# Patient Record
Sex: Female | Born: 1955 | Race: Black or African American | Hispanic: No | Marital: Single | Smoking: Current every day smoker
Health system: Southern US, Community
[De-identification: ages and names within clinical notes are randomized; demographics above are authoritative.]

## PROBLEM LIST (undated history)

## (undated) DIAGNOSIS — M1990 Unspecified osteoarthritis, unspecified site: Secondary | ICD-10-CM

## (undated) DIAGNOSIS — D649 Anemia, unspecified: Secondary | ICD-10-CM

## (undated) DIAGNOSIS — I10 Essential (primary) hypertension: Secondary | ICD-10-CM

## (undated) DIAGNOSIS — M810 Age-related osteoporosis without current pathological fracture: Secondary | ICD-10-CM

## (undated) DIAGNOSIS — Z5189 Encounter for other specified aftercare: Secondary | ICD-10-CM

## (undated) DIAGNOSIS — J45909 Unspecified asthma, uncomplicated: Secondary | ICD-10-CM

## (undated) DIAGNOSIS — I639 Cerebral infarction, unspecified: Secondary | ICD-10-CM

## (undated) DIAGNOSIS — F32A Depression, unspecified: Secondary | ICD-10-CM

## (undated) DIAGNOSIS — I712 Thoracic aortic aneurysm, without rupture: Secondary | ICD-10-CM

## (undated) DIAGNOSIS — Z780 Asymptomatic menopausal state: Secondary | ICD-10-CM

## (undated) DIAGNOSIS — K2980 Duodenitis without bleeding: Secondary | ICD-10-CM

## (undated) DIAGNOSIS — R569 Unspecified convulsions: Secondary | ICD-10-CM

## (undated) DIAGNOSIS — E7800 Pure hypercholesterolemia, unspecified: Secondary | ICD-10-CM

## (undated) DIAGNOSIS — F411 Generalized anxiety disorder: Secondary | ICD-10-CM

## (undated) DIAGNOSIS — F329 Major depressive disorder, single episode, unspecified: Secondary | ICD-10-CM

## (undated) DIAGNOSIS — R011 Cardiac murmur, unspecified: Secondary | ICD-10-CM

## (undated) HISTORY — DX: Major depressive disorder, single episode, unspecified: F32.9

## (undated) HISTORY — DX: Encounter for other specified aftercare: Z51.89

## (undated) HISTORY — DX: Cardiac murmur, unspecified: R01.1

## (undated) HISTORY — DX: Anemia, unspecified: D64.9

## (undated) HISTORY — DX: Unspecified asthma, uncomplicated: J45.909

## (undated) HISTORY — DX: Unspecified osteoarthritis, unspecified site: M19.90

## (undated) HISTORY — DX: Essential (primary) hypertension: I10

## (undated) HISTORY — DX: Unspecified convulsions: R56.9

## (undated) HISTORY — DX: Pure hypercholesterolemia, unspecified: E78.00

## (undated) HISTORY — DX: Depression, unspecified: F32.A

## (undated) HISTORY — DX: Duodenitis without bleeding: K29.80

## (undated) HISTORY — DX: Generalized anxiety disorder: F41.1

## (undated) HISTORY — PX: VASCULAR SURGERY: SHX849

## (undated) HISTORY — DX: Age-related osteoporosis without current pathological fracture: M81.0

## (undated) HISTORY — DX: Asymptomatic menopausal state: Z78.0

## (undated) HISTORY — DX: Thoracic aortic aneurysm, without rupture: I71.2

---

## 1999-07-08 ENCOUNTER — Other Ambulatory Visit: Admission: RE | Admit: 1999-07-08 | Discharge: 1999-07-08 | Payer: Self-pay | Admitting: Obstetrics and Gynecology

## 2000-06-28 ENCOUNTER — Encounter: Payer: Self-pay | Admitting: Rheumatology

## 2000-06-28 ENCOUNTER — Encounter: Admission: RE | Admit: 2000-06-28 | Discharge: 2000-06-28 | Payer: Self-pay | Admitting: Rheumatology

## 2001-05-09 ENCOUNTER — Other Ambulatory Visit: Admission: RE | Admit: 2001-05-09 | Discharge: 2001-05-09 | Payer: Self-pay | Admitting: Obstetrics and Gynecology

## 2001-05-15 ENCOUNTER — Encounter: Payer: Self-pay | Admitting: Obstetrics and Gynecology

## 2001-05-15 ENCOUNTER — Encounter: Admission: RE | Admit: 2001-05-15 | Discharge: 2001-05-15 | Payer: Self-pay | Admitting: Obstetrics and Gynecology

## 2001-07-31 ENCOUNTER — Encounter: Payer: Self-pay | Admitting: Endocrinology

## 2001-07-31 ENCOUNTER — Ambulatory Visit (HOSPITAL_COMMUNITY): Admission: RE | Admit: 2001-07-31 | Discharge: 2001-07-31 | Payer: Self-pay | Admitting: Endocrinology

## 2002-07-14 ENCOUNTER — Emergency Department (HOSPITAL_COMMUNITY): Admission: EM | Admit: 2002-07-14 | Discharge: 2002-07-14 | Payer: Self-pay | Admitting: Emergency Medicine

## 2003-02-16 DIAGNOSIS — K2980 Duodenitis without bleeding: Secondary | ICD-10-CM

## 2003-02-16 HISTORY — DX: Duodenitis without bleeding: K29.80

## 2003-02-22 LAB — HM COLONOSCOPY

## 2004-01-16 ENCOUNTER — Ambulatory Visit: Payer: Self-pay | Admitting: Gastroenterology

## 2004-01-29 ENCOUNTER — Ambulatory Visit: Payer: Self-pay | Admitting: Gastroenterology

## 2004-01-29 HISTORY — PX: ESOPHAGOGASTRODUODENOSCOPY: SHX1529

## 2005-10-04 ENCOUNTER — Ambulatory Visit: Payer: Self-pay | Admitting: Endocrinology

## 2005-11-08 ENCOUNTER — Ambulatory Visit: Payer: Self-pay | Admitting: Endocrinology

## 2005-11-10 ENCOUNTER — Ambulatory Visit: Payer: Self-pay | Admitting: Endocrinology

## 2006-09-02 ENCOUNTER — Emergency Department (HOSPITAL_COMMUNITY): Admission: EM | Admit: 2006-09-02 | Discharge: 2006-09-03 | Payer: Self-pay | Admitting: Emergency Medicine

## 2006-09-06 ENCOUNTER — Ambulatory Visit: Payer: Self-pay | Admitting: Endocrinology

## 2006-10-21 ENCOUNTER — Emergency Department (HOSPITAL_COMMUNITY): Admission: EM | Admit: 2006-10-21 | Discharge: 2006-10-21 | Payer: Self-pay | Admitting: Emergency Medicine

## 2006-10-28 ENCOUNTER — Encounter: Payer: Self-pay | Admitting: Endocrinology

## 2006-10-28 DIAGNOSIS — F411 Generalized anxiety disorder: Secondary | ICD-10-CM | POA: Insufficient documentation

## 2006-10-28 DIAGNOSIS — I10 Essential (primary) hypertension: Secondary | ICD-10-CM

## 2006-10-28 DIAGNOSIS — M069 Rheumatoid arthritis, unspecified: Secondary | ICD-10-CM

## 2006-10-28 DIAGNOSIS — J45909 Unspecified asthma, uncomplicated: Secondary | ICD-10-CM | POA: Insufficient documentation

## 2006-10-28 HISTORY — DX: Unspecified asthma, uncomplicated: J45.909

## 2006-10-28 HISTORY — DX: Generalized anxiety disorder: F41.1

## 2006-10-28 HISTORY — DX: Essential (primary) hypertension: I10

## 2006-10-31 ENCOUNTER — Ambulatory Visit: Payer: Self-pay | Admitting: Endocrinology

## 2006-10-31 LAB — CONVERTED CEMR LAB
ALT: 16 units/L (ref 0–35)
AST: 19 units/L (ref 0–37)
Albumin: 4 g/dL (ref 3.5–5.2)
Alkaline Phosphatase: 82 units/L (ref 39–117)
BUN: 9 mg/dL (ref 6–23)
Bacteria, UA: NEGATIVE
Basophils Absolute: 0 10*3/uL (ref 0.0–0.1)
Basophils Relative: 0.3 % (ref 0.0–1.0)
Bilirubin Urine: NEGATIVE
Bilirubin, Direct: 0.1 mg/dL (ref 0.0–0.3)
CO2: 30 meq/L (ref 19–32)
Calcium: 9.3 mg/dL (ref 8.4–10.5)
Chloride: 110 meq/L (ref 96–112)
Cholesterol: 145 mg/dL (ref 0–200)
Creatinine, Ser: 0.8 mg/dL (ref 0.4–1.2)
Crystals: NEGATIVE
Eosinophils Absolute: 0.1 10*3/uL (ref 0.0–0.6)
Eosinophils Relative: 2 % (ref 0.0–5.0)
GFR calc Af Amer: 97 mL/min
GFR calc non Af Amer: 80 mL/min
Glucose, Bld: 101 mg/dL — ABNORMAL HIGH (ref 70–99)
HCT: 35.8 % — ABNORMAL LOW (ref 36.0–46.0)
HDL: 53 mg/dL (ref >39.0)
Hemoglobin: 12 g/dL (ref 12.0–15.0)
Ketones, ur: NEGATIVE mg/dL
LDL Cholesterol: 83 mg/dL (ref 0–99)
Leukocytes, UA: NEGATIVE
Lymphocytes Relative: 31.8 % (ref 12.0–46.0)
MCHC: 33.4 g/dL (ref 30.0–36.0)
MCV: 86.7 fL (ref 78.0–100.0)
Monocytes Absolute: 0.6 10*3/uL (ref 0.2–0.7)
Monocytes Relative: 8.2 % (ref 3.0–11.0)
Mucus, UA: NEGATIVE
Neutro Abs: 4.3 10*3/uL (ref 1.4–7.7)
Neutrophils Relative %: 57.7 % (ref 43.0–77.0)
Nitrite: NEGATIVE
Platelets: 266 10*3/uL (ref 150–400)
Potassium: 3.8 meq/L (ref 3.5–5.1)
RBC / HPF: NONE SEEN
RBC: 4.13 M/uL (ref 3.87–5.11)
RDW: 15.6 % — ABNORMAL HIGH (ref 11.5–14.6)
Sodium: 145 meq/L (ref 135–145)
Specific Gravity, Urine: <=1.005 (ref 1.000–1.03)
TSH: 0.51 microintl units/mL (ref 0.35–5.50)
Total Bilirubin: 0.5 mg/dL (ref 0.3–1.2)
Total CHOL/HDL Ratio: 2.7
Total Protein, Urine: NEGATIVE mg/dL
Total Protein: 7.5 g/dL (ref 6.0–8.3)
Triglycerides: 46 mg/dL (ref 0–149)
Urine Glucose: NEGATIVE mg/dL
Urobilinogen, UA: 0.2 (ref 0.0–1.0)
VLDL: 9 mg/dL (ref 0–40)
WBC: 7.3 10*3/uL (ref 4.5–10.5)
pH: 6 (ref 5.0–8.0)

## 2006-11-07 ENCOUNTER — Ambulatory Visit: Payer: Self-pay

## 2006-11-07 ENCOUNTER — Encounter: Payer: Self-pay | Admitting: Endocrinology

## 2006-11-07 ENCOUNTER — Ambulatory Visit: Payer: Self-pay | Admitting: Internal Medicine

## 2006-11-21 ENCOUNTER — Ambulatory Visit: Payer: Self-pay | Admitting: Internal Medicine

## 2006-12-06 ENCOUNTER — Ambulatory Visit: Payer: Self-pay | Admitting: Cardiothoracic Surgery

## 2006-12-08 ENCOUNTER — Ambulatory Visit: Payer: Self-pay | Admitting: Pulmonary Disease

## 2006-12-09 ENCOUNTER — Ambulatory Visit: Payer: Self-pay | Admitting: Cardiovascular Disease

## 2006-12-12 ENCOUNTER — Ambulatory Visit (HOSPITAL_COMMUNITY): Admission: RE | Admit: 2006-12-12 | Discharge: 2006-12-12 | Payer: Self-pay | Admitting: Cardiothoracic Surgery

## 2006-12-15 ENCOUNTER — Ambulatory Visit: Payer: Self-pay | Admitting: Cardiothoracic Surgery

## 2006-12-21 ENCOUNTER — Encounter: Payer: Self-pay | Admitting: Endocrinology

## 2006-12-26 ENCOUNTER — Ambulatory Visit: Payer: Self-pay | Admitting: Pulmonary Disease

## 2006-12-27 ENCOUNTER — Ambulatory Visit: Payer: Self-pay | Admitting: Cardiovascular Disease

## 2006-12-27 LAB — CONVERTED CEMR LAB
BUN: 16 mg/dL (ref 6–23)
Basophils Absolute: 0.2 10*3/uL — ABNORMAL HIGH (ref 0.0–0.1)
Basophils Relative: 2.1 % — ABNORMAL HIGH (ref 0.0–1.0)
CO2: 29 meq/L (ref 19–32)
Calcium: 9.2 mg/dL (ref 8.4–10.5)
Chloride: 110 meq/L (ref 96–112)
Creatinine, Ser: 0.7 mg/dL (ref 0.4–1.2)
Eosinophils Absolute: 0.2 10*3/uL (ref 0.0–0.6)
Eosinophils Relative: 2.2 % (ref 0.0–5.0)
GFR calc Af Amer: 113 mL/min
GFR calc non Af Amer: 93 mL/min
Glucose, Bld: 91 mg/dL (ref 70–99)
HCT: 35.9 % — ABNORMAL LOW (ref 36.0–46.0)
Hemoglobin: 12.3 g/dL (ref 12.0–15.0)
INR: 0.9 (ref 0.8–1.0)
Lymphocytes Relative: 22.1 % (ref 12.0–46.0)
MCHC: 34.2 g/dL (ref 30.0–36.0)
MCV: 85.3 fL (ref 78.0–100.0)
Monocytes Absolute: 0.8 10*3/uL — ABNORMAL HIGH (ref 0.2–0.7)
Monocytes Relative: 9.6 % (ref 3.0–11.0)
Neutro Abs: 5.4 10*3/uL (ref 1.4–7.7)
Neutrophils Relative %: 64 % (ref 43.0–77.0)
Platelets: 259 10*3/uL (ref 150–400)
Potassium: 3.8 meq/L (ref 3.5–5.1)
Prothrombin Time: 11.5 s (ref 10.9–13.3)
RBC: 4.21 M/uL (ref 3.87–5.11)
RDW: 15.1 % — ABNORMAL HIGH (ref 11.5–14.6)
Sodium: 144 meq/L (ref 135–145)
WBC: 8.5 10*3/uL (ref 4.5–10.5)
aPTT: 36 s — ABNORMAL HIGH (ref 21.7–29.8)

## 2006-12-29 ENCOUNTER — Ambulatory Visit: Payer: Self-pay | Admitting: Cardiovascular Disease

## 2006-12-29 ENCOUNTER — Inpatient Hospital Stay (HOSPITAL_BASED_OUTPATIENT_CLINIC_OR_DEPARTMENT_OTHER): Admission: RE | Admit: 2006-12-29 | Discharge: 2006-12-29 | Payer: Self-pay | Admitting: Cardiovascular Disease

## 2007-01-06 ENCOUNTER — Ambulatory Visit: Payer: Self-pay | Admitting: Pulmonary Disease

## 2007-01-23 ENCOUNTER — Ambulatory Visit: Payer: Self-pay | Admitting: Cardiovascular Disease

## 2007-01-26 ENCOUNTER — Ambulatory Visit: Payer: Self-pay | Admitting: Cardiothoracic Surgery

## 2007-02-14 ENCOUNTER — Ambulatory Visit: Payer: Self-pay | Admitting: Cardiothoracic Surgery

## 2007-03-16 ENCOUNTER — Ambulatory Visit: Payer: Self-pay | Admitting: Cardiothoracic Surgery

## 2007-03-30 ENCOUNTER — Emergency Department (HOSPITAL_COMMUNITY): Admission: EM | Admit: 2007-03-30 | Discharge: 2007-03-30 | Payer: Self-pay | Admitting: Emergency Medicine

## 2007-04-07 ENCOUNTER — Encounter: Payer: Self-pay | Admitting: Endocrinology

## 2007-04-08 ENCOUNTER — Observation Stay (HOSPITAL_COMMUNITY): Admission: EM | Admit: 2007-04-08 | Discharge: 2007-04-11 | Payer: Self-pay | Admitting: Emergency Medicine

## 2007-04-10 ENCOUNTER — Encounter (INDEPENDENT_AMBULATORY_CARE_PROVIDER_SITE_OTHER): Payer: Self-pay | Admitting: Cardiovascular Disease

## 2007-04-13 ENCOUNTER — Ambulatory Visit: Payer: Self-pay | Admitting: Cardiothoracic Surgery

## 2007-04-24 ENCOUNTER — Encounter: Payer: Self-pay | Admitting: Endocrinology

## 2007-05-01 ENCOUNTER — Encounter: Payer: Self-pay | Admitting: Endocrinology

## 2007-05-05 ENCOUNTER — Encounter: Payer: Self-pay | Admitting: Endocrinology

## 2007-05-12 ENCOUNTER — Encounter: Payer: Self-pay | Admitting: Endocrinology

## 2007-05-25 ENCOUNTER — Ambulatory Visit: Payer: Self-pay | Admitting: Endocrinology

## 2007-05-25 DIAGNOSIS — L299 Pruritus, unspecified: Secondary | ICD-10-CM | POA: Insufficient documentation

## 2007-05-29 ENCOUNTER — Ambulatory Visit: Payer: Self-pay | Admitting: Pulmonary Disease

## 2007-05-29 DIAGNOSIS — J449 Chronic obstructive pulmonary disease, unspecified: Secondary | ICD-10-CM | POA: Insufficient documentation

## 2007-05-29 DIAGNOSIS — J4489 Other specified chronic obstructive pulmonary disease: Secondary | ICD-10-CM | POA: Insufficient documentation

## 2007-06-02 ENCOUNTER — Ambulatory Visit: Payer: Self-pay | Admitting: Cardiovascular Disease

## 2007-06-08 ENCOUNTER — Ambulatory Visit: Payer: Self-pay | Admitting: Cardiothoracic Surgery

## 2007-06-08 ENCOUNTER — Encounter: Admission: RE | Admit: 2007-06-08 | Discharge: 2007-06-08 | Payer: Self-pay | Admitting: Cardiothoracic Surgery

## 2007-06-09 ENCOUNTER — Encounter: Payer: Self-pay | Admitting: Endocrinology

## 2007-07-06 ENCOUNTER — Encounter: Payer: Self-pay | Admitting: Endocrinology

## 2007-07-08 ENCOUNTER — Encounter: Payer: Self-pay | Admitting: Endocrinology

## 2007-07-28 ENCOUNTER — Encounter: Payer: Self-pay | Admitting: Endocrinology

## 2007-09-28 ENCOUNTER — Ambulatory Visit: Payer: Self-pay | Admitting: Cardiothoracic Surgery

## 2007-10-27 ENCOUNTER — Encounter: Payer: Self-pay | Admitting: Endocrinology

## 2008-01-29 ENCOUNTER — Telehealth (INDEPENDENT_AMBULATORY_CARE_PROVIDER_SITE_OTHER): Payer: Self-pay | Admitting: *Deleted

## 2008-01-30 ENCOUNTER — Telehealth: Payer: Self-pay | Admitting: Endocrinology

## 2008-04-01 ENCOUNTER — Ambulatory Visit: Payer: Self-pay | Admitting: Endocrinology

## 2008-04-01 DIAGNOSIS — E7800 Pure hypercholesterolemia, unspecified: Secondary | ICD-10-CM

## 2008-04-01 DIAGNOSIS — R569 Unspecified convulsions: Secondary | ICD-10-CM

## 2008-04-01 DIAGNOSIS — I712 Thoracic aortic aneurysm, without rupture, unspecified: Secondary | ICD-10-CM

## 2008-04-01 HISTORY — DX: Unspecified convulsions: R56.9

## 2008-04-01 HISTORY — DX: Thoracic aortic aneurysm, without rupture, unspecified: I71.20

## 2008-04-01 HISTORY — DX: Thoracic aortic aneurysm, without rupture: I71.2

## 2008-04-01 HISTORY — DX: Pure hypercholesterolemia, unspecified: E78.00

## 2008-05-31 ENCOUNTER — Ambulatory Visit: Payer: Self-pay | Admitting: Endocrinology

## 2008-06-06 LAB — CONVERTED CEMR LAB
ALT: 9 units/L (ref 0–35)
AST: 18 units/L (ref 0–37)
Albumin: 3.6 g/dL (ref 3.5–5.2)
Alkaline Phosphatase: 101 units/L (ref 39–117)
BUN: 11 mg/dL (ref 6–23)
Basophils Absolute: 0 10*3/uL (ref 0.0–0.1)
Basophils Relative: 0 % (ref 0.0–3.0)
Bilirubin Urine: NEGATIVE
Bilirubin, Direct: 0.1 mg/dL (ref 0.0–0.3)
CO2: 23 meq/L (ref 19–32)
Calcium: 9.6 mg/dL (ref 8.4–10.5)
Chloride: 109 meq/L (ref 96–112)
Cholesterol: 159 mg/dL (ref 0–200)
Creatinine, Ser: 0.8 mg/dL (ref 0.4–1.2)
Eosinophils Absolute: 0.1 10*3/uL (ref 0.0–0.7)
Eosinophils Relative: 1.1 % (ref 0.0–5.0)
GFR calc non Af Amer: 95.94 mL/min (ref >60)
Glucose, Bld: 92 mg/dL (ref 70–99)
HCT: 34.8 % — ABNORMAL LOW (ref 36.0–46.0)
HDL: 67.6 mg/dL (ref >39.00)
Hemoglobin: 11.4 g/dL — ABNORMAL LOW (ref 12.0–15.0)
Ketones, ur: NEGATIVE mg/dL
LDL Cholesterol: 79 mg/dL (ref 0–99)
Leukocytes, UA: NEGATIVE
Lymphocytes Relative: 21 % (ref 12.0–46.0)
Lymphs Abs: 1.8 10*3/uL (ref 0.7–4.0)
MCHC: 32.7 g/dL (ref 30.0–36.0)
MCV: 77.6 fL — ABNORMAL LOW (ref 78.0–100.0)
Monocytes Absolute: 0.5 10*3/uL (ref 0.1–1.0)
Monocytes Relative: 5.3 % (ref 3.0–12.0)
Neutro Abs: 6.4 10*3/uL (ref 1.4–7.7)
Neutrophils Relative %: 72.6 % (ref 43.0–77.0)
Nitrite: NEGATIVE
Platelets: 236 10*3/uL (ref 150.0–400.0)
Potassium: 4 meq/L (ref 3.5–5.1)
RBC: 4.49 M/uL (ref 3.87–5.11)
RDW: 16.7 % — ABNORMAL HIGH (ref 11.5–14.6)
Sodium: 146 meq/L — ABNORMAL HIGH (ref 135–145)
Specific Gravity, Urine: 1.02 (ref 1.000–1.030)
TSH: 0.64 microintl units/mL (ref 0.35–5.50)
Total Bilirubin: 0.5 mg/dL (ref 0.3–1.2)
Total CHOL/HDL Ratio: 2
Total Protein, Urine: 100 mg/dL
Total Protein: 8 g/dL (ref 6.0–8.3)
Triglycerides: 64 mg/dL (ref 0.0–149.0)
Urine Glucose: NEGATIVE mg/dL
Urobilinogen, UA: 0.2 (ref 0.0–1.0)
VLDL: 12.8 mg/dL (ref 0.0–40.0)
WBC: 8.8 10*3/uL (ref 4.5–10.5)
pH: 7 (ref 5.0–8.0)

## 2008-06-09 ENCOUNTER — Encounter: Payer: Self-pay | Admitting: Endocrinology

## 2008-06-26 ENCOUNTER — Encounter: Payer: Self-pay | Admitting: Endocrinology

## 2008-06-28 ENCOUNTER — Encounter: Payer: Self-pay | Admitting: Cardiovascular Disease

## 2008-06-28 ENCOUNTER — Encounter: Payer: Self-pay | Admitting: Endocrinology

## 2009-03-27 ENCOUNTER — Encounter: Admission: RE | Admit: 2009-03-27 | Discharge: 2009-03-27 | Payer: Self-pay | Admitting: General Practice

## 2009-07-04 ENCOUNTER — Encounter: Payer: Self-pay | Admitting: Cardiovascular Disease

## 2009-07-04 ENCOUNTER — Encounter: Payer: Self-pay | Admitting: Endocrinology

## 2009-08-28 ENCOUNTER — Emergency Department (HOSPITAL_COMMUNITY): Admission: EM | Admit: 2009-08-28 | Discharge: 2009-08-28 | Payer: Self-pay | Admitting: Emergency Medicine

## 2009-09-16 ENCOUNTER — Ambulatory Visit: Payer: Self-pay | Admitting: Endocrinology

## 2009-09-16 ENCOUNTER — Encounter: Payer: Self-pay | Admitting: Endocrinology

## 2009-09-16 LAB — CONVERTED CEMR LAB
ALT: 13 units/L (ref 0–35)
AST: 20 units/L (ref 0–37)
Albumin: 3.8 g/dL (ref 3.5–5.2)
Alkaline Phosphatase: 95 units/L (ref 39–117)
BUN: 10 mg/dL (ref 6–23)
Basophils Absolute: 0 10*3/uL (ref 0.0–0.1)
Basophils Relative: 0.4 % (ref 0.0–3.0)
Bilirubin Urine: NEGATIVE
Bilirubin, Direct: 0.1 mg/dL (ref 0.0–0.3)
CO2: 25 meq/L (ref 19–32)
Calcium: 9.2 mg/dL (ref 8.4–10.5)
Chloride: 111 meq/L (ref 96–112)
Cholesterol: 141 mg/dL (ref 0–200)
Creatinine, Ser: 0.6 mg/dL (ref 0.4–1.2)
Eosinophils Absolute: 0.2 10*3/uL (ref 0.0–0.7)
Eosinophils Relative: 2.2 % (ref 0.0–5.0)
GFR calc non Af Amer: 135.69 mL/min (ref >60)
Glucose, Bld: 73 mg/dL (ref 70–99)
HCT: 35.5 % — ABNORMAL LOW (ref 36.0–46.0)
HDL: 61 mg/dL (ref >39.00)
Hemoglobin: 11.9 g/dL — ABNORMAL LOW (ref 12.0–15.0)
Ketones, ur: NEGATIVE mg/dL
LDL Cholesterol: 69 mg/dL (ref 0–99)
Leukocytes, UA: NEGATIVE
Lymphocytes Relative: 30.6 % (ref 12.0–46.0)
Lymphs Abs: 2.5 10*3/uL (ref 0.7–4.0)
MCHC: 33.7 g/dL (ref 30.0–36.0)
MCV: 81.4 fL (ref 78.0–100.0)
Monocytes Absolute: 0.5 10*3/uL (ref 0.1–1.0)
Monocytes Relative: 5.9 % (ref 3.0–12.0)
Neutro Abs: 5 10*3/uL (ref 1.4–7.7)
Neutrophils Relative %: 60.9 % (ref 43.0–77.0)
Nitrite: NEGATIVE
Platelets: 203 10*3/uL (ref 150.0–400.0)
Potassium: 3.8 meq/L (ref 3.5–5.1)
RBC: 4.36 M/uL (ref 3.87–5.11)
RDW: 16.1 % — ABNORMAL HIGH (ref 11.5–14.6)
Sodium: 144 meq/L (ref 135–145)
Specific Gravity, Urine: >=1.03 (ref 1.000–1.030)
TSH: 0.56 microintl units/mL (ref 0.35–5.50)
Total Bilirubin: 0.2 mg/dL — ABNORMAL LOW (ref 0.3–1.2)
Total CHOL/HDL Ratio: 2
Total Protein, Urine: 100 mg/dL
Total Protein: 7.6 g/dL (ref 6.0–8.3)
Triglycerides: 57 mg/dL (ref 0.0–149.0)
Urine Glucose: NEGATIVE mg/dL
Urobilinogen, UA: 0.2 (ref 0.0–1.0)
VLDL: 11.4 mg/dL (ref 0.0–40.0)
WBC: 8.2 10*3/uL (ref 4.5–10.5)
pH: 6 (ref 5.0–8.0)

## 2009-10-17 ENCOUNTER — Ambulatory Visit: Payer: Self-pay | Admitting: Endocrinology

## 2009-10-17 DIAGNOSIS — D649 Anemia, unspecified: Secondary | ICD-10-CM

## 2009-10-17 HISTORY — DX: Anemia, unspecified: D64.9

## 2009-11-18 ENCOUNTER — Ambulatory Visit: Payer: Self-pay | Admitting: Endocrinology

## 2010-02-15 ENCOUNTER — Emergency Department (HOSPITAL_COMMUNITY)
Admission: EM | Admit: 2010-02-15 | Discharge: 2010-02-15 | Payer: Self-pay | Source: Home / Self Care | Admitting: Emergency Medicine

## 2010-02-18 ENCOUNTER — Ambulatory Visit
Admission: RE | Admit: 2010-02-18 | Discharge: 2010-02-18 | Payer: Self-pay | Source: Home / Self Care | Attending: Endocrinology | Admitting: Endocrinology

## 2010-02-18 DIAGNOSIS — M25569 Pain in unspecified knee: Secondary | ICD-10-CM | POA: Insufficient documentation

## 2010-03-19 NOTE — Letter (Signed)
Summary: [facility]  [facility]   Imported By: Sherian Rein 08/20/2009 14:38:24  _____________________________________________________________________  External Attachment:    Type:   Image     Comment:   External Document

## 2010-03-19 NOTE — Assessment & Plan Note (Signed)
Summary: 1 MO ROV /NWS  #   Vital Signs:  Patient profile:   55 year old female Height:      69 inches (175.26 cm) Weight:      171.50 pounds (77.95 kg) BMI:     25.42 O2 Sat:      99 % Temp:     98 degrees F (36.67 degrees C) Pulse rate:   69 / minute Pulse rhythm:   regular BP sitting:   160 / 92  (right arm) Cuff size:   regular  Vitals Entered By: Jarome Lamas (October 17, 2009 9:28 AM) CC: 1 mo f/up/pt no longer taking tylenol extra strenght or tramcinolone/pb   Primary Provider:  ellison  CC:  1 mo f/up/pt no longer taking tylenol extra strenght or tramcinolone/pb.  History of Present Illness: htn:  she takes and tolerates bp meds well.   anemia:  was again noted on recent labs.  she does not want to have labs drawn today.    Current Medications (verified): 1)  Lasix 40 Mg  Tabs (Furosemide) .... Take 1 By Mouth Qd 2)  Multivitamins   Tabs (Multiple Vitamin) .... Take 1 By Mouth Qd 3)  Tylenol Extra Strength 500 Mg  Tabs (Acetaminophen) .... 2 Tabs Every 4-6hrs As Needed 4)  Keppra 1000 Mg  Tabs (Levetiracetam) .... Bid 5)  Triamcinolone Acetonide 0.1 %  Crea (Triamcinolone Acetonide) .... Three Times A Day As Needed Itching 6)  Cvs Aspirin Ec 81 Mg  Tbec (Aspirin) .... Once Daily 7)  Simvastatin 40 Mg Tabs (Simvastatin) .Marland Kitchen.. 1 Tab At Bedtime 8)  Amlodipine Besylate 5 Mg Tabs (Amlodipine Besylate) .Marland Kitchen.. 1 Once Daily  Allergies (verified): No Known Drug Allergies  Past History:  Past Medical History: Last updated: 04/01/2008 Smoker Menopause @ age 57 THORACIC AORTIC ANEURYSM (ICD-441.2) C O P D (ICD-496) PRURITUS (ICD-698.9) RHEUMATOID ARTHRITIS (ICD-714.0) HYPERTENSION (ICD-401.9) ASTHMA (ICD-493.90) ANXIETY (ICD-300.00)  Review of Systems  The patient denies hematochezia and hematuria.    Physical Exam  General:  normal appearance.   Extremities:  trace right pedal edema and trace left pedal edema.     Impression & Recommendations:  Problem  # 1:  HYPERTENSION (ICD-401.9) needs increased rx  Problem # 2:  ANEMIA (ICD-285.9) Assessment: New  Medications Added to Medication List This Visit: 1)  Losartan Potassium 50 Mg Tabs (Losartan potassium) .Marland Kitchen.. 1 tab once daily  Other Orders: Est. Patient Level III (14782)  Patient Instructions: 1)  here are some tests for blood in the bowels.  please follow the instructions, and return them to the lab. 2)  add losartan 50 mg once daily. 3)  Please schedule a physical appointment in 1 month. Prescriptions: LOSARTAN POTASSIUM 50 MG TABS (LOSARTAN POTASSIUM) 1 tab once daily  #30 x 11   Entered and Authorized by:   Minus Breeding MD   Signed by:   Minus Breeding MD on 10/17/2009   Method used:   [facility] [phone][facility] (retail)       [address]       [place], [place]  [address]       Ph: [phone] or [phone]       Fax: [phone]   RxID:   [id number]

## 2010-03-19 NOTE — Letter (Signed)
[facility]: [facility]   Imported By: Marylou Mccoy 09/09/2009 14:19:11  _____________________________________________________________________  External Attachment:    Type:   Image     Comment:   External Document

## 2010-03-19 NOTE — Assessment & Plan Note (Signed)
Summary: ER FU/ HAD FLUID ON KNEE/ NWS   Vital Signs:  Patient profile:   55 year old female Height:      69 inches (175.26 cm) Weight:      177 pounds (80.45 kg) BMI:     26.23 O2 Sat:      97 % on Room air Temp:     99.5 degrees F (37.50 degrees C) oral Pulse rate:   95 / minute BP sitting:   142 / 72  (right arm) Cuff size:   regular  Vitals Entered By: Brenton Grills CMA Duncan Dull) (February 18, 2010 11:36 AM)  O2 Flow:  Room air CC: ER F/U/ L knee pain, fluid on left knee/aj Is Patient Diabetic? No   Primary Provider:  Amayra Kiedrowski  CC:  ER F/U/ L knee pain and fluid on left knee/aj.  History of Present Illness: pt states 2 weeks of left knee pain, not in the context of an injury.  she was seen in er, where arthrocentesis was done, and vicodin was rx'ed.  since then, pain is slightly improved overall, but persists.    Current Medications (verified): 1)  Lasix 40 Mg  Tabs (Furosemide) .... Take 1 By Mouth Qd 2)  Multivitamins   Tabs (Multiple Vitamin) .... Take 1 By Mouth Qd 3)  Keppra 1000 Mg  Tabs (Levetiracetam) .Marland Kitchen.. 1 Tab Two Times A Day 4)  Cvs Aspirin Ec 81 Mg  Tbec (Aspirin) .... Once Daily 5)  Simvastatin 40 Mg Tabs (Simvastatin) .Marland Kitchen.. 1 Tab At Bedtime 6)  Amlodipine Besylate 5 Mg Tabs (Amlodipine Besylate) .Marland Kitchen.. 1 Once Daily 7)  Losartan Potassium 50 Mg Tabs (Losartan Potassium) .Marland Kitchen.. 1 Tab Once Daily  Allergies (verified): No Known Drug Allergies  Past History:  Past Medical History: Last updated: 04/01/2008 Smoker Menopause @ age 36 THORACIC AORTIC ANEURYSM (ICD-441.2) C O P D (ICD-496) PRURITUS (ICD-698.9) RHEUMATOID ARTHRITIS (ICD-714.0) HYPERTENSION (ICD-401.9) ASTHMA (ICD-493.90) ANXIETY (ICD-300.00)  Review of Systems  The patient denies fever.    Physical Exam  General:  normal appearance.   Msk:  left knee: there is moderate swelling slight tenderness, but no warmth or erythema.  there a an area of ecchymosis at the arthrocentesis site, 1 cm  diameter, medically Additional Exam:  (i reviewed er labs)   Impression & Recommendations:  Problem # 1:  KNEE PAIN, LEFT (ICD-719.46) Assessment Unchanged uncertain etiology  Medications Added to Medication List This Visit: 1)  Oxycodone-acetaminophen 5-325 Mg Tabs (Oxycodone-acetaminophen) .Marland Kitchen.. 1 every 4 hrs as needed for pain  Other Orders: Orthopedic Surgeon Referral (Ortho Surgeon) Est. Patient Level III [id number])  Patient Instructions: 1)  refer to an orthopedic specialist.  you will be called with a day and time for an appointment. Prescriptions: OXYCODONE-ACETAMINOPHEN 5-325 MG TABS (OXYCODONE-ACETAMINOPHEN) 1 every 4 hrs as needed for pain  #50 x 0   Entered and Authorized by:   Minus Breeding MD   Signed by:   Minus Breeding MD on 02/18/2010   Method used:   Print then Give to Patient   RxID:   [id number]    Orders Added: 1)  Orthopedic Surgeon Referral Gaylord Shih Surgeon] 2)  Est. Patient Level III [[address]]

## 2010-03-19 NOTE — Assessment & Plan Note (Signed)
Summary: FOLOW UP FOR RX REFILL-LB   Vital Signs:  Patient profile:   55 year old female Height:      69 inches (175.26 cm) Weight:      174.38 pounds (79.26 kg) BMI:     25.84 O2 Sat:      99 % on Room air Temp:     97.4 degrees F (36.33 degrees C) oral Pulse rate:   52 / minute BP sitting:   148 / 88  (right arm) Cuff size:   regular  Vitals Entered By: Brenton Grills MA (September 16, 2009 8:39 AM)  O2 Flow:  Room air CC: F/U for rx refills/refills on Lasix and Diltiazem/aj Comments Pt is no longer taking Tylenol Extra strenth or using Triamcinolone cream   Primary Provider:  ellison  CC:  F/U for rx refills/refills on Lasix and Diltiazem/aj.  History of Present Illness: the status of at least 3 ongoing medical problems is addressed today: bradycardia:  she has intermittent dizziness. edema:  pt says this has happend since off the lasix. dyslipidemia:  denies chest pain.  she is still on the zocor  Current Medications (verified): 1)  Lasix 40 Mg  Tabs (Furosemide) .... Take 1 By Mouth Qd 2)  Multivitamins   Tabs (Multiple Vitamin) .... Take 1 By Mouth Qd 3)  Tylenol Extra Strength 500 Mg  Tabs (Acetaminophen) .... 2 Tabs Every 4-6hrs As Needed 4)  Keppra 1000 Mg  Tabs (Levetiracetam) .... Bid 5)  Triamcinolone Acetonide 0.1 %  Crea (Triamcinolone Acetonide) .... Three Times A Day As Needed Itching 6)  Cvs Aspirin Ec 81 Mg  Tbec (Aspirin) .... Once Daily 7)  Diltiazem Hcl Er Beads 420 Mg Xr24h-Cap (Diltiazem Hcl Er Beads) .... Take 1 By Mouth Once Daily Physical Is Due No Addtional Refills Until Appt 8)  Simvastatin 80 Mg Tabs (Simvastatin) .... Qhs  Allergies (verified): No Known Drug Allergies  Past History:  Past Medical History: Last updated: 04/01/2008 Smoker Menopause @ age 77 THORACIC AORTIC ANEURYSM (ICD-441.2) C O P D (ICD-496) PRURITUS (ICD-698.9) RHEUMATOID ARTHRITIS (ICD-714.0) HYPERTENSION (ICD-401.9) ASTHMA (ICD-493.90) ANXIETY  (ICD-300.00)  Social History: Reviewed history from 05/31/2008 and no changes required. smoker works [employer]. single  Review of Systems       The patient complains of weight gain.  The patient denies syncope.    Physical Exam  General:  normal appearance.   Lungs:  Clear to auscultation bilaterally. Normal respiratory effort.  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 1 tab at bedtime 2)  Amlodipine Besylate 5 Mg Tabs (Amlodipine besylate) .Marland Kitchen.. 1 once daily  Other Orders: EKG w/ Interpretation (93000) TLB-Lipid Panel (80061-LIPID) TLB-BMP (Basic Metabolic Panel-BMET) (80048-METABOL) TLB-CBC Platelet - w/Differential (85025-CBCD) TLB-Hepatic/Liver Function Pnl (80076-HEPATIC) TLB-TSH (Thyroid Stimulating Hormone) (84443-TSH) TLB-Udip w/ Micro (81001-URINE) Est. Patient Level IV ([address])  Patient Instructions: 1)  blood tests are being ordered for you today.  please call [phone] to hear your test results. 2)  change diltiazem to amlodipine 5 mg once daily. 3)  reduce simvastatin to 40 mg once daily.  4)  Please schedule a physical appointment in 1 month. Prescriptions: AMLODIPINE BESYLATE 5 MG TABS (AMLODIPINE BESYLATE) 1 once daily  #30 x 11   Entered and Authorized by:   Minus Breeding MD   Signed by:   Minus Breeding MD on 09/16/2009   Method used:   [facility] [phone]* (retail)       [address], [place]  [address]       Ph: [phone] or [phone]       Fax: [phone]   RxID:   [id number] SIMVASTATIN 40 MG TABS (SIMVASTATIN) 1 tab at  bedtime  #30 x 11   Entered and Authorized by:   Minus Breeding MD   Signed by:   Minus Breeding MD on 09/16/2009   Method used:   [facility]        [facility] [phone]* (retail)       [address]       [place], [place]  [address]       Ph: [phone] or [phone]       Fax: [phone]   RxID:   [id number]

## 2010-03-19 NOTE — Assessment & Plan Note (Signed)
Summary: 1 MO ROV /NWS #   Vital Signs:  Patient profile:   55 year old female Height:      69 inches Weight:      175.75 pounds O2 Sat:      99 % Temp:     98.1 degrees F oral Pulse rate:   60 / minute BP sitting:   130 / 80  (right arm)  Vitals Entered By: Jarome Lamas (November 18, 2009 11:14 AM) CC: 3 month fl/up/pb   Primary Debanhi Blaker:  ellison  CC:  3 month fl/up/pb.  History of Present Illness: here for regular wellness examination.  she's feeling pretty well in general, and does not drink etoh.     Allergies: No Known Drug Allergies  Past History:  Past Medical History: Last updated: 04/01/2008 Smoker Menopause @ age 49 THORACIC AORTIC ANEURYSM (ICD-441.2) C O P D (ICD-496) PRURITUS (ICD-698.9) RHEUMATOID ARTHRITIS (ICD-714.0) HYPERTENSION (ICD-401.9) ASTHMA (ICD-493.90) ANXIETY (ICD-300.00)  Family History: Reviewed history from 05/31/2008 and no changes required. mother had breast cancer.  Social History: Reviewed history from 05/31/2008 and no changes required. smoker works [employer]. single  Review of Systems  The patient denies fever, weight loss, weight gain, vision loss, decreased hearing, chest pain, syncope, dyspnea on exertion, prolonged cough, headaches, abdominal pain, melena, hematochezia, severe indigestion/heartburn, hematuria, suspicious skin lesions, and depression.    Physical Exam  General:  normal appearance.   Head:  head: no deformity eyes: no periorbital swelling, no proptosis external nose and ears are normal mouth: no lesion seen Neck:  Supple without thyroid enlargement or tenderness.  Chest Wall:  there is a median sternotomy scar Breasts:  sees gyn  Lungs:  Clear to auscultation bilaterally. Normal respiratory effort.  Heart:  Regular rate and rhythm without gallops noted. Normal S1,S2.  there is a 3/5 syst murmur Abdomen:  abdomen is soft, nontender.  no hepatosplenomegaly.   not distended.  no  hernia  Rectal:  sees gyn  Genitalia:  sees gyn  Msk:  muscle bulk and strength are grossly normal.  no obvious joint swelling.  gait is normal and steady  Pulses:  dorsalis pedis intact bilat.  no carotid bruit  Extremities:  no deformity.  no ulcer on the feet.  feet are of normal color and temp.  no edema  Neurologic:  cn 2-12 grossly intact.   readily moves all 4's.   sensation is intact to touch on the feet  Skin:  normal texture and temp.  no visible rash.  not diaphoretic  Cervical Nodes:  No significant adenopathy.  Psych:  Alert and cooperative; normal mood and affect; normal attention span and concentration.     Impression & Recommendations:  Problem # 1:  ROUTINE GENERAL MEDICAL EXAM@HEALTH  CARE FACL (ICD-V70.0)  Medications Added to Medication List This Visit: 1)  Keppra 1000 Mg Tabs (Levetiracetam) .Marland Kitchen.. 1 tab two times a day  Other Orders: Est. Patient 40-64 years ([address])  Preventive Care Screening     gyn is dr stringer   Patient Instructions: 1)  please consider these measures for your health:  minimize alcohol.  do not use tobacco products.  have a colonoscopy at least every 10 years from age 12.  keep firearms safely stored.  always use seat belts.  have working smoke alarms in your home.  see an eye doctor and dentist regularly.  never drive under the influence of alcohol or drugs (including prescription drugs).   2)  please let me know what your  wishes would be, if artificial life support measures should become necessary.  it is critically important to prevent falling down (keep floor areas well-lit, dry, and free of loose objects). 3)  Please schedule a follow-up appointment in 6 months.

## 2010-03-19 NOTE — Letter (Signed)
[facility]   Imported By: Sherian Rein 08/20/2009 14:40:05  _____________________________________________________________________  External Attachment:    Type:   Image     Comment:   External Document

## 2010-03-19 NOTE — Letter (Signed)
Summary: [facility]  [facility]   Imported By: Marylou Mccoy 09/09/2009 14:19:48  _____________________________________________________________________  External Attachment:    Type:   Image     Comment:   External Document

## 2010-04-27 LAB — SYNOVIAL CELL COUNT + DIFF, W/ CRYSTALS
Crystals, Fluid: NONE SEEN
Eosinophils-Synovial: 0 % (ref 0–1)
Lymphocytes-Synovial Fld: 6 % (ref 0–20)
Monocyte-Macrophage-Synovial Fluid: 3 % — ABNORMAL LOW (ref 50–90)
Neutrophil, Synovial: 91 % — ABNORMAL HIGH (ref 0–25)

## 2010-04-27 LAB — PROTEIN, BODY FLUID: Total protein, fluid: 4.9 g/dL

## 2010-04-27 LAB — BODY FLUID CULTURE: Culture: NO GROWTH

## 2010-04-27 LAB — GLUCOSE, SEROUS FLUID: Glucose, Fluid: 85 mg/dL

## 2010-04-27 LAB — GRAM STAIN

## 2010-05-03 LAB — BLOOD GAS, ARTERIAL
Acid-base deficit: 0.7 mmol/L (ref 0.0–2.0)
Bicarbonate: 23 mEq/L (ref 20.0–24.0)
Drawn by: 257701
FIO2: 0.21 %
O2 Saturation: 97.3 %
Patient temperature: 98.6
TCO2: 20.9 mmol/L (ref 0–100)
pCO2 arterial: 36.6 mmHg (ref 35.0–45.0)
pH, Arterial: 7.416 — ABNORMAL HIGH (ref 7.350–7.400)
pO2, Arterial: 86.3 mmHg (ref 80.0–100.0)

## 2010-05-21 ENCOUNTER — Encounter: Payer: Self-pay | Admitting: Endocrinology

## 2010-05-21 ENCOUNTER — Ambulatory Visit (INDEPENDENT_AMBULATORY_CARE_PROVIDER_SITE_OTHER): Payer: 59 | Admitting: Endocrinology

## 2010-05-21 VITALS — BP 140/80 | HR 72 | Temp 98.0°F | Ht 69.0 in | Wt 168.0 lb

## 2010-05-21 DIAGNOSIS — I10 Essential (primary) hypertension: Secondary | ICD-10-CM

## 2010-05-21 DIAGNOSIS — I712 Thoracic aortic aneurysm, without rupture: Secondary | ICD-10-CM

## 2010-05-21 NOTE — Patient Instructions (Addendum)
Your next physical is due in 6 months. Please continue th same medications.

## 2010-05-21 NOTE — Progress Notes (Signed)
  Subjective:    Patient ID: Diana Walters, female    DOB: [birth year], 55 y.o.   MRN: [id number]  HPI pt states she feels well in general.  She is down to 2 cigarettes per day.     Review of Systems Denies sob    Objective:   Physical Exam GENERAL: no distress Ext:  No edema.        Assessment & Plan:  Htn, with situational component.

## 2010-06-30 NOTE — Cardiovascular Report (Signed)
NAMEJAXON, Diana Walters NO.:  [id number]   MEDICAL RECORD NO.:  [id number]          PATIENT TYPE:  OIB   LOCATION:  1961                         FACILITY:  [facility]   PHYSICIAN:  Peter C. Eden Emms, MD, FACCDATE OF BIRTH:  Apr 28, 1955   DATE OF PROCEDURE:  12/29/2006  DATE OF DISCHARGE:                            CARDIAC CATHETERIZATION   PROCEDURE:  Coronary arteriography.   INDICATIONS:  Ascending and descending thoracic aneurysm, presurgical  evaluation of coronary arteries.   DESCRIPTION OF PROCEDURE:  Cine catheterization was done using 4-French  catheters from right femoral artery.  The JL six catheter was used to  engage the left main.   FINDINGS:  1. Left main coronary artery was normal.  2. Left anterior descending artery was normal.  3. First and second diagonal branches were normal.  4. Circumflex coronary artery was codominant and normal.  5. The right coronary artery was codominant and normal.   RAO VENTRICULOGRAPHY:  RAO Ventriculography was normal.  EF was 55%-60%.  There was no gradient across the aortic valve and no MR.  Aortic  pressure was 140/50, LV pressure was 140/10 with a post A-wave EDP of  20.   Ascending aortic root aortography showed severe dilatation of the  ascending aorta with significant calcification.  The arch, itself,  actually seemed to be the thinnest area of the aorta   Descending thoracic aortography showed that the patient also had severe  dilatation of the descending thoracic aorta down to the level of the  diaphragm, at which point, the aorta resumed normal caliber.   IMPRESSION:  The patient has no significant coronary artery disease with  good LV function.  She will need a staged procedure likely with  ascending aortic root replacement with an elephant trunk followed by  descending thoracic aortic repair, further evaluation of the arch  vessels may be needed with CT scanning prior to surgery.   She will follow up  with Dr. Tyrone Sage to discuss her surgery further.      [name]. Eden Emms, MD, [facility]  Electronically Signed    PCN/MEDQ  D:  12/29/2006  T:  12/29/2006  Job:  [id number]   cc:   Sheliah Plane, MD

## 2010-06-30 NOTE — Assessment & Plan Note (Signed)
[facility]                            CARDIOLOGY OFFICE NOTE   JEFF, MCCALLUM                       MRN:          [id number]  DATE:12/27/2006                            DOB:          Nov 27, 1955    Diana Walters returns today for followup.  The last time I saw her she was  quite emotional.  She has an ascending thoracic aortic aneurysm.   It measures 5.5 x 5.6 cm.  There is also a question of descending  thoracic aortic aneurysm, and the patient may need an elephant trunk  procedure.   I had a discussion with Dr. Tyrone Sage who has seen her.  He would like to  proceed with right and left heart catheterization with an aortic root  injection, and then schedule the patient for followup and probable  surgery.  The patient has a better understanding of her problem.  Her  blood pressure seems to be better controlled.  The last time I saw her,  I started her on carvedilol 12.5 b.i.d.   Her review of systems, otherwise, remarkable for no syncope, no chest  pain, no PND or orthopnea, no shortness of breath.   Her current medications include:  1. Lasix 40 a day.  2. Diovan 160/12.5 two tablets a day.  3. Multivitamins.  4. Carvedilol 12.5 b.i.d.   [facility] [address]   Her current exam is a remarkable for a much more calm, middle-aged black  female in no distress.  Weight is 143, blood pressure is 120/70, pulse is 68 and regular.  HEENT:  Unremarkable.  Carotids are normal without bruit, no lymphadenopathy or thyromegaly or  JVP elevation.  LUNGS:  Clear with good diaphragmatic motion, no wheezing.  She,  unfortunately, continues to smoke.  There is an S1, S2 with a systolic ejection murmur.  There is no aortic  insufficiency murmur.  Her PMI is normal.  ABDOMEN:  Benign.  Bowel sounds positive, no triple A,  hepatosplenomegaly, hepatojugular reflux, no tenderness.  NEURO:  Nonfocal.  No muscular weakness.  SKIN:  Warm and dry.   Baseline EKG shows sinus rhythm with limb lead voltage for left  ventricular hypertrophy.   IMPRESSION:  1. Ascending and descending thoracic aortic aneurysm.  Possible need      for staged procedure depending on arch vessel morphology.  Right      and left heart catheterization to be done with aortic root.  I will      try to arrange this with one of my partners on Thursday or Friday      in the [facility] lab.  She will then [facility]  2. Hypertension, currently better controlled, continue Diovan and      carvedilol.  3. Smoking.  The patient understands the importance of quitting      smoking, particularly prior to open heart surgery.  She will get      baseline pulmonary function tests and continue her attempts.  4. Anxiety, currently much better controlled.  Continue p.r.n. valium.  5. Fibromyalgia, stable,  previously on Lyrica, but this has been      stopped.  Follow with Dr. Kellie Simmering from rheumatology.   I believe the patient is also on Humira, or has been in the past.  I  will have to look into the immunosuppressive mechanism of this agent,  but I do not think it would delay any of her thoracic surgery.     [name]. Eden Emms, MD, [facility]  Electronically Signed    PCN/MedQ  DD: 12/27/2006  DT: 12/28/2006  Job #: [id number]

## 2010-06-30 NOTE — Assessment & Plan Note (Signed)
OFFICE VISIT   Diana Walters, Diana Walters  DOB:  Jan 05, 1956                                        March 16, 2007  CHART #:  [id number]   Diana Walters has been considered for replacement of ascending aorta and  descending aorta and arch.   When she was last seen she was agreeable with consultation with Dr.  _Hughes____at [facility] for consideration of replacement of the ascending  aorta and stent grafting at descending aorta.  The information of her  cath and CT scan had been sent to [facility].  The patient comes in today.  Had not received any information back or an appointment to be seen.  She  continues to be asymptomatic from this. She has remained off her  methotrexate and has not been smoking for almost 8 weeks.   PHYSICAL EXAMINATION:  VITAL SIGNS:  Her blood pressure is 142/67, pulse  is 77, respiratory rate is 18, O2 saturation is 99%.  LUNGS:  Clear.  CARDIAC EXAM:  Is unchanged.  I do not appreciate a murmur of aortic  insufficiency.   While the patient was in the office we did contact Dr. ____Hughes__  office.  A package for her appointment had been mailed to her. She had  not received it. She does have an appointment on February the 20th.  I  will plan to see her after that, depending on his recommendations.   Sheliah Plane, MD  Electronically Signed   EG/MEDQ  D:  03/16/2007  T:  03/16/2007  Job:  [id number]

## 2010-06-30 NOTE — Assessment & Plan Note (Signed)
OFFICE VISIT   Diana Walters, Diana Walters  DOB:  [birth year]                                        January 26, 2007  CHART #:  [id number]   Diana Walters returns to the office today in followup after her previous  consultation and evaluation for diffuse thoracic aortic disease.  She  has a question about bicuspid aortic valve without significant aortic  insufficiency.  Dilated ascending aorta to 5.5 to 5.6.  Dilated  descending aorta to just above the diaphragm in the 4.4 range.  Patient  has known rheumatoid arthritis.  She has been on methotrexate in the  past.  This is being held because of a consideration for proceeding with  ascending aortic and arch replacement and possible stent graft placement  as a followup to the descending aorta.  The patient has been a heavy  smoker.  On her previous visit, she has been encouraged to stop.  Today,  she notes that she stopped smoking 3 days ago.  Overall, the idea of  surgery, she is more comfortable with.  She was not nearly as upset as  she was on previous visits.   PHYSICAL EXAMINATION:  On examination today, her blood pressure is  156/68, pulse is 62, respiratory rate is 18, O2 Sats 99%.  Her lungs are  without wheezing.  Cardiac exam is unchanged.  She has no pedal edema.   I I have again reviewed the CT scans; recent cardiac catheterization,  which shows no significant coronary artery disease.   I discussed in detail, the staged procedure.  I have encouraged her to  continue not smoking.  I have also discussed with her the magnitude of  the operations and risks involved.  I have offered her the option of a  referral to the [facility] in [place] for surgery.  She does not  really wish to pursue this, though was willing to have me send films for  outside review.  I will plan  this.  She will continue on her efforts not to smoke.  I will see her  back in 2 to 3 weeks and consider proceeding with surgery in late  January or early February.   Sheliah Plane, MD  Electronically Signed   EG/MEDQ  D:  01/26/2007  T:  01/26/2007  Job:  [id number]   cc:   [name]. Eden Emms, MD, [facility]

## 2010-06-30 NOTE — Discharge Summary (Signed)
Diana Walters, JEWEL NO.:  [id number]   MEDICAL RECORD NO.:  [id number]          PATIENT TYPE:  OBV   LOCATION:  3738                         FACILITY:  [facility]   PHYSICIAN:  Ricki Rodriguez, M.D.  DATE OF BIRTH:  Aug 24, 1955   DATE OF ADMISSION:  04/08/2007  DATE OF DISCHARGE:  04/11/2007                               DISCHARGE SUMMARY   FINAL DIAGNOSES:  1. Syncope.  2. Sinus bradycardia.  3. Hypokalemia.  4. Ascending aortic aneurysm.  5. Tobacco use disorder.  6. Anxiety.   DISCHARGE MEDICATIONS:  Cardizem 360 mg one daily, Lasix 20 mg daily,  Diovan HCT 160/12.5 mg daily, Xanax 0.25 mg twice daily, Carvedilol  3.125 mg daily, Vicodin 5/500 mg one 3 times daily, potassium 10 mEq one  twice daily, and aspirin 325 mg one daily.   DISCHARGE DIET:  Low-sodium heart-healthy diet.   ACTIVITY:  The patient to walk with assistance.   SPECIAL INSTRUCTION:  The patient to stop any activity that causes chest  pain, shortness of breath, dizziness, sweating, or excessive weakness.   Followup with Dr. Orpah Cobb in 1 month.  The patient to call [phone]  for an appointment and by [facility] as arranged.   HISTORY:  This 55 year old black female presented with passing out spell  after missing a breakfast and lunch.  The patient denies any chest pain,  weakness, speech problem, or visual disturbance.   PHYSICAL EXAMINATION:  VITAL SIGNS:  Temperature 98.1, pulse 85,  respirations 16, blood pressure 158/78, and oxygen saturation 99%.  GENERAL:  The patient is averagely built and nourished.  HEENT: The patient is normocephalic and atraumatic with Parcell eyes.  Pupils equally reactive to light.  Extraocular movements intact.  NECK:  No JVD.  LUNGS:  Clear.  HEART:  Normal.  S1 and S2.  ABDOMEN:  Soft.  EXTREMITIES:  No edema, cyanosis, or clubbing.  SKIN:  Warm and dry.  NEUROLOGIC:  The patient moves all four extremities.   LABORATORY DATA:  Normal  hemoglobin/hematocrit, WBC count, and platelet  count.  Near normal electrolytes, BUN, creatinine, and glucose.  Myoglobin, CK-MB, and troponin I near normal.  EKG revealed sinus  bradycardia.  Echocardiogram revealed normal LV systolic function with a  mildly calcific aortic valve and mild aortic valve regurgitation, mild  aortic root dilatation with a moderate ascending aorta dilatation.   HOSPITAL COURSE:  The patient was admitted to telemetry.  Her  medications initially were continued when she had her severe  bradyarrhythmias.  Her medication dosages were decreased.  She had 2-D  echocardiogram that failed to show any significant valvular disease to  account for a syncope.  After adjusting her medication doses, the  patient's heart rate improved and on April 11, 2007, she was  discharged home in satisfactory condition with instructions to not to  miss meals and take medications regularly.      Ricki Rodriguez, M.D.  Electronically Signed     ASK/MEDQ  D:  05/18/2007  T:  05/19/2007  Job:  [id number]

## 2010-06-30 NOTE — Assessment & Plan Note (Signed)
OFFICE VISIT   TAMBER, BURTCH  DOB:  1955/03/24                                        December 15, 2006  CHART #:  [id number]   The patient was originally seen in the office at the request of Dr.  Everardo All on December 06, 2006, and after reviewing her echo and CT scan  with the aorta dilated to 5.5 to 5.6, I discussed with her the need for  ascending aortic replacement and possibly in the future stenting or  surgical repair of the descending aorta.  The patient has been a long-  term smoker, and, at that time, she was encouraged to stop smoking.  In  addition, we made her a referral to [name] for consideration  of cardiac catheterization, and she was seen by Dr. Eden Emms.  I also have  discussed the case with Dr. Vassie Loll from pulmonary who has seen her and had  arranged for her to have pulmonary function studies done which were  completed.  The patient returns to the office today for further  discussion of her aortic problem and to review the current findings.  Unfortunately, she is still smoking but notes that she has markedly  decreased, and after discussion today, she and her daughter both say  that she will not smoke anymore.   PHYSICAL EXAMINATION:  VITAL SIGNS:  On exam today, her blood pressure  is 125/65, pulse 58, respiratory rate 18, O2 saturation 99%.  LUNGS:  Clear.  HEART:  I do not appreciate any murmur of aortic insufficiency.  She has no pedal edema.   She has been on both prednisone and Humira in the past but not currently  on these.  She is on chronic methotrexate.  The pulmonary function  studies were performed and showed an FEV1 of 2, MVD which is normal and  diffusion capacity which is high for measured volumes.   She has a return appointment to see Dr. Eden Emms, and she will discuss  with him that she wishes to proceed with aortic sugery sometime in the .  near future, and we will plan to have him perform cardiac  catheterization with attention to the ascending and arch.  After this  has been scheduled, she is to notify my office so I can review the  films, and we will see her in the office following catheterization to  further go over the exact surgical procedure needed and timing of  surgery.  The  patient is much more relaxed than she was on the first visit and is  willing to proceed with treatment plan.   Sheliah Plane, MD  Electronically Signed   EG/MEDQ  D:  12/15/2006  T:  12/16/2006  Job:  [id number]   cc:   Aundra Dubin, M.D.  Sean A. Everardo All, MD  [name] Eden Emms, MD, [facility]

## 2010-06-30 NOTE — Consult Note (Signed)
NEW PATIENT CONSULTATION   Smeltzer, Laquonda L  DOB:  09-Nov-1955                                        December 06, 2006  CHART #:  [id number]   PRIMARY CARE PHYSICIAN:  Gregary Signs A. Everardo All, M.D.   RHEUMATOLOGIST:  Aundra Dubin, M.D.   REASON FOR CONSULTATION:  Ascending and descending aortic aneurysm.   BRIEF HISTORY:  Patient comes to the office today after having a CT scan  of the chest and being referred by Dr. Everardo All.  She notes that she had  gone in for a regular checkup and was told that she had some scarring of  the lungs and a CT scan was ordered.  Subsequently, she was told that  she had something wrong with her heart, an echocardiogram was done.  She  was referred to [facility] pulmonology, but the appointment was canceled  and she was referred to cardiac surgery for evaluation of findings based  on the CT scan of the chest.   The patient has had no previous cardiac history.  She denies anginal or  chest pain.  She does note one visit, several months ago, to the  emergency room, when she had a syncopal episode, was told she had low  blood sugar and was discharged home.  She has a long history of treated  hypertension.  Denies hyperlipidemia, denies diabetes, has had no  previous stroke.   The patient's father was murdered.  The patient's mother died at age 72  of breast cancer and congestive heart failure.  She  is an only child.   PAST MEDICAL HISTORY:  Significant for an eight to nine-year history of  rheumatoid arthritis, treated with various drugs, including methotrexate  and Humira currently.  She had been on prednisone 20 mg a day and was  stopped by Dr. Kellie Simmering two to three months ago.  She denies any previous  surgery.   SOCIAL HISTORY:  Patient is employed as a Merchandiser, retail at [employer].  She  lives alone.  She is a smoker and has smoked at least one pack a day  since the death of her mother, 16 years ago.   MEDICATIONS:  Medications listed  include:  1. Lasix 40 mg a day.  2. Cardizem extended release 420 mg a day.  3. Diovan/HCT 160/12.5 two tablets once a day.  4. Humira 40 mg every two weeks.  5. Methotrexate 2.5 mg tablets three q.a.m. and three q.p.m.   ALLERGIES:  None.   REVIEW OF SYSTEMS:  CARDIAC:  Negative for chest pain, resting shortness  of breath, exertional shortness of breath, orthopnea, presyncope.  She  has had a syncopal episode, as noted above.  Denies palpitations and  lower extremity edema.  GENERAL REVIEW OF SYSTEMS:  The patient's weight has been stable.  Denies any vision changes.  Denies chest pain, palpitations.  Denies  wheezing or hemoptysis or dyspnea on exertion.  Denies hematochezia,  dysphagia or melena.  Denies renal stones.  MUSCULOSKELETAL:  Does have diffuse joint pains, especially in the  wrists, knees and ankles.  Has a history of depression.  Denies allergies.  Other review of systems are negative.   Blood pressure today is 148/74, pulse is 88, respiratory rate is 18, O2  sat is 98% on room air.  She is 5 feet 8 inches tall,  weighs 147 pounds.  Patient appears in reasonable health, awake, alert.  Neurologic:  Intact.  Pupils were equal, round and reactive to light.  Neck is  without carotid bruits.  She does have prominent venous engorgement over  the left upper chest.  She has no axillary or cervical lymph nodes.  Lungs are clear bilaterally.  Cardiac exam reveals regular rate and  rhythm without murmur or gallop.  Abdominal exam is benign without  palpable masses.  Lower extremities:  She has tenderness in her left  knee and mild swelling.  She has +2 DP and PT pulses bilaterally.   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  The ascending aorta measures 5.5 by 5.6 cm in  the mid-ascending.  The transverse arch measures 3.9 cm.  The proximal  descending  4.3, increasing to 4.4 by 4.8 in the distal descending aorta  and then just above the diaphragmatic hiatus, tapers to 2.5.  There is a  pulmonary parenchymal pattern, suggestive of interstitial pneumonitis.   Echocardiogram was performed, which showed mildly dilated left  ventricle, left ventricular end diastolic dimension is 40 mm, aortic  root 36 mm, ascending aorta 46 mm.  Aortic valve report raises the issue  of a fused raphe, but then reports the aortic valve was trileaflet.  There is moderate aortic root dilatation, mild ascending aortic  dilatation on the report, although the CT scan shows this to be 5.6 cm.   IMPRESSION:  Patient with a diffusely enlarged aorta from the ascending  aorta, narrowing some in the arch, and then again a significant size  down to just above the hiatus.  I do not detect any significant aortic  insufficiency.  At this point, I do not know if the patient has coronary  artery disease.  With the size of her ascending aorta at just over 5.5  cm, at age 55, it is likely that the patient will require replacement of  the ascending aorta, possibly the arch, with an elephant trunk, and the  descending aorta in the future.  I have reviewed this diagnosis with her  and have made the recommendation that we realign her with a  pulmonologist to check out the possibility of rheumatoid lung disease,  obtain pulmonary function studies.  I have counseled her to immediately  stop smoking and will arrange for her to have a cardiologist.   At this point, the patient is very upset about having a major diagnosis  and was very apprehensive about proceeding with any further treatment.  I have encouraged her to keep her appointments and will plan to see her  back in approximately one week.  She will need a cardiac  catheterization, but will wait and establish rapport with cardiology  before arranging a cardiac catheterization at this point.  She may not  be willing to proceed  with any operation.  However, I have reviewed with  her the risks and dangers of aortic rupture and/or dissection.   Sheliah Plane, MD  Electronically Signed   EG/MEDQ  D:  12/06/2006  T:  12/07/2006  Job:  [id number]   cc:   Aundra Dubin, M.D.  Sean A. Everardo All, MD  Oretha Milch, MD  Pricilla Riffle, MD, [facility]

## 2010-06-30 NOTE — Assessment & Plan Note (Signed)
[facility]                            CARDIOLOGY OFFICE NOTE   Diana Walters, Diana Walters                       MRN:          [id number]  DATE:01/23/2007                            DOB:          [birth year]    [name] returns today for follow up. She has a thoracic aortic aneurysm  measuring over 5.5 cm.  It involves fusiform dilatation of ascending and  descending thoracic aorta. She had her heart catheterized December 29, 2006. She had no significant coronary artery disease. EF was 55-60%.  Post A wave EDP was 20. She had aortography showing dilatation of the  ascending aorta with significant calcification. The arch slimmed back  down, and then she had severe dilatation of the descending thoracic  aorta down to the diaphragm. I had talked about her case with Dr.  Tyrone Sage already. She initially saw him for consultation October 30. She  may need an elephant trunk procedure with staged thoracic aortic repair.  I am not sure why she has not followed up with him post catheterization  to schedule the surgery. The blood pressure has been under good control.  She continues to get occasional episodes of diaphoresis. These seem  nondescript. Her blood pressure is under control. There has been no  excessive bradycardia or hypotension. She has been compliant with her  medications and not having chest pain.   Currently, she is on:  1. Lasix 40 a day.  2. Diovan HCT 160/12.5.  3. Multivitamins.  4. Carvedilol 12.5 b.i.d.  5. Tiazac 420 a day.   Her exam is remarkable for a thin, black female in no distress.  Weight  is 119, blood pressure 140/66, pulse 68 and regular, respiratory rate  14, afebrile.  HEENT:  Unremarkable.  Carotids prominent. No bruits. No lymphadenopathy, thyromegaly or JVP  elevation.  LUNGS:  Clear. Good diaphragmatic motion. No wheezing.  S1/S2. I cannot hear any aortic insufficiency. PMI forceful but not  enlarged.  ABDOMEN:  Is benign.  Abdominal aorta palpable but not enlarged or  tender. No hepatosplenomegaly. No hepatojugular reflux. No bruits.  Distal pulses are intact. No edema. Calf site well healed without  residual bruit or hematoma.  NEUROLOGICAL:  Nonfocal.  SKIN:  Warm and dry.   Baseline EKG shows voltage criteria for LVH.   IMPRESSION:  1. Hypertension. Currently well controlled. Continue current      medicines, low-salt diet.  2. Thoracic aneurysm. Follow with Dr. Tyrone Sage. Surgery should ensue      in the near future. Probably will need a staged procedure with an      ascending thoracic repair leaving an elephant trunk for staged      descending thoracic repair.  3. Previous lower extremity edema, currently improved on current dose      of Lasix and hydrochlorothiazide. Continue low salt diet.  4. History of fibromyalgia and rheumatoid arthritis. Continue p.r.n.      pain relief with nonsteroidals.  5. Smoking. The patient has stopped smoking since her heart      catheterization. She needs to continue to be abstinent. This  will      help her prior to any surgery that she has.   She will talk about this further with Dr. Tyrone Sage as well.     [name]. Eden Emms, MD, [facility]  Electronically Signed    PCN/MedQ  DD: 01/23/2007  DT: 01/24/2007  Job #: [id number]

## 2010-06-30 NOTE — Assessment & Plan Note (Signed)
[facility]                             PULMONARY OFFICE NOTE   Diana Walters, Diana Walters                       MRN:          [id number]  DATE:01/06/2007                            DOB:          1955/09/06    Diana Walters is a 55 year old smoker who has recently been diagnosed with  an ascending aortic aneurysm measuring 5.5 cm x 5.6 cm with dilatation  of the aortic root to 46 mm, and an obstructed left brachiocephalic  vein.  Surgery is being planned for this in the near future.  A CT chest  has shown some interstitial prominence in both lower lobes.  She smoked  about a pack per day, but has recently cut down to about 4 cigarettes  per day.  PFTs have suggested mild restriction with an SVCS 68%.  TLC,  however, was preserved at 89%.  Diffusion capacity was mildly decreased  at 70%, but corrected for alveolar volume.  There was no evidence of  airway obstruction with an FEV1:FVC ratio of 84, and FEV1 of 76%.  Serology in the past has shown a positive RA factor, and a negative ANA,  which is consistent with a known diagnosis of rheumatoid arthritis.  She  has been taken off methotrexate now with the anticipation of surgery.   CURRENT MEDICATIONS:  Include:  1. Lasix 40 mg daily.  2. Diovan HCT 160/12.5 mg daily.  3. Carvedilol 12.5 mg 1/2 tablet daily.  4. Tiazac 40 mg daily.   EXAMINATION:  Weight 154 pounds.  Temperature 97.6.  Blood pressure  124/80.  Heart rate 66 per minute.  Her oxygen saturation is 97% on room  air.  HEENT:  No postnasal drip.  CV:  S1 and S2 normal.  No murmur.  CHEST:  Clear to auscultation.   IMPRESSION:  1. The differential diagnosis of this form of interstitial lung      disease in this smoker would include early manifestation of      rheumatoid lung, methotrexate-induced lung disease, or less likely      smoking-related interstitial lung disease (respiratory      bronchiolitis).  2. Ascending aortic aneurysm.   RECOMMENDATIONS:  1. Smoking cessation was once again emphasized.  Some literature      regarding Chantix was given to her.  She will try to quit prior to      surgery.  2. Her pulmonary function does not contraindicate repair of the      aneurysm.  I think it is too early for me to pursue a biopsy to      determine the reason for her pulmonary fibrosis.  I think      this can be followed by serial imaging and pulmonary function      studies every 6 months or so.     Oretha Milch, MD  Electronically Signed    RVA/MedQ  DD: 01/06/2007  DT: 01/07/2007  Job #: [id number]   cc:   [name]. Eden Emms, MD, [facility]  Sheliah Plane, MD  Aundra Dubin, M.D.

## 2010-06-30 NOTE — Assessment & Plan Note (Signed)
OFFICE VISIT   BRITENY, FULGHUM  DOB:  1955-05-24                                        February 14, 2007  CHART #:  [id number]   Patient return to the office today to further discuss the surgical  options of her treatment for her dilated ascending aorta and descending  aorta.  She stayed off methotrexate in preparation for surgery and has  had no flare-up of joint problems in doing so.  She is now completely  off of cigarettes also.   PHYSICAL EXAMINATION:  Blood pressure is 146/70.  Pulse is 88.  Respiratory rate is 80.  O2 sat is 97%.  She has no pedal edema.  Lungs  are clear bilaterally.  She does have an early systolic murmur.  I do  not appreciate any murmur of aortic insufficiency.   After reviewing the films with my partners, I have discussed with her  consultation with Dr. Kizzie Bane and Dr. Jeanice Lim, for consideration of  ascending arch and stent grafting at one sitting with proximal  introduction of a stent graft.  Patient is willing to visit Dr. Kizzie Bane.  I will call his office and make arrangements for him to see her and  review her films.  Otherwise, I will plan to see her back in 3-4 weeks.   Sheliah Plane, MD  Electronically Signed   EG/MEDQ  D:  02/14/2007  T:  02/15/2007  Job:  [id number]

## 2010-06-30 NOTE — Assessment & Plan Note (Signed)
[facility]                            CARDIOLOGY OFFICE NOTE   Diana, Walters                       MRN:          [id number]  DATE:06/02/2007                            DOB:          10/24/1955    HISTORY:  Diana Walters returns today for followup.  The patient was initially  seen on December 27, 2006 for ascending thoracic aortic aneurysm.  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  She was initially referred to Dr. Tyrone Sage.  He  was concerned that she need may need an elephant trunk procedure and  apparently referred her to Dr. Italy Hughes at [facility].  The patient had the  first initial phase of her surgery successfully.  As far as I can tell,  she had the ascending aortic root and arch replaced with a de-branching  procedure of the great vessels.  She needs to return for presumed graft  stenting of the descending thoracic aorta.   Her postop course was complicated by seizures.  Apparently she had an  intraoperative seizure documented by EEG.  She was started on Keppra.  I  think the patient did have an adult onset seizure about a month prior to  this.  She needs neurological followup.   Apparently she was seen at [facility] on April 08, 2007 to  April 11, 2007 by Dr. Algie Coffer.  I have no idea how this happened.   The patient apparently had a passing out spell after missing breakfast  and lunch.  She did not have any chest pain, weakness, speech problems  or visual disturbances.  We were not involved with her care.  Apparently  telemetry showed no significant problems and her echocardiogram showed  no source of embolus.  I will have to pull the x-rays and echocardiogram  from this hospitalization.   The patient has otherwise been doing well.  She wants to go back to work  at [employer] and I think this is fine.  Of course she will have to be  out again for the second part of her  procedure.  She is to see Dr.  Kizzie Bane next Friday.  From a cardiac perspective, she is otherwise  stable.  She is not having chest pain, PND, orthopnea.  Her breathing is  improved.  She had some small effusions perioperatively and has been on  daily Lasix.  She has not had any palpitations or evidence of AFib.   REVIEW OF SYSTEMS:  Otherwise negative.   MEDICATIONS:  1. Lasix 40 a day.  2. Multivitamins.  3. Cardizem 60 q.6 h.  4. Keppra 1 gram q.12 h.  5. Aspirin a day.  6. Diovan 160 a day.  7. Lipitor 80 a day.   PHYSICAL EXAMINATION:  GENERAL:  Remarkable for much less emotional  black female in no distress.  Affect is appropriate.  VITAL SIGNS:  Weight is 156, blood pressure 150/60, pulse 62 and  regular, afebrile.  HEENT:  Unremarkable.  NECK:  She has a transmitted murmur to the left  carotid.  No  lymphadenopathy, thyromegaly or JVP elevation.  LUNGS:  Clear with good diaphragmatic motion.  No wheezing.  HEART:  There is an S1-S2 with a systolic murmur.  She has had a right  axillary approach scar and a sternotomy scar.  PMI is normal.  ABDOMEN:  Benign.  Bowel sounds are positive.  No AAA.  No tenderness,  no hepatosplenomegaly or hepatojugular reflux.  EXTREMITIES:  Distal pulses are intact, no edema.  NEURO:  Nonfocal.  SKIN:  Warm and dry.  No muscular weakness.   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 Italy Hughes at [facility].  Follow up for      descending thoracic aorta stent graft, currently stable.  Continue      blood pressure medication.  Apparent bradycardia.  Avoid beta      blockers.  2. Recent hospitalizations for syncope.  I do not know if this is      related to bradycardia.  Her Carvedilol has been stopped.  She      needs to follow up with neurology given her history of seizures      intraoperatively and to make sure  Keppra is the right medication      for her.  I suspect she will need an outpatient EEG and will try to      make this referral for her next.  3. Hypertension, currently well controlled.  Continue Diovan 160 a      day.  4. Murmurs.  After her follow up procedure, she will probably need      duplexes of her branch vessels to get a baseline to see how they      have been grafted.  She will also need a follow up echocardiogram      in regards to her systolic murmur.   We do have an echo from the hospital on April 10, 2007 which showed  normal LV function with aortic root dilatation.  No significant aortic  stenosis or regurgitation.   I am glad to see Diana Walters is doing better and that she has had the most  dangerous part of her procedure in regards to operation of the arch.  I  look forward to neurology's input and her second surgery with Dr.  Kizzie Bane.  She will go back to work in the interim to try to keep her  benefits.     [name]. Eden Emms, MD, [facility]  Electronically Signed    PCN/MedQ  DD: 06/02/2007  DT: 06/02/2007  Job #: [id number]

## 2010-06-30 NOTE — Assessment & Plan Note (Signed)
[facility]                            CARDIOLOGY OFFICE NOTE   Diana Walters                       MRN:          [id number]  DATE:12/09/2006                            DOB:          04-Sep-1955    Diana Walters is seen today as a new patient.  She is referred by Dr. Everardo All.   The patient has longstanding hypertension.  She was referred for a 2-D  echocardiogram by Dr. Everardo All for a question of valvular heart disease  and murmur.   I actually read the patient's echocardiogram on November 07, 2006.  There was a question of a bicuspid aortic valve.  There was no  significant stenosis or regurgitation, however, her aortic root was  moderately dilated at 46 mm, and I suggested that the patient have a  followup CT or MRI to size it.  This was performed at our office.   The scan was reviewed by myself personally.  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  It did end before the arch.  The transverse aorta measured approximately 3.9 cm.  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   In talking to [facility], she has not had any significant chest pain.  There  is no history of coronary artery disease.  She has not had syncope,  although she had an episode of low blood sugar on September 5th  requiring a hospital visit.   The patient's coronary risk factors include primarily hypertension and  smoking.  In regards to her hypertension, she has had it for over 20  years.  It appears that at times she has been noncompliant with her  therapy, but is currently on good medical therapy.   Sometimes her blood pressure is exacerbated by prednisone that she takes  for rheumatoid.   The patient says she has stopped smoking since she was told about the  aneurysm.   I spent quite a bit of time with her.  She is very emotional.  She has a  hard time looking a physician in the eye.  She was crying  during a good  part of her interview.  I had a heart model with me, and tried to  explain to her what was going on, the diagnosis, its longterm sequela,  and the fact that she likely would need surgery for it.  I told her that  I would refer her to [facility] once we have an idea of the fact that they  want to do surgery.  I will probably need to proceed with heart  catheterization.  There is a possibility that we can clear her  coronaries with cardiac CTA.   REVIEW OF SYSTEMS:  Otherwise, negative.   PAST MEDICAL HISTORY:  Remarkable for:  1. Rheumatoid arthritis.  2. Smoking.  3. Hypertension.  4. Anxiety.  5. Fibromyalgia.  6. Question of asthma.   NKDA   MEDICATIONS:  Include:  1. Lasix 40 a day.  2. Cardizem 240 a day.  3. Diovan 160/25.  4. Humira 40 mg every 2 weeks.  5. Methotrexate 3.25 mg tablets on Monday, and 4 on Thursday.  6. Multivitamins.   The patient has smoked more than a pack a day for many years.  She said  she has not smoked in the last 2 weeks.   FAMILY HISTORY:  Remarkable for mother dying at age 72 of diabetes and  kidney disease, father dying at age 16, was murdered.   The patient works at [employer].  She is single.  She has 1 daughter, and  2 grandchildren who live in [address].  Otherwise, she does not  have any family.  She is sedentary, and does not work out on a regular  basis.  She has not had previous surgery.   EXAMINATION:  Remarkable for an emotional black female in no distress.  She does have nicotine on her breath still.  Weight is 143.  Blood pressure is 140/72.  Pulse 72 and regular.  Respiratory rate 14.  Afebrile.  HEENT:  Normal.  Carotids are bounding.  There is a transmitted murmur.  JVP is normal.  Neck is supple.  There is no thyromegaly or lymphadenopathy.  LUNGS:  Clear to diaphragmatic motion.  No wheezing.  There is an S1 and S2 with a soft systolic murmur.  I cannot hear an  opening snap.  There is no aortic  insufficiency murmur.  PMI is normal.  ABDOMEN:  Benign.  Abdominal aorta is palpable, but not tender.  No  hepatosplenomegaly.  No hepatojugular reflux.  Femorals are +4 bilaterally.  PTs are +3.  There is no lower extremity  edema.  Her baseline electrocardiogram showed sinus rhythm with LVH.   As indicated, I spent quite some time reviewing her echocardiogram and  CT scan.   IMPRESSION:  1. 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  Refer to [facility].      Recommendations from them afterwards in regards to need for heart      catheterization or possible computed tomography angiography to      clear the coronaries.  2. Hypertension in the setting of ascending aortic aneurysm.  Add      Coreg 12.5 mg p.o. b.i.d.  Strip logged in using Dr. Tiajuana Amass to [facility] and [address].  3. History of rheumatoid arthritis.  Continue methotrexate.  Try to      avoid steroids any time near surgery in regards to weakening smooth      muscle and and __________ further.  4. Smoking.  Continue cessation.  Patient may well benefit from the      addition of Wellbutrin both in terms of her anxiety and depression      over the situation, and stopping her smoking.   Further recommendations will be based on the results of her CVTS  evaluation.     Diana Walters. Eden Emms, MD, [facility]  Electronically Signed    PCN/MedQ  DD: 12/09/2006  DT: 12/10/2006  Job #: [id number]

## 2010-06-30 NOTE — Assessment & Plan Note (Signed)
OFFICE VISIT   CAPRI, VEALS  DOB:  02-01-1956                                        September 28, 2007  CHART #:  [id number]   The patient returns to the office today for followup visit after staged  procedure for ascending and descending aneurysm.  She has subsequently  undergone femoral approach to placement of a thoracic stent graft and is  now back at work and doing well.  She has not returned to smoking and  has stayed off methotrexate as her rheumatoid arthritis has remained  stable.   On exam today, her blood pressure is 147/67, pulse is 68, respiratory  rate is 18, and O2 sats 99%.  Her sternum is stable and well healed.  She does have an early systolic murmur, 2/6.  Lung fields are clear  bilaterally.  Her wounds are all well healed.  She has no pedal edema.   No radiographic studies were done today.  She does see Dr. Kizzie Bane in  September with a followup CT scan.  I have not made a return appointment  to see me as she will continue to be followed by Dr. Kizzie Bane in  Cardiology.   Sheliah Plane, MD  Electronically Signed   EG/MEDQ  D:  09/28/2007  T:  09/29/2007  Job:  [id number]

## 2010-06-30 NOTE — Assessment & Plan Note (Signed)
Diana Walters                             PULMONARY OFFICE NOTE   Diana Walters, Diana Walters                       MRN:          [id number]  DATE:12/08/2006                            DOB:          December 07, 1955    HISTORY OF PRESENT ILLNESS:  Ms. Diana Walters is a 55 year old African-American  smoker who presents for evaluation of an abnormal CT chest.  She had an  ER visit for what seems like a hypoglycemic episode.  This prompted a  cardiac workup which was supposedly negative except for an  echocardiogram showing a large aorta.  Hence, a CT chest was profound,  which showed an aneurysm of the ascending thoracic aorta, measuring 5.5  x 5.6 cm.  There was some interstitial prominence with air trapping in  the lower lobes and ill-defined intralobular nodularity in the upper  lung zones, the reason for the possibility of interstitial lung disease.  Hence, she is referred to [place].   On questioning, she denies dyspnea, cough, wheezing.  There is no  history of abnormal x-ray noted in the past.   PAST MEDICAL HISTORY:  1. Hypertension.  2. Rheumatoid arthritis for the last eight years on methotrexate (Dr.      Kellie Simmering).   PAST SURGICAL HISTORY:  None.   ALLERGIES:  None.   CURRENT MEDICATIONS:  1. Lasix 40 mg daily.  2. Cardizem CD 420 mg daily.  3. Diovan/HCTZ 160/12.5 mg 2 tablets daily.  4. Humira 40 mg every 2 weeks.  5. Methotrexate 2.5 mg 3 tablets on Monday and 4 on Thursday.  6. Multivitamin tablets.   SOCIAL HISTORY:  She smokes about a pack per day for the last 13 years.  She is single and has a grown daughter who is 22.  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   FAMILY HISTORY:  Asthma in her brothers and heart disease in her mother  and sister.   REVIEW OF SYSTEMS:  Occasional dry cough.  Loss of appetite.  Anxiety,  depression.  Her joint symptoms are mostly well controlled.   PHYSICAL EXAMINATION:  Weight 145 pounds.  Blood pressure  132/80, heart  rate 82 per minutes, oxygen saturation 99% on room air.  HEENT:  No postnasal drip.  NECK:  Supple.  No JVD.  No lymphadenopathy.  CVS:  S1 and S2 normal.  CHEST:  Clear to auscultation.  ABDOMEN:  Soft and nontender.  NEUROLOGIC:  Nonfocal.  EXTREMITIES:  No edema.   LAB DATA:  WBC count 10.3, hemoglobin 12, platelets 266.  Sodium 145,  potassium 3.8, BUN and creatinine 9 and 0.8.  LFTs were normal.   IMPRESSION:  1. The CT appearance may be suggestive of early interstitial lung      disease.  The faint ground glass kind of appearance is very      suggestive of respiratory bronchiolitis/desquamative interstitial      pneumonitis.  The differential diagnosis, of course, includes      sarcoidosis and other forms of interstitial lung disease.  She      seems to be  asymptomatic from a pulmonary standpoint.  Recommend      full PFTs, including DLCO, will be obtained to estimate lung      function.  Another differential diagnosis includes rheumatoid      arthritis or methotrexate-induced interstitial lung disease.  2. We will look for desaturation with exercise.  3. She was counseled about smoking cessation.  Clearly, this would be      the primary form of treatment for smoking-related respiratory      bronchiolitis.  4. If the pulmonary function is significantly decreased, we will      proceed with further workup, otherwise we will follow her lung      function every 3-6 months.  5. Aortic aneurysm is being evaluated by CT surgery.     Oretha Milch, MD  Electronically Signed    RVA/MedQ  DD: 12/08/2006  DT: 12/09/2006  Job #: [id number]   cc:   Gregary Signs A. Everardo All, MD  [name] Eden Emms, MD, [facility]

## 2010-06-30 NOTE — Assessment & Plan Note (Signed)
OFFICE VISIT   [name], [name]  DOB:  [birth year]                                        June 08, 2007  CHART #:  [id number]   Ms. Diana Walters returns today for follow-up visit after having surgery at  [facility].  She has been referred for a combined ascending aortic arch and  descending aorta replacement as a single stage.  The first age was done,  and the second stage was deferred until later when from the femoral  approach a thoracic stent graft will be placed.   The patient is doing well postoperatively.  She has had no fever,  chills.  Her wounds were all healing well.  She has returned to work  part-time.  She continues to stay off cigarettes.  Her operative course  was complicated by seizures, so she is not driving currently.  She is to  see Dr. Kizzie Bane at [facility].   I plan to see her back in 4-5 months after her second stage of her  procedure.   Sheliah Plane, MD  Electronically Signed   EG/MEDQ  D:  06/08/2007  T:  06/08/2007  Job:  [id number]

## 2010-06-30 NOTE — Assessment & Plan Note (Signed)
OFFICE VISIT   Diana Walters, Diana Walters  DOB:  09-19-1955                                        April 13, 2007  CHART #:  [id number]   Diana Walters comes in to the office today in followup to make sure her  referral and treatment plan stay on course.  She had been referred to  Dr. Kizzie Bane at [facility] to evaluate her for combined ascending aorta arch  replacement, and stent placement in the descending thoracic aorta.  She  notes that she has seen him, and tentatively planned for surgery March  the 8th or 9th.  No further studies were done today, and she is happy  with her referral.  I will plan to see her back following her surgery to  check on her status.  She is to call and make an appointment after she  has had surgery and back in [place].   Sheliah Plane, MD  Electronically Signed   EG/MEDQ  D:  04/13/2007  T:  04/14/2007  Job:  [id number]

## 2010-10-08 ENCOUNTER — Other Ambulatory Visit: Payer: Self-pay | Admitting: Endocrinology

## 2010-11-06 LAB — PROTIME-INR
INR: 1
Prothrombin Time: 13.3

## 2010-11-06 LAB — POCT CARDIAC MARKERS
CKMB, poc: <1 — ABNORMAL LOW
Myoglobin, poc: 212
Operator id: 257131
Troponin i, poc: 0.26 — ABNORMAL HIGH

## 2010-11-06 LAB — BASIC METABOLIC PANEL
CO2: 28
Chloride: 108
Creatinine, Ser: 0.67
GFR calc Af Amer: >60
Glucose, Bld: 95

## 2010-11-06 LAB — URINALYSIS, ROUTINE W REFLEX MICROSCOPIC
Ketones, ur: 15 — AB
Leukocytes, UA: NEGATIVE
Nitrite: NEGATIVE
Protein, ur: NEGATIVE
Urobilinogen, UA: 0.2

## 2010-11-06 LAB — CARDIAC PANEL(CRET KIN+CKTOT+MB+TROPI)
CK, MB: 1.1
Relative Index: 0.5
Relative Index: 0.5

## 2010-11-06 LAB — RAPID URINE DRUG SCREEN, HOSP PERFORMED
Cocaine: NOT DETECTED
Opiates: NOT DETECTED
Tetrahydrocannabinol: POSITIVE — AB

## 2010-11-06 LAB — DIFFERENTIAL
Basophils Absolute: 0
Eosinophils Absolute: 0.1
Eosinophils Relative: 1
Lymphocytes Relative: 15
Lymphs Abs: 1.1
Monocytes Absolute: 0.3

## 2010-11-06 LAB — CBC
HCT: 38.5
Hemoglobin: 12.5
MCV: 83.2
Platelets: 264
RDW: 15.3

## 2010-11-06 LAB — APTT: aPTT: 36

## 2010-11-06 LAB — I-STAT 8, (EC8 V) (CONVERTED LAB)
BUN: 6
Bicarbonate: 19.8 — ABNORMAL LOW
Chloride: 109
pCO2, Ven: 29.1 — ABNORMAL LOW
pH, Ven: 7.441 — ABNORMAL HIGH

## 2010-11-06 LAB — LIPID PANEL
Cholesterol: 177
LDL Cholesterol: 117 — ABNORMAL HIGH
Triglycerides: 52

## 2010-11-06 LAB — POCT PREGNANCY, URINE
Operator id: 257131
Preg Test, Ur: NEGATIVE

## 2010-11-06 LAB — TSH: TSH: 0.482

## 2010-11-06 LAB — POCT I-STAT CREATININE
Creatinine, Ser: 1
Operator id: 257131

## 2010-11-06 LAB — MAGNESIUM: Magnesium: 2.3

## 2010-11-07 ENCOUNTER — Other Ambulatory Visit: Payer: Self-pay | Admitting: Endocrinology

## 2010-11-17 ENCOUNTER — Telehealth: Payer: Self-pay | Admitting: *Deleted

## 2010-11-17 DIAGNOSIS — Z0000 Encounter for general adult medical examination without abnormal findings: Secondary | ICD-10-CM

## 2010-11-17 NOTE — Telephone Encounter (Signed)
Lab orders placed into Epic for upcoming CPX appointment.  

## 2010-11-26 ENCOUNTER — Other Ambulatory Visit: Payer: 59

## 2010-11-27 ENCOUNTER — Encounter: Payer: Self-pay | Admitting: Endocrinology

## 2010-11-27 ENCOUNTER — Ambulatory Visit (INDEPENDENT_AMBULATORY_CARE_PROVIDER_SITE_OTHER): Payer: 59 | Admitting: Endocrinology

## 2010-11-27 ENCOUNTER — Other Ambulatory Visit (INDEPENDENT_AMBULATORY_CARE_PROVIDER_SITE_OTHER): Payer: 59

## 2010-11-27 VITALS — BP 102/62 | HR 69 | Temp 98.2°F | Ht 69.0 in | Wt 169.0 lb

## 2010-11-27 DIAGNOSIS — Z780 Asymptomatic menopausal state: Secondary | ICD-10-CM | POA: Insufficient documentation

## 2010-11-27 DIAGNOSIS — I10 Essential (primary) hypertension: Secondary | ICD-10-CM

## 2010-11-27 DIAGNOSIS — I712 Thoracic aortic aneurysm, without rupture: Secondary | ICD-10-CM

## 2010-11-27 DIAGNOSIS — F17200 Nicotine dependence, unspecified, uncomplicated: Secondary | ICD-10-CM

## 2010-11-27 DIAGNOSIS — E042 Nontoxic multinodular goiter: Secondary | ICD-10-CM

## 2010-11-27 DIAGNOSIS — Z0000 Encounter for general adult medical examination without abnormal findings: Secondary | ICD-10-CM

## 2010-11-27 DIAGNOSIS — R319 Hematuria, unspecified: Secondary | ICD-10-CM | POA: Insufficient documentation

## 2010-11-27 LAB — URINALYSIS, ROUTINE W REFLEX MICROSCOPIC
Leukocytes, UA: NEGATIVE
Nitrite: NEGATIVE
Specific Gravity, Urine: 1.025 (ref 1.000–1.030)
pH: 6 (ref 5.0–8.0)

## 2010-11-27 LAB — CBC WITH DIFFERENTIAL/PLATELET
Basophils Relative: 0.2 % (ref 0.0–3.0)
Eosinophils Relative: 0.9 % (ref 0.0–5.0)
HCT: 36.5 % (ref 36.0–46.0)
Lymphs Abs: 1.8 10*3/uL (ref 0.7–4.0)
MCV: 81.2 fl (ref 78.0–100.0)
Monocytes Absolute: 0.4 10*3/uL (ref 0.1–1.0)
Platelets: 256 10*3/uL (ref 150.0–400.0)
WBC: 9.4 10*3/uL (ref 4.5–10.5)

## 2010-11-27 LAB — BASIC METABOLIC PANEL
BUN: 15 mg/dL (ref 6–23)
Calcium: 9.2 mg/dL (ref 8.4–10.5)
GFR: 116.67 mL/min (ref >60.00)
Glucose, Bld: 88 mg/dL (ref 70–99)
Sodium: 144 mEq/L (ref 135–145)

## 2010-11-27 LAB — HEPATIC FUNCTION PANEL: Albumin: 3.6 g/dL (ref 3.5–5.2)

## 2010-11-27 LAB — LDL CHOLESTEROL, DIRECT: Direct LDL: 74.3 mg/dL

## 2010-11-27 LAB — LIPID PANEL
HDL: 68.5 mg/dL (ref >39.00)
Total CHOL/HDL Ratio: 2

## 2010-11-27 NOTE — Patient Instructions (Addendum)
blood tests are being requested for you today.  please call [phone] to hear your blood and ultrasound test results.  You will be prompted to enter the 9-digit "MRN" number that appears at the top left of this page, followed by #.  Then you will hear the message. please consider these measures for your health:  minimize alcohol.  do not use tobacco products.  have a colonoscopy at least every 10 years from age 57.  Women should have an annual mammogram from age 66.  keep firearms safely stored.  always use seat belts.  have working smoke alarms in your home.  see an eye doctor and dentist regularly.  never drive under the influence of alcohol or drugs (including prescription drugs).   please let me know what your wishes would be, if artificial life support measures should become necessary.   Let's check a bone-density test.  A few days later, please call [phone] to hear your test results.  You will be prompted to enter the 9-digit "MRN" number that appears at the top left of this page, followed by #.  Then you will hear the message.   Let's also check an ultrasound of the thyroid.  you will receive a phone call, about a day and time for an appointment.   Smoking is very dangerous.  It causes many health problems, including heart problems, cancer, emphysema, and death.  Please read the paper i am giving you today.  You can get help by calling 1-800-QUIT-NOW.  It is known that smokers who use quit-smoking medication are more successful quitting than those who don't.  It often takes several tries to quit successfully, so those who have tried should try again.   i would be happy to prescribe you medication to help you quit.   Smoking Cessation This document explains the best ways for you to quit smoking and new treatments to help. It lists new medicines that can double or triple your chances of quitting and quitting for good. It also considers ways to avoid relapses and concerns you may have about quitting,  including weight gain. NICOTINE: A POWERFUL ADDICTION If you have tried to quit smoking, you know how hard it can be. It is hard because nicotine is a very addictive drug. For some people, it can be as addictive as heroin or cocaine. Usually, people make 2 or 3 tries, or more, before finally being able to quit. Each time you try to quit, you can learn about what helps and what hurts. Quitting takes hard work and a lot of effort, but you can quit smoking. QUITTING SMOKING IS ONE OF THE MOST IMPORTANT THINGS YOU WILL EVER DO:  You will live longer, feel better, and live better.   The impact on your body of quitting smoking is felt almost immediately:   Within 20 minutes, blood pressure decreases. Pulse returns to its normal level.   After 8 hours, carbon monoxide levels in the blood return to normal. Oxygen level increases.   After 24 hours, chance of heart attack starts to decrease. Breath, hair, and body stop smelling like smoke.   After 48 hours, damaged nerve endings begin to recover. Sense of taste and smell improve.   After 72 hours, the body is virtually free of nicotine. Bronchial tubes relax and breathing becomes easier.   After 2 to 12 weeks, lungs can hold more air. Exercise becomes easier and circulation improves.   Quitting will lower your chance of having a heart attack,  stroke, cancer, or lung disease:   After 1 year, the risk of coronary heart disease is cut in half.   After 5 years, the risk of stroke falls to the same as a nonsmoker.   After 10 years, the risk of lung cancer is cut in half and the risk of other cancers decreases significantly.   After 15 years, the risk of coronary heart disease drops, usually to the level of a nonsmoker.   If you are pregnant, quitting smoking will improve your chances of having a healthy baby.   The people you live with, especially your children, will be healthier.   You will have extra money to spend on things other than  cigarettes.  FIVE KEYS TO QUITTING Studies have shown that these 5 steps will help you quit smoking and quit for good. You have the best chances of quitting if you use them together: 1. Get ready.  2. Get support and encouragement.  3. Learn new skills and behaviors.  4. Get medicine to reduce your nicotine addiction and use it correctly.  5. Be prepared for relapse or difficult situations. Be determined to continue trying to quit, even if you do not succeed at first.  1. GET READY  Set a quit date.   Change your environment.   Get rid of ALL cigarettes, ashtrays, matches, and lighters in your home, car, and place of work.   Do not let people smoke in your home.   Review your past attempts to quit. Think about what worked and what did not.   Once you quit, do not smoke. NOT EVEN A PUFF!  2. GET SUPPORT AND ENCOURAGEMENT Studies have shown that you have a better chance of being successful if you have help. You can get support in many ways.  Tell your family, friends, and coworkers that you are going to quit and need their support. Ask them not to smoke around you.   Talk to your caregivers (doctor, dentist, nurse, pharmacist, psychologist, and/or smoking counselor).   Get individual, group, or telephone counseling and support. The more counseling you have, the better your chances are of quitting. Programs are available at [employer] and health centers. Call your local health department for information about programs in your area.   Spiritual beliefs and practices may help some smokers quit.   Quit meters are Photographer that keep track of quit statistics, such as amount of "quit-time," cigarettes not smoked, and money saved.   Many smokers find one or more of the many self-help books available useful in helping them quit and stay off tobacco.  3. LEARN NEW SKILLS AND BEHAVIORS  Try to distract yourself from urges to smoke. Talk to someone,  go for a walk, or occupy your time with a task.   When you first try to quit, change your routine. Take a different route to work. Drink tea instead of coffee. Eat breakfast in a different place.   Do something to reduce your stress. Take a hot bath, exercise, or read a book.   Plan something enjoyable to do every day. Reward yourself for not smoking.   Explore interactive web-based programs that specialize in helping you quit.  4. GET MEDICINE AND USE IT CORRECTLY Medicines can help you stop smoking and decrease the urge to smoke. Combining medicine with the above behavioral methods and support can quadruple your chances of successfully quitting smoking. The [place] Food and Drug Administration (FDA) has approved 7  medicines to help you quit smoking. These medicines fall into 3 categories.  Nicotine replacement therapy (delivers nicotine to your body without the negative effects and risks of smoking):   Nicotine gum: Available over-the-counter.   Nicotine lozenges: Available over-the-counter.   Nicotine inhaler: Available by prescription.   Nicotine nasal spray: Available by prescription.   Nicotine skin patches (transdermal): Available by prescription and over-the-counter.   Antidepressant medicine (helps people abstain from smoking, but how this works is unknown):   Bupropion sustained-release (SR) tablets: Available by prescription.   Nicotinic receptor partial agonist (simulates the effect of nicotine in your brain):   Varenicline tartrate tablets: Available by prescription.   Ask your caregiver for advice about which medicines to use and how to use them. Carefully read the information on the package.   Everyone who is trying to quit may benefit from using a medicine. If you are pregnant or trying to become pregnant, nursing an infant, you are under age 65, or you smoke fewer than 10 cigarettes per day, talk to your caregiver before taking any nicotine replacement medicines.    You should stop using a nicotine replacement product and call your caregiver if you experience nausea, dizziness, weakness, vomiting, fast or irregular heartbeat, mouth problems with the lozenge or gum, or redness or swelling of the skin around the patch that does not go away.   Do not use any other product containing nicotine while using a nicotine replacement product.   Talk to your caregiver before using these products if you have diabetes, heart disease, asthma, stomach ulcers, you had a recent heart attack, you have high blood pressure that is not controlled with medicine, a history of irregular heartbeat, or you have been prescribed medicine to help you quit smoking.  5. BE PREPARED FOR RELAPSE OR DIFFICULT SITUATIONS  Most relapses occur within the first 3 months after quitting. Do not be discouraged if you start smoking again. Remember, most people try several times before they finally quit.   You may have symptoms of withdrawal because your body is used to nicotine. You may crave cigarettes, be irritable, feel very hungry, cough often, get headaches, or have difficulty concentrating.   The withdrawal symptoms are only temporary. They are strongest when you first quit, but they will go away within 10 to 14 days.  Here are some difficult situations to watch for:  Alcohol. Avoid drinking alcohol. Drinking lowers your chances of successfully quitting.   Caffeine. Try to reduce the amount of caffeine you consume. It also lowers your chances of successfully quitting.   Other smokers. Being around smoking can make you want to smoke. Avoid smokers.   Weight gain. Many smokers will gain weight when they quit, usually less than 10 pounds. Eat a healthy diet and stay active. Do not let weight gain distract you from your main goal, quitting smoking. Some medicines that help you quit smoking may also help delay weight gain. You can always lose the weight gained after you quit.   Bad mood or  depression. There are a lot of ways to improve your mood other than smoking.  If you are having problems with any of these situations, talk to your caregiver. SPECIAL SITUATIONS OR CONDITIONS Studies suggest that everyone can quit smoking. Your situation or condition can give you a special reason to quit.  Pregnant women/New mothers: By quitting, you protect your baby's health and your own.   Hospitalized patients: By quitting, you reduce health problems and help healing.  Heart attack patients: By quitting, you reduce your risk of a second heart attack.   Lung, head, and neck cancer patients: By quitting, you reduce your chance of a second cancer.   Parents of children and adolescents: By quitting, you protect your children from illnesses caused by secondhand smoke.  QUESTIONS TO THINK ABOUT Think about the following questions before you try to stop smoking. You may want to talk about your answers with your caregiver.  Why do you want to quit?   If you tried to quit in the past, what helped and what did not?   What will be the most difficult situations for you after you quit? How will you plan to handle them?   Who can help you through the tough times? Your family? Friends? Caregiver?   What pleasures do you get from smoking? What ways can you still get pleasure if you quit?  Here are some questions to ask your caregiver:  How can you help me to be successful at quitting?   What medicine do you think would be best for me and how should I take it?   What should I do if I need more help?   What is smoking withdrawal like? How can I get information on withdrawal?  Quitting takes hard work and a lot of effort, but you can quit smoking. FOR MORE INFORMATION Smokefree.gov (http://www.davis-sullivan.com/) provides free, accurate, evidence-based information and professional assistance to help support the immediate and long-term needs of people trying to quit smoking. Document Released:  01/26/2001 Document Re-Released: 07/22/2009 [facility] Patient Information 2011 [place], [place]. (update: i left message on phone-tree:  Ref urol)

## 2010-11-27 NOTE — Progress Notes (Signed)
Subjective:    Patient ID: Diana Walters, female    DOB: Oct 20, 1955, 55 y.o.   MRN: [id number]  HPI here for regular wellness examination.  He's feeling pretty well in general, and says chronic med probs are stable, except as noted below.  Gyn is dr Stefano Gaul, who does mammography. Past Medical History  Diagnosis Date  . HYPERCHOLESTEROLEMIA 04/01/2008  . ASTHMA 10/28/2006  . ANXIETY 10/28/2006  . ANEMIA 10/17/2009  . HYPERTENSION 10/28/2006  . 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 SEIZURE DISORDER 04/01/2008  . Menopause     age 18    Past Surgical History  Procedure Date  . Esophagogastroduodenoscopy 01/29/2004    History   Social History  . Marital Status: Single    Spouse Name: N/A    Number of Children: N/A  . Years of Education: N/A   Occupational History  . Not on file.   Social History Main Topics  . Smoking status: Former Smoker    Types: Cigarettes  . Smokeless tobacco: Not on file  . Alcohol Use: No  . Drug Use: No  . Sexually Active: Not on file   Other Topics Concern  . Not on file   Social History Narrative  . No narrative on file    Current Outpatient Prescriptions on File Prior to Visit  Medication Sig Dispense Refill  . amLODipine (NORVASC) 5 MG tablet TAKE 1 TABLET BY MOUTH EVERY DAY  30 tablet  5  . aspirin ([facility] ASPIRIN EC) 81 MG EC tablet Take 81 mg by mouth daily.        . furosemide (LASIX) 40 MG tablet Take 40 mg by mouth daily.        Marland Kitchen losartan (COZAAR) 50 MG tablet TAKE 1 TABLET BY MOUTH EVERY DAY  30 tablet  5  . Multiple Vitamin (MULTIVITAMIN) tablet Take 1 tablet by mouth daily.        . simvastatin (ZOCOR) 40 MG tablet TAKE 1 TABLET BY MOUTH AT BEDTIME  30 tablet  5  . levETIRAcetam (KEPPRA) 1000 MG tablet Take 1,000 mg by mouth 2 (two) times daily.          No Known Allergies  Family History  Problem Relation Age of Onset  . Cancer Mother     Breast Cancer    BP 102/62  Pulse 69  Temp(Src) 98.2 F (36.8 C) (Oral)  Ht 5'  9" (1.753 m)  Wt 169 lb (76.658 kg)  BMI 24.96 kg/m2  SpO2 99%  Review of Systems  Constitutional: Negative for fever and unexpected weight change.  HENT: Negative for hearing loss.   Eyes: Negative for visual disturbance.  Respiratory: Negative for cough and shortness of breath.   Cardiovascular: Negative for chest pain.  Gastrointestinal: Negative for abdominal pain.  Genitourinary: Negative for dysuria.  Musculoskeletal: Negative for arthralgias.  Skin: Negative for rash.  Neurological: Negative for syncope, numbness and headaches.  Hematological: Does not bruise/bleed easily.  Psychiatric/Behavioral: Negative for dysphoric mood.       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  Right lobe is >> left CHEST WALL: no deformity.  There is a midline healed surgical scar LUNGS:  Clear to auscultation BREASTS:  sees gyn CV: reg rate and rhythm.  There is a moderate systolic murmur  GENITALIA:  sees gyn RECTAL: sees gyn MUSCULOSKELETAL: muscle bulk and strength are grossly normal.  no obvious joint swelling.  gait is normal and steady EXTEMITIES: no deformity.  no ulcer on the feet.  feet are of normal color and temp.  Trace bilat leg edema PULSES: dorsalis pedis intact bilat.  no carotid bruit NEURO:  cn 2-12 grossly intact.   readily moves all 4's.  sensation is intact to touch on the feet SKIN:  Normal texture and temperature.  No rash or suspicious lesion is visible.   NODES:  None palpable at the neck PSYCH: alert, oriented x3.  Does not appear anxious nor depressed.        Assessment & Plan:  Wellness visit today, with problems stable, except as noted.    We spent at least 3 minutes discussing smoking cessation--see below. Pt says she started smoking at age 54.   Pt says she smokes < 1 ppd.  She gets cigarettes for free at work.  She would like to quit, but does not want medication to help her quit.     SEPARATE EVALUATION FOLLOWS--EACH PROBLEM HERE IS NEW, NOT RESPONDING TO TREATMENT, OR POSES SIGNIFICANT RISK TO THE PATIENT'S HEALTH: HISTORY OF THE PRESENT ILLNESS: Hematuria is noted on labs.  Pt denies gross hematuria PAST MEDICAL HISTORY reviewed and up to date today REVIEW OF SYSTEMS: Denies brbpr PHYSICAL EXAMINATION: VITAL SIGNS:  See vs page GENERAL: no distress ABDOMEN: abdomen is soft, nontender.  no hepatosplenomegaly.   not distended.  no hernia LAB/XRAY RESULTS: ua is pos for rbc IMPRESSION: Hematuria, new PLAN: See instruction page

## 2010-11-30 LAB — DIFFERENTIAL
Basophils Absolute: 0
Eosinophils Absolute: 0.1
Eosinophils Relative: 1
Lymphs Abs: 1.4
Monocytes Absolute: 0.7

## 2010-11-30 LAB — URINALYSIS, ROUTINE W REFLEX MICROSCOPIC
Protein, ur: NEGATIVE
Urobilinogen, UA: 1

## 2010-11-30 LAB — POCT I-STAT CREATININE: Operator id: 284251

## 2010-11-30 LAB — CBC
HCT: 35 — ABNORMAL LOW
Hemoglobin: 11.6 — ABNORMAL LOW
MCV: 86.8
Platelets: 273
RDW: 17.2 — ABNORMAL HIGH

## 2010-11-30 LAB — I-STAT 8, (EC8 V) (CONVERTED LAB)
BUN: 13
Bicarbonate: 24.5 — ABNORMAL HIGH
Chloride: 105
Glucose, Bld: 105 — ABNORMAL HIGH
Hemoglobin: 13.3
Sodium: 139

## 2010-11-30 LAB — POCT CARDIAC MARKERS: Operator id: 284251

## 2010-12-01 ENCOUNTER — Ambulatory Visit
Admission: RE | Admit: 2010-12-01 | Discharge: 2010-12-01 | Disposition: A | Discharge status: Home / Self Care | Payer: 59 | Source: Ambulatory Visit | Attending: Endocrinology | Admitting: Endocrinology

## 2010-12-01 DIAGNOSIS — E042 Nontoxic multinodular goiter: Secondary | ICD-10-CM

## 2011-07-03 ENCOUNTER — Other Ambulatory Visit: Payer: Self-pay | Admitting: Endocrinology

## 2011-07-29 ENCOUNTER — Other Ambulatory Visit: Payer: Self-pay | Admitting: Endocrinology

## 2011-09-02 ENCOUNTER — Other Ambulatory Visit: Payer: Self-pay | Admitting: Endocrinology

## 2011-11-19 ENCOUNTER — Other Ambulatory Visit: Payer: Self-pay | Admitting: Endocrinology

## 2011-11-19 NOTE — Telephone Encounter (Signed)
Refill req for Keppra. Pt called to make appt (no answer). Last seen in office on 11/27/10 and med last refilled on 07/03/11.

## 2011-11-19 NOTE — Telephone Encounter (Signed)
This medication is outside the scope of my practice.  She must be getting it from another dr.

## 2011-11-22 NOTE — Telephone Encounter (Signed)
Called Pharmacy and stated Dr. George Hugh note about this med being outside the scope of his practice and Pt must be receiving from elsewhere.

## 2011-12-22 ENCOUNTER — Other Ambulatory Visit: Payer: Self-pay | Admitting: Endocrinology

## 2012-02-24 ENCOUNTER — Other Ambulatory Visit: Payer: Self-pay | Admitting: *Deleted

## 2012-02-24 NOTE — Telephone Encounter (Signed)
FAXED Rx BACK TO [facility] . REFUSED. PATIENT NEEDS TO MAKE APPOINTMENT WITH DR. Everardo All FOR FURTHER REFILLS.

## 2012-03-30 ENCOUNTER — Emergency Department (HOSPITAL_COMMUNITY)
Admission: EM | Admit: 2012-03-30 | Discharge: 2012-03-30 | Disposition: A | Discharge status: Other Acute Inpatient Hospital | Payer: 59 | Attending: Emergency Medicine | Admitting: Emergency Medicine

## 2012-03-30 ENCOUNTER — Emergency Department (HOSPITAL_COMMUNITY): Payer: 59

## 2012-03-30 ENCOUNTER — Encounter (HOSPITAL_COMMUNITY): Payer: Self-pay | Admitting: *Deleted

## 2012-03-30 DIAGNOSIS — I71019 Dissection of thoracic aorta, unspecified: Secondary | ICD-10-CM | POA: Insufficient documentation

## 2012-03-30 DIAGNOSIS — I7101 Dissection of thoracic aorta: Secondary | ICD-10-CM

## 2012-03-30 DIAGNOSIS — E7800 Pure hypercholesterolemia, unspecified: Secondary | ICD-10-CM | POA: Insufficient documentation

## 2012-03-30 DIAGNOSIS — Z7982 Long term (current) use of aspirin: Secondary | ICD-10-CM | POA: Insufficient documentation

## 2012-03-30 DIAGNOSIS — G40909 Epilepsy, unspecified, not intractable, without status epilepticus: Secondary | ICD-10-CM | POA: Insufficient documentation

## 2012-03-30 DIAGNOSIS — J45909 Unspecified asthma, uncomplicated: Secondary | ICD-10-CM | POA: Insufficient documentation

## 2012-03-30 DIAGNOSIS — I71012 Dissection of descending thoracic aorta: Secondary | ICD-10-CM

## 2012-03-30 DIAGNOSIS — Z780 Asymptomatic menopausal state: Secondary | ICD-10-CM | POA: Insufficient documentation

## 2012-03-30 DIAGNOSIS — Z87891 Personal history of nicotine dependence: Secondary | ICD-10-CM | POA: Insufficient documentation

## 2012-03-30 DIAGNOSIS — F411 Generalized anxiety disorder: Secondary | ICD-10-CM | POA: Insufficient documentation

## 2012-03-30 DIAGNOSIS — R000 Tachycardia, unspecified: Secondary | ICD-10-CM | POA: Insufficient documentation

## 2012-03-30 DIAGNOSIS — I10 Essential (primary) hypertension: Secondary | ICD-10-CM | POA: Insufficient documentation

## 2012-03-30 DIAGNOSIS — Z79899 Other long term (current) drug therapy: Secondary | ICD-10-CM | POA: Insufficient documentation

## 2012-03-30 DIAGNOSIS — Z8679 Personal history of other diseases of the circulatory system: Secondary | ICD-10-CM | POA: Insufficient documentation

## 2012-03-30 LAB — URINALYSIS, ROUTINE W REFLEX MICROSCOPIC
Bilirubin Urine: NEGATIVE
Specific Gravity, Urine: 1.02 (ref 1.005–1.030)
Urobilinogen, UA: 1 mg/dL (ref 0.0–1.0)
pH: 6.5 (ref 5.0–8.0)

## 2012-03-30 LAB — CBC WITH DIFFERENTIAL/PLATELET
HCT: 21.2 % — ABNORMAL LOW (ref 36.0–46.0)
Hemoglobin: 7 g/dL — ABNORMAL LOW (ref 12.0–15.0)
Lymphocytes Relative: 6 % — ABNORMAL LOW (ref 12–46)
Lymphs Abs: 1.5 10*3/uL (ref 0.7–4.0)
Monocytes Absolute: 1 10*3/uL (ref 0.1–1.0)
Monocytes Relative: 4 % (ref 3–12)
Neutro Abs: 23 10*3/uL — ABNORMAL HIGH (ref 1.7–7.7)
Neutrophils Relative %: 90 % — ABNORMAL HIGH (ref 43–77)
RBC: 3.18 MIL/uL — ABNORMAL LOW (ref 3.87–5.11)
WBC: 25.6 10*3/uL — ABNORMAL HIGH (ref 4.0–10.5)

## 2012-03-30 LAB — URINE MICROSCOPIC-ADD ON

## 2012-03-30 LAB — BASIC METABOLIC PANEL
CO2: 23 mEq/L (ref 19–32)
Chloride: 99 mEq/L (ref 96–112)
Glucose, Bld: 205 mg/dL — ABNORMAL HIGH (ref 70–99)
Sodium: 133 mEq/L — ABNORMAL LOW (ref 135–145)

## 2012-03-30 LAB — PROTIME-INR
INR: 1.24 (ref 0.00–1.49)
Prothrombin Time: 15.4 seconds — ABNORMAL HIGH (ref 11.6–15.2)

## 2012-03-30 LAB — APTT: aPTT: 35 seconds (ref 24–37)

## 2012-03-30 MED ORDER — HYDROMORPHONE HCL PF 1 MG/ML IJ SOLN
INTRAMUSCULAR | Status: AC
  Filled 2012-03-30: qty 1

## 2012-03-30 MED ORDER — IOHEXOL 300 MG/ML  SOLN
100.0000 mL | Freq: Once | INTRAMUSCULAR | Status: AC | PRN
  Administered 2012-03-30: 100 mL via INTRAVENOUS

## 2012-03-30 MED ORDER — SODIUM CHLORIDE 0.9 % IV SOLN: Freq: Once | INTRAVENOUS | Status: DC

## 2012-03-30 MED ORDER — HYDROMORPHONE HCL PF 1 MG/ML IJ SOLN
1.0000 mg | Freq: Once | INTRAMUSCULAR | Status: AC
  Administered 2012-03-30: 0.5 mg via INTRAVENOUS

## 2012-03-30 MED ORDER — ONDANSETRON HCL 4 MG/2ML IJ SOLN
4.0000 mg | Freq: Once | INTRAMUSCULAR | Status: AC
  Administered 2012-03-30: 4 mg via INTRAVENOUS
  Filled 2012-03-30: qty 2

## 2012-03-30 MED ORDER — HYDROMORPHONE HCL PF 1 MG/ML IJ SOLN
1.0000 mg | Freq: Once | INTRAMUSCULAR | Status: AC
  Administered 2012-03-30: 1 mg via INTRAVENOUS
  Filled 2012-03-30: qty 1

## 2012-03-30 MED ORDER — SODIUM CHLORIDE 0.9 % IV BOLUS (SEPSIS)
1000.0000 mL | Freq: Once | INTRAVENOUS | Status: AC
  Administered 2012-03-30: 1000 mL via INTRAVENOUS

## 2012-03-30 NOTE — ED Notes (Signed)
Pt reports onset of left flank pain and urinary incontinence that started today. Pt yelling out in triage for help.

## 2012-03-30 NOTE — ED Notes (Signed)
Patient is alert and oriented x3.  She is being transferred to [facility] center in critical but stable condition.  She has stable V/S and is being sent with 4 units of blood.  2 units to be infused and 2 for emergency back up.  Report has been called to [facility] to [employer].

## 2012-03-30 NOTE — ED Provider Notes (Signed)
History     CSN: [id number]  Arrival date & time 03/30/12  2002   First MD Initiated Contact with Patient 03/30/12 2013      Chief Complaint  Patient presents with  . Flank Pain    (Consider location/radiation/quality/duration/timing/severity/associated sxs/prior treatment) HPI Comments: 57 y/o female with hx of thoracic aortic aneurysm, seizure d/o and presents with pain in the L side - she is difficult to obtain information from b/c of pain and groaning.  She was diaphoretic in triage.  She states that the pain has been present for 2 weeks but got much worse over the last 24 hours.  Nothing makes better or worse - has no hematuria, no nausea.  Sx are severe.  Patient is a 57 y.o. female presenting with flank pain. The history is provided by the patient.  Flank Pain    Past Medical History  Diagnosis Date  . HYPERCHOLESTEROLEMIA 04/01/2008  . ASTHMA 10/28/2006  . ANXIETY 10/28/2006  . ANEMIA 10/17/2009  . HYPERTENSION 10/28/2006  . 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 SEIZURE DISORDER 04/01/2008  . Menopause     age 89    Past Surgical History  Procedure Laterality Date  . Esophagogastroduodenoscopy  01/29/2004    Family History  Problem Relation Age of Onset  . Cancer Mother     Breast Cancer    History  Substance Use Topics  . Smoking status: Former Smoker    Types: Cigarettes  . Smokeless tobacco: Not on file  . Alcohol Use: No    OB History   Grav Para Term Preterm Abortions TAB SAB Ect Mult Living                  Review of Systems  Genitourinary: Positive for flank pain.  All other systems reviewed and are negative.    Allergies  Review of patient's allergies indicates no known allergies.  Home Medications   Current Outpatient Rx  Name  Route  Sig  Dispense  Refill  . amLODipine (NORVASC) 5 MG tablet      TAKE 1 TABLET BY MOUTH EVERY DAY   30 tablet   5   . aspirin ([facility] ASPIRIN EC) 81 MG EC tablet   Oral   Take 81 mg by mouth daily.            . furosemide (LASIX) 40 MG tablet   Oral   Take 40 mg by mouth daily.           Marland Kitchen KEPPRA 1000 MG tablet      TAKE 1 TABLET BY MOUTH TWICE A DAY   60 tablet   1     Dispense as written.   Marland Kitchen losartan (COZAAR) 50 MG tablet      TAKE 1 TABLET BY MOUTH EVERY DAY   30 tablet   2     Pt needs OV for additional refills. Last OV 10/201 ...   . Multiple Vitamin (MULTIVITAMIN) tablet   Oral   Take 1 tablet by mouth daily.           . simvastatin (ZOCOR) 40 MG tablet      TAKE 1 TABLET BY MOUTH AT BEDTIME   30 tablet   5     BP 103/79  Pulse 116  SpO2 100%  Physical Exam  Nursing note and vitals reviewed. Constitutional: She appears well-developed and well-nourished. No distress.  HENT:  Head: Normocephalic and atraumatic.  Mouth/Throat: Oropharynx is clear and moist. No oropharyngeal  exudate.  Eyes: EOM are normal. Pupils are equal, round, and reactive to light. Right eye exhibits no discharge. Left eye exhibits no discharge. No scleral icterus.  Conj pale  Neck: Normal range of motion. Neck supple. No JVD present. No thyromegaly present.  Cardiovascular: Regular rhythm, normal heart sounds and intact distal pulses.  Exam reveals no gallop and no friction rub.   No murmur heard. tachycardia  Pulmonary/Chest: Effort normal and breath sounds normal. No respiratory distress. She has no wheezes. She has no rales.  Abdominal: Soft. Bowel sounds are normal. She exhibits no distension and no mass. There is no tenderness.  No sig CVA ttp  Musculoskeletal: Normal range of motion. She exhibits no edema and no tenderness.  Lymphadenopathy:    She has no cervical adenopathy.  Neurological: She is alert. Coordination normal.  Skin: Skin is warm and dry. No rash noted. No erythema.  Pale, diaphoretic  Psychiatric: She has a normal mood and affect. Her behavior is normal.    ED Course  Procedures (including critical care time)  Labs Reviewed  URINALYSIS, ROUTINE  W REFLEX MICROSCOPIC - Abnormal; Notable for the following:    APPearance CLOUDY (*)    Glucose, UA 250 (*)    Hgb urine dipstick MODERATE (*)    Protein, ur >300 (*)    All other components within normal limits  URINE MICROSCOPIC-ADD ON  CBC WITH DIFFERENTIAL  APTT  PROTIME-INR  BASIC METABOLIC PANEL  TYPE AND SCREEN  PREPARE RBC (CROSSMATCH)  PREPARE RBC (CROSSMATCH)   Ct Chest W Contrast  03/30/2012  *RADIOLOGY REPORT*  Clinical Data:  Onset of severe left-sided flank pain and urinary incontinence  CT ANGIOGRAPHY CHEST, ABDOMEN AND PELVIS  Technique:  Multidetector CT imaging through the chest, abdomen and pelvis was performed using the standard protocol during bolus administration of intravenous contrast.  Multiplanar reconstructed images including MIPs were obtained and reviewed to evaluate the vascular anatomy.  Contrast: OMNIPAQUE IOHEXOL 300 MG/ML  SOLN  Comparison:  Chest CT - 11/21/2006  CTA CHEST  Vascular Findings:  The patient has undergone stent graft repair of known thoracic aortic aneurysm with multiple overlapping stent grafts throughout near the entirety of the thoracic aorta.  There is an endoleak within the mid aspect of the descending thoracic aorta (image 31, series 2) at a presumed junction of overlapping thoracic aortic stent grafts (type 3 Endo leak).  This finding is associated with acute aortic rupture involving the more cranial aspect of the descending thoracic aorta (image 23, series 2).  Contrast is seen extravasating into the left sided pleural space with high-density contrast and blood seen lying dependently within the more caudal aspect of the pleural space (image 55, series 2).  There is a moderate to large sized left- sided hemothorax with near-complete passive atelectasis/collapse of the left lung.  No significant deviation of the cardiomediastinal structures to the right.  The distal end of the stent graft is not well apposed again the walls of the distal  aspect of the aneurysmal thoracic aorta. Contrast is seen outside the distal end of the stent graft coursing retrograde up the aneurysmal components of the distal thoracic aorta compatible with a distal type 1 B Endo leak. The proximal end of the stent graft appears well seated against the ascending thoracic aorta.  Post de-branching procedure with the great vessels of the aortic arch arising from a single origin from the proximal aspect of the native ascending thoracic aorta.  The branch vessels of  the aortic arch are patent.  Normal heart size.  Coronary artery calcifications.  Calcifications within the aortic valve leaflets.  Review of the MIP images confirms the above findings.  ---------------------------------------------------------------  Nonvascular findings:  There is near complete compressive atelectasis of the right lung secondary to moderate to large sized left-sided pneumothorax.  New right hemithorax is normally aerated.  No pneumothorax.  No definite mediastinal or hilar adenopathy.  Indeterminate bilateral axillary lymphadenopathy with index right axillary node measuring 1 cm in short axis diameter (image 12, series 2 and index left-sided axillary node measuring 1.3 cm (image 6).  IMPRESSION:  1.  Findings compatible with acute aortic rupture presumably secondary to a type 3 Endo leak at the junction of overlapping thoracic aortic stent grafts within the mid aspect of the descending thoracic aorta.  Associated large left-sided hemothorax with near complete passive atelectasis of the left lung. 2.  Type 1 B endoleak about the distal aspect of the descending thoracic aorta.  3.  Indeterminate bilateral axillary lymphadenopathy.  -------------------------------------------------------------  CTA ABDOMEN AND PELVIS  Vascular Findings:  Abdominal aorta:  There is marked angulation of the cranial most aspect of the abdominal aorta at the level of the diaphragmatic hiatus however the abdominal aorta is of  normal caliber at this location.  No hemodynamically significant stenosis.  There is rather extensive calcified atherosclerotic plaque within the distal aspect of the abdominal aorta, not resulting in hemodynamically significant stenosis.  The abdominal aorta is mildly tortuous but of normal caliber.  No abdominal aortic dissection.  There is a minimal amount of contrast which appears external to the calcified plaque within the distal aspect of the abdominal aorta (image 73, series 2).  No periaortic stranding.  Celiac artery:  Widely patent; conventional branching pattern.  SMA:  Widely patent; conventional branching pattern.  Right renal artery: Duplicated; there is a tiny accessory left sided renal artery which supplies the inferior pole right kidney. The prominent right-sided renal artery is widely patent.  Left renal artery:  Duplicated; there is a tiny accessory left sided renal artery which arises from the more caudal aspect of the abdominal aorta to supply the inferior pole of the left kidney. The dominant left-sided renal arteries widely patent without hemodynamically significant stenosis.  IMA:  Patent.  Pelvic vasculature:  There is eccentric calcified plaque within the bilateral common iliac arteries, not resulting in hemodynamically significant stenosis.  The bilateral external iliac arteries are widely patent.  The bilateral internal iliac arteries are diseased but patent and of normal caliber.  Review of the MIP images confirms the above findings.  ------------------------------------------------------------------- ------  Nonvascular findings:  Evaluation of the abdominal organs is limited to the arterial phase of enhancement.  Normal hepatic contour.  No discrete hepatic lesions.  Gallbladder is decompressed but otherwise normal.  No ascites.  There is symmetric enhancement and excretion of the bilateral kidneys.  Note is made of approximately 2.5 cm hypoattenuating (9 HU) cyst within the mid aspect  of the left kidney.  No discrete right-sided renal lesions.  No definite renal stones on the post contrast examination.  No perinephric stranding.  Normal appearance of the bilateral adrenal glands, pancreas and spleen, though note, the spleen and left kidney are caudally displaced secondary to the large left-sided hemothorax.  The bowel is decompressed but otherwise normal.  No evidence of enteric obstruction.  No pneumoperitoneum, pneumatosis or portal venous gas.  No retroperitoneal, mesenteric, pelvic or inguinal lymphadenopathy, though evaluation degraded secondary to lack of  mesenteric fat.  A calcified uterine fibroids.  Small amount of free fluid in the pelvis.  No discrete adnexal lesion.  No acute or aggressive osseous abnormalities.  IMPRESSION: 1.  Extensive calcified atherosclerotic plaque within the distal aspect of a normal caliber abdominal aorta.  2.  There is a possible tiny focus of contrast which appears external to the calcified anterior wall of the distal abdominal aorta however this is without associated adjacent stranding or definitive extravasation (image 73, series 2). While indeterminate this is favored to represent a tiny penetrating atherosclerotic ulcer.  Attention on follow-up examinations is recommended.  3.  Calcified/degenerating uterine fibroids. 4.  Small amount of free fluid in the pelvis, presumably physiologic  Above findings discussed with Dr. Oletta Lamas at 2103.   Original Report Authenticated By: Tacey Ruiz, MD    Ct Abdomen Pelvis W Contrast  03/30/2012  *RADIOLOGY REPORT*  Clinical Data:  Onset of severe left-sided flank pain and urinary incontinence  CT ANGIOGRAPHY CHEST, ABDOMEN AND PELVIS  Technique:  Multidetector CT imaging through the chest, abdomen and pelvis was performed using the standard protocol during bolus administration of intravenous contrast.  Multiplanar reconstructed images including MIPs were obtained and reviewed to evaluate the vascular anatomy.   Contrast: OMNIPAQUE IOHEXOL 300 MG/ML  SOLN  Comparison:  Chest CT - 11/21/2006  CTA CHEST  Vascular Findings:  The patient has undergone stent graft repair of known thoracic aortic aneurysm with multiple overlapping stent grafts throughout near the entirety of the thoracic aorta.  There is an endoleak within the mid aspect of the descending thoracic aorta (image 31, series 2) at a presumed junction of overlapping thoracic aortic stent grafts (type 3 Endo leak).  This finding is associated with acute aortic rupture involving the more cranial aspect of the descending thoracic aorta (image 23, series 2).  Contrast is seen extravasating into the left sided pleural space with high-density contrast and blood seen lying dependently within the more caudal aspect of the pleural space (image 55, series 2).  There is a moderate to large sized left- sided hemothorax with near-complete passive atelectasis/collapse of the left lung.  No significant deviation of the cardiomediastinal structures to the right.  The distal end of the stent graft is not well apposed again the walls of the distal aspect of the aneurysmal thoracic aorta. Contrast is seen outside the distal end of the stent graft coursing retrograde up the aneurysmal components of the distal thoracic aorta compatible with a distal type 1 B Endo leak. The proximal end of the stent graft appears well seated against the ascending thoracic aorta.  Post de-branching procedure with the great vessels of the aortic arch arising from a single origin from the proximal aspect of the native ascending thoracic aorta.  The branch vessels of the aortic arch are patent.  Normal heart size.  Coronary artery calcifications.  Calcifications within the aortic valve leaflets.  Review of the MIP images confirms the above findings.  ---------------------------------------------------------------  Nonvascular findings:  There is near complete compressive atelectasis of the right lung  secondary to moderate to large sized left-sided pneumothorax.  New right hemithorax is normally aerated.  No pneumothorax.  No definite mediastinal or hilar adenopathy.  Indeterminate bilateral axillary lymphadenopathy with index right axillary node measuring 1 cm in short axis diameter (image 12, series 2 and index left-sided axillary node measuring 1.3 cm (image 6).  IMPRESSION:  1.  Findings compatible with acute aortic rupture presumably secondary to a type 3  Endo leak at the junction of overlapping thoracic aortic stent grafts within the mid aspect of the descending thoracic aorta.  Associated large left-sided hemothorax with near complete passive atelectasis of the left lung. 2.  Type 1 B endoleak about the distal aspect of the descending thoracic aorta.  3.  Indeterminate bilateral axillary lymphadenopathy.  -------------------------------------------------------------  CTA ABDOMEN AND PELVIS  Vascular Findings:  Abdominal aorta:  There is marked angulation of the cranial most aspect of the abdominal aorta at the level of the diaphragmatic hiatus however the abdominal aorta is of normal caliber at this location.  No hemodynamically significant stenosis.  There is rather extensive calcified atherosclerotic plaque within the distal aspect of the abdominal aorta, not resulting in hemodynamically significant stenosis.  The abdominal aorta is mildly tortuous but of normal caliber.  No abdominal aortic dissection.  There is a minimal amount of contrast which appears external to the calcified plaque within the distal aspect of the abdominal aorta (image 73, series 2).  No periaortic stranding.  Celiac artery:  Widely patent; conventional branching pattern.  SMA:  Widely patent; conventional branching pattern.  Right renal artery: Duplicated; there is a tiny accessory left sided renal artery which supplies the inferior pole right kidney. The prominent right-sided renal artery is widely patent.  Left renal artery:   Duplicated; there is a tiny accessory left sided renal artery which arises from the more caudal aspect of the abdominal aorta to supply the inferior pole of the left kidney. The dominant left-sided renal arteries widely patent without hemodynamically significant stenosis.  IMA:  Patent.  Pelvic vasculature:  There is eccentric calcified plaque within the bilateral common iliac arteries, not resulting in hemodynamically significant stenosis.  The bilateral external iliac arteries are widely patent.  The bilateral internal iliac arteries are diseased but patent and of normal caliber.  Review of the MIP images confirms the above findings.  ------------------------------------------------------------------- ------  Nonvascular findings:  Evaluation of the abdominal organs is limited to the arterial phase of enhancement.  Normal hepatic contour.  No discrete hepatic lesions.  Gallbladder is decompressed but otherwise normal.  No ascites.  There is symmetric enhancement and excretion of the bilateral kidneys.  Note is made of approximately 2.5 cm hypoattenuating (9 HU) cyst within the mid aspect of the left kidney.  No discrete right-sided renal lesions.  No definite renal stones on the post contrast examination.  No perinephric stranding.  Normal appearance of the bilateral adrenal glands, pancreas and spleen, though note, the spleen and left kidney are caudally displaced secondary to the large left-sided hemothorax.  The bowel is decompressed but otherwise normal.  No evidence of enteric obstruction.  No pneumoperitoneum, pneumatosis or portal venous gas.  No retroperitoneal, mesenteric, pelvic or inguinal lymphadenopathy, though evaluation degraded secondary to lack of mesenteric fat.  A calcified uterine fibroids.  Small amount of free fluid in the pelvis.  No discrete adnexal lesion.  No acute or aggressive osseous abnormalities.  IMPRESSION: 1.  Extensive calcified atherosclerotic plaque within the distal aspect of  a normal caliber abdominal aorta.  2.  There is a possible tiny focus of contrast which appears external to the calcified anterior wall of the distal abdominal aorta however this is without associated adjacent stranding or definitive extravasation (image 73, series 2). While indeterminate this is favored to represent a tiny penetrating atherosclerotic ulcer.  Attention on follow-up examinations is recommended.  3.  Calcified/degenerating uterine fibroids. 4.  Small amount of free fluid in the pelvis,  presumably physiologic  Above findings discussed with Dr. Oletta Lamas at 2103.   Original Report Authenticated By: Tacey Ruiz, MD    Dg Chest Port 1 View  03/30/2012  *RADIOLOGY REPORT*  Clinical Data: Shortness of breath.  PORTABLE CHEST - 1 VIEW  Comparison: 03/27/2009.  Findings: Stented thoracic aorta.  Interval development of left- sided pleural effusion and opacification left mid to lower lung zones.  It is possible this is related to the aorta.  CT scan would be necessary for further delineation.  Heart size difficult to adequately assessed.  Pulmonary vascular prominence most notable centrally.  No gross pneumothorax.  Right axillary lymph node dissection.  IMPRESSION: Stented thoracic aorta.  Interval development of left-sided pleural effusion and opacification left mid to lower lung zones.  It is possible this is related to the aorta.  CT scan would be necessary for further delineation.  Critical Value/emergent results were called by telephone at the time of interpretation on 03/30/2012 at 8:43 p.m. to Dr. Hyacinth Meeker, who verbally acknowledged these results.   Original Report Authenticated By: Lacy Duverney, M.D.      1. Descending thoracic aortic dissection       MDM   At this time the pt appears ill, diaphoretic, tachycadic and borderline hypotensive.  Pt has hx of thoracic aortic aneurysm, stat CXR, ECG, CT to r/o diseection or rupture.    Pt has had staged repair of ascending and descending TAA in  2009, pt states she has had no complications of this since.    ECG shows Stach without acute findings,   ED ECG REPORT  I personally interpreted this EKG   Date: 03/30/2012   Rate: 107  Rhythm: sinus tachycardia  QRS Axis: normal  Intervals: normal  ST/T Wave abnormalities: nonspecific ST changes  Conduction Disutrbances:none  Narrative Interpretation:   Old EKG Reviewed: SLT rate significantly increased from 40 bpm, LVH no longer seen  Bedside US shows that she has free fluid in the abdomen  dw Dr. Laneta Simmers - has seen portable chest - requests CT at this time.  IV fluids   Filed Vitals:   03/30/12 2009  BP: 103/79  Pulse: 116    D/w Dr. Laneta Simmers of CT surgery - recommends transfer to [facility] to care for complicated condition - CT shows endoleak and large thoracic hemothorax.  Fluids and blood ordered, emergency release, 2 units started,   CRITICAL CARE Performed by: Vida Roller   Total critical care time: 35  Critical care time was exclusive of separately billable procedures and treating other patients.  Critical care was necessary to treat or prevent imminent or life-threatening deterioration.  Critical care was time spent personally by me on the following activities: development of treatment plan with patient and/or surrogate as well as nursing, discussions with consultants, evaluation of patient's response to treatment, examination of patient, obtaining history from patient or surrogate, ordering and performing treatments and interventions, ordering and review of laboratory studies, ordering and review of radiographic studies, pulse oximetry and re-evaluation of patient's condition.       Vida Roller, MD 03/30/12 2138

## 2012-03-31 LAB — TYPE AND SCREEN
ABO/RH(D): O POS
Antibody Screen: NEGATIVE
Unit division: 0
Unit division: 0
Unit division: 0
Unit division: 0

## 2012-03-31 LAB — ABO/RH: ABO/RH(D): O POS

## 2012-03-31 LAB — PREPARE RBC (CROSSMATCH)

## 2012-03-31 MED FILL — Hydromorphone HCl Preservative Free (PF) Inj 2 MG/ML: INTRAMUSCULAR | Qty: 1 | Status: AC

## 2012-04-24 ENCOUNTER — Other Ambulatory Visit: Payer: Self-pay | Admitting: *Deleted

## 2012-04-24 MED ORDER — LOSARTAN POTASSIUM 50 MG PO TABS: 50.0000 mg | ORAL_TABLET | Freq: Every day | ORAL | Status: DC

## 2012-04-25 ENCOUNTER — Encounter: Payer: 59 | Admitting: Endocrinology

## 2012-05-01 ENCOUNTER — Encounter: Payer: 59 | Admitting: Endocrinology

## 2012-05-04 ENCOUNTER — Encounter: Payer: 59 | Admitting: Endocrinology

## 2012-05-04 DIAGNOSIS — Z0289 Encounter for other administrative examinations: Secondary | ICD-10-CM

## 2012-05-17 ENCOUNTER — Ambulatory Visit
Admission: RE | Admit: 2012-05-17 | Discharge: 2012-05-17 | Disposition: A | Discharge status: Home / Self Care | Payer: 59 | Source: Ambulatory Visit | Attending: Endocrinology | Admitting: Endocrinology

## 2012-05-17 ENCOUNTER — Ambulatory Visit (INDEPENDENT_AMBULATORY_CARE_PROVIDER_SITE_OTHER): Payer: 59 | Admitting: Endocrinology

## 2012-05-17 VITALS — BP 126/74 | HR 76 | Wt 112.0 lb

## 2012-05-17 DIAGNOSIS — Z79899 Other long term (current) drug therapy: Secondary | ICD-10-CM

## 2012-05-17 DIAGNOSIS — R059 Cough, unspecified: Secondary | ICD-10-CM

## 2012-05-17 DIAGNOSIS — M069 Rheumatoid arthritis, unspecified: Secondary | ICD-10-CM

## 2012-05-17 DIAGNOSIS — I10 Essential (primary) hypertension: Secondary | ICD-10-CM

## 2012-05-17 DIAGNOSIS — E042 Nontoxic multinodular goiter: Secondary | ICD-10-CM

## 2012-05-17 DIAGNOSIS — D649 Anemia, unspecified: Secondary | ICD-10-CM

## 2012-05-17 DIAGNOSIS — E7800 Pure hypercholesterolemia, unspecified: Secondary | ICD-10-CM

## 2012-05-17 DIAGNOSIS — M2550 Pain in unspecified joint: Secondary | ICD-10-CM

## 2012-05-17 DIAGNOSIS — R05 Cough: Secondary | ICD-10-CM

## 2012-05-17 LAB — URINALYSIS, ROUTINE W REFLEX MICROSCOPIC
Bilirubin Urine: NEGATIVE
Urine Glucose: NEGATIVE
Urobilinogen, UA: 0.2 (ref 0.0–1.0)

## 2012-05-17 LAB — CBC WITH DIFFERENTIAL/PLATELET
Basophils Relative: 0.1 % (ref 0.0–3.0)
Eosinophils Relative: 0.6 % (ref 0.0–5.0)
HCT: 32.1 % — ABNORMAL LOW (ref 36.0–46.0)
Hemoglobin: 10.3 g/dL — ABNORMAL LOW (ref 12.0–15.0)
Lymphs Abs: 1.7 10*3/uL (ref 0.7–4.0)
Monocytes Relative: 3.8 % (ref 3.0–12.0)
Neutro Abs: 7 10*3/uL (ref 1.4–7.7)
RBC: 4.24 Mil/uL (ref 3.87–5.11)
RDW: 19.4 % — ABNORMAL HIGH (ref 11.5–14.6)

## 2012-05-17 LAB — BASIC METABOLIC PANEL
CO2: 23 mEq/L (ref 19–32)
Calcium: 9.1 mg/dL (ref 8.4–10.5)
Chloride: 104 mEq/L (ref 96–112)
Creatinine, Ser: 0.7 mg/dL (ref 0.4–1.2)
Glucose, Bld: 131 mg/dL — ABNORMAL HIGH (ref 70–99)

## 2012-05-17 LAB — HEPATIC FUNCTION PANEL
ALT: 10 U/L (ref 0–35)
AST: 16 U/L (ref 0–37)
Total Protein: 8.7 g/dL — ABNORMAL HIGH (ref 6.0–8.3)

## 2012-05-17 LAB — IBC PANEL: Saturation Ratios: 6 % — ABNORMAL LOW (ref 20.0–50.0)

## 2012-05-17 LAB — LIPID PANEL
Cholesterol: 133 mg/dL (ref 0–200)
HDL: 40.6 mg/dL (ref >39.00)
Triglycerides: 53 mg/dL (ref 0.0–149.0)

## 2012-05-17 LAB — SEDIMENTATION RATE: Sed Rate: 114 mm/hr — ABNORMAL HIGH (ref 0–22)

## 2012-05-17 LAB — VITAMIN B12: Vitamin B-12: 328 pg/mL (ref 211–911)

## 2012-05-17 MED ORDER — HYDROCODONE-ACETAMINOPHEN 10-325 MG PO TABS: 1.0000 | ORAL_TABLET | Freq: Four times a day (QID) | ORAL | Status: DC | PRN

## 2012-05-17 NOTE — Progress Notes (Signed)
  Subjective:    Patient ID: Diana Walters, female    DOB: 1955-05-08, 57 y.o.   MRN: [id number]  HPI Pt states few mos of severe pain at the joints, worst at the left knee, and assoc weight loss. She last saw rheumatologist approx 5 years ago.  Past Medical History  Diagnosis Date  . HYPERCHOLESTEROLEMIA 04/01/2008  . ASTHMA 10/28/2006  . ANXIETY 10/28/2006  . ANEMIA 10/17/2009  . HYPERTENSION 10/28/2006  . 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 SEIZURE DISORDER 04/01/2008  . Menopause     age 20    Past Surgical History  Procedure Laterality Date  . Esophagogastroduodenoscopy  01/29/2004    History   Social History  . Marital Status: Single    Spouse Name: N/A    Number of Children: N/A  . Years of Education: N/A   Occupational History  . Not on file.   Social History Main Topics  . Smoking status: Former Smoker    Types: Cigarettes  . Smokeless tobacco: Not on file  . Alcohol Use: No  . Drug Use: No  . Sexually Active: Not on file   Other Topics Concern  . Not on file   Social History Narrative  . No narrative on file    Current Outpatient Prescriptions on File Prior to Visit  Medication Sig Dispense Refill  . amLODipine (NORVASC) 5 MG tablet TAKE 1 TABLET BY MOUTH EVERY DAY  30 tablet  5  . aspirin ([facility] ASPIRIN EC) 81 MG EC tablet Take 81 mg by mouth daily.        . furosemide (LASIX) 40 MG tablet Take 40 mg by mouth daily.        Marland Kitchen KEPPRA 1000 MG tablet TAKE 1 TABLET BY MOUTH TWICE A DAY  60 tablet  1  . losartan (COZAAR) 50 MG tablet Take 1 tablet (50 mg total) by mouth daily.  30 tablet  0  . Multiple Vitamin (MULTIVITAMIN) tablet Take 1 tablet by mouth daily.        . simvastatin (ZOCOR) 40 MG tablet TAKE 1 TABLET BY MOUTH AT BEDTIME  30 tablet  5   No current facility-administered medications on file prior to visit.    No Known Allergies  Family History  Problem Relation Age of Onset  . Cancer Mother     Breast Cancer    BP 126/74  Pulse 76   Wt 112 lb (50.803 kg)  BMI 16.53 kg/m2  SpO2 98%   Review of Systems She has a slight cough, but no sob.  No fever.    Objective:   Physical Exam VITAL SIGNS:  See vs page GENERAL: no distress Lean body habitus MCP's and left knee are swollen and tender.     Assessment & Plan:  RA, worse Weight loss, uncertain etiology Cough, uncertain etiology

## 2012-05-17 NOTE — Patient Instructions (Addendum)
blood tests and a chest-x-ray are being requested for you today.  We'll contact you with results. Refer to a rheumatology specialist.  you will receive a phone call, about a day and time for an appointment. Here is a prescription for pain medication. i have requested for a home-health worker will visit you. Please come back for a regular physical appointment in 1 week.

## 2012-05-18 ENCOUNTER — Telehealth: Payer: Self-pay | Admitting: Endocrinology

## 2012-05-18 LAB — RHEUMATOID FACTOR: Rhuematoid fact SerPl-aCnc: 103 IU/mL — ABNORMAL HIGH (ref <=14)

## 2012-05-18 LAB — ANA: Anti Nuclear Antibody(ANA): POSITIVE — AB

## 2012-05-18 NOTE — Telephone Encounter (Signed)
Per Corrie Dandy ins will not over for this reason, order needs to be for nursing or PT, etc. Please place new referral in computer

## 2012-05-18 NOTE — Telephone Encounter (Signed)
eval for home safety

## 2012-05-18 NOTE — Telephone Encounter (Signed)
Pt would like to know her other options?

## 2012-05-18 NOTE — Telephone Encounter (Signed)
The medications i have prescribed for you will help you feel better soon

## 2012-05-18 NOTE — Telephone Encounter (Signed)
Corrie Dandy, [facility] at [facility] called needs more specific info why does pt need home health referral?

## 2012-05-18 NOTE — Telephone Encounter (Signed)
Please advise pt ins won't cover

## 2012-05-18 NOTE — Telephone Encounter (Signed)
please call patient: i have sent a prescription to your pharmacy, for a steroid "pack."  This will help you feel better until you see the specialist.

## 2012-05-18 NOTE — Telephone Encounter (Signed)
lleft message on v-mail for pt

## 2012-05-19 ENCOUNTER — Other Ambulatory Visit: Payer: Self-pay | Admitting: Endocrinology

## 2012-05-19 MED ORDER — METHYLPREDNISOLONE 4 MG PO KIT: PACK | ORAL | Status: DC

## 2012-05-19 NOTE — Telephone Encounter (Signed)
Pt states that pharmacy says rx's were not sent in for her should [facility] on [address]

## 2012-05-19 NOTE — Telephone Encounter (Signed)
i have sent 

## 2012-06-06 ENCOUNTER — Telehealth: Payer: Self-pay

## 2012-06-06 NOTE — Telephone Encounter (Signed)
please call patient: Ins won't cover. Cpx is due

## 2012-06-06 NOTE — Telephone Encounter (Signed)
Left message for pt per Dr. George Hugh message,please schedule cpe

## 2012-06-06 NOTE — Telephone Encounter (Signed)
Mary from [facility] called Home health will not cover arthritis do you want the patient to have PT if so order referral needs to be placec

## 2012-06-07 NOTE — Telephone Encounter (Signed)
CPE has been scheduled w/ the pt for 06/16/12 / Sherri S.

## 2012-06-16 ENCOUNTER — Ambulatory Visit (INDEPENDENT_AMBULATORY_CARE_PROVIDER_SITE_OTHER): Payer: 59 | Admitting: Endocrinology

## 2012-06-16 VITALS — BP 122/76 | HR 76 | Wt 117.0 lb

## 2012-06-16 DIAGNOSIS — D649 Anemia, unspecified: Secondary | ICD-10-CM

## 2012-06-16 DIAGNOSIS — D509 Iron deficiency anemia, unspecified: Secondary | ICD-10-CM

## 2012-06-16 LAB — CBC WITH DIFFERENTIAL/PLATELET
HCT: 32.5 % — ABNORMAL LOW (ref 36.0–46.0)
MCHC: 32.1 g/dL (ref 30.0–36.0)
MCV: 73.9 fl — ABNORMAL LOW (ref 78.0–100.0)
Platelets: 499 10*3/uL — ABNORMAL HIGH (ref 150.0–400.0)
RBC: 4.39 Mil/uL (ref 3.87–5.11)
WBC: 9 10*3/uL (ref 4.5–10.5)

## 2012-06-16 NOTE — Progress Notes (Signed)
Subjective:    Patient ID: Diana Walters, female    DOB: January 23, 1956, 57 y.o.   MRN: [id number]  HPI here for regular wellness examination.  she's feeling pretty well in general, and says chronic med probs are stable, except as noted below. Past Medical History  Diagnosis Date  . HYPERCHOLESTEROLEMIA 04/01/2008  . ASTHMA 10/28/2006  . ANXIETY 10/28/2006  . ANEMIA 10/17/2009  . HYPERTENSION 10/28/2006  . 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 SEIZURE DISORDER 04/01/2008  . Menopause     age 48    Past Surgical History  Procedure Laterality Date  . Esophagogastroduodenoscopy  01/29/2004    History   Social History  . Marital Status: Single    Spouse Name: N/A    Number of Children: N/A  . Years of Education: N/A   Occupational History  . Not on file.   Social History Main Topics  . Smoking status: Former Smoker    Types: Cigarettes  . Smokeless tobacco: Not on file  . Alcohol Use: No  . Drug Use: No  . Sexually Active: Not on file   Other Topics Concern  . Not on file   Social History Narrative  . No narrative on file    Current Outpatient Prescriptions on File Prior to Visit  Medication Sig Dispense Refill  . amLODipine (NORVASC) 5 MG tablet TAKE 1 TABLET BY MOUTH EVERY DAY  30 tablet  5  . aspirin ([facility] ASPIRIN EC) 81 MG EC tablet Take 81 mg by mouth daily.        . carvedilol (COREG) 3.125 MG tablet Take 3.125 mg by mouth 2 (two) times daily with a meal.      . furosemide (LASIX) 40 MG tablet Take 40 mg by mouth daily.        Marland Kitchen HYDROcodone-acetaminophen (NORCO) 10-325 MG per tablet Take 1 tablet by mouth every 6 (six) hours as needed for pain.  50 tablet  1  . KEPPRA 1000 MG tablet TAKE 1 TABLET BY MOUTH TWICE A DAY  60 tablet  1  . losartan (COZAAR) 50 MG tablet Take 1 tablet (50 mg total) by mouth daily.  30 tablet  0  . methylPREDNISolone (MEDROL DOSEPAK) 4 MG tablet follow package directions  21 tablet  0  . Multiple Vitamin (MULTIVITAMIN) tablet Take 1 tablet  by mouth daily.        . simvastatin (ZOCOR) 40 MG tablet TAKE 1 TABLET BY MOUTH AT BEDTIME  30 tablet  5   No current facility-administered medications on file prior to visit.    No Known Allergies  Family History  Problem Relation Age of Onset  . Cancer Mother     Breast Cancer   BP 122/76  Pulse 76  Wt 117 lb (53.071 kg)  BMI 17.27 kg/m2  SpO2 98%   Review of Systems  Constitutional: Negative for fever.  HENT: Positive for hearing loss.   Eyes: Negative for visual disturbance.  Respiratory: Negative for shortness of breath.   Cardiovascular: Negative for chest pain.  Gastrointestinal: Negative for anal bleeding.  Endocrine: Negative for cold intolerance.  Genitourinary: Negative for hematuria.  Skin: Negative for rash.  Allergic/Immunologic: Negative for environmental allergies.  Neurological: Negative for numbness.  Hematological: Does not bruise/bleed easily.  Psychiatric/Behavioral: Negative for dysphoric mood.      Objective:   Physical Exam VS: see vs page GEN: no distress HEAD: head: no deformity eyes: no periorbital swelling, no proptosis external nose and ears are normal  mouth: no lesion seen NECK: 10x normal size, multinodular goiter CHEST WALL: no deformity.  Old healed surgical scars LUNGS:  Clear to auscultation BREASTS:  sees gyn CV: reg rate and rhythm 4/6 systolic murmur ABD: abdomen is soft, nontender.  no hepatosplenomegaly.  not distended.  no hernia GENITALIA/RECTAL: sees gyn EXTEMITIES: no deformity.  no ulcer on the feet.  feet are of normal color and temp.  no edema PULSES: dorsalis pedis intact bilat.  no carotid bruit NEURO:  cn 2-12 grossly intact.   readily moves all 4's.  sensation is intact to touch on the feet SKIN:  Normal temperature, but shin is dry.  No rash or suspicious lesion is visible.   NODES:  None palpable at the neck. PSYCH: alert, oriented x3.  Does not appear anxious nor depressed.     Assessment & Plan:   Wellness visit today, with problems stable, except as noted.  we discussed code status.  pt requests full code, but would not want to be started or maintained on artificial life-support measures if there was not a reasonable chance of recovery.    SEPARATE EVALUATION FOLLOWS--EACH PROBLEM HERE IS NEW, NOT RESPONDING TO TREATMENT, OR POSES SIGNIFICANT RISK TO THE PATIENT'S HEALTH: HISTORY OF THE PRESENT ILLNESS: Pt says she takes fe tabs 1-bid, as rx'ed.   Weight-loss, which has stabilized.   PAST MEDICAL HISTORY reviewed and up to date today REVIEW OF SYSTEMS: Denies LOC PHYSICAL EXAMINATION: VITAL SIGNS:  See vs page GENERAL: no distress MUSCULOSKELETAL: muscle bulk and strength are grossly normal.  no obvious joint swelling.  gait is slow but steady.  RA changes are much better LAB/XRAY RESULTS: Lab Results  Component Value Date   WBC 9.0 05/17/2012   HGB 10.3* 05/17/2012   HCT 32.1* 05/17/2012   MCV 75.7* 05/17/2012   PLT 356.0 05/17/2012  IMPRESSION: fe-deficiency anemia, uncertain etiology Weight loss, prob due to RA.  We'll follow PLAN: See instruction page

## 2012-06-16 NOTE — Patient Instructions (Addendum)
here are some tests for blood in the bowels.  please follow the instructions, and return to the lab downstairs Refer to a gastroenterology specialist.  you will receive a phone call, about a day and time for an appointment blood tests are being requested for you today.  We'll contact you with results.  please consider these measures for your health:  minimize alcohol.  do not use tobacco products.  have a colonoscopy at least every 10 years from age 59.  Women should have an annual mammogram from age 20.  keep firearms safely stored.  always use seat belts.  have working smoke alarms in your home.  see an eye doctor and dentist regularly.  never drive under the influence of alcohol or drugs (including prescription drugs).   Please come back for a follow-up appointment in 6 weeks.   Please call to schedule your mammogram.   it is critically important to prevent falling down (keep floor areas well-lit, dry, and free of loose objects.  If you have a cane, walker, or wheelchair, you should use it, even for short trips around the house.  Also, try not to rush)

## 2012-06-19 LAB — IBC PANEL: Transferrin: 197.9 mg/dL — ABNORMAL LOW (ref 212.0–360.0)

## 2012-06-23 ENCOUNTER — Encounter: Payer: Self-pay | Admitting: Gastroenterology

## 2012-07-05 ENCOUNTER — Encounter: Payer: Self-pay | Admitting: *Deleted

## 2012-07-05 DIAGNOSIS — Z0279 Encounter for issue of other medical certificate: Secondary | ICD-10-CM

## 2012-07-11 ENCOUNTER — Ambulatory Visit: Payer: 59 | Admitting: Gastroenterology

## 2012-07-28 ENCOUNTER — Ambulatory Visit (INDEPENDENT_AMBULATORY_CARE_PROVIDER_SITE_OTHER): Payer: 59 | Admitting: Endocrinology

## 2012-07-28 ENCOUNTER — Encounter: Payer: Self-pay | Admitting: Endocrinology

## 2012-07-28 VITALS — BP 142/78 | HR 95 | Temp 98.3°F | Resp 12 | Ht 66.0 in | Wt 126.0 lb

## 2012-07-28 DIAGNOSIS — D509 Iron deficiency anemia, unspecified: Secondary | ICD-10-CM

## 2012-07-28 LAB — CBC WITH DIFFERENTIAL/PLATELET
Basophils Relative: 1 % (ref 0.0–3.0)
Eosinophils Relative: 0.7 % (ref 0.0–5.0)
HCT: 36.9 % (ref 36.0–46.0)
Hemoglobin: 11.7 g/dL — ABNORMAL LOW (ref 12.0–15.0)
Lymphs Abs: 5.5 10*3/uL — ABNORMAL HIGH (ref 0.7–4.0)
Monocytes Relative: 4.8 % (ref 3.0–12.0)
Neutro Abs: 9.2 10*3/uL — ABNORMAL HIGH (ref 1.4–7.7)
WBC: 16.8 10*3/uL — ABNORMAL HIGH (ref 4.5–10.5)

## 2012-07-28 LAB — IBC PANEL: Iron: 57 ug/dL (ref 42–145)

## 2012-07-28 NOTE — Progress Notes (Signed)
Subjective:    Patient ID: Diana Walters, female    DOB: [birth year], 57 y.o.   MRN: [id number]  HPI The state of at least three ongoing medical problems is addressed today, with interval history of each noted here: Weight loss: she has regained 9 lbs.  Appetite is much better, on rx for RA.   Anemia: she denies brbpr HTN: she denies SOB Past Medical History  Diagnosis Date  . HYPERCHOLESTEROLEMIA 04/01/2008  . ASTHMA 10/28/2006  . ANXIETY 10/28/2006  . ANEMIA 10/17/2009  . HYPERTENSION 10/28/2006  . 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 SEIZURE DISORDER 04/01/2008  . Menopause     age 34  . Duodenitis without mention of hemorrhage 2005    Past Surgical History  Procedure Laterality Date  . Esophagogastroduodenoscopy  01/29/2004    History   Social History  . Marital Status: Single    Spouse Name: N/A    Number of Children: N/A  . Years of Education: N/A   Occupational History  . Not on file.   Social History Main Topics  . Smoking status: Former Smoker    Types: Cigarettes  . Smokeless tobacco: Never Used  . Alcohol Use: No  . Drug Use: No  . Sexually Active: Not on file   Other Topics Concern  . Not on file   Social History Narrative  . No narrative on file    Current Outpatient Prescriptions on File Prior to Visit  Medication Sig Dispense Refill  . amLODipine (NORVASC) 5 MG tablet TAKE 1 TABLET BY MOUTH EVERY DAY  30 tablet  5  . aspirin ([facility] ASPIRIN EC) 81 MG EC tablet Take 81 mg by mouth daily.        . carvedilol (COREG) 3.125 MG tablet Take 3.125 mg by mouth 2 (two) times daily with a meal.      . furosemide (LASIX) 40 MG tablet Take 40 mg by mouth daily.        Marland Kitchen HYDROcodone-acetaminophen (NORCO) 10-325 MG per tablet Take 1 tablet by mouth every 6 (six) hours as needed for pain.  50 tablet  1  . KEPPRA 1000 MG tablet TAKE 1 TABLET BY MOUTH TWICE A DAY  60 tablet  1  . losartan (COZAAR) 50 MG tablet Take 1 tablet (50 mg total) by mouth daily.  30 tablet   0  . methotrexate 2.5 MG tablet Take by mouth 3 (three) times a week.      . methylPREDNISolone (MEDROL DOSEPAK) 4 MG tablet follow package directions  21 tablet  0  . Multiple Vitamin (MULTIVITAMIN) tablet Take 1 tablet by mouth daily.        . simvastatin (ZOCOR) 40 MG tablet TAKE 1 TABLET BY MOUTH AT BEDTIME  30 tablet  5   No current facility-administered medications on file prior to visit.    No Known Allergies  Family History  Problem Relation Age of Onset  . Breast cancer Mother     BP 142/78  Pulse 95  Temp(Src) 98.3 F (36.8 C) (Oral)  Resp 12  Ht 5\' 6"  (1.676 m)  Wt 126 lb (57.153 kg)  BMI 20.35 kg/m2  SpO2 95%   Review of Systems Denies hematuria and chest pain    Objective:   Physical Exam VITAL SIGNS:  See vs page GENERAL: no distress Ext: no edema.   Lab Results  Component Value Date   WBC 16.8* 07/28/2012   HGB 11.7* 07/28/2012   HCT 36.9 07/28/2012  MCV 81.3 07/28/2012   PLT 178.0 07/28/2012      Assessment & Plan:  HTN:  She may have a situational component.  However, with regaining weight, she may need increased rx Weight loss, prob due to RA, improved with the rx of RA Anemia, much better

## 2012-07-28 NOTE — Patient Instructions (Addendum)
We'll continue to follow your weight for now blood tests are being requested for you today.  We'll contact you with results. Please continue the same medications.  We'll recheck your blood pressure at next visit. Please come back for a follow-up appointment in 3 months.   i did your disability form before, in order to get you to the rheumatology appointment.  Now that you are going there, please ask them about disability.

## 2012-09-11 ENCOUNTER — Telehealth: Payer: Self-pay | Admitting: Gastroenterology

## 2012-09-11 NOTE — Telephone Encounter (Signed)
Message copied by Arna Snipe on Mon Sep 11, 2012  1:15 PM ------      Message from: Ok Anis A      Created: Tue Jul 11, 2012 10:19 AM       Charge please  ------

## 2012-11-03 ENCOUNTER — Other Ambulatory Visit: Payer: Self-pay | Admitting: *Deleted

## 2012-11-03 ENCOUNTER — Other Ambulatory Visit (INDEPENDENT_AMBULATORY_CARE_PROVIDER_SITE_OTHER): Payer: 59 | Admitting: *Deleted

## 2012-11-03 ENCOUNTER — Encounter: Payer: Self-pay | Admitting: Endocrinology

## 2012-11-03 ENCOUNTER — Ambulatory Visit (INDEPENDENT_AMBULATORY_CARE_PROVIDER_SITE_OTHER): Payer: 59 | Admitting: Endocrinology

## 2012-11-03 VITALS — BP 124/80 | HR 90 | Ht 69.0 in | Wt 140.0 lb

## 2012-11-03 DIAGNOSIS — D509 Iron deficiency anemia, unspecified: Secondary | ICD-10-CM

## 2012-11-03 DIAGNOSIS — Z23 Encounter for immunization: Secondary | ICD-10-CM

## 2012-11-03 LAB — CBC WITH DIFFERENTIAL/PLATELET
Basophils Relative: 0.6 % (ref 0.0–3.0)
Eosinophils Absolute: 0.2 10*3/uL (ref 0.0–0.7)
Lymphocytes Relative: 39 % (ref 12.0–46.0)
MCHC: 32.5 g/dL (ref 30.0–36.0)
Neutrophils Relative %: 54.5 % (ref 43.0–77.0)
RBC: 5 Mil/uL (ref 3.87–5.11)
WBC: 14.8 10*3/uL — ABNORMAL HIGH (ref 4.5–10.5)

## 2012-11-03 LAB — IBC PANEL
Iron: 62 ug/dL (ref 42–145)
Transferrin: 232 mg/dL (ref 212.0–360.0)

## 2012-11-03 NOTE — Patient Instructions (Addendum)
blood tests are being requested for you today.  We'll contact you with results. Please come back for a regular physical, after 06/16/13.

## 2012-11-03 NOTE — Progress Notes (Signed)
  Subjective:    Patient ID: Diana Walters, female    DOB: 17-Jul-1955, 57 y.o.   MRN: [id number]  HPI Anemia: t no longer takes fe tabs.  Denies brbpr HTN: she takes meds as rx'ed.   Past Medical History  Diagnosis Date  . HYPERCHOLESTEROLEMIA 04/01/2008  . ASTHMA 10/28/2006  . ANXIETY 10/28/2006  . ANEMIA 10/17/2009  . HYPERTENSION 10/28/2006  . 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 SEIZURE DISORDER 04/01/2008  . Menopause     age 82  . Duodenitis without mention of hemorrhage 2005    Past Surgical History  Procedure Laterality Date  . Esophagogastroduodenoscopy  01/29/2004    History   Social History  . Marital Status: Single    Spouse Name: N/A    Number of Children: N/A  . Years of Education: N/A   Occupational History  . Not on file.   Social History Main Topics  . Smoking status: Former Smoker    Types: Cigarettes  . Smokeless tobacco: Never Used  . Alcohol Use: No  . Drug Use: No  . Sexual Activity: Not on file   Other Topics Concern  . Not on file   Social History Narrative  . No narrative on file    Current Outpatient Prescriptions on File Prior to Visit  Medication Sig Dispense Refill  . amLODipine (NORVASC) 5 MG tablet TAKE 1 TABLET BY MOUTH EVERY DAY  30 tablet  5  . aspirin ([facility] ASPIRIN EC) 81 MG EC tablet Take 81 mg by mouth daily.        . carvedilol (COREG) 3.125 MG tablet Take 3.125 mg by mouth 2 (two) times daily with a meal.      . HYDROcodone-acetaminophen (NORCO) 10-325 MG per tablet Take 1 tablet by mouth every 6 (six) hours as needed for pain.  50 tablet  1  . KEPPRA 1000 MG tablet TAKE 1 TABLET BY MOUTH TWICE A DAY  60 tablet  1  . losartan (COZAAR) 50 MG tablet Take 1 tablet (50 mg total) by mouth daily.  30 tablet  0  . methotrexate 2.5 MG tablet Take by mouth 3 (three) times a week.      . Multiple Vitamin (MULTIVITAMIN) tablet Take 1 tablet by mouth daily.        . predniSONE (DELTASONE) 5 MG tablet Take 5 mg by mouth daily.       .  simvastatin (ZOCOR) 40 MG tablet TAKE 1 TABLET BY MOUTH AT BEDTIME  30 tablet  5   No current facility-administered medications on file prior to visit.    No Known Allergies  Family History  Problem Relation Age of Onset  . Breast cancer Mother     BP 124/80  Pulse 90  Ht 5\' 9"  (1.753 m)  Wt 140 lb (63.504 kg)  BMI 20.67 kg/m2  SpO2 98%  Review of Systems Denies hematuria    Objective:   Physical Exam VITAL SIGNS:  See vs page GENERAL: no distress LUNGS:  Clear to auscultation HEART:  Regular rate and rhythm; a systolic murmur is noted. Normal S1,S2.     Lab Results  Component Value Date   WBC 14.8* 11/03/2012   HGB 13.4 11/03/2012   HCT 41.3 11/03/2012   MCV 82.6 11/03/2012   PLT 303.0 11/03/2012      Assessment & Plan:  Anemia, resolved HTN: well-controlled

## 2012-11-07 ENCOUNTER — Other Ambulatory Visit: Payer: Self-pay

## 2012-11-07 MED ORDER — AMLODIPINE BESYLATE 5 MG PO TABS: ORAL_TABLET | ORAL | Status: DC

## 2012-11-15 DIAGNOSIS — I639 Cerebral infarction, unspecified: Secondary | ICD-10-CM

## 2012-11-15 HISTORY — DX: Cerebral infarction, unspecified: I63.9

## 2012-11-21 ENCOUNTER — Other Ambulatory Visit: Payer: Self-pay | Admitting: Endocrinology

## 2012-11-21 MED ORDER — LOSARTAN POTASSIUM 50 MG PO TABS: 50.0000 mg | ORAL_TABLET | Freq: Every day | ORAL | Status: DC

## 2012-11-29 ENCOUNTER — Emergency Department (HOSPITAL_COMMUNITY): Payer: 59

## 2012-11-29 ENCOUNTER — Inpatient Hospital Stay (HOSPITAL_COMMUNITY): Payer: 59

## 2012-11-29 ENCOUNTER — Encounter (HOSPITAL_COMMUNITY): Payer: Self-pay | Admitting: Radiology

## 2012-11-29 ENCOUNTER — Inpatient Hospital Stay (HOSPITAL_COMMUNITY)
Admission: EM | Admit: 2012-11-29 | Discharge: 2012-12-01 | DRG: 041 | Disposition: A | Discharge status: Home / Self Care | Payer: 59 | Attending: Family Medicine | Admitting: Family Medicine

## 2012-11-29 DIAGNOSIS — I639 Cerebral infarction, unspecified: Secondary | ICD-10-CM | POA: Diagnosis present

## 2012-11-29 DIAGNOSIS — I6350 Cerebral infarction due to unspecified occlusion or stenosis of unspecified cerebral artery: Principal | ICD-10-CM | POA: Diagnosis present

## 2012-11-29 DIAGNOSIS — Z23 Encounter for immunization: Secondary | ICD-10-CM

## 2012-11-29 DIAGNOSIS — D509 Iron deficiency anemia, unspecified: Secondary | ICD-10-CM

## 2012-11-29 DIAGNOSIS — M069 Rheumatoid arthritis, unspecified: Secondary | ICD-10-CM | POA: Diagnosis present

## 2012-11-29 DIAGNOSIS — J45909 Unspecified asthma, uncomplicated: Secondary | ICD-10-CM

## 2012-11-29 DIAGNOSIS — Z87891 Personal history of nicotine dependence: Secondary | ICD-10-CM

## 2012-11-29 DIAGNOSIS — Z7902 Long term (current) use of antithrombotics/antiplatelets: Secondary | ICD-10-CM

## 2012-11-29 DIAGNOSIS — E042 Nontoxic multinodular goiter: Secondary | ICD-10-CM

## 2012-11-29 DIAGNOSIS — E785 Hyperlipidemia, unspecified: Secondary | ICD-10-CM | POA: Diagnosis present

## 2012-11-29 DIAGNOSIS — J449 Chronic obstructive pulmonary disease, unspecified: Secondary | ICD-10-CM

## 2012-11-29 DIAGNOSIS — E7800 Pure hypercholesterolemia, unspecified: Secondary | ICD-10-CM

## 2012-11-29 DIAGNOSIS — Q211 Atrial septal defect: Secondary | ICD-10-CM

## 2012-11-29 DIAGNOSIS — Z79899 Other long term (current) drug therapy: Secondary | ICD-10-CM

## 2012-11-29 DIAGNOSIS — I712 Thoracic aortic aneurysm, without rupture, unspecified: Secondary | ICD-10-CM | POA: Diagnosis present

## 2012-11-29 DIAGNOSIS — I10 Essential (primary) hypertension: Secondary | ICD-10-CM | POA: Diagnosis present

## 2012-11-29 DIAGNOSIS — M2550 Pain in unspecified joint: Secondary | ICD-10-CM

## 2012-11-29 DIAGNOSIS — L299 Pruritus, unspecified: Secondary | ICD-10-CM

## 2012-11-29 DIAGNOSIS — G40909 Epilepsy, unspecified, not intractable, without status epilepticus: Secondary | ICD-10-CM | POA: Diagnosis present

## 2012-11-29 DIAGNOSIS — Z780 Asymptomatic menopausal state: Secondary | ICD-10-CM

## 2012-11-29 DIAGNOSIS — F411 Generalized anxiety disorder: Secondary | ICD-10-CM

## 2012-11-29 DIAGNOSIS — Q2111 Secundum atrial septal defect: Secondary | ICD-10-CM

## 2012-11-29 DIAGNOSIS — R569 Unspecified convulsions: Secondary | ICD-10-CM

## 2012-11-29 DIAGNOSIS — R29810 Facial weakness: Secondary | ICD-10-CM | POA: Diagnosis present

## 2012-11-29 DIAGNOSIS — R471 Dysarthria and anarthria: Secondary | ICD-10-CM | POA: Diagnosis present

## 2012-11-29 DIAGNOSIS — R4789 Other speech disturbances: Secondary | ICD-10-CM | POA: Diagnosis present

## 2012-11-29 HISTORY — DX: Cerebral infarction, unspecified: I63.9

## 2012-11-29 LAB — CBC
HCT: 36 % (ref 36.0–46.0)
MCH: 27.1 pg (ref 26.0–34.0)
MCHC: 33.6 g/dL (ref 30.0–36.0)
MCV: 80.7 fL (ref 78.0–100.0)
RDW: 17.1 % — ABNORMAL HIGH (ref 11.5–15.5)
WBC: 11.4 10*3/uL — ABNORMAL HIGH (ref 4.0–10.5)

## 2012-11-29 LAB — COMPREHENSIVE METABOLIC PANEL
ALT: 7 U/L (ref 0–35)
AST: 15 U/L (ref 0–37)
BUN: 16 mg/dL (ref 6–23)
CO2: 23 mEq/L (ref 19–32)
Calcium: 9.3 mg/dL (ref 8.4–10.5)
Creatinine, Ser: 0.62 mg/dL (ref 0.50–1.10)
GFR calc Af Amer: >90 mL/min (ref >90)
GFR calc non Af Amer: >90 mL/min (ref >90)
Sodium: 142 mEq/L (ref 135–145)
Total Bilirubin: 0.2 mg/dL — ABNORMAL LOW (ref 0.3–1.2)

## 2012-11-29 LAB — DIFFERENTIAL
Basophils Absolute: 0 10*3/uL (ref 0.0–0.1)
Eosinophils Absolute: 0.1 10*3/uL (ref 0.0–0.7)
Eosinophils Relative: 1 % (ref 0–5)
Lymphocytes Relative: 31 % (ref 12–46)
Monocytes Absolute: 0.6 10*3/uL (ref 0.1–1.0)

## 2012-11-29 LAB — PROTIME-INR: INR: 0.96 (ref 0.00–1.49)

## 2012-11-29 LAB — SEDIMENTATION RATE: Sed Rate: 42 mm/hr — ABNORMAL HIGH (ref 0–22)

## 2012-11-29 LAB — GLUCOSE, CAPILLARY: Glucose-Capillary: 98 mg/dL (ref 70–99)

## 2012-11-29 LAB — TROPONIN I: Troponin I: <0.3 ng/mL (ref <0.30)

## 2012-11-29 MED ORDER — LEVETIRACETAM 500 MG PO TABS
1000.0000 mg | ORAL_TABLET | Freq: Two times a day (BID) | ORAL | Status: DC
  Administered 2012-11-29 – 2012-12-01 (×4): 1000 mg via ORAL
  Filled 2012-11-29 (×5): qty 2

## 2012-11-29 MED ORDER — ENOXAPARIN SODIUM 40 MG/0.4ML ~~LOC~~ SOLN
40.0000 mg | SUBCUTANEOUS | Status: DC
  Administered 2012-11-29 – 2012-11-30 (×2): 40 mg via SUBCUTANEOUS
  Filled 2012-11-29 (×3): qty 0.4

## 2012-11-29 MED ORDER — METHOTREXATE 2.5 MG PO TABS: 10.0000 mg | ORAL_TABLET | ORAL | Status: DC

## 2012-11-29 MED ORDER — IOHEXOL 350 MG/ML SOLN
100.0000 mL | Freq: Once | INTRAVENOUS | Status: AC | PRN
  Administered 2012-11-29: 100 mL via INTRAVENOUS

## 2012-11-29 MED ORDER — LEVETIRACETAM 500 MG PO TABS: 1000.0000 mg | ORAL_TABLET | Freq: Two times a day (BID) | ORAL | Status: DC

## 2012-11-29 MED ORDER — AMLODIPINE BESYLATE 5 MG PO TABS
5.0000 mg | ORAL_TABLET | Freq: Every day | ORAL | Status: DC
  Administered 2012-11-29 – 2012-12-01 (×3): 5 mg via ORAL
  Filled 2012-11-29 (×3): qty 1

## 2012-11-29 MED ORDER — PREDNISONE 5 MG PO TABS
5.0000 mg | ORAL_TABLET | Freq: Every day | ORAL | Status: DC
  Administered 2012-11-29 – 2012-12-01 (×3): 5 mg via ORAL
  Filled 2012-11-29 (×4): qty 1

## 2012-11-29 MED ORDER — ASPIRIN 325 MG PO TABS
325.0000 mg | ORAL_TABLET | Freq: Once | ORAL | Status: AC
  Administered 2012-11-29: 325 mg via ORAL
  Filled 2012-11-29: qty 1

## 2012-11-29 MED ORDER — ASPIRIN 325 MG PO TABS
325.0000 mg | ORAL_TABLET | Freq: Every day | ORAL | Status: DC
  Administered 2012-11-29 – 2012-11-30 (×2): 325 mg via ORAL
  Filled 2012-11-29 (×3): qty 1

## 2012-11-29 MED ORDER — ASPIRIN 300 MG RE SUPP
300.0000 mg | Freq: Every day | RECTAL | Status: DC
  Filled 2012-11-29 (×2): qty 1

## 2012-11-29 MED ORDER — SIMVASTATIN 20 MG PO TABS
20.0000 mg | ORAL_TABLET | Freq: Every evening | ORAL | Status: DC
  Administered 2012-11-29 – 2012-11-30 (×2): 20 mg via ORAL
  Filled 2012-11-29 (×3): qty 1

## 2012-11-29 MED ORDER — LOSARTAN POTASSIUM 50 MG PO TABS
50.0000 mg | ORAL_TABLET | Freq: Every day | ORAL | Status: DC
  Administered 2012-11-30 – 2012-12-01 (×2): 50 mg via ORAL
  Filled 2012-11-29 (×2): qty 1

## 2012-11-29 MED ORDER — CARVEDILOL 3.125 MG PO TABS
3.1250 mg | ORAL_TABLET | Freq: Two times a day (BID) | ORAL | Status: DC
  Administered 2012-11-29 – 2012-12-01 (×4): 3.125 mg via ORAL
  Filled 2012-11-29 (×6): qty 1

## 2012-11-29 NOTE — ED Provider Notes (Signed)
CSN: [id number]     Arrival date & time 11/29/12  4098 History   First MD Initiated Contact with Patient 11/29/12 1006     Chief Complaint  Patient presents with  . Facial Droop    The history is provided by the patient.   Patient presents the emergency department because of slurred speech and dysphagia which began last night.  She woke and her symptoms continued to be like this today and the family called EMS.  Patient denies any prior history of stroke process have a history of hypercholesterolemia and hypertension.  She denies fevers or chills.  No headache at this time.   Past Medical History  Diagnosis Date  . HYPERCHOLESTEROLEMIA 04/01/2008  . ASTHMA 10/28/2006  . ANXIETY 10/28/2006  . ANEMIA 10/17/2009  . HYPERTENSION 10/28/2006  . 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 SEIZURE DISORDER 04/01/2008  . Menopause     age 57  . Duodenitis without mention of hemorrhage 2005   Past Surgical History  Procedure Laterality Date  . Esophagogastroduodenoscopy  01/29/2004   Family History  Problem Relation Age of Onset  . Breast cancer Mother    History  Substance Use Topics  . Smoking status: Former Smoker    Types: Cigarettes  . Smokeless tobacco: Never Used  . Alcohol Use: No   OB History   Grav Para Term Preterm Abortions TAB SAB Ect Mult Living                 Review of Systems  All other systems reviewed and are negative.    Allergies  Review of patient's allergies indicates no known allergies.  Home Medications   Current Outpatient Rx  Name  Route  Sig  Dispense  Refill  . amLODipine (NORVASC) 5 MG tablet   Oral   Take 5 mg by mouth daily.         . carvedilol (COREG) 3.125 MG tablet   Oral   Take 3.125 mg by mouth 2 (two) times daily with a meal.         . HYDROcodone-acetaminophen (NORCO) 10-325 MG per tablet   Oral   Take 1 tablet by mouth every 6 (six) hours as needed for pain.   50 tablet   1   . InFLIXimab (REMICADE IV)   Intravenous    Inject into the vein. Due the 28th of October    At rheumatology         . levETIRAcetam (KEPPRA) 1000 MG tablet   Oral   Take 1,000 mg by mouth 2 (two) times daily.         Marland Kitchen losartan (COZAAR) 50 MG tablet   Oral   Take 1 tablet (50 mg total) by mouth daily.   30 tablet   2   . methotrexate 2.5 MG tablet   Oral   Take by mouth 3 (three) times a week.         . Multiple Vitamin (MULTIVITAMIN) tablet   Oral   Take 1 tablet by mouth daily.           . predniSONE (DELTASONE) 5 MG tablet   Oral   Take 5 mg by mouth daily.          . simvastatin (ZOCOR) 20 MG tablet   Oral   Take 20 mg by mouth every evening.          BP 161/58  Pulse 60  Temp(Src) 98.6 F (37 C)  Resp 22  SpO2  100% Physical Exam  Nursing note and vitals reviewed. Constitutional: She is oriented to person, place, and time. She appears well-developed and well-nourished. No distress.  HENT:  Head: Normocephalic and atraumatic.  Eyes: EOM are normal.  Neck: Normal range of motion.  Cardiovascular: Normal rate, regular rhythm and normal heart sounds.   Pulmonary/Chest: Effort normal and breath sounds normal.  Abdominal: Soft. She exhibits no distension. There is no tenderness.  Musculoskeletal: Normal range of motion.  Neurological: She is alert and oriented to person, place, and time.  Dysarthric speech.  Right-sided facial droop  Skin: Skin is warm and dry.  Psychiatric: She has a normal mood and affect. Judgment normal.    ED Course  Procedures (including critical care time) Labs Review Labs Reviewed  CBC - Abnormal; Notable for the following:    WBC 11.4 (*)    RDW 17.1 (*)    All other components within normal limits  COMPREHENSIVE METABOLIC PANEL - Abnormal; Notable for the following:    Albumin 3.3 (*)    Total Bilirubin 0.2 (*)    All other components within normal limits  PROTIME-INR  APTT  DIFFERENTIAL  TROPONIN I  GLUCOSE, CAPILLARY   Imaging Review Dg Chest 2  View  11/29/2012   CLINICAL DATA:  Facial droop  EXAM: CHEST  2 VIEW  COMPARISON:  05/17/2012  FINDINGS: Thoracic aortic aneurysm has been repaired with multiple stent grafts unchanged in position. Focal bulge in the mid thoracic aorta on the prior study has resolved on this projection. Heart size upper normal. Negative for heart failure. Negative for infiltrate effusion or edema. Surgical clips in the right axilla are unchanged.  IMPRESSION: No active cardiopulmonary disease.   Electronically Signed   By: Marlan Palau M.D.   On: 11/29/2012 11:44   Ct Head (brain) Wo Contrast  11/29/2012   CLINICAL DATA:  Facial droop, slurred speech  EXAM: CT HEAD WITHOUT CONTRAST  TECHNIQUE: Contiguous axial images were obtained from the base of the skull through the vertex without intravenous contrast.  COMPARISON:  CT 04/08/2007  FINDINGS: Ill-defined hypodensity right frontal operculum, most consistent with acute infarct. This was not present previously.  Ventricle size is normal. Mild chronic microvascular ischemic change in the white matter. Negative for hemorrhage.  Chronic fracture left medial orbit unchanged.  IMPRESSION: Right frontal operculum hypodensity, most likely acute infarct given the history. MRI suggested to confirm and exclude mass lesion.   Electronically Signed   By: Marlan Palau M.D.   On: 11/29/2012 10:41   [name] Head Wo Contrast  11/29/2012   CLINICAL DATA:  Stroke. Difficulty speaking and left facial droop.  EXAM: MRI HEAD WITHOUT CONTRAST  MRA HEAD WITHOUT CONTRAST  TECHNIQUE: Multiplanar, multiecho pulse sequences of the brain and surrounding structures were obtained without intravenous contrast. Angiographic images of the head were obtained using MRA technique without contrast.  COMPARISON:  CT head without contrast 11/29/2012.  FINDINGS: MRI HEAD FINDINGS  The diffusion-weighted images confirm an acute infarct involving in the right insular cortex and frontal operculum. No hemorrhage or  mass lesion is associated. T2 hyperintensities are associated with the area of acute infarction. No other acute infarcts are present. The ventricles are of normal size. A remote lacunar infarct is present in the left coronal radiata. Additional remote lacunar infarction noted within the right cerebellum and left pons. Mild periventricular white matter and hyperintensity is present bilaterally. And scattered punctate areas of susceptibility are present over the convexities bilaterally. Additional foci  present in the cerebellum and occipital lobes. There is a punctate area of remote hemorrhage in the medial right temporal lobe. The most prominent area is in the inferior vermis. No significant extra-axial fluid collections are present.  Flow is present in the major intracranial arteries. The globes and orbits are intact. Minimal mucosal thickening is noted in the left maxillary sinus. A remote medial left orbital blow-out fracture is evident. The paranasal sinuses and mastoid air cells are otherwise clear.  MRA HEAD FINDINGS  The internal carotid arteries and are within normal limits from the high cervical segments through the ICA termini bilaterally. Prominent posterior communicating arteries are present. The A1 and M1 segments are within normal limits. The left A1 segment is dominant. The anterior communicating artery is not clearly visualized. There is mild attenuation of more distal MCA branch vessels bilaterally.  Signal loss in the proximal left vertebral artery and represents either extremely slow flow or proximal occlusion. The right PICA origin is visualized and normal. The vertebrobasilar junction is within normal limits. The basilar artery is small. The right posterior cerebral artery is of fetal type. The left posterior cerebral artery receive contribution both the left P1 segment and a posterior communicating artery. There is asymmetric attenuation of distal PCA branch vessels on the left.  IMPRESSION: MRI  HEAD IMPRESSION  1. 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 2. Multiple remote lacunar infarcts involving the (radiata, left pons, and right cerebellum. 3. Multiple punctate areas of remote hemorrhage scattered throughout the brain. This raises concerning for amyloid angiopathy.  MRA HEAD IMPRESSION  1. 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 2. Mild distal small vessel disease as described.   Electronically Signed   By: Gennette Pac M.D.   On: 11/29/2012 12:33    EKG Interpretation   None       MDM   1. Stroke    Patient was last seen well yesterday.  She's not a candidate for TPA.  Admit to the hospitalist service.  MRI consistent with acute stroke    Lyanne Co, MD 12/02/12 [phone]

## 2012-11-29 NOTE — ED Notes (Signed)
Patient transported to X-ray 

## 2012-11-29 NOTE — Consult Note (Signed)
Referring Physician: Arthor Captain    Chief Complaint: right parietal CVA  HPI: Diana Walters is an 57 y.o. female with history of AO aneurysm tear S/p repair 2010 and further teat in 2014 with repair at [facility].  Patient was on ASA prior to repair but after surgery she states ASA interfered with her RA medications and she was taken off ASA. This AM she awoke and noted she was drooling on the left side of her mouth and had dysarthria.  Due to these symptoms she was brought to hospital.  Initial CT head showed right frontal operculum hypodensity.  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  Theres was also notation of Multiple punctate areas of remote hemorrhage scattered throughout the brain. This raises concerning for amyloid angiopathy. Currently she is NSR, stable and continues to show left facial droop and dysarthria.    Date last known well: Date: 11/28/2012 Time last known well: Time: 20:00 tPA Given: No: out of window  Past Medical History  Diagnosis Date  . HYPERCHOLESTEROLEMIA 04/01/2008  . ASTHMA 10/28/2006  . ANXIETY 10/28/2006  . ANEMIA 10/17/2009  . HYPERTENSION 10/28/2006  . 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 SEIZURE DISORDER 04/01/2008  . Menopause     age 61  . Duodenitis without mention of hemorrhage 2005    Past Surgical History  Procedure Laterality Date  . Esophagogastroduodenoscopy  01/29/2004    Family History  Problem Relation Age of Onset  . Breast cancer Mother    Social History:  reports that she has quit smoking. Her smoking use included Cigarettes. She smoked 0.00 packs per day. She has never used smokeless tobacco. She reports that she does not drink alcohol or use illicit drugs.  Allergies: No Known Allergies  Medications:  No current facility-administered medications for this encounter.   Current Outpatient Prescriptions   Medication Sig Dispense Refill  . amLODipine (NORVASC) 5 MG tablet Take 5 mg by mouth daily.      . carvedilol (COREG) 3.125 MG tablet Take 3.125 mg by mouth 2 (two) times daily with a meal.      . HYDROcodone-acetaminophen (NORCO) 10-325 MG per tablet Take 1 tablet by mouth every 6 (six) hours as needed for pain.  50 tablet  1  . InFLIXimab (REMICADE IV) Inject into the vein. Due the 28th of October    At rheumatology      . levETIRAcetam (KEPPRA) 1000 MG tablet Take 1,000 mg by mouth 2 (two) times daily.      Marland Kitchen losartan (COZAAR) 50 MG tablet Take 1 tablet (50 mg total) by mouth daily.  30 tablet  2  . methotrexate 2.5 MG tablet Take 10 mg by mouth once a week.       . Multiple Vitamin (MULTIVITAMIN) tablet Take 1 tablet by mouth daily.        . predniSONE (DELTASONE) 5 MG tablet Take 5 mg by mouth daily.       . simvastatin (ZOCOR) 20 MG tablet Take 20 mg by mouth every evening.         ROS: All 12 ROS were evaluated and negative with exception of above.   Physical Examination: Blood pressure 161/58, pulse 60, temperature 98.6 F (37 C), resp. rate 22, SpO2 100.00%.  Neurologic Examination: Mental Status: Alert, oriented, thought content appropriate.  Speech dysarthric without evidence of aphasia.  Able to follow 3  step commands without difficulty. Cranial Nerves: II: Discs flat bilaterally; Visual fields grossly normal, pupils equal, round, reactive to light and accommodation III,IV, VI: ptosis not present, extra-ocular motions intact bilaterally V,VII: smile asymmetric on the left, facial light touch sensation normal bilaterally VIII: hearing normal bilaterally IX,X: gag reflex present XI: bilateral shoulder shrug XII: midline tongue extension Motor: Right : Upper extremity   5/5    Left:     Upper extremity   5/5 proximal with 4/5 tricep (secondary to pain)  Lower extremity   5/5     Lower extremity   5/5 Tone and bulk:normal tone throughout; no atrophy noted Sensory:  Pinprick and light touch intact throughout, bilaterally Deep Tendon Reflexes:  Right: Upper Extremity   Left: Upper extremity   biceps (C-5 to C-6) 2/4   biceps (C-5 to C-6) 2/4 tricep (C7) 2/4    triceps (C7) 2/4 Brachioradialis (C6) 2/4  Brachioradialis (C6) 2/4  Lower Extremity Lower Extremity  quadriceps (L-2 to L-4) 2/4   quadriceps (L-2 to L-4) 2/4 Achilles (S1) 2/4   Achilles (S1) 2/4  Plantars: Right: downgoing   Left: downgoing Cerebellar: normal finger-to-nose,  normal heel-to-shin test Gait: not examined due to acuity of problem CV: pulses palpable throughout    Laboratory Studies:  Basic Metabolic Panel:  Recent Labs Lab 11/29/12 1100  NA 142  K 3.8  CL 105  CO2 23  GLUCOSE 91  BUN 16  CREATININE 0.62  CALCIUM 9.3    Liver Function Tests:  Recent Labs Lab 11/29/12 1100  AST 15  ALT 7  ALKPHOS 73  BILITOT 0.2*  PROT 7.0  ALBUMIN 3.3*   No results found for this basename: LIPASE, AMYLASE,  in the last 168 hours No results found for this basename: AMMONIA,  in the last 168 hours  CBC:  Recent Labs Lab 11/29/12 1100  WBC 11.4*  NEUTROABS 7.1  HGB 12.1  HCT 36.0  MCV 80.7  PLT 247    Cardiac Enzymes:  Recent Labs Lab 11/29/12 1100  TROPONINI <0.30    BNP: No components found with this basename: POCBNP,   CBG:  Recent Labs Lab 11/29/12 1046  GLUCAP 98    Microbiology: Results for orders placed during the hospital encounter of 02/15/10  BODY FLUID CULTURE     Status: None   Collection Time    02/15/10  2:41 PM      Result Value Range Status   Specimen Description SYNOVIAL LEFT KNEE   Final   Special Requests NONE   Final   Gram Stain     Final   Value: RARE WBC PRESENT, PREDOMINANTLY PMN     NO ORGANISMS SEEN   Culture NO GROWTH 3 DAYS   Final   Report Status 02/19/2010 FINAL   Final  GRAM STAIN     Status: None   Collection Time    02/15/10  2:42 PM      Result Value Range Status   Specimen Description SYNOVIAL  LEFT KNEE   Final   Special Requests NONE   Final   Gram Stain     Final   Value: FEW WBC PRESENT, PREDOMINANTLY PMN     NO ORGANISMS SEEN     Gram Stain Report Called to,Read Back By and Verified With: CROOK,C AT 1600 ON 010112 BY HOOKER,B   Report Status 02/15/2010 FINAL   Final  PROTEIN, BODY FLUID     Status: None   Collection Time    02/15/10  2:44 PM      Result Value Range Status   Total protein, fluid 4.9 NO NORMAL RANGE ESTABLISHED FOR THIS TEST   Final   Fluid Type-FTP SYNOVIAL LEFT KNEE   Final  CELL COUNT + DIFF,  W/ CRYST-SYNVL FLD     Status: Abnormal   Collection Time    02/15/10  2:44 PM      Result Value Range Status   Color, Synovial YELLOW  YELLOW Final   Appearance-Synovial CLOUDY (*) CLEAR Final   Crystals, Fluid NO CRYSTALS SEEN   Final   WBC, Synovial    0 - 200 /cu mm Final   Value: SPECIMEN CLOTTED CALLED TO CROOK,C AT 1610 ON [id number] BY HOOKER,B   Neutrophil, Synovial 91 (*) 0 - 25 % Final   Lymphocytes-Synovial Fld 6  0 - 20 % Final   Monocyte-Macrophage-Synovial Fluid 3 (*) 50 - 90 % Final   Eosinophils-Synovial 0  0 - 1 % Final   Other Cells-SYN NONE   Final  GLUCOSE, SEROUS FLUID     Status: None   Collection Time    02/15/10  2:52 PM      Result Value Range Status   Glucose, Fluid     Final   Value: 85            FLUID GLUCOSE LEVELS OF <60     mg/dL OR VALUES OF 40 mg/dL     LESS THAN A SIMULTANEOUS     SERUM LEVEL ARE CONSIDERED     DECREASED.   Fluid Type-FGLU SYNOVIAL LEFT KNEE   Final    Coagulation Studies:  Recent Labs  11/29/12 1100  LABPROT 12.6  INR 0.96    Urinalysis: No results found for this basename: COLORURINE, APPERANCEUR, LABSPEC, PHURINE, GLUCOSEU, HGBUR, BILIRUBINUR, KETONESUR, PROTEINUR, UROBILINOGEN, NITRITE, LEUKOCYTESUR,  in the last 168 hours  Lipid Panel:    Component Value Date/Time   CHOL 133 05/17/2012 1114   TRIG 53.0 05/17/2012 1114   HDL 40.60 05/17/2012 1114   CHOLHDL 3 05/17/2012 1114   VLDL 10.6  05/17/2012 1114   LDLCALC 82 05/17/2012 1114    HgbA1C:  No results found for this basename: HGBA1C    Urine Drug Screen:     Component Value Date/Time   LABOPIA NONE DETECTED 04/08/2007 1402   COCAINSCRNUR NONE DETECTED 04/08/2007 1402   LABBENZ NONE DETECTED 04/08/2007 1402   AMPHETMU NONE DETECTED 04/08/2007 1402   THCU POSITIVE* 04/08/2007 1402   LABBARB  Value: NONE DETECTED        DRUG SCREEN FOR MEDICAL PURPOSES ONLY.  IF CONFIRMATION IS NEEDED FOR ANY PURPOSE, NOTIFY LAB WITHIN 5 DAYS. 04/08/2007 1402    Alcohol Level: No results found for this basename: ETH,  in the last 168 hours   Imaging: Dg Chest 2 View  11/29/2012   CLINICAL DATA:  Facial droop  EXAM: CHEST  2 VIEW  COMPARISON:  05/17/2012  FINDINGS: Thoracic aortic aneurysm has been repaired with multiple stent grafts unchanged in position. Focal bulge in the mid thoracic aorta on the prior study has resolved on this projection. Heart size upper normal. Negative for heart failure. Negative for infiltrate effusion or edema. Surgical clips in the right axilla are unchanged.  IMPRESSION: No active cardiopulmonary disease.   Electronically Signed   By: Marlan Palau M.D.   On: 11/29/2012 11:44   Ct Head (brain) Wo Contrast  11/29/2012   CLINICAL DATA:  Facial droop, slurred speech  EXAM: CT  HEAD WITHOUT CONTRAST  TECHNIQUE: Contiguous axial images were obtained from the base of the skull through the vertex without intravenous contrast.  COMPARISON:  CT 04/08/2007  FINDINGS: Ill-defined hypodensity right frontal operculum, most consistent with acute infarct. This was not present previously.  Ventricle size is normal. Mild chronic microvascular ischemic change in the white matter. Negative for hemorrhage.  Chronic fracture left medial orbit unchanged.  IMPRESSION: Right frontal operculum hypodensity, most likely acute infarct given the history. MRI suggested to confirm and exclude mass lesion.   Electronically Signed   By: Marlan Palau  M.D.   On: 11/29/2012 10:41   [name] Head Wo Contrast  11/29/2012   CLINICAL DATA:  Stroke. Difficulty speaking and left facial droop.  EXAM: MRI HEAD WITHOUT CONTRAST  MRA HEAD WITHOUT CONTRAST  TECHNIQUE: Multiplanar, multiecho pulse sequences of the brain and surrounding structures were obtained without intravenous contrast. Angiographic images of the head were obtained using MRA technique without contrast.  COMPARISON:  CT head without contrast 11/29/2012.  FINDINGS: MRI HEAD FINDINGS  The diffusion-weighted images confirm an acute infarct involving in the right insular cortex and frontal operculum. No hemorrhage or mass lesion is associated. T2 hyperintensities are associated with the area of acute infarction. No other acute infarcts are present. The ventricles are of normal size. A remote lacunar infarct is present in the left coronal radiata. Additional remote lacunar infarction noted within the right cerebellum and left pons. Mild periventricular white matter and hyperintensity is present bilaterally. And scattered punctate areas of susceptibility are present over the convexities bilaterally. Additional foci present in the cerebellum and occipital lobes. There is a punctate area of remote hemorrhage in the medial right temporal lobe. The most prominent area is in the inferior vermis. No significant extra-axial fluid collections are present.  Flow is present in the major intracranial arteries. The globes and orbits are intact. Minimal mucosal thickening is noted in the left maxillary sinus. A remote medial left orbital blow-out fracture is evident. The paranasal sinuses and mastoid air cells are otherwise clear.  MRA HEAD FINDINGS  The internal carotid arteries and are within normal limits from the high cervical segments through the ICA termini bilaterally. Prominent posterior communicating arteries are present. The A1 and M1 segments are within normal limits. The left A1 segment is dominant. The anterior  communicating artery is not clearly visualized. There is mild attenuation of more distal MCA branch vessels bilaterally.  Signal loss in the proximal left vertebral artery and represents either extremely slow flow or proximal occlusion. The right PICA origin is visualized and normal. The vertebrobasilar junction is within normal limits. The basilar artery is small. The right posterior cerebral artery is of fetal type. The left posterior cerebral artery receive contribution both the left P1 segment and a posterior communicating artery. There is asymmetric attenuation of distal PCA branch vessels on the left.  IMPRESSION: MRI HEAD IMPRESSION  1. 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 2. Multiple remote lacunar infarcts involving the (radiata, left pons, and right cerebellum. 3. Multiple punctate areas of remote hemorrhage scattered throughout the brain. This raises concerning for amyloid angiopathy.  MRA HEAD IMPRESSION  1. 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 2. Mild distal small vessel disease as described.   Electronically Signed   By: Gennette Pac M.D.   On: 11/29/2012 12:33    Assessment: 57 y.o. female with acute nonhemorrhagic infarct involving the right insular cortex and operculum  Stroke Risk Factors - hypertension  Plan: 1. HgbA1c, fasting lipid panel 2. PT consult, OT consult, Speech consult 3. Echocardiogram 4. Carotid dopplers 5. Prophylactic therapy-Antiplatelet med: Aspirin - dose 81 mg daily 6. Risk factor modification 7. Telemetry monitoring 8. Frequent neuro checks  I personally participated in this patient's evaluation and management, including formulating the above clinical impression and management recommendations.  Venetia Maxon M.D. [facility] [phone]

## 2012-11-29 NOTE — Consult Note (Signed)
Referring Physician: Arthor Captain    Chief Complaint: stroke  HPI:                                                                                                                                         Diana Walters is an 57 y.o. female with history of AO aneurysm tear S/p repair 2010 and further teat in 2014 with repair at [facility]. Patient was on ASA prior to repair but after surgery she states ASA interfered with her RA medications and she was taken off ASA. This AM she awoke and noted she was drooling on the left side of her mouth and had dysarthria. Due to these symptoms she was brought to hospital. Initial CT head showed right frontal operculum hypodensity. 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 Theres was also notation of Multiple punctate areas of remote hemorrhage scattered throughout the brain. This raises concerning for amyloid angiopathy. Currently she is NSR, stable and continues to show left facial droop and dysarthria.    Date last known well: Date: 11/28/2012 Time last known well: Time: 20:00 tPA Given: No: out of window   Past Medical History  Diagnosis Date  . HYPERCHOLESTEROLEMIA 04/01/2008  . ASTHMA 10/28/2006  . ANXIETY 10/28/2006  . ANEMIA 10/17/2009  . HYPERTENSION 10/28/2006  . 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 SEIZURE DISORDER 04/01/2008  . Menopause     age 57  . Duodenitis without mention of hemorrhage 2005    Past Surgical History  Procedure Laterality Date  . Esophagogastroduodenoscopy  01/29/2004    Family History  Problem Relation Age of Onset  . Breast cancer Mother    Social History:  reports that she has quit smoking. Her smoking use included Cigarettes. She smoked 0.00 packs per day. She has never used smokeless tobacco. She reports that she does not drink alcohol or use illicit drugs.  Allergies: No Known  Allergies  Medications:                                                                                                                           No current facility-administered medications for this encounter.   Current Outpatient Prescriptions  Medication Sig Dispense Refill  . amLODipine (NORVASC) 5 MG tablet Take 5  mg by mouth daily.      . carvedilol (COREG) 3.125 MG tablet Take 3.125 mg by mouth 2 (two) times daily with a meal.      . HYDROcodone-acetaminophen (NORCO) 10-325 MG per tablet Take 1 tablet by mouth every 6 (six) hours as needed for pain.  50 tablet  1  . InFLIXimab (REMICADE IV) Inject into the vein. Due the 28th of October    At rheumatology      . levETIRAcetam (KEPPRA) 1000 MG tablet Take 1,000 mg by mouth 2 (two) times daily.      Marland Kitchen losartan (COZAAR) 50 MG tablet Take 1 tablet (50 mg total) by mouth daily.  30 tablet  2  . methotrexate 2.5 MG tablet Take 10 mg by mouth once a week.       . Multiple Vitamin (MULTIVITAMIN) tablet Take 1 tablet by mouth daily.        . predniSONE (DELTASONE) 5 MG tablet Take 5 mg by mouth daily.       . simvastatin (ZOCOR) 20 MG tablet Take 20 mg by mouth every evening.         ROS:                                                                                                                                       History obtained from the patient  General ROS: negative for - chills, fatigue, fever, night sweats, weight gain or weight loss Psychological ROS: negative for - behavioral disorder, hallucinations, memory difficulties, mood swings or suicidal ideation Ophthalmic ROS: negative for - blurry vision, double vision, eye pain or loss of vision ENT ROS: negative for - epistaxis, nasal discharge, oral lesions, sore throat, tinnitus or vertigo Allergy and Immunology ROS: negative for - hives or itchy/watery eyes Hematological and Lymphatic ROS: negative for - bleeding problems, bruising or swollen lymph nodes Endocrine ROS:  negative for - galactorrhea, hair pattern changes, polydipsia/polyuria or temperature intolerance Respiratory ROS: negative for - cough, hemoptysis, shortness of breath or wheezing Cardiovascular ROS: negative for - chest pain, dyspnea on exertion, edema or irregular heartbeat Gastrointestinal ROS: negative for - abdominal pain, diarrhea, hematemesis, nausea/vomiting or stool incontinence Genito-Urinary ROS: negative for - dysuria, hematuria, incontinence or urinary frequency/urgency Musculoskeletal ROS: negative for - joint swelling or muscular weakness Neurological ROS: as noted in HPI Dermatological ROS: negative for rash and skin lesion changes  Neurologic Examination:  Blood pressure 161/50, pulse 60, temperature 98.6 F (37 C), resp. rate 22, SpO2 100.00%.  Mental Status:  Alert, oriented, thought content appropriate. Speech dysarthric without evidence of aphasia. Able to follow 3 step commands without difficulty.  Cranial Nerves:  II: Discs flat bilaterally; Visual fields grossly normal, pupils equal, round, reactive to light and accommodation  III,IV, VI: ptosis not present, extra-ocular motions intact bilaterally  V,VII: smile asymmetric on the left, facial light touch sensation normal bilaterally  VIII: hearing normal bilaterally  IX,X: gag reflex present  XI: bilateral shoulder shrug  XII: midline tongue extension  Motor:  Right : Upper extremity 5/5 Left: Upper extremity 5/5 proximal with 4/5 tricep (secondary to pain)  Lower extremity 5/5 Lower extremity 5/5  Tone and bulk:normal tone throughout; no atrophy noted  Sensory: Pinprick and light touch intact throughout, bilaterally  Deep Tendon Reflexes:  Right:  Upper Extremity   Left:  Upper extremity   biceps (C-5 to C-6) 2/4   biceps (C-5 to C-6) 2/4   tricep (C7) 2/4     triceps (C7) 2/4   Brachioradialis (C6) 2/4    Brachioradialis (C6) 2/4   Lower Extremity    Lower Extremity   quadriceps (L-2 to L-4) 2/4   quadriceps (L-2 to L-4) 2/4   Achilles (S1) 2/4    Achilles (S1) 2/4  Plantars:  Right: downgoing  Left: downgoing  Cerebellar:  normal finger-to-nose, normal heel-to-shin test  Gait: not tested CV: pulses palpable throughout    Results for orders placed during the hospital encounter of 11/29/12 (from the past 48 hour(s))  GLUCOSE, CAPILLARY     Status: None   Collection Time    11/29/12 10:46 AM      Result Value Range   Glucose-Capillary 98  70 - 99 mg/dL  PROTIME-INR     Status: None   Collection Time    11/29/12 11:00 AM      Result Value Range   Prothrombin Time 12.6  11.6 - 15.2 seconds   INR 0.96  0.00 - 1.49  APTT     Status: None   Collection Time    11/29/12 11:00 AM      Result Value Range   aPTT 33  24 - 37 seconds  CBC     Status: Abnormal   Collection Time    11/29/12 11:00 AM      Result Value Range   WBC 11.4 (*) 4.0 - 10.5 K/uL   RBC 4.46  3.87 - 5.11 MIL/uL   Hemoglobin 12.1  12.0 - 15.0 g/dL   HCT 16.1  09.6 - 04.5 %   MCV 80.7  78.0 - 100.0 fL   MCH 27.1  26.0 - 34.0 pg   MCHC 33.6  30.0 - 36.0 g/dL   RDW 40.9 (*) 81.1 - 91.4 %   Platelets 247  150 - 400 K/uL  DIFFERENTIAL     Status: None   Collection Time    11/29/12 11:00 AM      Result Value Range   Neutrophils Relative % 63  43 - 77 %   Neutro Abs 7.1  1.7 - 7.7 K/uL   Lymphocytes Relative 31  12 - 46 %   Lymphs Abs 3.5  0.7 - 4.0 K/uL   Monocytes Relative 5  3 - 12 %   Monocytes Absolute 0.6  0.1 - 1.0 K/uL   Eosinophils Relative 1  0 - 5 %   Eosinophils Absolute 0.1  0.0 -  0.7 K/uL   Basophils Relative 0  0 - 1 %   Basophils Absolute 0.0  0.0 - 0.1 K/uL  COMPREHENSIVE METABOLIC PANEL     Status: Abnormal   Collection Time    11/29/12 11:00 AM      Result Value Range   Sodium 142  135 - 145 mEq/L   Potassium 3.8  3.5 - 5.1 mEq/L   Chloride 105  96 - 112 mEq/L   CO2 23  19 - 32 mEq/L    Glucose, Bld 91  70 - 99 mg/dL   BUN 16  6 - 23 mg/dL   Creatinine, Ser 2.84  0.50 - 1.10 mg/dL   Calcium 9.3  8.4 - 13.2 mg/dL   Total Protein 7.0  6.0 - 8.3 g/dL   Albumin 3.3 (*) 3.5 - 5.2 g/dL   AST 15  0 - 37 U/L   ALT 7  0 - 35 U/L   Alkaline Phosphatase 73  39 - 117 U/L   Total Bilirubin 0.2 (*) 0.3 - 1.2 mg/dL   GFR calc non Af Amer >90  >90 mL/min   GFR calc Af Amer >90  >90 mL/min   Comment: (NOTE)     The eGFR has been calculated using the CKD EPI equation.     This calculation has not been validated in all clinical situations.     eGFR's persistently <90 mL/min signify possible Chronic Kidney     Disease.  TROPONIN I     Status: None   Collection Time    11/29/12 11:00 AM      Result Value Range   Troponin I <0.30  <0.30 ng/mL   Comment:            Due to the release kinetics of cTnI,     a negative result within the first hours     of the onset of symptoms does not rule out     myocardial infarction with certainty.     If myocardial infarction is still suspected,     repeat the test at appropriate intervals.   Dg Chest 2 View  11/29/2012   CLINICAL DATA:  Facial droop  EXAM: CHEST  2 VIEW  COMPARISON:  05/17/2012  FINDINGS: Thoracic aortic aneurysm has been repaired with multiple stent grafts unchanged in position. Focal bulge in the mid thoracic aorta on the prior study has resolved on this projection. Heart size upper normal. Negative for heart failure. Negative for infiltrate effusion or edema. Surgical clips in the right axilla are unchanged.  IMPRESSION: No active cardiopulmonary disease.   Electronically Signed   By: Marlan Palau M.D.   On: 11/29/2012 11:44   Ct Head (brain) Wo Contrast  11/29/2012   CLINICAL DATA:  Facial droop, slurred speech  EXAM: CT HEAD WITHOUT CONTRAST  TECHNIQUE: Contiguous axial images were obtained from the base of the skull through the vertex without intravenous contrast.  COMPARISON:  CT 04/08/2007  FINDINGS: Ill-defined  hypodensity right frontal operculum, most consistent with acute infarct. This was not present previously.  Ventricle size is normal. Mild chronic microvascular ischemic change in the white matter. Negative for hemorrhage.  Chronic fracture left medial orbit unchanged.  IMPRESSION: Right frontal operculum hypodensity, most likely acute infarct given the history. MRI suggested to confirm and exclude mass lesion.   Electronically Signed   By: Marlan Palau M.D.   On: 11/29/2012 10:41   [name] Head Wo Contrast  11/29/2012   CLINICAL DATA:  Stroke. Difficulty speaking and left facial droop.  EXAM: MRI HEAD WITHOUT CONTRAST  MRA HEAD WITHOUT CONTRAST  TECHNIQUE: Multiplanar, multiecho pulse sequences of the brain and surrounding structures were obtained without intravenous contrast. Angiographic images of the head were obtained using MRA technique without contrast.  COMPARISON:  CT head without contrast 11/29/2012.  FINDINGS: MRI HEAD FINDINGS  The diffusion-weighted images confirm an acute infarct involving in the right insular cortex and frontal operculum. No hemorrhage or mass lesion is associated. T2 hyperintensities are associated with the area of acute infarction. No other acute infarcts are present. The ventricles are of normal size. A remote lacunar infarct is present in the left coronal radiata. Additional remote lacunar infarction noted within the right cerebellum and left pons. Mild periventricular white matter and hyperintensity is present bilaterally. And scattered punctate areas of susceptibility are present over the convexities bilaterally. Additional foci present in the cerebellum and occipital lobes. There is a punctate area of remote hemorrhage in the medial right temporal lobe. The most prominent area is in the inferior vermis. No significant extra-axial fluid collections are present.  Flow is present in the major intracranial arteries. The globes and orbits are intact. Minimal mucosal thickening  is noted in the left maxillary sinus. A remote medial left orbital blow-out fracture is evident. The paranasal sinuses and mastoid air cells are otherwise clear.  MRA HEAD FINDINGS  The internal carotid arteries and are within normal limits from the high cervical segments through the ICA termini bilaterally. Prominent posterior communicating arteries are present. The A1 and M1 segments are within normal limits. The left A1 segment is dominant. The anterior communicating artery is not clearly visualized. There is mild attenuation of more distal MCA branch vessels bilaterally.  Signal loss in the proximal left vertebral artery and represents either extremely slow flow or proximal occlusion. The right PICA origin is visualized and normal. The vertebrobasilar junction is within normal limits. The basilar artery is small. The right posterior cerebral artery is of fetal type. The left posterior cerebral artery receive contribution both the left P1 segment and a posterior communicating artery. There is asymmetric attenuation of distal PCA branch vessels on the left.  IMPRESSION: MRI HEAD IMPRESSION  1. 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 2. Multiple remote lacunar infarcts involving the (radiata, left pons, and right cerebellum. 3. Multiple punctate areas of remote hemorrhage scattered throughout the brain. This raises concerning for amyloid angiopathy.  MRA HEAD IMPRESSION  1. 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 2. Mild distal small vessel disease as described.   Electronically Signed   By: Gennette Pac M.D.   On: 11/29/2012 12:33        Assessment: 57 y.o. female presenting with dysarthria and left facial droop in the setting of MRI positive acute nonhemorrhagic infarct involving the right insular cortex and operculum. tPA was not given as LSN was 11/28/12 and patient awoke with symptoms.   Stroke Risk Factors - hyperlipidemia and  hypertension  Plan: 1. HgbA1c, fasting lipid panel 2. MRI, MRA  of the brain without contrast (done) 3. Echocardiogram 4. Carotid dopplers 5. Prophylactic therapy-Antiplatelet med: Aspirin - dose 81 mg daily 6. Risk factor modification 7. Telemetry monitoring 8. Frequent neuro checks 9. PT/OT SLP   Diana Morn PA-C [facility] [phone]  11/29/2012, 2:48 PM  I personally participate in this patient's evaluation and management, including formulating the above impression and management recommendations.  Venetia Maxon M.D. [facility] [phone]

## 2012-11-29 NOTE — H&P (Signed)
[facility] History and Physical  Diana Walters QIO:[id number] DOB: 07/26/1955 DOA: 11/29/2012  Referring physician: Patria Mane PCP: Romero Belling, MD   Chief Complaint: Slurred speech  HPI: Diana Walters is a 57 y.o. female with past medical history of thoracic aortic aneurysm status post endovascular stent done in [facility] in February of 2014. Patient also has history of hypertension, dyslipidemia and rheumatoid arthritis. According to the patient she's not taking aspirin since February. Patient was in her usual state of health till about 11 PM last night when she noticed a lot of secretion the back of her throat, and she realized that she is having difficulty with speech. This morning when she talked to her daughter she advised her to come to the hospital because of slurred speech. Her daughter works for Dr. Sharene Skeans. In the ED MRI of the brain showed acute hemorrhagic stroke, there was also question about cerebral amyloid angiopathy. Patient will be admitted to the hospital for further evaluation.  Review of Systems:  Constitutional: negative for anorexia, fevers and sweats Eyes: negative for irritation, redness and visual disturbance Ears, nose, mouth, throat, and face: negative for earaches, epistaxis, nasal congestion and sore throat Respiratory: negative for cough, dyspnea on exertion, sputum and wheezing Cardiovascular: negative for chest pain, dyspnea, lower extremity edema, orthopnea, palpitations and syncope Gastrointestinal: negative for abdominal pain, constipation, diarrhea, melena, nausea and vomiting Genitourinary:negative for dysuria, frequency and hematuria Hematologic/lymphatic: negative for bleeding, easy bruising and lymphadenopathy Musculoskeletal:negative for arthralgias, muscle weakness and stiff joints Neurological: negative for coordination problems, gait problems, headaches and weakness Endocrine: negative for diabetic symptoms including polydipsia, polyuria and  weight loss Allergic/Immunologic: negative for anaphylaxis, hay fever and urticaria   Past Medical History  Diagnosis Date  . HYPERCHOLESTEROLEMIA 04/01/2008  . ASTHMA 10/28/2006  . ANXIETY 10/28/2006  . ANEMIA 10/17/2009  . HYPERTENSION 10/28/2006  . 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 SEIZURE DISORDER 04/01/2008  . Menopause     age 15  . Duodenitis without mention of hemorrhage 2005   Past Surgical History  Procedure Laterality Date  . Esophagogastroduodenoscopy  01/29/2004   Social History:  reports that she has quit smoking. Her smoking use included Cigarettes. She smoked 0.00 packs per day. She has never used smokeless tobacco. She reports that she does not drink alcohol or use illicit drugs.  No Known Allergies  Family History  Problem Relation Age of Onset  . Breast cancer Mother     Prior to Admission medications   Medication Sig Start Date End Date Taking? Authorizing Provider  amLODipine (NORVASC) 5 MG tablet Take 5 mg by mouth daily.   Yes Historical Provider, MD  carvedilol (COREG) 3.125 MG tablet Take 3.125 mg by mouth 2 (two) times daily with a meal.   Yes Historical Provider, MD  HYDROcodone-acetaminophen (NORCO) 10-325 MG per tablet Take 1 tablet by mouth every 6 (six) hours as needed for pain. 05/17/12  Yes Romero Belling, MD  InFLIXimab (REMICADE IV) Inject into the vein. Due the 28th of October    At rheumatology   Yes Historical Provider, MD  levETIRAcetam (KEPPRA) 1000 MG tablet Take 1,000 mg by mouth 2 (two) times daily.   Yes Historical Provider, MD  losartan (COZAAR) 50 MG tablet Take 1 tablet (50 mg total) by mouth daily. 11/21/12  Yes Romero Belling, MD  methotrexate 2.5 MG tablet Take 10 mg by mouth once a week.    Yes Historical Provider, MD  Multiple Vitamin (MULTIVITAMIN) tablet Take 1 tablet by mouth  daily.     Yes Historical Provider, MD  predniSONE (DELTASONE) 5 MG tablet Take 5 mg by mouth daily.  06/24/12  Yes Historical Provider, MD  simvastatin  (ZOCOR) 20 MG tablet Take 20 mg by mouth every evening.   Yes Historical Provider, MD   Physical Exam: Filed Vitals:   11/29/12 1302  BP:   Pulse:   Temp: 98.6 F (37 C)  Resp:   General appearance: alert, cooperative and no distress  Head: Normocephalic, without obvious abnormality, atraumatic  Eyes: conjunctivae/corneas clear. PERRL, EOM's intact. Fundi benign.  Nose: Nares normal. Septum midline. Mucosa normal. No drainage or sinus tenderness.  Throat: lips, mucosa, and tongue normal; teeth and gums normal  Neck: Supple, no masses, no cervical lymphadenopathy, no JVD appreciated, no meningeal signs Resp: clear to auscultation bilaterally  Chest wall: no tenderness  Cardio: regular rate and rhythm, S1, S2 normal, no murmur, click, rub or gallop  GI: soft, non-tender; bowel sounds normal; no masses, no organomegaly  Extremities: extremities normal, atraumatic, no cyanosis or edema  Skin: Skin color, texture, turgor normal. No rashes or lesions  Neurologic: Alert and oriented X 3, right facial droop, slurred speech, no weakness or sensory deficit.  Labs on Admission:  Basic Metabolic Panel:  Recent Labs Lab 11/29/12 1100  NA 142  K 3.8  CL 105  CO2 23  GLUCOSE 91  BUN 16  CREATININE 0.62  CALCIUM 9.3   Liver Function Tests:  Recent Labs Lab 11/29/12 1100  AST 15  ALT 7  ALKPHOS 73  BILITOT 0.2*  PROT 7.0  ALBUMIN 3.3*   No results found for this basename: LIPASE, AMYLASE,  in the last 168 hours No results found for this basename: AMMONIA,  in the last 168 hours CBC:  Recent Labs Lab 11/29/12 1100  WBC 11.4*  NEUTROABS 7.1  HGB 12.1  HCT 36.0  MCV 80.7  PLT 247   Cardiac Enzymes:  Recent Labs Lab 11/29/12 1100  TROPONINI <0.30    BNP (last 3 results) No results found for this basename: PROBNP,  in the last 8760 hours CBG:  Recent Labs Lab 11/29/12 1046  GLUCAP 98    Radiological Exams on Admission: Dg Chest 2 View  11/29/2012    CLINICAL DATA:  Facial droop  EXAM: CHEST  2 VIEW  COMPARISON:  05/17/2012  FINDINGS: Thoracic aortic aneurysm has been repaired with multiple stent grafts unchanged in position. Focal bulge in the mid thoracic aorta on the prior study has resolved on this projection. Heart size upper normal. Negative for heart failure. Negative for infiltrate effusion or edema. Surgical clips in the right axilla are unchanged.  IMPRESSION: No active cardiopulmonary disease.   Electronically Signed   By: Marlan Palau M.D.   On: 11/29/2012 11:44   Ct Head (brain) Wo Contrast  11/29/2012   CLINICAL DATA:  Facial droop, slurred speech  EXAM: CT HEAD WITHOUT CONTRAST  TECHNIQUE: Contiguous axial images were obtained from the base of the skull through the vertex without intravenous contrast.  COMPARISON:  CT 04/08/2007  FINDINGS: Ill-defined hypodensity right frontal operculum, most consistent with acute infarct. This was not present previously.  Ventricle size is normal. Mild chronic microvascular ischemic change in the white matter. Negative for hemorrhage.  Chronic fracture left medial orbit unchanged.  IMPRESSION: Right frontal operculum hypodensity, most likely acute infarct given the history. MRI suggested to confirm and exclude mass lesion.   Electronically Signed   By: Marlan Palau M.D.  On: 11/29/2012 10:41   [name] Head Wo Contrast  11/29/2012   CLINICAL DATA:  Stroke. Difficulty speaking and left facial droop.  EXAM: MRI HEAD WITHOUT CONTRAST  MRA HEAD WITHOUT CONTRAST  TECHNIQUE: Multiplanar, multiecho pulse sequences of the brain and surrounding structures were obtained without intravenous contrast. Angiographic images of the head were obtained using MRA technique without contrast.  COMPARISON:  CT head without contrast 11/29/2012.  FINDINGS: MRI HEAD FINDINGS  The diffusion-weighted images confirm an acute infarct involving in the right insular cortex and frontal operculum. No hemorrhage or mass lesion is  associated. T2 hyperintensities are associated with the area of acute infarction. No other acute infarcts are present. The ventricles are of normal size. A remote lacunar infarct is present in the left coronal radiata. Additional remote lacunar infarction noted within the right cerebellum and left pons. Mild periventricular white matter and hyperintensity is present bilaterally. And scattered punctate areas of susceptibility are present over the convexities bilaterally. Additional foci present in the cerebellum and occipital lobes. There is a punctate area of remote hemorrhage in the medial right temporal lobe. The most prominent area is in the inferior vermis. No significant extra-axial fluid collections are present.  Flow is present in the major intracranial arteries. The globes and orbits are intact. Minimal mucosal thickening is noted in the left maxillary sinus. A remote medial left orbital blow-out fracture is evident. The paranasal sinuses and mastoid air cells are otherwise clear.  MRA HEAD FINDINGS  The internal carotid arteries and are within normal limits from the high cervical segments through the ICA termini bilaterally. Prominent posterior communicating arteries are present. The A1 and M1 segments are within normal limits. The left A1 segment is dominant. The anterior communicating artery is not clearly visualized. There is mild attenuation of more distal MCA branch vessels bilaterally.  Signal loss in the proximal left vertebral artery and represents either extremely slow flow or proximal occlusion. The right PICA origin is visualized and normal. The vertebrobasilar junction is within normal limits. The basilar artery is small. The right posterior cerebral artery is of fetal type. The left posterior cerebral artery receive contribution both the left P1 segment and a posterior communicating artery. There is asymmetric attenuation of distal PCA branch vessels on the left.  IMPRESSION: MRI HEAD  IMPRESSION  1. 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 2. Multiple remote lacunar infarcts involving the (radiata, left pons, and right cerebellum. 3. Multiple punctate areas of remote hemorrhage scattered throughout the brain. This raises concerning for amyloid angiopathy.  MRA HEAD IMPRESSION  1. 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 2. Mild distal small vessel disease as described.   Electronically Signed   By: Gennette Pac M.D.   On: 11/29/2012 12:33    EKG: Independently reviewed.  Assessment/Plan Principal Problem:   Acute CVA (cerebrovascular accident) Active Problems:   HYPERCHOLESTEROLEMIA   HYPERTENSION   THORACIC AORTIC ANEURYSM   Rheumatoid arthritis(714.0)   SEIZURE DISORDER    Acute CVA -MRI showed acute nonhemorrhagic infarct involving the right insular cortex and operculum -MRI also showed multiple punctate areas of remote hemorrhage concerning for cerebral amyloid angiopathy. -Neurology consulted. -Started on aspirin, please advise for the need of antiplatelet/and cannulation. -If patient has cerebral amyloid angiopathy it contraindicates anticoagulation. -Recent endovascular aortic stent (without antiplatelet) questionable source of stroke. -Obtain the rest of stroke workup including carotid duplex, telemetry, folate EKG, 2-D echo. -PT/OT/SLP, keep n.p.o. till swallow evaluation.  Thoracic aortic aneurysm -With recently in February of 2014, treated at [facility] with endovascular stent. -Patient reported conflicted data that her vascular surgeon and ask her to take aspirin but her PCP asked her not to. -Obtain   Hypertension -Keep systolic blood pressure was 180, her medication restarted. -Avoid strict blood pressure control in the first 24 hours.  -Dyslipidemia -Continue statin, check fasting lipid profile.  Seizure -Patient is on Keppra, continue.  Code Status: Full code Family  Communication: Plan discussed with the patient in the presence of her daughter. Disposition Plan:  Inpatient, neuro telemetry, anticipate length of stay to be greater than 2 midnights.  Time spent:  70 minutes  [facility] Pager [phone]  If 7PM-7AM, please contact night-coverage [email or web address] Password [email or web address] 11/29/2012, 2:12 PM

## 2012-11-29 NOTE — ED Notes (Signed)
Noted CBG 98 RN

## 2012-11-29 NOTE — ED Notes (Signed)
To ED from home via EMS, pt reports slurred speech and dysphagia last night, awoke with same symptoms and family called EMS this AM, arrives with slurred speech and facial asymetry, VSS, CBG 109, denies CP/SOB or other complaints, NAD

## 2012-11-30 ENCOUNTER — Encounter (HOSPITAL_COMMUNITY): Payer: Self-pay | Admitting: General Practice

## 2012-11-30 DIAGNOSIS — E7800 Pure hypercholesterolemia, unspecified: Secondary | ICD-10-CM

## 2012-11-30 DIAGNOSIS — I517 Cardiomegaly: Secondary | ICD-10-CM

## 2012-11-30 DIAGNOSIS — R569 Unspecified convulsions: Secondary | ICD-10-CM

## 2012-11-30 LAB — LIPID PANEL
LDL Cholesterol: 97 mg/dL (ref 0–99)
Total CHOL/HDL Ratio: 2.5 RATIO
Triglycerides: 63 mg/dL (ref <150)
VLDL: 13 mg/dL (ref 0–40)

## 2012-11-30 MED ORDER — CLOPIDOGREL BISULFATE 75 MG PO TABS
75.0000 mg | ORAL_TABLET | Freq: Every day | ORAL | Status: DC
  Administered 2012-11-30 – 2012-12-01 (×2): 75 mg via ORAL
  Filled 2012-11-30 (×2): qty 1

## 2012-11-30 MED ORDER — PNEUMOCOCCAL VAC POLYVALENT 25 MCG/0.5ML IJ INJ
0.5000 mL | INJECTION | INTRAMUSCULAR | Status: DC
  Filled 2012-11-30: qty 0.5

## 2012-11-30 NOTE — Progress Notes (Signed)
VASCULAR LAB PRELIMINARY  PRELIMINARY  PRELIMINARY  PRELIMINARY  Carotid Dopplers completed.    Preliminary report:  There is 1-39% ICA stenosis.  Right vertebral artery flow is antegrade.  Left vertebral artery flow is retrograde. Minimal plaque noted.  Deamonte Sayegh, RVT 11/30/2012, 1:23 PM

## 2012-11-30 NOTE — Evaluation (Addendum)
Physical Therapy Evaluation Patient Details Name: Diana Walters MRN: [id number] DOB: Nov 05, 1955 Today's Date: 11/30/2012 Time: 4098-1191 PT Time Calculation (min): 28 min  PT Assessment / Plan / Recommendation History of Present Illness  Diana Walters is an 57 y.o. female with history of AO aneurysm tear S/p repair 2010 and further tear in 2014 with repair at [facility]. Patient was on ASA prior to repair but after surgery she states ASA interfered with her RA medications and she was taken off ASA. This AM 11/29/12 she awoke and noted she was drooling on the left side of her mouth and had dysarthria. Due to these symptoms she was brought to hospital. Initial CT head showed right frontal operculum hypodensity. 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 There  was also notation of Multiple punctate areas of remote hemorrhage scattered throughout the brain. This raises concerning for amyloid angiopathy. Currently she is NSR, stable and continues to show left facial droop and dysarthria. Patient was not a TPA candidate secondary to waking with symptoms, out of the window. She was admitted for further evaluation and treatment.  Clinical Impression  Patient demonstrates some modest instability but patient and daughter confirm this is baseline secondary to RA. Patient required no assist for activities today. Pt is at baseline with no further acute PT needs. If change in status please re consult. Acute PT will sign off. Pt in agreement.    PT Assessment  Patent does not need any further PT services    Follow Up Recommendations  No PT follow up          Equipment Recommendations  None recommended by PT             Pertinent Vitals/Pain No pain      Mobility  Bed Mobility Bed Mobility: Sit to Supine;Scooting to HOB Sit to Supine: 7: Independent Scooting to  HOB: 7: Independent Transfers Transfers: Sit to Stand;Stand to Sit Sit to Stand: 7: Independent Stand to Sit: 7: Independent Ambulation/Gait Ambulation/Gait Assistance: 7: Independent;5: Supervision Ambulation Distance (Feet): 400 Feet Assistive device: None Ambulation/Gait Assistance Details: some instability noted pt reports baseline secondary to RA Gait Pattern: Step-through pattern;Decreased stride length;Right flexed knee in stance;Trunk flexed;Narrow base of support Gait velocity: decreased General Gait Details: instability but able to self correct Stairs: Yes Stairs Assistance: 6: Modified independent (Device/Increase time);5: Supervision Stair Management Technique: One rail Right;Step to pattern Number of Stairs: 5 (performed x2) Modified Rankin (Stroke Patients Only) Pre-Morbid Rankin Score: Moderate disability Modified Rankin: Moderately severe disability       PT Goals(Current goals can be found in the care plan section) Acute Rehab PT Goals PT Goal Formulation: No goals set, d/c therapy  Visit Information  Last PT Received On: 11/30/12 Assistance Needed: +1 History of Present Illness: Diana Walters is an 57 y.o. female with history of AO aneurysm tear S/p repair 2010 and further tear in 2014 with repair at [facility]. Patient was on ASA prior to repair but after surgery she states ASA interfered with her RA medications and she was taken off ASA. This AM 11/29/12 she awoke and noted she was drooling on the left side of her mouth and had dysarthria. Due to these symptoms she was brought to hospital. Initial CT head showed right frontal operculum hypodensity. Follow up MRI brain showed acute nonhemorrhagic infarct involving the right insular cortex and operculum  and multiple remote lacunar infarcts in the left pons and right cerebellum. There  was also notation of Multiple punctate areas of remote hemorrhage scattered throughout the brain. This raises concerning for amyloid  angiopathy. Currently she is NSR, stable and continues to show left facial droop and dysarthria. Patient was not a TPA candidate secondary to waking with symptoms, out of the window. She was admitted for further evaluation and treatment.       Prior Functioning  Home Living Family/patient expects to be discharged to:: Private residence Living Arrangements: Alone Available Help at Discharge: Family;Available PRN/intermittently Type of Home: House Home Access: Stairs to enter [employer] of Steps: 3 Entrance Stairs-Rails: Right Home Layout: One level Home Equipment: Walker - 2 wheels Prior Function Level of Independence: Independent Comments: does not use rw Dominant Hand: Right    Cognition  Cognition Arousal/Alertness: Awake/alert Behavior During Therapy: WFL for tasks assessed/performed Overall Cognitive Status: Within Functional Limits for tasks assessed    Extremity/Trunk Assessment Upper Extremity Assessment Upper Extremity Assessment: Defer to OT evaluation Lower Extremity Assessment Lower Extremity Assessment: Overall WFL for tasks assessed (RLE knee affected by RA)   Balance Balance Balance Assessed: Yes Standardized Balance Assessment Standardized Balance Assessment: Dynamic Gait Index Dynamic Gait Index Level Surface: Mild Impairment (secondary to R knee due to RA) Change in Gait Speed: Normal Gait with Horizontal Head Turns: Normal Gait with Vertical Head Turns: Normal Gait and Pivot Turn: Normal Step Over Obstacle: Normal Step Around Obstacles: Normal Steps: Normal Total Score: 23  End of Session PT - End of Session Equipment Utilized During Treatment: Gait belt Activity Tolerance: Patient tolerated treatment well Patient left: in bed;with call bell/phone within reach;with family/visitor present Nurse Communication: Mobility status  GP     Fabio Asa 11/30/2012, 12:07 PM Charlotte Crumb, PT DPT  [phone]

## 2012-11-30 NOTE — Progress Notes (Signed)
SLP Cancellation Note  Patient Details Name: TORI DATTILIO MRN: [id number] DOB: 08-31-1955   Cancelled treatment:       Reason Eval/Treat Not Completed: Patient at procedure or test/unavailable  Harlon Ditty, MA CCC-SLP [phone]  Claudine Mouton 11/30/2012, 10:33 AM

## 2012-11-30 NOTE — Progress Notes (Signed)
  Echocardiogram 2D Echocardiogram has been performed.  Arvil Chaco 11/30/2012, 10:53 AM

## 2012-11-30 NOTE — Progress Notes (Signed)
OTscreen / sign off Note  Patient Details Name: Diana Walters MRN: [id number] DOB: Nov 11, 1955   Cancelled Treatment:    Reason Eval/Treat Not Completed: OT screened, no needs identified, will sign off. Spoke with patient directly and reports back to baseline and declines OT evaluation. No Ot needs at this time  Harolyn Rutherford Pager: [phone]  11/30/2012, 3:43 PM

## 2012-11-30 NOTE — Progress Notes (Signed)
[facility] PROGRESS NOTE  Diana Walters WUJ:[id number] DOB: 1955-04-21 DOA: 11/29/2012 PCP: Romero Belling, MD  Assessment/Plan: Acute CVA  -MRI of brain pending - CT of head reported right frontal operculum hypodensity, most likely acute infarct given history. - Management per neuro - TEE scheduled for tomorrow 12/01/12  Thoracic aortic aneurysm  -With recently in February of 2014, treated at [facility] with endovascular stent.  -Patient reported conflicted data that her vascular surgeon and ask her to take aspirin but her PCP asked her not to.  - CT angiogram of chest with contrast reported: 1. No interval change in the ascending aorta or proximal great  vessels to explain recent right MCA territory infarct.  2. Status post repair of thoracic aortic stent grafting. No endoleak  seen currently.  Hypertension  -Continue amlodipine, coreg and losartan. - Will monitor closely  -Dyslipidemia  -Continue statin - LDL < 100   Seizure  - No seizure like activity reported.  Patient is on Keppra, continue.   Code Status: full Family Communication: discussed with patient  Disposition Plan: Likely d/c next am 12/01/12 after TEE procedure   Consultants:  Neurology  Procedures:  MRI/MRA head/brain  CT of head  CT angio of chest  Chest x ray  Antibiotics:  None  HPI/Subjective: No new complaints. No acute issues reported overnight.  Objective: Filed Vitals:   11/30/12 1121  BP: 162/53  Pulse: 58  Temp: 98.1 F (36.7 C)  Resp: 16   No intake or output data in the 24 hours ending 11/30/12 1250 Filed Weights   11/29/12 1605  Weight: 65.137 kg (143 lb 9.6 oz)    Exam:   General:  Pt in NAD, Alert and awake  Cardiovascular: Normal S1 and S2 WNL, no mrg  Respiratory: CTA BL, no wheezes  Abdomen: soft, NT, ND  Musculoskeletal: warm and dry   Data Reviewed: Basic Metabolic Panel:  Recent Labs Lab 11/29/12 1100  NA 142  K 3.8  CL 105  CO2 23   GLUCOSE 91  BUN 16  CREATININE 0.62  CALCIUM 9.3   Liver Function Tests:  Recent Labs Lab 11/29/12 1100  AST 15  ALT 7  ALKPHOS 73  BILITOT 0.2*  PROT 7.0  ALBUMIN 3.3*   No results found for this basename: LIPASE, AMYLASE,  in the last 168 hours No results found for this basename: AMMONIA,  in the last 168 hours CBC:  Recent Labs Lab 11/29/12 1100  WBC 11.4*  NEUTROABS 7.1  HGB 12.1  HCT 36.0  MCV 80.7  PLT 247   Cardiac Enzymes:  Recent Labs Lab 11/29/12 1100  TROPONINI <0.30   BNP (last 3 results) No results found for this basename: PROBNP,  in the last 8760 hours CBG:  Recent Labs Lab 11/29/12 1046  GLUCAP 98    No results found for this or any previous visit (from the past 240 hour(s)).   Studies: Dg Chest 2 View  11/29/2012   CLINICAL DATA:  Stroke, aortic valve replacement 2008, post stent grafting of thoracic aortic aneurysm  EXAM: CHEST  2 VIEW  COMPARISON:  Earlier exam of 11/29/2012  FINDINGS: Aortic stent graft extends from aortic arch to the diaphragm along a tortuous and aneurysmal thoracic aorta.  Scattered atherosclerotic calcifications aorta.  Enlargement of cardiac silhouette post median sternotomy.  Pulmonary vascularity normal.  Lungs grossly clear.  No pleural effusion or pneumothorax.  Surgical clips project over the upper anterior right chest.  Bones appear demineralized.  IMPRESSION:  Post stent grafting of a thoracic aortic aneurysm.  Enlargement of cardiac silhouette post median sternotomy.  No definite acute abnormalities.   Electronically Signed   By: Ulyses Southward M.D.   On: 11/29/2012 21:47   Dg Chest 2 View  11/29/2012   CLINICAL DATA:  Facial droop  EXAM: CHEST  2 VIEW  COMPARISON:  05/17/2012  FINDINGS: Thoracic aortic aneurysm has been repaired with multiple stent grafts unchanged in position. Focal bulge in the mid thoracic aorta on the prior study has resolved on this projection. Heart size upper normal. Negative for heart  failure. Negative for infiltrate effusion or edema. Surgical clips in the right axilla are unchanged.  IMPRESSION: No active cardiopulmonary disease.   Electronically Signed   By: Marlan Palau M.D.   On: 11/29/2012 11:44   Ct Head (brain) Wo Contrast  11/29/2012   CLINICAL DATA:  Facial droop, slurred speech  EXAM: CT HEAD WITHOUT CONTRAST  TECHNIQUE: Contiguous axial images were obtained from the base of the skull through the vertex without intravenous contrast.  COMPARISON:  CT 04/08/2007  FINDINGS: Ill-defined hypodensity right frontal operculum, most consistent with acute infarct. This was not present previously.  Ventricle size is normal. Mild chronic microvascular ischemic change in the white matter. Negative for hemorrhage.  Chronic fracture left medial orbit unchanged.  IMPRESSION: Right frontal operculum hypodensity, most likely acute infarct given the history. MRI suggested to confirm and exclude mass lesion.   Electronically Signed   By: Marlan Palau M.D.   On: 11/29/2012 10:41   [name] Head Wo Contrast  11/29/2012   CLINICAL DATA:  Stroke. Difficulty speaking and left facial droop.  EXAM: MRI HEAD WITHOUT CONTRAST  MRA HEAD WITHOUT CONTRAST  TECHNIQUE: Multiplanar, multiecho pulse sequences of the brain and surrounding structures were obtained without intravenous contrast. Angiographic images of the head were obtained using MRA technique without contrast.  COMPARISON:  CT head without contrast 11/29/2012.  FINDINGS: MRI HEAD FINDINGS  The diffusion-weighted images confirm an acute infarct involving in the right insular cortex and frontal operculum. No hemorrhage or mass lesion is associated. T2 hyperintensities are associated with the area of acute infarction. No other acute infarcts are present. The ventricles are of normal size. A remote lacunar infarct is present in the left coronal radiata. Additional remote lacunar infarction noted within the right cerebellum and left pons. Mild  periventricular white matter and hyperintensity is present bilaterally. And scattered punctate areas of susceptibility are present over the convexities bilaterally. Additional foci present in the cerebellum and occipital lobes. There is a punctate area of remote hemorrhage in the medial right temporal lobe. The most prominent area is in the inferior vermis. No significant extra-axial fluid collections are present.  Flow is present in the major intracranial arteries. The globes and orbits are intact. Minimal mucosal thickening is noted in the left maxillary sinus. A remote medial left orbital blow-out fracture is evident. The paranasal sinuses and mastoid air cells are otherwise clear.  MRA HEAD FINDINGS  The internal carotid arteries and are within normal limits from the high cervical segments through the ICA termini bilaterally. Prominent posterior communicating arteries are present. The A1 and M1 segments are within normal limits. The left A1 segment is dominant. The anterior communicating artery is not clearly visualized. There is mild attenuation of more distal MCA branch vessels bilaterally.  Signal loss in the proximal left vertebral artery and represents either extremely slow flow or proximal occlusion. The right PICA origin is visualized  and normal. The vertebrobasilar junction is within normal limits. The basilar artery is small. The right posterior cerebral artery is of fetal type. The left posterior cerebral artery receive contribution both the left P1 segment and a posterior communicating artery. There is asymmetric attenuation of distal PCA branch vessels on the left.  IMPRESSION: MRI HEAD IMPRESSION  1. 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 2. Multiple remote lacunar infarcts involving the (radiata, left pons, and right cerebellum. 3. Multiple punctate areas of remote hemorrhage scattered throughout the brain. This raises concerning for amyloid angiopathy.  MRA  HEAD IMPRESSION  1. 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 2. Mild distal small vessel disease as described.   Electronically Signed   By: Gennette Pac M.D.   On: 11/29/2012 12:33   Ct Angio Chest Aortic Dissect W &/or W/o  11/30/2012   CLINICAL DATA:  Recent history of abdominal aortic aneurysm leak and end stenting. Admitted with infarcts, rule out clots.  EXAM: CT ANGIOGRAPHY CHEST WITH CONTRAST  TECHNIQUE: Multidetector CT imaging of the chest was performed using the standard protocol during bolus administration of intravenous contrast. Multiplanar CT image reconstructions including MIPs were obtained to evaluate the vascular anatomy.  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 Generous sized thyroid gland.  MEDIASTINUM:  Status post revision of stent grafting of the thoracic aortic aneurysm, extending from the arch to the level of the aortic hiatus. There has been application of a 2nd layer of stents, and further coverage inferiorly. Status post re-implantation of the great vessels (brachiocephalic and left common carotid arteries) onto the ascending aorta. This anastomosis appears unchanged. No evidence of dissection proximal to the re- implantation (the aortic root) to serve as a thrombotic source. There is minimal high density along the outer wall of the re-implanted trunk, which is likely atherosclerotic or postsurgical. No new narrowing of this vessel to suggest dissection. The left subclavian artery remains covered by graft, but it continues to fill in the arterial phase. This correlates with slow flow in the left vertebral on MRA 11/29/2012. No further in the leak is seen in the arterial phase. High-density material within the lower thoracic aneurysm sac was present previously, and is present prior to contrast  administration.  No cardiomegaly or pericardial effusion. Diffuse coronary artery atherosclerosis.  No adenopathy.  LUNG WINDOWS:  Left-sided pleural changes compatible with postoperative scarring, mild. There is mild atelectasis in the lower lungs. No consolidation or overt edema.  UPPER ABDOMEN:  No acute findings.  OSSEOUS:  No acute fracture.  No suspicious lytic or blastic lesions.  Review of the MIP images confirms the above findings.  IMPRESSION: 1. No interval change in the ascending aorta or proximal great vessels to explain recent right MCA territory infarct. 2. Status post repair of thoracic aortic stent grafting. No endoleak seen currently.   Electronically Signed   By: Tiburcio Pea M.D.   On: 11/30/2012 03:32    Scheduled Meds: . amLODipine  5 mg Oral Daily  . carvedilol  3.125 mg Oral BID WC  . clopidogrel  75 mg Oral Q breakfast  . enoxaparin (LOVENOX) injection  40 mg Subcutaneous Q24H  . levETIRAcetam  1,000 mg Oral BID  . losartan  50 mg Oral Daily  . [START ON 12/04/2012] methotrexate  10 mg Oral Q  Mon  . Melene Muller ON 12/01/2012] pneumococcal 23 valent vaccine  0.5 mL Intramuscular Tomorrow-1000  . predniSONE  5 mg Oral Q breakfast  . simvastatin  20 mg Oral QPM   Continuous Infusions:   Principal Problem:   Acute CVA (cerebrovascular accident) Active Problems:   HYPERCHOLESTEROLEMIA   HYPERTENSION   THORACIC AORTIC ANEURYSM   Rheumatoid arthritis(714.0)   SEIZURE DISORDER    Time spent: > 35 minutes    Penny Pia  [facility] Pager [phone] If 7PM-7AM, please contact night-coverage at [email or web address], password [facility] 11/30/2012, 12:50 PM  LOS: 1 day

## 2012-11-30 NOTE — Progress Notes (Signed)
Stroke Team Progress Note  HISTORY Diana Walters is an 57 y.o. female with history of AO aneurysm tear S/p repair 2010 and further tear in 2014 with repair at [facility]. Patient was on ASA prior to repair but after surgery she states ASA interfered with her RA medications and she was taken off ASA. This AM 11/29/12 she awoke and noted she was drooling on the left side of her mouth and had dysarthria. Due to these symptoms she was brought to hospital. Initial CT head showed right frontal operculum hypodensity. 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 There  was also notation of Multiple punctate areas of remote hemorrhage scattered throughout the brain. This raises concerning for amyloid angiopathy. Currently she is NSR, stable and continues to show left facial droop and dysarthria. Patient was not a TPA candidate secondary to waking with symptoms, out of the window. She was admitted for further evaluation and treatment.  SUBJECTIVE Her sister is at the bedside.  Overall she feels her condition is stable.   OBJECTIVE Most recent Vital Signs: Filed Vitals:   11/29/12 2205 11/30/12 0000 11/30/12 0200 11/30/12 0600  BP: 160/63 160/69 149/68 166/71  Pulse: 68 58 67 69  Temp: 98.8 F (37.1 C) 98.8 F (37.1 C) 98.2 F (36.8 C) 98.8 F (37.1 C)  TempSrc:      Resp: 16 16 16 16   Height:      Weight:      SpO2: 97% 100% 98% 100%   CBG (last 3)   Recent Labs  11/29/12 1046  GLUCAP 98    IV Fluid Intake:     MEDICATIONS  . amLODipine  5 mg Oral Daily  . aspirin  300 mg Rectal Daily   Or  . aspirin  325 mg Oral Daily  . carvedilol  3.125 mg Oral BID WC  . enoxaparin (LOVENOX) injection  40 mg Subcutaneous Q24H  . levETIRAcetam  1,000 mg Oral BID  . losartan  50 mg Oral Daily  . [START ON 12/04/2012] methotrexate  10 mg Oral Q Mon  . [START ON  12/01/2012] pneumococcal 23 valent vaccine  0.5 mL Intramuscular Tomorrow-1000  . predniSONE  5 mg Oral Q breakfast  . simvastatin  20 mg Oral QPM   PRN:    Diet:  Cardiac thin liquids Activity:  Bedrest, as tolerated DVT Prophylaxis:  Lovenox 40 mg sq daily   CLINICALLY SIGNIFICANT STUDIES Basic Metabolic Panel:   Recent Labs Lab 11/29/12 1100  NA 142  K 3.8  CL 105  CO2 23  GLUCOSE 91  BUN 16  CREATININE 0.62  CALCIUM 9.3   Liver Function Tests:   Recent Labs Lab 11/29/12 1100  AST 15  ALT 7  ALKPHOS 73  BILITOT 0.2*  PROT 7.0  ALBUMIN 3.3*   CBC:   Recent Labs Lab 11/29/12 1100  WBC 11.4*  NEUTROABS 7.1  HGB 12.1  HCT 36.0  MCV 80.7  PLT 247   Coagulation:   Recent Labs Lab 11/29/12 1100  LABPROT 12.6  INR 0.96   Cardiac Enzymes:   Recent Labs Lab 11/29/12 1100  TROPONINI <0.30   Urinalysis: No results found for this basename: COLORURINE, APPERANCEUR, LABSPEC, PHURINE, GLUCOSEU, HGBUR, BILIRUBINUR, KETONESUR, PROTEINUR, UROBILINOGEN, NITRITE, LEUKOCYTESUR,  in the last 168 hours Lipid Panel    Component Value Date/Time   CHOL 184 11/30/2012 0553  TRIG 63 11/30/2012 0553   HDL 74 11/30/2012 0553   CHOLHDL 2.5 11/30/2012 0553   VLDL 13 11/30/2012 0553   LDLCALC 97 11/30/2012 0553   HgbA1C  No results found for this basename: HGBA1C    Urine Drug Screen:     Component Value Date/Time   LABOPIA NONE DETECTED 04/08/2007 1402   COCAINSCRNUR NONE DETECTED 04/08/2007 1402   LABBENZ NONE DETECTED 04/08/2007 1402   AMPHETMU NONE DETECTED 04/08/2007 1402   THCU POSITIVE* 04/08/2007 1402   LABBARB  Value: NONE DETECTED        DRUG SCREEN FOR MEDICAL PURPOSES ONLY.  IF CONFIRMATION IS NEEDED FOR ANY PURPOSE, NOTIFY LAB WITHIN 5 DAYS. 04/08/2007 1402    Alcohol Level: No results found for this basename: ETH,  in the last 168 hours        Ct Angio Chest Aortic Dissect W &/or W/o  11/30/2012   1. No interval change in the ascending aorta or  proximal great vessels to explain recent right MCA territory infarct. 2. Status post repair of thoracic aortic stent grafting. No endoleak seen currently.    CT of the brain  11/29/2012  Right frontal operculum hypodensity, most likely acute infarct given the history.   MRI of the brain  11/28/2012  1. 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 2. Multiple remote lacunar infarcts involving the (radiata, left pons, and right cerebellum. 3. Multiple punctate areas of remote hemorrhage scattered throughout the brain. This raises concerning for amyloid angiopathy.  MRA of the brain  11/29/2012  1. 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 2. Mild distal small vessel disease  2D Echocardiogram    Carotid Doppler    CXR  11/29/2012    Post stent grafting of a thoracic aortic aneurysm.  Enlargement of cardiac silhouette post median sternotomy.  No definite acute abnormalities.  11/29/2012   No active cardiopulmonary disease.    EKG     Therapy Recommendations   Physical Exam   Pleasant middle aged african american lady not in distress.Awake alert. Afebrile. Head is nontraumatic. Neck is supple without bruit. Hearing is normal. Cardiac exam no murmur or gallop. Lungs are clear to auscultation. Distal pulses are well felt. Neurological Exam :Awake alert oriented x 3 normal speech and language except mild dysarthria only. Mild left lower face asymmetry. Tongue midline. No drift. Mild diminished fine finger movements on left. Orbits right over left upper extremity. Mild left grip weak.. Normal sensation . Normal coordination. ASSESSMENT Diana Walters is a 57 y.o. female presenting with left facial weakness and dysarthria. Imaging confirms a right insular cortex infarct. Infarct felt to be embolic secondary to unknown source.  Patient also with old lacunar infarcts due to small vessel disease. Multiple microhemorrhages due to hypertensive  small vessel disease. On no antithrombotics prior to admission. Now on aspirin 325 mg orally every day for secondary stroke prevention. Work up underway.  Hypertension Hyperlipidemia, LDL 97, on zocor 20 PTA, now on zocor 20, at goal LDL < 100 Rheumatoid Arthritis Hx AAA aneurysm w/ tear, repaired 2010 and 2014 Seizure disorder on keppra  Hospital day # 1  TREATMENT/PLAN  Change to clopidogrel 75 mg orally every day for secondary stroke prevention due to pt preference not to take asprin. TEE to look for embolic source. Arranged with [facility] for tomorrow.  If positive for PFO (patent foramen ovale), check bilateral lower extremity venous dopplers to rule out DVT  as possible source of stroke. I made NPO after midnight. If TEE negative, [place] cardiologist will place implantable loop recorder to evaluate for atrial fibrillation as etiology of stroke. This has been explained to patient/family by Dr. Pearlean Brownie and they are agreeable. Ok for discharge tomorrow post TEE Therapy follow up per their recs.  Annie Main, MSN, RN, ANVP-BC, ANP-BC, [facility] Pager: [phone] 11/30/2012 10:37 AM  I have personally obtained a history, examined the patient, evaluated imaging results, and formulated the assessment and plan of care. I agree with the above. Delia Heady, MD

## 2012-12-01 ENCOUNTER — Encounter (HOSPITAL_COMMUNITY): Payer: Self-pay | Admitting: *Deleted

## 2012-12-01 ENCOUNTER — Encounter (HOSPITAL_COMMUNITY)
Admission: EM | Disposition: A | Discharge status: Home / Self Care | Payer: Self-pay | Source: Home / Self Care | Attending: Family Medicine

## 2012-12-01 DIAGNOSIS — M79609 Pain in unspecified limb: Secondary | ICD-10-CM

## 2012-12-01 DIAGNOSIS — I6789 Other cerebrovascular disease: Secondary | ICD-10-CM

## 2012-12-01 DIAGNOSIS — I712 Thoracic aortic aneurysm, without rupture: Secondary | ICD-10-CM

## 2012-12-01 DIAGNOSIS — I6350 Cerebral infarction due to unspecified occlusion or stenosis of unspecified cerebral artery: Secondary | ICD-10-CM

## 2012-12-01 HISTORY — PX: TEE WITHOUT CARDIOVERSION: SHX5443

## 2012-12-01 HISTORY — PX: LOOP RECORDER IMPLANT: SHX5477

## 2012-12-01 HISTORY — PX: LOOP RECORDER IMPLANT: SHX5954

## 2012-12-01 SURGERY — LOOP RECORDER IMPLANT
Anesthesia: LOCAL

## 2012-12-01 SURGERY — ECHOCARDIOGRAM, TRANSESOPHAGEAL
Anesthesia: Moderate Sedation

## 2012-12-01 MED ORDER — DIPHENHYDRAMINE HCL 50 MG/ML IJ SOLN
INTRAMUSCULAR | Status: AC
  Filled 2012-12-01: qty 1

## 2012-12-01 MED ORDER — FENTANYL CITRATE 0.05 MG/ML IJ SOLN
INTRAMUSCULAR | Status: AC
  Filled 2012-12-01: qty 2

## 2012-12-01 MED ORDER — MIDAZOLAM HCL 5 MG/ML IJ SOLN
INTRAMUSCULAR | Status: AC
  Filled 2012-12-01: qty 1

## 2012-12-01 MED ORDER — MIDAZOLAM HCL 5 MG/ML IJ SOLN
INTRAMUSCULAR | Status: AC
  Filled 2012-12-01: qty 2

## 2012-12-01 MED ORDER — MIDAZOLAM HCL 10 MG/2ML IJ SOLN
INTRAMUSCULAR | Status: DC | PRN
  Administered 2012-12-01 (×6): 2 mg via INTRAVENOUS

## 2012-12-01 MED ORDER — LIDOCAINE VISCOUS 2 % MT SOLN
OROMUCOSAL | Status: AC
  Filled 2012-12-01: qty 15

## 2012-12-01 MED ORDER — FENTANYL CITRATE 0.05 MG/ML IJ SOLN
INTRAMUSCULAR | Status: DC | PRN
  Administered 2012-12-01: 25 ug via INTRAVENOUS
  Administered 2012-12-01 (×2): 12.5 ug via INTRAVENOUS
  Administered 2012-12-01 (×3): 25 ug via INTRAVENOUS

## 2012-12-01 MED ORDER — LIDOCAINE-EPINEPHRINE 1 %-1:100000 IJ SOLN
INTRAMUSCULAR | Status: AC
  Filled 2012-12-01: qty 1

## 2012-12-01 MED ORDER — LIDOCAINE VISCOUS 2 % MT SOLN
OROMUCOSAL | Status: DC | PRN
  Administered 2012-12-01: 20 mL via OROMUCOSAL

## 2012-12-01 MED ORDER — CLOPIDOGREL BISULFATE 75 MG PO TABS: 75.0000 mg | ORAL_TABLET | Freq: Every day | ORAL | Status: DC

## 2012-12-01 MED ORDER — DIPHENHYDRAMINE HCL 50 MG/ML IJ SOLN
INTRAMUSCULAR | Status: DC | PRN
  Administered 2012-12-01: 25 mg via INTRAVENOUS

## 2012-12-01 NOTE — Discharge Summary (Signed)
Physician Discharge Summary  Diana Walters:[id number] DOB: 1955/03/16 DOA: 11/29/2012  PCP: Romero Belling, MD  Admit date: 11/29/2012 Discharge date: 12/01/2012  Time spent: > 35  minutes  Recommendations for Outpatient Follow-up:  1. Please follow up with Dr. Pearlean Brownie in 1 month 2. Also will need to follow up with Cardiology for interpretation of information obtained from loop recorder.  Discharge Diagnoses:  Principal Problem:   Acute CVA (cerebrovascular accident) Active Problems:   HYPERCHOLESTEROLEMIA   HYPERTENSION   THORACIC AORTIC ANEURYSM   Rheumatoid arthritis(714.0)   SEIZURE DISORDER   Discharge Condition: stable  Diet recommendation: Low sodium heart healthy  Filed Weights   11/29/12 1605  Weight: 65.137 kg (143 lb 9.6 oz)    History of present illness:  From original HPI: Diana Walters is a 57 y.o. female with past medical history of thoracic aortic aneurysm status post endovascular stent done in [facility] in February of 2014. Patient also has history of hypertension, dyslipidemia and rheumatoid arthritis. According to the patient she's not taking aspirin since February. Patient was in her usual state of health till about 11 PM last night when she noticed a lot of secretion the back of her throat, and she realized that she is having difficulty with speech. This morning when she talked to her daughter she advised her to come to the hospital because of slurred speech.  Hospital Course:   Acute CVA :  Acute nonhemorrhagic infarct involving the right insular cortex and operculum. -Neurology evaluated and recommended the following: Continue clopidogrel 75 mg orally every day for secondary stroke prevention due to pt preference not to take asprin. TEE to look for embolic source. Arranged with [facility] Cardiology for today. If positive for PFO (patent foramen ovale), check bilateral lower extremity venous dopplers to rule out DVT as possible source of stroke. I made NPO  after midnight.  If TEE negative, [place] cardiologist will place implantable loop recorder to evaluate for atrial fibrillation as etiology of stroke. This has been explained to patient/family by Dr. Pearlean Brownie and they are agreeable.  Ok for discharge post TEE  No Therapy needs  If TEE unrevealing, please schedule an outpatient TCD bubble study with emboli monitoring with Dr. Pearlean Brownie in 1 month to further evaluate for possible PFO. Have patient call for appointment. (I have added to discharge instruction sheet).   Thoracic aortic aneurysm  -With recently in February of 2014, treated at [facility] with endovascular stent.  -Patient reported conflicted data that her vascular surgeon and ask her to take aspirin but her PCP asked her not to.  - CT angiogram of chest with contrast reported:  1. No interval change in the ascending aorta or proximal great  vessels to explain recent right MCA territory infarct.  2. Status post repair of thoracic aortic stent grafting. No endoleak  seen currently.   Hypertension  -Continue amlodipine, coreg and losartan.  - Will monitor closely   -Dyslipidemia  -Continue statin  - LDL < 100   Seizure  - No seizure like activity reported. Patient is on Keppra, continue.   Procedures: Ct Angio Chest Aortic Dissect W &/or W/o 11/30/2012 1. No interval change in the ascending aorta or proximal great vessels to explain recent right MCA territory infarct. 2. Status post repair of thoracic aortic stent grafting. No endoleak seen currently.  CT of the brain 11/29/2012 Right frontal operculum hypodensity, most likely acute infarct given the history.  MRI of the brain 11/28/2012 1. Acute nonhemorrhagic infarct involving  the right insular cortex and operculum. 2. Multiple remote lacunar infarcts involving the (radiata, left pons, and right cerebellum. 3. Multiple punctate areas of remote hemorrhage scattered throughout the brain. This raises concerning for amyloid angiopathy.  MRA of  the brain 11/29/2012 1. 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 2. Mild distal small vessel disease  2D Echocardiogram EF 60-65% with no source of embolus. Moderate LVH, somewhat asymmetric septal hypertrophy but no obvious LVOT gradient. LVEF 60-65%, grade 1 diastolic dysfunction. Upper normal left and right atrial size. Reported aortic stent graft not well seen as noted. No iobvious PFO or ASD. Cannot assess PASP. CVP estimated in normal range.  Carotid Doppler There is 1-39% ICA stenosis. Right vertebral artery flow is antegrade. Left vertebral artery flow is retrograde. Minimal plaque noted.  CXR 11/29/2012 Post stent grafting of a thoracic aortic aneurysm. Enlargement of cardiac silhouette post median sternotomy. No definite acute abnormalities.  11/29/2012 No active cardiopulmonary disease  Preliminary TEE PFO present as tested with injection of agitated saline.  No LAA thrombus.  Normal LV function.  Aortic graft noted.  Bilateral lower extremity venous duplex: No evidence of DVT, superficial thrombosis, or Baker's Cyst  Consultations:  Neurology  Discharge Exam: Filed Vitals:   12/01/12 1225  BP: 154/67  Pulse: 66  Temp:   Resp: 21    General: Pt in NAD, arrousable  Cardiovascular: RRR, no rubs Respiratory: CTA BL, no wheezes  Discharge Instructions  Discharge Orders   Future Appointments Provider Department Dept Phone   07/06/2013 10:15 AM Romero Belling, MD [facility] [phone]   Future Orders Complete By Expires   Call MD for:  redness, tenderness, or signs of infection (pain, swelling, redness, odor or green/yellow discharge around incision site)  As directed    Call MD for:  temperature >100.4  As directed    Diet - low sodium heart healthy  As directed    Discharge instructions  As directed    Comments:     You will need to f/u with Neurology in 1 month.  Also follow up with [facility] cardiology for placement  of implantable loop recorder to evaluate for atrial fibrillation as etiology of stroke   Increase activity slowly  As directed        Medication List         amLODipine 5 MG tablet  Commonly known as:  NORVASC  Take 5 mg by mouth daily.     carvedilol 3.125 MG tablet  Commonly known as:  COREG  Take 3.125 mg by mouth 2 (two) times daily with a meal.     clopidogrel 75 MG tablet  Commonly known as:  PLAVIX  Take 1 tablet (75 mg total) by mouth daily with breakfast.     HYDROcodone-acetaminophen 10-325 MG per tablet  Commonly known as:  NORCO  Take 1 tablet by mouth every 6 (six) hours as needed for pain.     levETIRAcetam 1000 MG tablet  Commonly known as:  KEPPRA  Take 1,000 mg by mouth 2 (two) times daily.     losartan 50 MG tablet  Commonly known as:  COZAAR  Take 1 tablet (50 mg total) by mouth daily.     methotrexate 2.5 MG tablet  Take 10 mg by mouth once a week.     multivitamin tablet  Take 1 tablet by mouth daily.     predniSONE 5 MG tablet  Commonly known as:  DELTASONE  Take  5 mg by mouth daily.     REMICADE IV  Inject into the vein. Due the 28th of October    At rheumatology     simvastatin 20 MG tablet  Commonly known as:  ZOCOR  Take 20 mg by mouth every evening.       No Known Allergies     Follow-up Information   Follow up with Gates Rigg, MD. Schedule an appointment as soon as possible for a visit in 1 month. (ask for a TCD bubble study and emboli monitoring)    Specialties:  Neurology, Radiology   Contact information:   [address] [place] [address] [phone]        The results of significant diagnostics from this hospitalization (including imaging, microbiology, ancillary and laboratory) are listed below for reference.    Significant Diagnostic Studies: Dg Chest 2 View  11/29/2012   CLINICAL DATA:  Stroke, aortic valve replacement 2008, post stent grafting of thoracic aortic aneurysm  EXAM: CHEST  2  VIEW  COMPARISON:  Earlier exam of 11/29/2012  FINDINGS: Aortic stent graft extends from aortic arch to the diaphragm along a tortuous and aneurysmal thoracic aorta.  Scattered atherosclerotic calcifications aorta.  Enlargement of cardiac silhouette post median sternotomy.  Pulmonary vascularity normal.  Lungs grossly clear.  No pleural effusion or pneumothorax.  Surgical clips project over the upper anterior right chest.  Bones appear demineralized.  IMPRESSION: Post stent grafting of a thoracic aortic aneurysm.  Enlargement of cardiac silhouette post median sternotomy.  No definite acute abnormalities.   Electronically Signed   By: Ulyses Southward M.D.   On: 11/29/2012 21:47   Dg Chest 2 View  11/29/2012   CLINICAL DATA:  Facial droop  EXAM: CHEST  2 VIEW  COMPARISON:  05/17/2012  FINDINGS: Thoracic aortic aneurysm has been repaired with multiple stent grafts unchanged in position. Focal bulge in the mid thoracic aorta on the prior study has resolved on this projection. Heart size upper normal. Negative for heart failure. Negative for infiltrate effusion or edema. Surgical clips in the right axilla are unchanged.  IMPRESSION: No active cardiopulmonary disease.   Electronically Signed   By: Marlan Palau M.D.   On: 11/29/2012 11:44   Ct Head (brain) Wo Contrast  11/29/2012   CLINICAL DATA:  Facial droop, slurred speech  EXAM: CT HEAD WITHOUT CONTRAST  TECHNIQUE: Contiguous axial images were obtained from the base of the skull through the vertex without intravenous contrast.  COMPARISON:  CT 04/08/2007  FINDINGS: Ill-defined hypodensity right frontal operculum, most consistent with acute infarct. This was not present previously.  Ventricle size is normal. Mild chronic microvascular ischemic change in the white matter. Negative for hemorrhage.  Chronic fracture left medial orbit unchanged.  IMPRESSION: Right frontal operculum hypodensity, most likely acute infarct given the history. MRI suggested to confirm and  exclude mass lesion.   Electronically Signed   By: Marlan Palau M.D.   On: 11/29/2012 10:41   [name] Head Wo Contrast  11/29/2012   CLINICAL DATA:  Stroke. Difficulty speaking and left facial droop.  EXAM: MRI HEAD WITHOUT CONTRAST  MRA HEAD WITHOUT CONTRAST  TECHNIQUE: Multiplanar, multiecho pulse sequences of the brain and surrounding structures were obtained without intravenous contrast. Angiographic images of the head were obtained using MRA technique without contrast.  COMPARISON:  CT head without contrast 11/29/2012.  FINDINGS: MRI HEAD FINDINGS  The diffusion-weighted images confirm an acute infarct involving in the right insular cortex and frontal  operculum. No hemorrhage or mass lesion is associated. T2 hyperintensities are associated with the area of acute infarction. No other acute infarcts are present. The ventricles are of normal size. A remote lacunar infarct is present in the left coronal radiata. Additional remote lacunar infarction noted within the right cerebellum and left pons. Mild periventricular white matter and hyperintensity is present bilaterally. And scattered punctate areas of susceptibility are present over the convexities bilaterally. Additional foci present in the cerebellum and occipital lobes. There is a punctate area of remote hemorrhage in the medial right temporal lobe. The most prominent area is in the inferior vermis. No significant extra-axial fluid collections are present.  Flow is present in the major intracranial arteries. The globes and orbits are intact. Minimal mucosal thickening is noted in the left maxillary sinus. A remote medial left orbital blow-out fracture is evident. The paranasal sinuses and mastoid air cells are otherwise clear.  MRA HEAD FINDINGS  The internal carotid arteries and are within normal limits from the high cervical segments through the ICA termini bilaterally. Prominent posterior communicating arteries are present. The A1 and M1 segments are  within normal limits. The left A1 segment is dominant. The anterior communicating artery is not clearly visualized. There is mild attenuation of more distal MCA branch vessels bilaterally.  Signal loss in the proximal left vertebral artery and represents either extremely slow flow or proximal occlusion. The right PICA origin is visualized and normal. The vertebrobasilar junction is within normal limits. The basilar artery is small. The right posterior cerebral artery is of fetal type. The left posterior cerebral artery receive contribution both the left P1 segment and a posterior communicating artery. There is asymmetric attenuation of distal PCA branch vessels on the left.  IMPRESSION: MRI HEAD IMPRESSION  1. 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 2. Multiple remote lacunar infarcts involving the (radiata, left pons, and right cerebellum. 3. Multiple punctate areas of remote hemorrhage scattered throughout the brain. This raises concerning for amyloid angiopathy.  MRA HEAD IMPRESSION  1. 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 2. Mild distal small vessel disease as described.   Electronically Signed   By: Gennette Pac M.D.   On: 11/29/2012 12:33   Mr Brain Wo Contrast  11/30/2012   CLINICAL DATA:  Stroke. Difficulty speaking and left facial droop.  EXAM: MRI HEAD WITHOUT CONTRAST  MRA HEAD WITHOUT CONTRAST  TECHNIQUE: Multiplanar, multiecho pulse sequences of the brain and surrounding structures were obtained without intravenous contrast. Angiographic images of the head were obtained using MRA technique without contrast.  COMPARISON:  CT head without contrast 11/29/2012.  FINDINGS: MRI HEAD FINDINGS  The diffusion-weighted images confirm an acute infarct involving in the right insular cortex and frontal operculum. No hemorrhage or mass lesion is associated. T2 hyperintensities are associated with the area of acute infarction. No other acute  infarcts are present. The ventricles are of normal size. A remote lacunar infarct is present in the left coronal radiata. Additional remote lacunar infarction noted within the right cerebellum and left pons. Mild periventricular white matter and hyperintensity is present bilaterally. And scattered punctate areas of susceptibility are present over the convexities bilaterally. Additional foci present in the cerebellum and occipital lobes. There is a punctate area of remote hemorrhage in the medial right temporal lobe. The most prominent area is in the inferior vermis. No significant extra-axial fluid collections are present.  Flow is present in the major intracranial arteries. The globes  and orbits are intact. Minimal mucosal thickening is noted in the left maxillary sinus. A remote medial left orbital blow-out fracture is evident. The paranasal sinuses and mastoid air cells are otherwise clear.  MRA HEAD FINDINGS  The internal carotid arteries and are within normal limits from the high cervical segments through the ICA termini bilaterally. Prominent posterior communicating arteries are present. The A1 and M1 segments are within normal limits. The left A1 segment is dominant. The anterior communicating artery is not clearly visualized. There is mild attenuation of more distal MCA branch vessels bilaterally.  Signal loss in the proximal left vertebral artery and represents either extremely slow flow or proximal occlusion. The right PICA origin is visualized and normal. The vertebrobasilar junction is within normal limits. The basilar artery is small. The right posterior cerebral artery is of fetal type. The left posterior cerebral artery receive contribution both the left P1 segment and a posterior communicating artery. There is asymmetric attenuation of distal PCA branch vessels on the left.  IMPRESSION: MRI HEAD IMPRESSION  1. 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 2. Multiple  remote lacunar infarcts involving the (radiata, left pons, and right cerebellum. 3. Multiple punctate areas of remote hemorrhage scattered throughout the brain. This raises concerning for amyloid angiopathy.  MRA HEAD IMPRESSION  1. 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 2. Mild distal small vessel disease as described.   Electronically Signed   By: Gennette Pac M.D.   On: 11/30/2012 16:23   Ct Angio Chest Aortic Dissect W &/or W/o  11/30/2012   CLINICAL DATA:  Recent history of abdominal aortic aneurysm leak and end stenting. Admitted with infarcts, rule out clots.  EXAM: CT ANGIOGRAPHY CHEST WITH CONTRAST  TECHNIQUE: Multidetector CT imaging of the chest was performed using the standard protocol during bolus administration of intravenous contrast. Multiplanar CT image reconstructions including MIPs were obtained to evaluate the vascular anatomy.  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 Generous sized thyroid gland.  MEDIASTINUM:  Status post revision of stent grafting of the thoracic aortic aneurysm, extending from the arch to the level of the aortic hiatus. There has been application of a 2nd layer of stents, and further coverage inferiorly. Status post re-implantation of the great vessels (brachiocephalic and left common carotid arteries) onto the ascending aorta. This anastomosis appears unchanged. No evidence of dissection proximal to the re- implantation (the aortic root) to serve as a thrombotic source. There is minimal high density along the outer wall of the re-implanted trunk, which is likely atherosclerotic or postsurgical. No new narrowing of this vessel to suggest dissection. The left subclavian artery remains covered by graft, but it continues to fill in the arterial phase. This correlates with slow flow in  the left vertebral on MRA 11/29/2012. No further in the leak is seen in the arterial phase. High-density material within the lower thoracic aneurysm sac was present previously, and is present prior to contrast administration.  No cardiomegaly or pericardial effusion. Diffuse coronary artery atherosclerosis.  No adenopathy.  LUNG WINDOWS:  Left-sided pleural changes compatible with postoperative scarring, mild. There is mild atelectasis in the lower lungs. No consolidation or overt edema.  UPPER ABDOMEN:  No acute findings.  OSSEOUS:  No acute fracture.  No suspicious lytic or blastic lesions.  Review of the MIP images  confirms the above findings.  IMPRESSION: 1. No interval change in the ascending aorta or proximal great vessels to explain recent right MCA territory infarct. 2. Status post repair of thoracic aortic stent grafting. No endoleak seen currently.   Electronically Signed   By: Tiburcio Pea M.D.   On: 11/30/2012 03:32    Microbiology: No results found for this or any previous visit (from the past 240 hour(s)).   Labs: Basic Metabolic Panel:  Recent Labs Lab 11/29/12 1100  NA 142  K 3.8  CL 105  CO2 23  GLUCOSE 91  BUN 16  CREATININE 0.62  CALCIUM 9.3   Liver Function Tests:  Recent Labs Lab 11/29/12 1100  AST 15  ALT 7  ALKPHOS 73  BILITOT 0.2*  PROT 7.0  ALBUMIN 3.3*   No results found for this basename: LIPASE, AMYLASE,  in the last 168 hours No results found for this basename: AMMONIA,  in the last 168 hours CBC:  Recent Labs Lab 11/29/12 1100  WBC 11.4*  NEUTROABS 7.1  HGB 12.1  HCT 36.0  MCV 80.7  PLT 247   Cardiac Enzymes:  Recent Labs Lab 11/29/12 1100  TROPONINI <0.30   BNP: BNP (last 3 results) No results found for this basename: PROBNP,  in the last 8760 hours CBG:  Recent Labs Lab 11/29/12 1046  GLUCAP 98       Signed:  Penny Pia  [facility] 12/01/2012, 2:04 PM

## 2012-12-01 NOTE — H&P (View-Only) (Signed)
ELECTROPHYSIOLOGY CONSULT NOTE  Patient ID: Diana Walters MRN: [id number], DOB/AGE: 59-Jan-1957   Admit date: 11/29/2012 Date of Consult: 12/01/2012  Primary Physician: Romero Belling, MD Primary Cardiologist: new to [facility] Reason for Consultation: Cryptogenic stroke; recommendations regarding Implantable Loop Recorder  History of Present Illness Diana Walters was admitted on 11/29/2012 with left facial weakness and dysarthria.  She was found to have an acute CVA. she has been monitored on telemetry which has demonstrated no arrhythmias. No cause has been identified. Inpatient stroke work-up is to be completed with a TEE. EP has been asked to evaluate for placement of an implantable loop recorder to monitor for atrial fibrillation.  She report intermittent palpitations for several years. These are self terminating and cause minimal symptoms.  She has never had syncope or pre-syncope.   Past Medical History Past Medical History  Diagnosis Date  . HYPERCHOLESTEROLEMIA 04/01/2008  . ASTHMA 10/28/2006  . ANXIETY 10/28/2006  . ANEMIA 10/17/2009  . HYPERTENSION 10/28/2006  . 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 SEIZURE DISORDER 04/01/2008  . Menopause     age 59  . Duodenitis without mention of hemorrhage 2005    Past Surgical History Past Surgical History  Procedure Laterality Date  . Esophagogastroduodenoscopy  01/29/2004    Allergies/Intolerances No Known Allergies Inpatient Medications . amLODipine  5 mg Oral Daily  . carvedilol  3.125 mg Oral BID WC  . clopidogrel  75 mg Oral Q breakfast  . enoxaparin (LOVENOX) injection  40 mg Subcutaneous Q24H  . levETIRAcetam  1,000 mg Oral BID  . losartan  50 mg Oral Daily  . [START ON 12/04/2012] methotrexate  10 mg Oral Q Mon  . pneumococcal 23 valent vaccine  0.5 mL Intramuscular Tomorrow-1000  . predniSONE  5 mg Oral Q breakfast  . simvastatin  20 mg Oral QPM     Social History History   Social History  . Marital  Status: Single    Spouse Name: N/A    Number of Children: N/A  . Years of Education: N/A   Occupational History  . Not on file.   Social History Main Topics  . Smoking status: Former Smoker    Types: Cigarettes  . Smokeless tobacco: Never Used  . Alcohol Use: No  . Drug Use: No  . Sexual Activity: Not on file   Other Topics Concern  . Not on file   Social History Narrative  . No narrative on file    Review of Systems General: No chills, fever, night sweats or weight changes  Cardiovascular:  No chest pain, dyspnea on exertion, edema, orthopnea, paroxysmal nocturnal dyspnea Dermatological: No rash, lesions or masses Respiratory: No cough, dyspnea Urologic: No hematuria, dysuria Abdominal: No nausea, vomiting, diarrhea, bright red blood per rectum, melena, or hematemesis Neurologic: No visual changes, weakness, changes in mental status All other systems reviewed and are otherwise negative except as noted above.  Physical Exam Blood pressure 159/75, pulse 53, temperature 98.5 F (36.9 C), temperature source Oral, resp. rate 18, height 5\' 10"  (1.778 m), weight 143 lb 9.6 oz (65.137 kg), SpO2 100.00%.  General: Well developed, well appearing 57 y.o. female in no acute distress. HEENT: Normocephalic, atraumatic.  Oropharynx clear.  Neck: Supple without bruits. No JVD. Lungs: Respirations regular and unlabored, CTA bilaterally. No wheezes, rales or rhonchi. Heart: RRR.  No murmurs, rub, S3 or S4. Abdomen: Soft, non-tender, non-distended. BS present Extremities: No clubbing, cyanosis or edema.   Psych: Normal affect. Neuro: Alert and  oriented X 3. Moves all extremities spontaneously. Musculoskeletal: No kyphosis. Skin: Intact. Warm and dry. No rashes or petechiae in exposed areas.   Labs Lab Results  Component Value Date   WBC 11.4* 11/29/2012   HGB 12.1 11/29/2012   HCT 36.0 11/29/2012   MCV 80.7 11/29/2012   PLT 247 11/29/2012    Recent Labs Lab 11/29/12 1100  NA  142  K 3.8  CL 105  CO2 23  BUN 16  CREATININE 0.62  CALCIUM 9.3  PROT 7.0  BILITOT 0.2*  ALKPHOS 73  ALT 7  AST 15  GLUCOSE 91    Recent Labs  11/29/12 1100  INR 0.96    Radiology/Studies Dg Chest 2 View 11/29/2012   CLINICAL DATA:  Stroke, aortic valve replacement 2008, post stent grafting of thoracic aortic aneurysm  EXAM: CHEST  2 VIEW  COMPARISON:  Earlier exam of 11/29/2012  FINDINGS: Aortic stent graft extends from aortic arch to the diaphragm along a tortuous and aneurysmal thoracic aorta.  Scattered atherosclerotic calcifications aorta.  Enlargement of cardiac silhouette post median sternotomy.  Pulmonary vascularity normal.  Lungs grossly clear.  No pleural effusion or pneumothorax.  Surgical clips project over the upper anterior right chest.  Bones appear demineralized.  IMPRESSION: Post stent grafting of a thoracic aortic aneurysm.  Enlargement of cardiac silhouette post median sternotomy.  No definite acute abnormalities.   Electronically Signed   By: Ulyses Southward M.D.   On: 11/29/2012 21:47   Ct Head (brain) Wo Contrast 11/29/2012   CLINICAL DATA:  Facial droop, slurred speech  EXAM: CT HEAD WITHOUT CONTRAST  TECHNIQUE: Contiguous axial images were obtained from the base of the skull through the vertex without intravenous contrast.  COMPARISON:  CT 04/08/2007  FINDINGS: Ill-defined hypodensity right frontal operculum, most consistent with acute infarct. This was not present previously.  Ventricle size is normal. Mild chronic microvascular ischemic change in the white matter. Negative for hemorrhage.  Chronic fracture left medial orbit unchanged.  IMPRESSION: Right frontal operculum hypodensity, most likely acute infarct given the history. MRI suggested to confirm and exclude mass lesion.   Electronically Signed   By: Marlan Palau M.D.   On: 11/29/2012 10:41   [name] Head Wo Contrast 11/29/2012   CLINICAL DATA:  Stroke. Difficulty speaking and left facial droop.  EXAM: MRI  HEAD WITHOUT CONTRAST  MRA HEAD WITHOUT CONTRAST  TECHNIQUE: Multiplanar, multiecho pulse sequences of the brain and surrounding structures were obtained without intravenous contrast. Angiographic images of the head were obtained using MRA technique without contrast.  COMPARISON:  CT head without contrast 11/29/2012.  FINDINGS: MRI HEAD FINDINGS  The diffusion-weighted images confirm an acute infarct involving in the right insular cortex and frontal operculum. No hemorrhage or mass lesion is associated. T2 hyperintensities are associated with the area of acute infarction. No other acute infarcts are present. The ventricles are of normal size. A remote lacunar infarct is present in the left coronal radiata. Additional remote lacunar infarction noted within the right cerebellum and left pons. Mild periventricular white matter and hyperintensity is present bilaterally. And scattered punctate areas of susceptibility are present over the convexities bilaterally. Additional foci present in the cerebellum and occipital lobes. There is a punctate area of remote hemorrhage in the medial right temporal lobe. The most prominent area is in the inferior vermis. No significant extra-axial fluid collections are present.  Flow is present in the major intracranial arteries. The globes and orbits are intact. Minimal mucosal thickening is  noted in the left maxillary sinus. A remote medial left orbital blow-out fracture is evident. The paranasal sinuses and mastoid air cells are otherwise clear.  MRA HEAD FINDINGS  The internal carotid arteries and are within normal limits from the high cervical segments through the ICA termini bilaterally. Prominent posterior communicating arteries are present. The A1 and M1 segments are within normal limits. The left A1 segment is dominant. The anterior communicating artery is not clearly visualized. There is mild attenuation of more distal MCA branch vessels bilaterally.  Signal loss in the proximal  left vertebral artery and represents either extremely slow flow or proximal occlusion. The right PICA origin is visualized and normal. The vertebrobasilar junction is within normal limits. The basilar artery is small. The right posterior cerebral artery is of fetal type. The left posterior cerebral artery receive contribution both the left P1 segment and a posterior communicating artery. There is asymmetric attenuation of distal PCA branch vessels on the left.  IMPRESSION: MRI HEAD IMPRESSION  1. 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 2. Multiple remote lacunar infarcts involving the (radiata, left pons, and right cerebellum. 3. Multiple punctate areas of remote hemorrhage scattered throughout the brain. This raises concerning for amyloid angiopathy.  MRA HEAD IMPRESSION  1. 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 2. Mild distal small vessel disease as described.   Electronically Signed   By: Gennette Pac M.D.   On: 11/29/2012 12:33   Ct Angio Chest Aortic Dissect W &/or W/o 11/30/2012   CLINICAL DATA:  Recent history of abdominal aortic aneurysm leak and end stenting. Admitted with infarcts, rule out clots.  EXAM: CT ANGIOGRAPHY CHEST WITH CONTRAST  TECHNIQUE: Multidetector CT imaging of the chest was performed using the standard protocol during bolus administration of intravenous contrast. Multiplanar CT image reconstructions including MIPs were obtained to evaluate the vascular anatomy.  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 Generous sized thyroid gland.  MEDIASTINUM:  Status post revision of stent grafting of the thoracic aortic aneurysm, extending from the arch to the level of the aortic hiatus. There has been application of a 2nd layer of stents, and further coverage inferiorly.  Status post re-implantation of the great vessels (brachiocephalic and left common carotid arteries) onto the ascending aorta. This anastomosis appears unchanged. No evidence of dissection proximal to the re- implantation (the aortic root) to serve as a thrombotic source. There is minimal high density along the outer wall of the re-implanted trunk, which is likely atherosclerotic or postsurgical. No new narrowing of this vessel to suggest dissection. The left subclavian artery remains covered by graft, but it continues to fill in the arterial phase. This correlates with slow flow in the left vertebral on MRA 11/29/2012. No further in the leak is seen in the arterial phase. High-density material within the lower thoracic aneurysm sac was present previously, and is present prior to contrast administration.  No cardiomegaly or pericardial effusion. Diffuse coronary artery atherosclerosis.  No adenopathy.  LUNG WINDOWS:  Left-sided pleural changes compatible with postoperative scarring, mild. There is mild atelectasis in the lower lungs. No consolidation or overt edema.  UPPER ABDOMEN:  No acute findings.  OSSEOUS:  No acute fracture.  No suspicious lytic or blastic lesions.  Review of the MIP images confirms the above findings.  IMPRESSION: 1. No interval  change in the ascending aorta or proximal great vessels to explain recent right MCA territory infarct. 2. Status post repair of thoracic aortic stent grafting. No endoleak seen currently.   Electronically Signed   By: Tiburcio Pea M.D.   On: 11/30/2012 03:32    Echocardiogram  EF 60-65%, asymmetric septal hypertrophy, moderate LVH  12-lead ECG sinus rhythm Telemetry sinus rhythm with occasional PVC's, no atrial fibrillation identified   Assessment and Plan  Assessment and Plan:  1. Cryptogenic stroke The patient presents with cryptogenic stroke.  The patient has a TEE planned for this AM.  I spoke at length with the patient about monitoring for afib  with an implantable loop recorder.  Risks, benefits, and alteratives to implantable loop recorder were discussed with the patient today.   At this time, the patient is very clear in their decision to proceed with implantable loop recorder.   Please call with questions.

## 2012-12-01 NOTE — Progress Notes (Signed)
Stroke Team Progress Note  HISTORY Diana Walters is an 57 y.o. female with history of AO aneurysm tear S/p repair 2010 and further tear in 2014 with repair at [facility]. Patient was on ASA prior to repair but after surgery she states ASA interfered with her RA medications and she was taken off ASA. This AM 11/29/12 she awoke and noted she was drooling on the left side of her mouth and had dysarthria. Due to these symptoms she was brought to hospital. Initial CT head showed right frontal operculum hypodensity. 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 There  was also notation of Multiple punctate areas of remote hemorrhage scattered throughout the brain. This raises concerning for amyloid angiopathy. Currently she is NSR, stable and continues to show left facial droop and dysarthria. Patient was not a TPA candidate secondary to waking with symptoms, out of the window. She was admitted for further evaluation and treatment.  SUBJECTIVE Sister at the bedside. Endo on the way to pick her up.  OBJECTIVE Most recent Vital Signs: Filed Vitals:   12/01/12 0149 12/01/12 0432 12/01/12 0433 12/01/12 0928  BP: 163/57 148/79 159/75 168/65  Pulse: 65 53  55  Temp: 97.8 F (36.6 C) 98.5 F (36.9 C)  98.4 F (36.9 C)  TempSrc: Oral Oral  Oral  Resp: 18 18  18   Height:      Weight:      SpO2: 100% 100%  100%   CBG (last 3)   Recent Labs  11/29/12 1046  GLUCAP 98    IV Fluid Intake:     MEDICATIONS  . amLODipine  5 mg Oral Daily  . carvedilol  3.125 mg Oral BID WC  . clopidogrel  75 mg Oral Q breakfast  . enoxaparin (LOVENOX) injection  40 mg Subcutaneous Q24H  . levETIRAcetam  1,000 mg Oral BID  . losartan  50 mg Oral Daily  . [START ON 12/04/2012] methotrexate  10 mg Oral Q Mon  . pneumococcal 23 valent vaccine  0.5 mL Intramuscular Tomorrow-1000  .  predniSONE  5 mg Oral Q breakfast  . simvastatin  20 mg Oral QPM   PRN:    Diet:  NPO for TEE Activity:  as tolerated DVT Prophylaxis:  Lovenox 40 mg sq daily   CLINICALLY SIGNIFICANT STUDIES Basic Metabolic Panel:   Recent Labs Lab 11/29/12 1100  NA 142  K 3.8  CL 105  CO2 23  GLUCOSE 91  BUN 16  CREATININE 0.62  CALCIUM 9.3   Liver Function Tests:   Recent Labs Lab 11/29/12 1100  AST 15  ALT 7  ALKPHOS 73  BILITOT 0.2*  PROT 7.0  ALBUMIN 3.3*   CBC:   Recent Labs Lab 11/29/12 1100  WBC 11.4*  NEUTROABS 7.1  HGB 12.1  HCT 36.0  MCV 80.7  PLT 247   Coagulation:   Recent Labs Lab 11/29/12 1100  LABPROT 12.6  INR 0.96   Cardiac Enzymes:   Recent Labs Lab 11/29/12 1100  TROPONINI <0.30   Urinalysis: No results found for this basename: COLORURINE, APPERANCEUR, LABSPEC, PHURINE, GLUCOSEU, HGBUR, BILIRUBINUR, KETONESUR, PROTEINUR, UROBILINOGEN, NITRITE, LEUKOCYTESUR,  in the last 168 hours Lipid Panel    Component Value Date/Time   CHOL 184 11/30/2012 0553   TRIG 63 11/30/2012 0553   HDL 74 11/30/2012 0553   CHOLHDL 2.5 11/30/2012 0553   VLDL 13  11/30/2012 0553   LDLCALC 97 11/30/2012 0553   HgbA1C  Lab Results  Component Value Date   HGBA1C 5.8* 11/30/2012    Urine Drug Screen:     Component Value Date/Time   LABOPIA NONE DETECTED 04/08/2007 1402   COCAINSCRNUR NONE DETECTED 04/08/2007 1402   LABBENZ NONE DETECTED 04/08/2007 1402   AMPHETMU NONE DETECTED 04/08/2007 1402   THCU POSITIVE* 04/08/2007 1402   LABBARB  Value: NONE DETECTED        DRUG SCREEN FOR MEDICAL PURPOSES ONLY.  IF CONFIRMATION IS NEEDED FOR ANY PURPOSE, NOTIFY LAB WITHIN 5 DAYS. 04/08/2007 1402    Alcohol Level: No results found for this basename: ETH,  in the last 168 hours    Ct Angio Chest Aortic Dissect W &/or W/o  11/30/2012   1. No interval change in the ascending aorta or proximal great vessels to explain recent right MCA territory infarct. 2. Status post repair  of thoracic aortic stent grafting. No endoleak seen currently.    CT of the brain  11/29/2012  Right frontal operculum hypodensity, most likely acute infarct given the history.   MRI of the brain  11/28/2012  1. 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 2. Multiple remote lacunar infarcts involving the (radiata, left pons, and right cerebellum. 3. Multiple punctate areas of remote hemorrhage scattered throughout the brain. This raises concerning for amyloid angiopathy.  MRA of the brain  11/29/2012  1. 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 2. Mild distal small vessel disease  2D Echocardiogram  EF 60-65% with no source of embolus. Moderate LVH, somewhat asymmetric septal hypertrophy but no obvious LVOT gradient. LVEF 60-65%, grade 1 diastolic dysfunction. Upper normal left and right atrial size. Reported aortic stent graft not well seen as noted. No iobvious PFO or ASD. Cannot assess PASP. CVP estimated in normal range.  Carotid Doppler  There is 1-39% ICA stenosis. Right vertebral artery flow is antegrade. Left vertebral artery flow is retrograde. Minimal plaque noted.  CXR  11/29/2012    Post stent grafting of a thoracic aortic aneurysm.  Enlargement of cardiac silhouette post median sternotomy.  No definite acute abnormalities.  11/29/2012   No active cardiopulmonary disease.    EKG     Therapy Recommendations no OT, no PT needs  Physical Exam   Pleasant middle aged african american lady not in distress.Awake alert. Afebrile. Head is nontraumatic. Neck is supple without bruit. Hearing is normal. Cardiac exam no murmur or gallop. Lungs are clear to auscultation. Distal pulses are well felt. Neurological Exam :Awake alert oriented x 3 normal speech and language except mild dysarthria only. Mild left lower face asymmetry. Tongue midline. No drift. Mild diminished fine finger movements on left. Orbits right over left upper  extremity. Mild left grip weak.. Normal sensation . Normal coordination.  ASSESSMENT Ms. Diana Walters is a 57 y.o. female presenting with left facial weakness and dysarthria. Imaging confirms a right insular cortex infarct. Infarct felt to be embolic secondary to unknown source.  Patient also with old lacunar infarcts due to small vessel disease. 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 On no antithrombotics prior to admission. Now on clopidogrel 75 mg orally every day for secondary stroke prevention. Only mild left hemiparesis remains. Work up underway.  Hypertension Hyperlipidemia, LDL 97, on zocor 20 PTA, now on zocor 20, at goal LDL < 100 Rheumatoid Arthritis Hx AAA aneurysm w/ tear, repaired 2010 and 2014 Seizure  disorder on keppra ?L VA occlusion  Hospital day # 2  TREATMENT/PLAN  Continue clopidogrel 75 mg orally every day for secondary stroke prevention due to pt preference not to take asprin. TEE to look for embolic source. Arranged with [facility] for today.  If positive for PFO (patent foramen ovale), check bilateral lower extremity venous dopplers to rule out DVT as possible source of stroke. I made NPO after midnight. If TEE negative, [place] cardiologist will place implantable loop recorder to evaluate for atrial fibrillation as etiology of stroke. This has been explained to patient/family by Dr. Pearlean Brownie and they are agreeable. Ok for discharge post TEE No Therapy needs If TEE unrevealing, please schedule an outpatient TCD bubble study with emboli monitoring with Dr. Pearlean Brownie in 1 month to further evaluate for possible PFO. Have patient call for appointment. (I have added to discharge instruction sheet).   Annie Main, MSN, RN, ANVP-BC, ANP-BC, [facility] Pager: [phone] 12/01/2012 10:02 AM  I have personally obtained a history, examined the patient, evaluated imaging results, and formulated the assessment and plan of  care. I agree with the above.  Delia Heady, MD

## 2012-12-01 NOTE — Progress Notes (Signed)
Patient returned from loop recorder placement. Easily arousable and in stable condition.

## 2012-12-01 NOTE — Progress Notes (Signed)
  Echocardiogram Echocardiogram Transesophageal has been performed.  Diana Walters [name] 12/01/2012, 12:20 PM

## 2012-12-01 NOTE — Op Note (Signed)
SURGEON:  Hillis Range, MD     PREPROCEDURE DIAGNOSIS:  Cryptogenic Stroke    POSTPROCEDURE DIAGNOSIS:  Cryptogenic Stroke     PROCEDURES:   1. Implantable loop recorder implantation    INTRODUCTION:  Diana Walters is a 57 y.o. female with a history of unexplained stroke who presents today for implantable loop implantation.  The patient has had a cryptogenic stroke.  Despite an extensive workup by neurology, no reversible causes have been identified.  she has worn telemetry during which she did not have arrhythmias.  There is significant concern for possible atrial fibrillation as the cause for the patients stroke.  The patient therefore presents today for implantable loop implantation.     DESCRIPTION OF PROCEDURE:  Informed written consent was obtained, and the patient was brought to the electrophysiology lab in a fasting state.  The patient required no sedation for the procedure today.  Mapping over the patient's chest was performed by the EP lab staff to identify the area where electrograms were most prominent for ILR recording.  This area was found to be the left parasternal region over the 3rd-4th intercostal space. The patients left chest was therefore prepped and draped in the usual sterile fashion by the EP lab staff. The skin overlying the left parasternal region was infiltrated with lidocaine for local analgesia.  A 0.5-cm incision was made over the left parasternal region over the 3rd intercostal space.  A subcutaneous ILR pocket was fashioned using a combination of sharp and blunt dissection.  A Medtronic Reveal [place] model [id number] implantable loop recorder was then placed into the pocket  R waves were very prominent and measured 1.86 mV.  Steri- Strips and a sterile dressing were then applied.  There were no early apparent complications.     CONCLUSIONS:   1. Successful implantation of a Medtronic Reveal LINQ implantable loop recorder for cryptogenic stroke  2. No early  apparent complications.

## 2012-12-01 NOTE — Op Note (Signed)
PFO present as tested with injection of agitated saline. No LAA thrombus. Normal LV function. Aortic graft noted.

## 2012-12-01 NOTE — Interval H&P Note (Signed)
History and Physical Interval Note:  12/01/2012 11:14 AM  Diana Walters  has presented today for surgery, with the diagnosis of stroke  The various methods of treatment have been discussed with the patient and family. After consideration of risks, benefits and other options for treatment, the patient has consented to  Procedure(s): TRANSESOPHAGEAL ECHOCARDIOGRAM (TEE) (N/A) as a surgical intervention .  The patient's history has been reviewed, patient examined, no change in status, stable for surgery.  I have reviewed the patient's chart and labs.  Questions were answered to the patient's satisfaction.     Dietrich Pates

## 2012-12-01 NOTE — Progress Notes (Signed)
Patient is being discharged from room 4N10 at this time. Alert and in stable condition. Dsg to loop placement site on left upper chest intact and dry. Discharge instructions read to patient and daughter and verbalized understanding. IV d/c'd and also tele.Left unit via wheelchair with all belongings.

## 2012-12-01 NOTE — Progress Notes (Signed)
Bilateral lower extremity venous duplex:  No evidence of DVT, superficial thrombosis, or Baker's Cyst.   

## 2012-12-01 NOTE — Progress Notes (Signed)
SLP Cancellation Note  Patient Details Name: NAYZETH ALTMAN MRN: [id number] DOB: 1955/05/05   Cancelled treatment:        Unable to complete SLE at this time. Pt is off unit for test/procedure.  Will continue efforts.  Celia B. Murvin Natal [facility], CCC-SLP [phone]  Andersen, Mckiver 12/01/2012, 12:51 PM

## 2012-12-01 NOTE — Consult Note (Addendum)
ELECTROPHYSIOLOGY CONSULT NOTE  Patient ID: Diana Walters MRN: [id number], DOB/AGE: [birth year]   Admit date: 11/29/2012 Date of Consult: 12/01/2012  Primary Physician: Romero Belling, MD Primary Cardiologist: new to [facility] Reason for Consultation: Cryptogenic stroke; recommendations regarding Implantable Loop Recorder  History of Present Illness Diana Walters was admitted on 11/29/2012 with left facial weakness and dysarthria.  She was found to have an acute CVA. she has been monitored on telemetry which has demonstrated no arrhythmias. No cause has been identified. Inpatient stroke work-up is to be completed with a TEE. EP has been asked to evaluate for placement of an implantable loop recorder to monitor for atrial fibrillation.  She report intermittent palpitations for several years. These are self terminating and cause minimal symptoms.  She has never had syncope or pre-syncope.   Past Medical History Past Medical History  Diagnosis Date  . HYPERCHOLESTEROLEMIA 04/01/2008  . ASTHMA 10/28/2006  . ANXIETY 10/28/2006  . ANEMIA 10/17/2009  . HYPERTENSION 10/28/2006  . 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 SEIZURE DISORDER 04/01/2008  . Menopause     age 1  . Duodenitis without mention of hemorrhage 2005    Past Surgical History Past Surgical History  Procedure Laterality Date  . Esophagogastroduodenoscopy  01/29/2004    Allergies/Intolerances No Known Allergies Inpatient Medications . amLODipine  5 mg Oral Daily  . carvedilol  3.125 mg Oral BID WC  . clopidogrel  75 mg Oral Q breakfast  . enoxaparin (LOVENOX) injection  40 mg Subcutaneous Q24H  . levETIRAcetam  1,000 mg Oral BID  . losartan  50 mg Oral Daily  . [START ON 12/04/2012] methotrexate  10 mg Oral Q Mon  . pneumococcal 23 valent vaccine  0.5 mL Intramuscular Tomorrow-1000  . predniSONE  5 mg Oral Q breakfast  . simvastatin  20 mg Oral QPM     Social History History   Social History  . Marital  Status: Single    Spouse Name: N/A    Number of Children: N/A  . Years of Education: N/A   Occupational History  . Not on file.   Social History Main Topics  . Smoking status: Former Smoker    Types: Cigarettes  . Smokeless tobacco: Never Used  . Alcohol Use: No  . Drug Use: No  . Sexual Activity: Not on file   Other Topics Concern  . Not on file   Social History Narrative  . No narrative on file    Review of Systems General: No chills, fever, night sweats or weight changes  Cardiovascular:  No chest pain, dyspnea on exertion, edema, orthopnea, paroxysmal nocturnal dyspnea Dermatological: No rash, lesions or masses Respiratory: No cough, dyspnea Urologic: No hematuria, dysuria Abdominal: No nausea, vomiting, diarrhea, bright red blood per rectum, melena, or hematemesis Neurologic: No visual changes, weakness, changes in mental status All other systems reviewed and are otherwise negative except as noted above.  Physical Exam Blood pressure 159/75, pulse 53, temperature 98.5 F (36.9 C), temperature source Oral, resp. rate 18, height 5\' 10"  (1.778 m), weight 143 lb 9.6 oz (65.137 kg), SpO2 100.00%.  General: Well developed, well appearing 57 y.o. female in no acute distress. HEENT: Normocephalic, atraumatic.  Oropharynx clear.  Neck: Supple without bruits. No JVD. Lungs: Respirations regular and unlabored, CTA bilaterally. No wheezes, rales or rhonchi. Heart: RRR.  No murmurs, rub, S3 or S4. Abdomen: Soft, non-tender, non-distended. BS present Extremities: No clubbing, cyanosis or edema.   Psych: Normal affect. Neuro: Alert and  oriented X 3. Moves all extremities spontaneously. Musculoskeletal: No kyphosis. Skin: Intact. Warm and dry. No rashes or petechiae in exposed areas.   Labs Lab Results  Component Value Date   WBC 11.4* 11/29/2012   HGB 12.1 11/29/2012   HCT 36.0 11/29/2012   MCV 80.7 11/29/2012   PLT 247 11/29/2012    Recent Labs Lab 11/29/12 1100  NA  142  K 3.8  CL 105  CO2 23  BUN 16  CREATININE 0.62  CALCIUM 9.3  PROT 7.0  BILITOT 0.2*  ALKPHOS 73  ALT 7  AST 15  GLUCOSE 91    Recent Labs  11/29/12 1100  INR 0.96    Radiology/Studies Dg Chest 2 View 11/29/2012   CLINICAL DATA:  Stroke, aortic valve replacement 2008, post stent grafting of thoracic aortic aneurysm  EXAM: CHEST  2 VIEW  COMPARISON:  Earlier exam of 11/29/2012  FINDINGS: Aortic stent graft extends from aortic arch to the diaphragm along a tortuous and aneurysmal thoracic aorta.  Scattered atherosclerotic calcifications aorta.  Enlargement of cardiac silhouette post median sternotomy.  Pulmonary vascularity normal.  Lungs grossly clear.  No pleural effusion or pneumothorax.  Surgical clips project over the upper anterior right chest.  Bones appear demineralized.  IMPRESSION: Post stent grafting of a thoracic aortic aneurysm.  Enlargement of cardiac silhouette post median sternotomy.  No definite acute abnormalities.   Electronically Signed   By: Ulyses Southward M.D.   On: 11/29/2012 21:47   Ct Walters (brain) Wo Contrast 11/29/2012   CLINICAL DATA:  Facial droop, slurred speech  EXAM: CT Walters WITHOUT CONTRAST  TECHNIQUE: Contiguous axial images were obtained from the base of the skull through the vertex without intravenous contrast.  COMPARISON:  CT 04/08/2007  FINDINGS: Ill-defined hypodensity right frontal operculum, most consistent with acute infarct. This was not present previously.  Ventricle size is normal. Mild chronic microvascular ischemic change in the white matter. Negative for hemorrhage.  Chronic fracture left medial orbit unchanged.  IMPRESSION: Right frontal operculum hypodensity, most likely acute infarct given the history. MRI suggested to confirm and exclude mass lesion.   Electronically Signed   By: Marlan Palau M.D.   On: 11/29/2012 10:41   Mr Diana Walters Wo Contrast 11/29/2012   CLINICAL DATA:  Stroke. Difficulty speaking and left facial droop.  EXAM: MRI  Walters WITHOUT CONTRAST  MRA Walters WITHOUT CONTRAST  TECHNIQUE: Multiplanar, multiecho pulse sequences of the brain and surrounding structures were obtained without intravenous contrast. Angiographic images of the Walters were obtained using MRA technique without contrast.  COMPARISON:  CT Walters without contrast 11/29/2012.  FINDINGS: MRI Walters FINDINGS  The diffusion-weighted images confirm an acute infarct involving in the right insular cortex and frontal operculum. No hemorrhage or mass lesion is associated. T2 hyperintensities are associated with the area of acute infarction. No other acute infarcts are present. The ventricles are of normal size. A remote lacunar infarct is present in the left coronal radiata. Additional remote lacunar infarction noted within the right cerebellum and left pons. Mild periventricular white matter and hyperintensity is present bilaterally. And scattered punctate areas of susceptibility are present over the convexities bilaterally. Additional foci present in the cerebellum and occipital lobes. There is a punctate area of remote hemorrhage in the medial right temporal lobe. The most prominent area is in the inferior vermis. No significant extra-axial fluid collections are present.  Flow is present in the major intracranial arteries. The globes and orbits are intact. Minimal mucosal thickening is  noted in the left maxillary sinus. A remote medial left orbital blow-out fracture is evident. The paranasal sinuses and mastoid air cells are otherwise clear.  MRA Walters FINDINGS  The internal carotid arteries and are within normal limits from the high cervical segments through the ICA termini bilaterally. Prominent posterior communicating arteries are present. The A1 and M1 segments are within normal limits. The left A1 segment is dominant. The anterior communicating artery is not clearly visualized. There is mild attenuation of more distal MCA branch vessels bilaterally.  Signal loss in the proximal  left vertebral artery and represents either extremely slow flow or proximal occlusion. The right PICA origin is visualized and normal. The vertebrobasilar junction is within normal limits. The basilar artery is small. The right posterior cerebral artery is of fetal type. The left posterior cerebral artery receive contribution both the left P1 segment and a posterior communicating artery. There is asymmetric attenuation of distal PCA branch vessels on the left.  IMPRESSION: MRI Walters IMPRESSION  1. 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 2. Multiple remote lacunar infarcts involving the (radiata, left pons, and right cerebellum. 3. Multiple punctate areas of remote hemorrhage scattered throughout the brain. This raises concerning for amyloid angiopathy.  MRA Walters IMPRESSION  1. 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 2. Mild distal small vessel disease as described.   Electronically Signed   By: Gennette Pac M.D.   On: 11/29/2012 12:33   Ct Angio Chest Aortic Dissect W &/or W/o 11/30/2012   CLINICAL DATA:  Recent history of abdominal aortic aneurysm leak and end stenting. Admitted with infarcts, rule out clots.  EXAM: CT ANGIOGRAPHY CHEST WITH CONTRAST  TECHNIQUE: Multidetector CT imaging of the chest was performed using the standard protocol during bolus administration of intravenous contrast. Multiplanar CT image reconstructions including MIPs were obtained to evaluate the vascular anatomy.  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 Generous sized thyroid gland.  MEDIASTINUM:  Status post revision of stent grafting of the thoracic aortic aneurysm, extending from the arch to the level of the aortic hiatus. There has been application of a 2nd layer of stents, and further coverage inferiorly.  Status post re-implantation of the great vessels (brachiocephalic and left common carotid arteries) onto the ascending aorta. This anastomosis appears unchanged. No evidence of dissection proximal to the re- implantation (the aortic root) to serve as a thrombotic source. There is minimal high density along the outer wall of the re-implanted trunk, which is likely atherosclerotic or postsurgical. No new narrowing of this vessel to suggest dissection. The left subclavian artery remains covered by graft, but it continues to fill in the arterial phase. This correlates with slow flow in the left vertebral on MRA 11/29/2012. No further in the leak is seen in the arterial phase. High-density material within the lower thoracic aneurysm sac was present previously, and is present prior to contrast administration.  No cardiomegaly or pericardial effusion. Diffuse coronary artery atherosclerosis.  No adenopathy.  LUNG WINDOWS:  Left-sided pleural changes compatible with postoperative scarring, mild. There is mild atelectasis in the lower lungs. No consolidation or overt edema.  UPPER ABDOMEN:  No acute findings.  OSSEOUS:  No acute fracture.  No suspicious lytic or blastic lesions.  Review of the MIP images confirms the above findings.  IMPRESSION: 1. No interval  change in the ascending aorta or proximal great vessels to explain recent right MCA territory infarct. 2. Status post repair of thoracic aortic stent grafting. No endoleak seen currently.   Electronically Signed   By: Tiburcio Pea M.D.   On: 11/30/2012 03:32    Echocardiogram  EF 60-65%, asymmetric septal hypertrophy, moderate LVH  12-lead ECG sinus rhythm Telemetry sinus rhythm with occasional PVC's, no atrial fibrillation identified   Assessment and Plan  Assessment and Plan:  1. Cryptogenic stroke The patient presents with cryptogenic stroke.  The patient has a TEE planned for this AM.  I spoke at length with the patient about monitoring for afib  with an implantable loop recorder.  Risks, benefits, and alteratives to implantable loop recorder were discussed with the patient today.   At this time, the patient is very clear in their decision to proceed with implantable loop recorder.   Please call with questions.    Addendum: TEE reveals PFO.  Dopplers reveal no DVT Aortic arch findings discussed with Dr Pearlean Brownie.  We agree that it is prudent to proceed with ILR implant at this time.

## 2012-12-04 ENCOUNTER — Other Ambulatory Visit: Payer: Self-pay | Admitting: Neurology

## 2012-12-04 ENCOUNTER — Encounter (HOSPITAL_COMMUNITY): Payer: Self-pay | Admitting: Internal Medicine

## 2012-12-04 DIAGNOSIS — I6350 Cerebral infarction due to unspecified occlusion or stenosis of unspecified cerebral artery: Secondary | ICD-10-CM

## 2012-12-15 ENCOUNTER — Ambulatory Visit (INDEPENDENT_AMBULATORY_CARE_PROVIDER_SITE_OTHER): Payer: 59 | Admitting: *Deleted

## 2012-12-15 ENCOUNTER — Encounter (INDEPENDENT_AMBULATORY_CARE_PROVIDER_SITE_OTHER): Payer: 59 | Admitting: Physician Assistant

## 2012-12-15 DIAGNOSIS — I6350 Cerebral infarction due to unspecified occlusion or stenosis of unspecified cerebral artery: Secondary | ICD-10-CM

## 2012-12-15 DIAGNOSIS — I639 Cerebral infarction, unspecified: Secondary | ICD-10-CM

## 2012-12-15 LAB — PACEMAKER DEVICE OBSERVATION

## 2012-12-15 NOTE — Progress Notes (Signed)
Pt seen in device clinic for follow up of recently implanted loop recorder.  Wound well healed.  No redness, swelling, or edema.  Steri-strips removed prior to arrival.   Device interrogated and found to be functioning normally.  No changes made today. See PaceArt for full details.  Pt denies chest pain, shortness of breath, palpitations, or dizziness.  Pt to follow up PRN w/ Dr. Allred---cryptogenic stroke.   Eulah Walkup 12/15/2012 8:58 AM

## 2012-12-19 ENCOUNTER — Ambulatory Visit: Payer: Self-pay | Admitting: Neurology

## 2012-12-24 ENCOUNTER — Encounter (HOSPITAL_COMMUNITY): Payer: Self-pay | Admitting: *Deleted

## 2012-12-29 ENCOUNTER — Encounter (INDEPENDENT_AMBULATORY_CARE_PROVIDER_SITE_OTHER): Payer: Self-pay

## 2012-12-29 ENCOUNTER — Encounter: Payer: Self-pay | Admitting: Internal Medicine

## 2012-12-29 ENCOUNTER — Encounter: Payer: Self-pay | Admitting: Neurology

## 2012-12-29 ENCOUNTER — Ambulatory Visit (INDEPENDENT_AMBULATORY_CARE_PROVIDER_SITE_OTHER): Payer: 59

## 2012-12-29 ENCOUNTER — Ambulatory Visit (INDEPENDENT_AMBULATORY_CARE_PROVIDER_SITE_OTHER): Payer: Self-pay

## 2012-12-29 ENCOUNTER — Ambulatory Visit (INDEPENDENT_AMBULATORY_CARE_PROVIDER_SITE_OTHER): Payer: 59 | Admitting: Neurology

## 2012-12-29 VITALS — BP 209/91 | HR 91

## 2012-12-29 DIAGNOSIS — I6350 Cerebral infarction due to unspecified occlusion or stenosis of unspecified cerebral artery: Secondary | ICD-10-CM

## 2012-12-29 DIAGNOSIS — Z0289 Encounter for other administrative examinations: Secondary | ICD-10-CM

## 2012-12-29 NOTE — Progress Notes (Signed)
[facility] [address] [place]. [place] [address] [phone]       OFFICE FOLLOW-UP NOTE  Ms. Diana Walters Date of Birth:  06/10/1955 Medical Record Number:  [id number]   HPI: 57 year African American lady seen for first office followup visit following a recent right brain stroke in October 2014. She was admitted on 11/30/38 and when she woke up for drooling of the left side of her mouth and dysarthria. CT scan of the head showed right frontal opercular hypodensity an MRI scan confirmed acute nonhemorrhagic infarct involving the right insular cortex and operculum as well as multiple remote lacunar infarcts involving left pons and right cerebellum. There also multiple punctate areas of microhemorrhages scattered throughout the brain due to small vessel disease. Transthoracic echo showed normal ejection fraction. TEE showed a small PFO but no clot. Total cholesterol was 184, triglycerides 60, HDL 74 and LDL 97 mg percent. Hemoglobin and 1C was 5.8%. Urine drug screen was positive for marijuana. CT angiogram of the chest showed old para thoracic aorta with stent and graft but no endovascular leak. Carotid Doppler showed 1-39% bilateral ICA stenosis. Left vertebral artery flow is retrograde. She had remote history of seizures which was stable on Keppra. She is advised to continue Plavix because of her peripheral vascular disease and maintain strict control of hypertension and hyperlipidemia. She states she'll do well since discharge and has not had endocrine stroke symptoms. She states her blood pressure is under good control though she forgot to take her medication this morning and her blood pressure was significantly elevated in office today at 210/93.  ROS:   14 system review of systems is positive for fatigue, seizure, joint pain and swelling, murmur. PMH:  Past Medical History  Diagnosis Date  . HYPERCHOLESTEROLEMIA 04/01/2008  . ASTHMA 10/28/2006  . ANXIETY 10/28/2006  .  ANEMIA 10/17/2009  . HYPERTENSION 10/28/2006  . 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 SEIZURE DISORDER 04/01/2008  . Menopause     age 57  . Duodenitis without mention of hemorrhage 2005  . Stroke 11/2012    Social History:  History   Social History  . Marital Status: Single    Spouse Name: N/A    Number of Children: N/A  . Years of Education: N/A   Occupational History  . Not on file.   Social History Main Topics  . Smoking status: Former Smoker    Types: Cigarettes  . Smokeless tobacco: Never Used  . Alcohol Use: No  . Drug Use: No  . Sexual Activity: Not on file   Other Topics Concern  . Not on file   Social History Narrative  . No narrative on file    Medications:   Current Outpatient Prescriptions on File Prior to Visit  Medication Sig Dispense Refill  . amLODipine (NORVASC) 5 MG tablet Take 5 mg by mouth daily.      . carvedilol (COREG) 3.125 MG tablet Take 3.125 mg by mouth 2 (two) times daily with a meal.      . clopidogrel (PLAVIX) 75 MG tablet Take 1 tablet (75 mg total) by mouth daily with breakfast.  30 tablet  0  . HYDROcodone-acetaminophen (NORCO) 10-325 MG per tablet Take 1 tablet by mouth every 6 (six) hours as needed for pain.  50 tablet  1  . InFLIXimab (REMICADE IV) Inject into the vein. Due the 28th of October    At rheumatology      . levETIRAcetam (KEPPRA) 1000 MG tablet  Take 1,000 mg by mouth 2 (two) times daily.      Marland Kitchen losartan (COZAAR) 50 MG tablet Take 1 tablet (50 mg total) by mouth daily.  30 tablet  2  . methotrexate 2.5 MG tablet Take 10 mg by mouth once a week.       . Multiple Vitamin (MULTIVITAMIN) tablet Take 1 tablet by mouth daily.        . predniSONE (DELTASONE) 5 MG tablet Take 5 mg by mouth daily.       . simvastatin (ZOCOR) 20 MG tablet Take 20 mg by mouth every evening.       No current facility-administered medications on file prior to visit.    Allergies:  No Known Allergies  Physical Exam General: Frail middle-aged  African American lady, seated, in no evident distress Head: head normocephalic and atraumatic. Orohparynx benign Neck: supple with no carotid or supraclavicular bruits Cardiovascular: regular rate and rhythm, no murmurs Musculoskeletal: no deformity Skin:  no rash/petichiae Vascular:  Normal pulses all extremities Filed Vitals:   12/29/12 1330  BP: 209/91  Pulse: 91    Neurologic Exam Mental Status: Awake and fully alert. Oriented to place and time. Recent and remote memory intact. Attention span, concentration and fund of knowledge appropriate. Mood and affect appropriate.  Cranial Nerves: Fundoscopic exam reveals sharp disc margins. Pupils equal, briskly reactive to light. Extraocular movements full without nystagmus. Visual fields full to confrontation. Hearing intact. Facial sensation intact. Face, tongue, palate moves normally and symmetrically.  Motor: Normal bulk and tone. Normal strength in all tested extremity muscles.-Findings are movements on the left. Orbits right over left upper extremity. Mild weakness of left grip only Sensory.: intact to tough and pinprick and vibratory.  Coordination: Rapid alternating movements normal in all extremities. Finger-to-nose and heel-to-shin performed accurately bilaterally. Gait and Station: Arises from chair without difficulty. Stance is normal. Gait demonstrates normal stride length and balance . Able to heel, toe and tandem walk without difficulty.  Reflexes: 1+ and symmetric. Toes downgoing.   NIHSS  0 Modified Rankin  1  ASSESSMENT: 57 year lady with right frontal MCA branch infarct in October 2014 of embolic etiology without identified source-likely cryptogenic. Vascular risk factors of HT, Hyperlipidimia, smoking and marijuana use.   PLAN: I had a long discussion with the patient with regards to stroke, discussed results of the tests, risk factor modification and answered questions. Continue Plavix for stroke prevention and strict  control of lipids with LDL cholesterol goal below 100 mg percent. Exercise regularly, diet and maintain optimal body weight. She was counseled to quit smoking and using marijuana Consider participation in [employer] PFO closure trial if interested. Check TCD bubble study to categorize her PFO.      [facility]      [address]      [place]. [place] [address] [phone]       TRANSCRANIAL DOPPLER BUBBLE STUDY   Ms. Diana Walters Date of Birth:  1955-05-02 Medical Record Number:  [id number]   Indications: Diagnostic Date of Procedure: 12/29/12 Clinical History:  72 year lady with stroke Technical Description:   Transcranial Doppler Bubble Study was performed at the bedside after taking written informed consent from the patient and explaining risk/benefits. Both middle cerebral arteries were insonated using a headset. And IV line was inserted in the left forearm by the RN using aseptic precautions. Agitated saline injection at rest and after valsalva maneuver did result in several high intensity transient signals (HITS).  Impression:  Positive  Transcranial Doppler Bubble Study indicative of  A small right to left intracardiac shunt.   Results were explained to the patient. Questions were answered.I had a long discussion with the patient with regards to the role of patent foramen ovale   and risk of stroke. It is unclear at the present time whether PFO closure leads to better secondary stroke prevention or not. There are ongoing clinical trials which are trying to address this issue. I would recommend antiplatelet therapy for now and consider possible particpation in the ongoing [employer] PFO Closure trial ..

## 2012-12-29 NOTE — Patient Instructions (Signed)
Continue Plavix for stroke prevention and strict control of lipids with LDL cholesterol goal below 100 mg percent. Exercise regularly, diet and maintain optimal body weight. Consider participation in [employer] PFO closure trial if interested.

## 2013-01-01 ENCOUNTER — Other Ambulatory Visit: Payer: Self-pay | Admitting: *Deleted

## 2013-01-01 MED ORDER — CLOPIDOGREL BISULFATE 75 MG PO TABS: 75.0000 mg | ORAL_TABLET | Freq: Every day | ORAL | Status: DC

## 2013-01-02 ENCOUNTER — Telehealth: Payer: Self-pay | Admitting: *Deleted

## 2013-01-09 ENCOUNTER — Ambulatory Visit: Payer: Self-pay | Admitting: Nurse Practitioner

## 2013-02-01 ENCOUNTER — Ambulatory Visit (INDEPENDENT_AMBULATORY_CARE_PROVIDER_SITE_OTHER): Payer: 59 | Admitting: *Deleted

## 2013-02-01 DIAGNOSIS — I639 Cerebral infarction, unspecified: Secondary | ICD-10-CM

## 2013-02-01 DIAGNOSIS — I6350 Cerebral infarction due to unspecified occlusion or stenosis of unspecified cerebral artery: Secondary | ICD-10-CM

## 2013-03-05 ENCOUNTER — Ambulatory Visit (INDEPENDENT_AMBULATORY_CARE_PROVIDER_SITE_OTHER): Payer: 59 | Admitting: *Deleted

## 2013-03-05 DIAGNOSIS — I6350 Cerebral infarction due to unspecified occlusion or stenosis of unspecified cerebral artery: Secondary | ICD-10-CM

## 2013-03-05 DIAGNOSIS — I639 Cerebral infarction, unspecified: Secondary | ICD-10-CM

## 2013-03-21 ENCOUNTER — Other Ambulatory Visit: Payer: Self-pay | Admitting: *Deleted

## 2013-03-21 LAB — MDC_IDC_ENUM_SESS_TYPE_REMOTE

## 2013-03-21 MED ORDER — LEVETIRACETAM 1000 MG PO TABS: 1000.0000 mg | ORAL_TABLET | Freq: Two times a day (BID) | ORAL | Status: DC

## 2013-04-04 ENCOUNTER — Ambulatory Visit (INDEPENDENT_AMBULATORY_CARE_PROVIDER_SITE_OTHER): Payer: 59 | Admitting: *Deleted

## 2013-04-04 DIAGNOSIS — I6350 Cerebral infarction due to unspecified occlusion or stenosis of unspecified cerebral artery: Secondary | ICD-10-CM

## 2013-04-04 DIAGNOSIS — I639 Cerebral infarction, unspecified: Secondary | ICD-10-CM

## 2013-04-06 ENCOUNTER — Encounter: Payer: Self-pay | Admitting: Internal Medicine

## 2013-04-27 ENCOUNTER — Other Ambulatory Visit: Payer: Self-pay

## 2013-04-27 MED ORDER — CLOPIDOGREL BISULFATE 75 MG PO TABS: 75.0000 mg | ORAL_TABLET | Freq: Every day | ORAL | Status: DC

## 2013-05-01 ENCOUNTER — Other Ambulatory Visit: Payer: Self-pay | Admitting: *Deleted

## 2013-05-01 MED ORDER — LOSARTAN POTASSIUM 50 MG PO TABS: 50.0000 mg | ORAL_TABLET | Freq: Every day | ORAL | Status: DC

## 2013-05-04 ENCOUNTER — Ambulatory Visit (INDEPENDENT_AMBULATORY_CARE_PROVIDER_SITE_OTHER): Payer: 59 | Admitting: *Deleted

## 2013-05-04 DIAGNOSIS — I639 Cerebral infarction, unspecified: Secondary | ICD-10-CM

## 2013-05-04 DIAGNOSIS — I6350 Cerebral infarction due to unspecified occlusion or stenosis of unspecified cerebral artery: Secondary | ICD-10-CM

## 2013-05-04 LAB — MDC_IDC_ENUM_SESS_TYPE_REMOTE

## 2013-06-05 ENCOUNTER — Ambulatory Visit (INDEPENDENT_AMBULATORY_CARE_PROVIDER_SITE_OTHER): Payer: 59 | Admitting: *Deleted

## 2013-06-05 DIAGNOSIS — I6350 Cerebral infarction due to unspecified occlusion or stenosis of unspecified cerebral artery: Secondary | ICD-10-CM

## 2013-06-05 DIAGNOSIS — I639 Cerebral infarction, unspecified: Secondary | ICD-10-CM

## 2013-06-05 LAB — MDC_IDC_ENUM_SESS_TYPE_REMOTE

## 2013-06-06 ENCOUNTER — Encounter: Payer: Self-pay | Admitting: Internal Medicine

## 2013-06-28 ENCOUNTER — Other Ambulatory Visit: Payer: Self-pay | Admitting: *Deleted

## 2013-06-28 NOTE — Telephone Encounter (Signed)
This was mistakenly refilled under my name 3 mos ago.  i do not prescribe this, as it is outside the scope of my practice.  It is probably rx'ed by neurology.

## 2013-07-03 ENCOUNTER — Other Ambulatory Visit: Payer: Self-pay | Admitting: Endocrinology

## 2013-07-05 ENCOUNTER — Other Ambulatory Visit: Payer: Self-pay

## 2013-07-05 MED ORDER — CLOPIDOGREL BISULFATE 75 MG PO TABS: 75.0000 mg | ORAL_TABLET | Freq: Every day | ORAL | Status: DC

## 2013-07-06 ENCOUNTER — Encounter: Payer: Self-pay | Admitting: Endocrinology

## 2013-07-06 ENCOUNTER — Ambulatory Visit (INDEPENDENT_AMBULATORY_CARE_PROVIDER_SITE_OTHER): Payer: 59 | Admitting: Endocrinology

## 2013-07-06 ENCOUNTER — Ambulatory Visit (INDEPENDENT_AMBULATORY_CARE_PROVIDER_SITE_OTHER): Payer: 59

## 2013-07-06 VITALS — BP 132/76 | HR 75 | Temp 98.5°F | Ht 70.0 in | Wt 166.0 lb

## 2013-07-06 DIAGNOSIS — E040 Nontoxic diffuse goiter: Secondary | ICD-10-CM | POA: Insufficient documentation

## 2013-07-06 DIAGNOSIS — I6350 Cerebral infarction due to unspecified occlusion or stenosis of unspecified cerebral artery: Secondary | ICD-10-CM

## 2013-07-06 DIAGNOSIS — I10 Essential (primary) hypertension: Secondary | ICD-10-CM

## 2013-07-06 DIAGNOSIS — Z0000 Encounter for general adult medical examination without abnormal findings: Secondary | ICD-10-CM

## 2013-07-06 DIAGNOSIS — E7800 Pure hypercholesterolemia, unspecified: Secondary | ICD-10-CM

## 2013-07-06 DIAGNOSIS — I639 Cerebral infarction, unspecified: Secondary | ICD-10-CM

## 2013-07-06 LAB — CBC WITH DIFFERENTIAL/PLATELET
BASOS PCT: 0.6 % (ref 0.0–3.0)
Basophils Absolute: 0 10*3/uL (ref 0.0–0.1)
EOS PCT: 1.5 % (ref 0.0–5.0)
Eosinophils Absolute: 0.1 10*3/uL (ref 0.0–0.7)
HCT: 37.9 % (ref 36.0–46.0)
Hemoglobin: 12.4 g/dL (ref 12.0–15.0)
Lymphocytes Relative: 30.5 % (ref 12.0–46.0)
Lymphs Abs: 2.7 10*3/uL (ref 0.7–4.0)
MCHC: 32.7 g/dL (ref 30.0–36.0)
MCV: 82.8 fl (ref 78.0–100.0)
MONOS PCT: 7.2 % (ref 3.0–12.0)
Monocytes Absolute: 0.6 10*3/uL (ref 0.1–1.0)
NEUTROS PCT: 60.2 % (ref 43.0–77.0)
Neutro Abs: 5.3 10*3/uL (ref 1.4–7.7)
PLATELETS: 335 10*3/uL (ref 150.0–400.0)
RBC: 4.58 Mil/uL (ref 3.87–5.11)
RDW: 18.3 % — ABNORMAL HIGH (ref 11.5–15.5)
WBC: 8.9 10*3/uL (ref 4.0–10.5)

## 2013-07-06 LAB — IBC PANEL
IRON: 55 ug/dL (ref 42–145)
Saturation Ratios: 19.9 % — ABNORMAL LOW (ref 20.0–50.0)
TRANSFERRIN: 197.5 mg/dL — AB (ref 212.0–360.0)

## 2013-07-06 LAB — URINALYSIS, ROUTINE W REFLEX MICROSCOPIC
Bilirubin Urine: NEGATIVE
Ketones, ur: NEGATIVE
Leukocytes, UA: NEGATIVE
NITRITE: NEGATIVE
Specific Gravity, Urine: 1.025 (ref 1.000–1.030)
Total Protein, Urine: 30 — AB
URINE GLUCOSE: NEGATIVE
UROBILINOGEN UA: 0.2 (ref 0.0–1.0)
pH: 5.5 (ref 5.0–8.0)

## 2013-07-06 LAB — TSH: TSH: 0.35 u[IU]/mL (ref 0.35–4.50)

## 2013-07-06 NOTE — Progress Notes (Signed)
Subjective:    Patient ID: Diana Walters, female    DOB: [birth year], 58 y.o.   MRN: [id number]  HPI Pt is here for regular wellness examination, and is feeling pretty well in general, and says chronic med probs are stable, except as noted below Past Medical History  Diagnosis Date  . HYPERCHOLESTEROLEMIA 04/01/2008  . ASTHMA 10/28/2006  . ANXIETY 10/28/2006  . ANEMIA 10/17/2009  . HYPERTENSION 10/28/2006  . 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 SEIZURE DISORDER 04/01/2008  . Menopause     age 89  . Duodenitis without mention of hemorrhage 2005  . Stroke 11/2012    Past Surgical History  Procedure Laterality Date  . Esophagogastroduodenoscopy  01/29/2004  . Tee without cardioversion N/A 12/01/2012    Procedure: TRANSESOPHAGEAL ECHOCARDIOGRAM (TEE);  Surgeon: Fay Records, MD;  Location: [facility] ENDOSCOPY;  Service: Cardiovascular;  Laterality: N/A;  . 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 Marital Status: Single    Spouse Name: N/A    Number of Children: N/A  . Years of Education: N/A   Occupational History  . Not on file.   Social History Main Topics  . Smoking status: Former Smoker    Types: Cigarettes  . Smokeless tobacco: Never Used  . Alcohol Use: No  . Drug Use: No  . Sexual Activity: Not on file   Other Topics Concern  . Not on file   Social History Narrative  . No narrative on file    Current Outpatient Prescriptions on File Prior to Visit  Medication Sig Dispense Refill  . amLODipine (NORVASC) 5 MG tablet Take 5 mg by mouth daily.      . carvedilol (COREG) 3.125 MG tablet Take 3.125 mg by mouth 2 (two) times daily with a meal.      . clopidogrel (PLAVIX) 75 MG tablet Take 1 tablet (75 mg total) by mouth daily with breakfast.  30 tablet  1  . InFLIXimab (REMICADE IV) Inject into the vein. Due the 28th of October    At rheumatology      . levETIRAcetam (KEPPRA) 1000 MG tablet  Take 1 tablet (1,000 mg total) by mouth 2 (two) times daily.  60 tablet  2  . losartan (COZAAR) 50 MG tablet Take 1 tablet (50 mg total) by mouth daily.  30 tablet  2  . methotrexate 2.5 MG tablet Take 10 mg by mouth once a week.       . Multiple Vitamin (MULTIVITAMIN) tablet Take 1 tablet by mouth daily.        . predniSONE (DELTASONE) 5 MG tablet Take 2.5 mg by mouth daily.       . simvastatin (ZOCOR) 20 MG tablet Take 20 mg by mouth every evening.       No current facility-administered medications on file prior to visit.    No Known Allergies  Family History  Problem Relation Age of Onset  . Breast cancer Mother     BP 132/76  Pulse 75  Temp(Src) 98.5 F (36.9 C) (Oral)  Ht 5\' 10"  (1.778 m)  Wt 166 lb (75.297 kg)  BMI 23.82 kg/m2  SpO2 98%  Review of Systems  Constitutional: Negative for fever and unexpected weight change.  HENT: Negative for hearing loss.   Eyes: Negative for visual disturbance.  Respiratory: Negative for shortness of breath.   Cardiovascular: Negative for chest pain.  Gastrointestinal: Negative for anal bleeding.  Endocrine: Negative for cold intolerance.  Genitourinary: Negative for hematuria.  Musculoskeletal: Negative for back pain.  Skin: Negative for rash.  Allergic/Immunologic: Negative for environmental allergies.  Neurological: Negative for syncope and numbness.  Hematological: Does not bruise/bleed easily.  Psychiatric/Behavioral: Negative for dysphoric mood.       Objective:   Physical Exam VS: see vs page GEN: no distress HEAD: head: no deformity eyes: no periorbital swelling, no proptosis external nose and ears are normal mouth: no lesion seen NECK: supple, thyroid slightly enlarged; no nodule palpable.  CHEST WALL: no deformity.  Old healed surgical scar (median sternotomy).   LUNGS:  Clear to auscultation.   BREASTS: sees gyn.   CV: reg rate and rhythm; 4/6 systolic murmur ABD: abdomen is soft, nontender.  no  hepatosplenomegaly.  not distended.  no hernia GENITALIA/RECTAL: sees gyn MUSCULOSKELETAL: muscle bulk and strength are grossly normal.  no obvious joint swelling.  gait is normal and steady EXTEMITIES: no deformity.  no ulcer on the feet.  feet are of normal color and temp.  no edema PULSES: dorsalis pedis intact bilat.  Left carotid bruit (vs transmitted murmur) NEURO:  cn 2-12 grossly intact.   readily moves all 4's.  sensation is intact to touch on the feet SKIN:  Normal texture and temperature.  No rash or suspicious lesion is visible.   NODES:  None palpable at the neck PSYCH: alert, well-oriented.  Does not appear anxious nor depressed. Lab Results  Component Value Date   WBC 8.9 07/06/2013   HGB 12.4 07/06/2013   HCT 37.9 07/06/2013   PLT 335.0 07/06/2013   GLUCOSE 91 11/29/2012   CHOL 184 11/30/2012   TRIG 63 11/30/2012   HDL 74 11/30/2012   LDLDIRECT 74.3 11/27/2010   LDLCALC 97 11/30/2012   ALT 7 11/29/2012   AST 15 11/29/2012   NA 142 11/29/2012   K 3.8 11/29/2012   CL 105 11/29/2012   CREATININE 0.62 11/29/2012   BUN 16 11/29/2012   CO2 23 11/29/2012   TSH 0.35 07/06/2013   INR 0.96 11/29/2012   HGBA1C 5.8* 11/30/2012  \     Assessment & Plan:  Wellness visit today, with problems stable, except as noted. we discussed code status.  pt requests full code, but would not want to be started or maintained on artificial life-support measures if there was not a reasonable chance of recovery.   Patient Instructions  blood and urine tests are being requested for you today.  We'll contact you with results. please consider these measures for your health:  minimize alcohol.  do not use tobacco products.  have a colonoscopy at least every 10 years from age 42.  Women should have an annual mammogram from age 73.  keep firearms safely stored.  always use seat belts.  have working smoke alarms in your home.  see an eye doctor and dentist regularly.  never drive under the influence of  alcohol or drugs (including prescription drugs).   Please return in 1 year.

## 2013-07-06 NOTE — Patient Instructions (Addendum)
blood and urine tests are being requested for you today.  We'll contact you with results. please consider these measures for your health:  minimize alcohol.  do not use tobacco products.  have a colonoscopy at least every 10 years from age 60.  Women should have an annual mammogram from age 34.  keep firearms safely stored.  always use seat belts.  have working smoke alarms in your home.  see an eye doctor and dentist regularly.  never drive under the influence of alcohol or drugs (including prescription drugs).   Please return in 1 year.

## 2013-07-23 ENCOUNTER — Encounter: Payer: Self-pay | Admitting: Internal Medicine

## 2013-08-06 ENCOUNTER — Ambulatory Visit (INDEPENDENT_AMBULATORY_CARE_PROVIDER_SITE_OTHER): Payer: 59 | Admitting: *Deleted

## 2013-08-06 DIAGNOSIS — I6350 Cerebral infarction due to unspecified occlusion or stenosis of unspecified cerebral artery: Secondary | ICD-10-CM

## 2013-08-06 DIAGNOSIS — I639 Cerebral infarction, unspecified: Secondary | ICD-10-CM

## 2013-08-10 ENCOUNTER — Other Ambulatory Visit: Payer: Self-pay

## 2013-08-10 MED ORDER — AMLODIPINE BESYLATE 5 MG PO TABS: 5.0000 mg | ORAL_TABLET | Freq: Every day | ORAL | Status: DC

## 2013-08-10 NOTE — Progress Notes (Signed)
Loop recorder 

## 2013-08-24 ENCOUNTER — Encounter: Payer: Self-pay | Admitting: Internal Medicine

## 2013-08-27 ENCOUNTER — Other Ambulatory Visit: Payer: Self-pay

## 2013-08-27 MED ORDER — LOSARTAN POTASSIUM 50 MG PO TABS: 50.0000 mg | ORAL_TABLET | Freq: Every day | ORAL | Status: DC

## 2013-09-05 ENCOUNTER — Ambulatory Visit (INDEPENDENT_AMBULATORY_CARE_PROVIDER_SITE_OTHER): Payer: 59 | Admitting: *Deleted

## 2013-09-05 DIAGNOSIS — I6350 Cerebral infarction due to unspecified occlusion or stenosis of unspecified cerebral artery: Secondary | ICD-10-CM

## 2013-09-05 DIAGNOSIS — I639 Cerebral infarction, unspecified: Secondary | ICD-10-CM

## 2013-09-06 LAB — MDC_IDC_ENUM_SESS_TYPE_REMOTE

## 2013-09-07 ENCOUNTER — Encounter: Payer: Self-pay | Admitting: *Deleted

## 2013-09-10 ENCOUNTER — Telehealth: Payer: Self-pay

## 2013-09-10 NOTE — Telephone Encounter (Signed)
ok 

## 2013-09-10 NOTE — Progress Notes (Signed)
Loop recorder 

## 2013-09-10 NOTE — Telephone Encounter (Signed)
Pt is requesting a refill of Keppra. Medication was last filled on 03/21/2013 and last ov was 07/06/2013.  Please advise if ok to refill during Dr. Cordelia Pen absence thanks!

## 2013-09-11 MED ORDER — LEVETIRACETAM 1000 MG PO TABS: 1000.0000 mg | ORAL_TABLET | Freq: Two times a day (BID) | ORAL | Status: DC

## 2013-09-11 NOTE — Telephone Encounter (Signed)
Rx sent to pharmacy   

## 2013-09-12 ENCOUNTER — Encounter: Payer: Self-pay | Admitting: Gastroenterology

## 2013-09-12 ENCOUNTER — Telehealth: Payer: Self-pay | Admitting: Neurology

## 2013-09-12 ENCOUNTER — Telehealth: Payer: Self-pay | Admitting: *Deleted

## 2013-09-12 ENCOUNTER — Ambulatory Visit (INDEPENDENT_AMBULATORY_CARE_PROVIDER_SITE_OTHER): Payer: 59 | Admitting: Gastroenterology

## 2013-09-12 DIAGNOSIS — Z7901 Long term (current) use of anticoagulants: Secondary | ICD-10-CM

## 2013-09-12 DIAGNOSIS — Z1211 Encounter for screening for malignant neoplasm of colon: Secondary | ICD-10-CM | POA: Insufficient documentation

## 2013-09-12 MED ORDER — MOVIPREP 100 G PO SOLR: 1.0000 | ORAL | Status: DC

## 2013-09-12 NOTE — Progress Notes (Signed)
I agree with screening colonoscopy if OK to hold her plavix

## 2013-09-12 NOTE — Patient Instructions (Signed)
You have been scheduled for a colonoscopy. Please follow written instructions given to you at your visit today.  We will call you once we hear from your doctor regarding the Plavix.    Please pick up your prep kit at the pharmacy within the next 1-3 days. [facility] [address]. If you use inhalers (even only as needed), please bring them with you on the day of your procedure. Your physician has requested that you go to www.startemmi.com and enter the access code given to you at your visit today. This web site gives a general overview about your procedure. However, you should still follow specific instructions given to you by our office regarding your preparation for the procedure.

## 2013-09-12 NOTE — Telephone Encounter (Signed)
09/12/2013   RE: Diana Walters DOB: 1955-02-17 MRN: [id number]   Dear Dr. Leonie Man,  [facility],   We have scheduled the above patient for an endoscopic procedure. Our records show that she is on anticoagulation therapy.   Please advise as to how long the patient may come off her therapy of Plaivx prior to the procedure, which is scheduled for 09-26-2013.  Please fax back/ or route the completed form to [place] at [phone].   Sincerely,    Alonza Bogus PA-C    [facility]

## 2013-09-12 NOTE — Telephone Encounter (Signed)
Pt seen once by [place] 12-29-12 after stroke 11-2012.  Plavix refilled by pcp, Dr. Renato Shin. See prior to clearance?

## 2013-09-12 NOTE — Progress Notes (Signed)
09/12/2013 DEMMI SINDT [id number] 20-Jul-1955   HISTORY OF PRESENT ILLNESS:  This is a pleasant 58 year old female who is previously known to Dr. Sharlett Iles for colonoscopy.  Last colonoscopy was in 02/2003 at which time she had some hyperplastic polyps removed.  She comes in today to schedule screening colonoscopy.  She is on Plavix since 11/2012 at which time she had a CVA.  She has history of thoracic aortic aneurysm repair at [facility] in 2010.  She reports some mild constipation but does not use anything for that; says that she still has a BM about every day or every other day.  Denies abdominal pain and rectal bleeding.    Past Medical History  Diagnosis Date  . HYPERCHOLESTEROLEMIA 04/01/2008  . ASTHMA 10/28/2006  . ANXIETY 10/28/2006  . ANEMIA 10/17/2009  . HYPERTENSION 10/28/2006  . 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 SEIZURE DISORDER 04/01/2008  . Menopause     age 4  . Duodenitis without mention of hemorrhage 2005  . Stroke 11/2012   Past Surgical History  Procedure Laterality Date  . Esophagogastroduodenoscopy  01/29/2004  . Tee without cardioversion N/A 12/01/2012    Procedure: TRANSESOPHAGEAL ECHOCARDIOGRAM (TEE);  Surgeon: Fay Records, MD;  Location: [facility] ENDOSCOPY;  Service: Cardiovascular;  Laterality: N/A;  . 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 Her smoking use included Cigarettes. She smoked 0.00 packs per day. She has never used smokeless tobacco. She reports that she does not drink alcohol or use illicit drugs. family history includes Breast cancer in her mother. No Known Allergies    Outpatient Encounter Prescriptions as of 09/12/2013  Medication Sig  . amLODipine (NORVASC) 5 MG tablet Take 1 tablet (5 mg total) by mouth daily.  . carvedilol (COREG) 3.125 MG tablet Take 3.125 mg by mouth 2 (two) times daily with a meal.  . clopidogrel (PLAVIX) 75 MG tablet Take 1  tablet (75 mg total) by mouth daily with breakfast.  . InFLIXimab (REMICADE IV) Inject into the vein. Due the 28th of October    At rheumatology  . levETIRAcetam (KEPPRA) 1000 MG tablet Take 1 tablet (1,000 mg total) by mouth 2 (two) times daily.  Marland Kitchen losartan (COZAAR) 50 MG tablet Take 1 tablet (50 mg total) by mouth daily.  . methotrexate 2.5 MG tablet Take 10 mg by mouth once a week.   . Multiple Vitamin (MULTIVITAMIN) tablet Take 1 tablet by mouth daily.    . predniSONE (DELTASONE) 5 MG tablet Take 2.5 mg by mouth daily.   . simvastatin (ZOCOR) 20 MG tablet Take 20 mg by mouth every evening.     REVIEW OF SYSTEMS  : All other systems reviewed and negative except where noted in the History of Present Illness.   PHYSICAL EXAM: There were no vitals taken for this visit. General: Well developed black female in no acute distress Head: Normocephalic and atraumatic Eyes:  Sclerae anicteric, conjunctiva pink. Ears: Normal auditory acuity Lungs: Clear throughout to auscultation Heart: Regular rate and rhythm. SEM noted. Abdomen: Soft, non-distended.  Normal bowel sounds.  Non-tender. Rectal:  Deferred.  Will be done at the time of colonoscopy. Musculoskeletal: Symmetrical with no gross deformities  Skin: No lesions on visible extremities Extremities: No edema  Neurological: Alert oriented x 4, grossly non-focal Psychological:  Alert and cooperative. Normal mood and affect  ASSESSMENT AND PLAN: -Screening colonoscopy:  Will schedule with Dr. Ardis Hughs.  The risks, benefits, and alternatives were discussed with the patient and she consents to proceed.  The risks benefits and alternatives to a temporary hold of anti-coagulants/anti-platelets for the procedure were discussed with the patient they consent to proceed. Obtain clearance to hold Plavix from Dr. Leonie Man.

## 2013-09-12 NOTE — Telephone Encounter (Signed)
Pam from [place] calling to check whether or not Dr. Leonie Man is the one that manages patient's Plavix. Patient will be having a colonoscopy done and they want to know if it's okay for her to come off Plavix for the procedure. Please return call and advise.

## 2013-09-13 ENCOUNTER — Encounter: Payer: Self-pay | Admitting: Internal Medicine

## 2013-09-13 NOTE — Telephone Encounter (Signed)
Ok to hold antiplatelet drug for 3-5 days prior to procedure and restart if she has been TIA/stroke free for more than 6 months

## 2013-09-13 NOTE — Telephone Encounter (Signed)
See Dr Erlinda Hong or [facility] Hawking and ok to refill Plavox

## 2013-09-14 NOTE — Telephone Encounter (Signed)
I called and left a message on the cell phone for [place].  I advised Dr. Leonie Man got back to [place] and advised she can stop the Plavix on 8-7 and resume it on 8-13.I told her to call me if she has any questions.

## 2013-09-17 ENCOUNTER — Encounter: Payer: 59 | Admitting: Internal Medicine

## 2013-09-18 ENCOUNTER — Other Ambulatory Visit: Payer: Self-pay | Admitting: Neurology

## 2013-09-18 DIAGNOSIS — I6350 Cerebral infarction due to unspecified occlusion or stenosis of unspecified cerebral artery: Secondary | ICD-10-CM

## 2013-09-18 MED ORDER — CLOPIDOGREL BISULFATE 75 MG PO TABS: 75.0000 mg | ORAL_TABLET | Freq: Every day | ORAL | Status: DC

## 2013-09-18 NOTE — Telephone Encounter (Signed)
Called pt to schedule an appt prior to clearance for colonoscopy, appt is with Jeani Hawking, NP on 09/19/13 and pt's Rx was sent for Plavix to pt's pharmacy, per Dr. Leonie Man. I advised the pt that if she has any other problems, questions or concerns to call the office. Pt verbalized understanding.

## 2013-09-19 ENCOUNTER — Encounter: Payer: Self-pay | Admitting: Nurse Practitioner

## 2013-09-19 ENCOUNTER — Ambulatory Visit (INDEPENDENT_AMBULATORY_CARE_PROVIDER_SITE_OTHER): Payer: 59 | Admitting: Nurse Practitioner

## 2013-09-19 VITALS — BP 195/78 | HR 85 | Ht 70.0 in | Wt 168.0 lb

## 2013-09-19 DIAGNOSIS — E785 Hyperlipidemia, unspecified: Secondary | ICD-10-CM

## 2013-09-19 DIAGNOSIS — I6350 Cerebral infarction due to unspecified occlusion or stenosis of unspecified cerebral artery: Secondary | ICD-10-CM

## 2013-09-19 DIAGNOSIS — R011 Cardiac murmur, unspecified: Secondary | ICD-10-CM | POA: Insufficient documentation

## 2013-09-19 NOTE — Patient Instructions (Addendum)
Continue Plavix for stroke prevention and strict control of lipids with LDL cholesterol goal below 100 mg percent. Exercise regularly, diet and maintain optimal body weight.  Medical clearance was given for upcoming screening colonoscopy.  Risks vs. Benefits were discussed. Check lipid panel at next visit with PCP, fasting.  Follow up in 6 months, sooner as needed.   Stroke Prevention Some medical conditions and behaviors are associated with an increased chance of having a stroke. You may prevent a stroke by making healthy choices and managing medical conditions. HOW CAN I REDUCE MY RISK OF HAVING A STROKE?   Stay physically active. Get at least 30 minutes of activity on most or all days.  Do not smoke. It may also be helpful to avoid exposure to secondhand smoke.  Limit alcohol use. Moderate alcohol use is considered to be:  No more than 2 drinks per day for men.  No more than 1 drink per day for nonpregnant women.  Eat healthy foods. This involves:  Eating 5 or more servings of fruits and vegetables a day.  Making dietary changes that address high blood pressure (hypertension), high cholesterol, diabetes, or obesity.  Manage your cholesterol levels.  Making food choices that are high in fiber and low in saturated fat, trans fat, and cholesterol may control cholesterol levels.  Take any prescribed medicines to control cholesterol as directed by your health care provider.  Manage your diabetes.  Controlling your carbohydrate and sugar intake is recommended to manage diabetes.  Take any prescribed medicines to control diabetes as directed by your health care provider.  Control your hypertension.  Making food choices that are low in salt (sodium), saturated fat, trans fat, and cholesterol is recommended to manage hypertension.  Take any prescribed medicines to control hypertension as directed by your health care provider.  Maintain a healthy weight.  Reducing calorie intake  and making food choices that are low in sodium, saturated fat, trans fat, and cholesterol are recommended to manage weight.  Stop drug abuse.  Avoid taking birth control pills.  Talk to your health care provider about the risks of taking birth control pills if you are over 39 years old, smoke, get migraines, or have ever had a blood clot.  Get evaluated for sleep disorders (sleep apnea).  Talk to your health care provider about getting a sleep evaluation if you snore a lot or have excessive sleepiness.  Take medicines only as directed by your health care provider.  For some people, aspirin or blood thinners (anticoagulants) are helpful in reducing the risk of forming abnormal blood clots that can lead to stroke. If you have the irregular heart rhythm of atrial fibrillation, you should be on a blood thinner unless there is a good reason you cannot take them.  Understand all your medicine instructions.  Make sure that other conditions (such as anemia or atherosclerosis) are addressed. SEEK IMMEDIATE MEDICAL CARE IF:   You have sudden weakness or numbness of the face, arm, or leg, especially on one side of the body.  Your face or eyelid droops to one side.  You have sudden confusion.  You have trouble speaking (aphasia) or understanding.  You have sudden trouble seeing in one or both eyes.  You have sudden trouble walking.  You have dizziness.  You have a loss of balance or coordination.  You have a sudden, severe headache with no known cause.  You have new chest pain or an irregular heartbeat. Any of these symptoms may represent a  serious problem that is an emergency. Do not wait to see if the symptoms will go away. Get medical help at once. Call your local emergency services (911 in [place] Do not drive yourself to the hospital. Document Released: 03/11/2004 Document Revised: 06/18/2013 Document Reviewed: 08/04/2012 [facility] Patient Information 2015 [place], [place]. This  information is not intended to replace advice given to you by your health care provider. Make sure you discuss any questions you have with your health care provider.

## 2013-09-19 NOTE — Progress Notes (Signed)
PATIENT: Diana Walters DOB: September 11, 1955  REASON FOR VISIT: routine follow up for stroke HISTORY FROM: patient  HISTORY OF PRESENT ILLNESS: 58 year African American lady seen for first office followup visit following a recent right brain stroke in October 2014.   Update 09/19/13 (LL):  Patient comes to office seeking medical clearance to hold Plavix to have colonoscopy.  Last visit was 12/29/12 with Dr. Leonie Man. She denies any repeat stroke symptoms.  She has not had follow up for her TAA repair at [facility] in over a year. Her blood pressure in right arm is significantly elevated compared to the left arm reading, 195/78 in R, 116/83 in L. She states she is currently having a RA flare, and is in pain.  She has a LINQ loop recorder which has not detected atrial fibrillation to date.  She states she quit smoking last year. She is not certain if her cholesterol has been checked in the last 6 months.  Her dysarthria from stroke has resolved.  12/29/12 (PS):  She was admitted on 11/30/38 and when she woke up for drooling of the left side of her mouth and dysarthria. CT scan of the head showed right frontal opercular hypodensity an MRI scan confirmed acute nonhemorrhagic infarct involving the right insular cortex and operculum as well as multiple remote lacunar infarcts involving left pons and right cerebellum. There also multiple punctate areas of microhemorrhages scattered throughout the brain due to small vessel disease. Transthoracic echo showed normal ejection fraction. TEE showed a small PFO but no clot. Total cholesterol was 184, triglycerides 60, HDL 74 and LDL 97 mg percent. Hemoglobin and 1C was 5.8%. Urine drug screen was positive for marijuana. CT angiogram of the chest showed old para thoracic aorta with stent and graft but no endovascular leak. Carotid Doppler showed 1-39% bilateral ICA stenosis. Left vertebral artery flow is retrograde. She had remote history of seizures which was stable on Keppra.  She is advised to continue Plavix because of her peripheral vascular disease and maintain strict control of hypertension and hyperlipidemia. She states she has done well since discharge and has not had stroke symptoms. She states her blood pressure is under good control though she forgot to take her medication this morning and her blood pressure was significantly elevated in office today at 210/93.  REVIEW OF SYSTEMS: Full 14 system review of systems performed and notable only for:  fatigue, joint pain and swelling, murmur.  ALLERGIES: No Known Allergies  HOME MEDICATIONS: Outpatient Prescriptions Prior to Visit  Medication Sig Dispense Refill  . amLODipine (NORVASC) 5 MG tablet Take 1 tablet (5 mg total) by mouth daily.  30 tablet  2  . carvedilol (COREG) 3.125 MG tablet Take 3.125 mg by mouth 2 (two) times daily with a meal.      . clopidogrel (PLAVIX) 75 MG tablet Take 1 tablet (75 mg total) by mouth daily with breakfast.  30 tablet  3  . InFLIXimab (REMICADE IV) Inject into the vein. Due the 28th of October    At rheumatology      . levETIRAcetam (KEPPRA) 1000 MG tablet Take 1 tablet (1,000 mg total) by mouth 2 (two) times daily.  60 tablet  2  . losartan (COZAAR) 50 MG tablet Take 1 tablet (50 mg total) by mouth daily.  30 tablet  2  . methotrexate 2.5 MG tablet Take 10 mg by mouth once a week.       Marland Kitchen MOVIPREP 100 G SOLR Take 1  kit (200 g total) by mouth as directed.  1 kit  0  . Multiple Vitamin (MULTIVITAMIN) tablet Take 1 tablet by mouth daily.        . predniSONE (DELTASONE) 5 MG tablet Take 2.5 mg by mouth daily.       . simvastatin (ZOCOR) 20 MG tablet Take 20 mg by mouth every evening.       No facility-administered medications prior to visit.    PHYSICAL EXAM Filed Vitals:   09/19/13 1126 09/19/13 1209  BP: 116/83 195/78  Pulse: 80 85  Height: '5\' 10"'  (1.778 m)   Weight: 168 lb (76.204 kg)    Body mass index is 24.11 kg/(m^2).  Physical Exam General: Frail middle-aged  African American lady, seated, in no evident distress Head: head normocephalic and atraumatic. Orohparynx benign Neck: supple with no carotid or supraclavicular bruits Cardiovascular: regular rate and rhythm, 4/6 systolic murmur Musculoskeletal: no deformity Skin:  no rash/petichiae  Neurologic Exam Mental Status: Awake and fully alert. Oriented to place and time. Recent and remote memory intact. Attention span, concentration and fund of knowledge appropriate. Mood and affect appropriate.  Cranial Nerves: Fundoscopic exam not done. Pupils equal, briskly reactive to light. Extraocular movements full without nystagmus. Visual fields full to confrontation. Hearing intact. Facial sensation intact. Face, tongue, palate moves normally and symmetrically.  Motor: Normal bulk and tone. Normal strength in all tested extremity muscles, with minimal effort due to pain.  Orbits right over left upper extremity. Mild weakness of left grip only Sensory: intact to light touch in all 4 extremities Coordination: Rapid alternating movements normal in all extremities. Finger-to-nose and heel-to-shin performed accurately bilaterally. Gait and Station: Arises from chair without difficulty. Stance is normal. Gait demonstrates normal stride length and balance . Able to heel, toe and tandem walk without difficulty.  Reflexes: 1+ and symmetric. Toes downgoing.  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 Vascular risk factors of HT, Hyperlipidimia, former smoker, and PVD. Patient with no residual deficits.  PLAN: I had a long discussion with the patient with regards to stroke, risk factor modification and answered questions. Continue Plavix for stroke prevention and strict control of lipids with LDL cholesterol goal below 100 mg percent. Exercise regularly, diet and maintain optimal  body weight.  Medical clearance was given for upcoming screening colonoscopy. Risks versus benefits were discussed. Check lipid panel at next regular visit with PCP, fasting, patient has eaten today.  Carotid US to be checked in 6 months. Follow up in 6 months, sooner as needed.   Orders Placed This Encounter  Procedures  . Lipid Panel   Return in about 6 months (around 03/22/2014) for stroke follow up.  Rudi Rummage Jaxsyn Catalfamo, MSN, FNP-BC, A/GNP-C 09/19/2013, 12:32 PM [facility] [address], [place], [place] [address] [phone]  Note: This document was prepared with digital dictation and possible smart phrase technology. Any transcriptional errors that result from this process are unintentional.

## 2013-09-20 NOTE — Progress Notes (Signed)
I agree with the above plan 

## 2013-09-25 ENCOUNTER — Telehealth: Payer: Self-pay | Admitting: Gastroenterology

## 2013-09-25 NOTE — Telephone Encounter (Signed)
Pt called back and states she had started the Moviprep too early at 10:00am instead of 5:00pm.She states she did have great results. Pt has still been on clear liquids all day and having good results from the prep. Informed pt to do the second part of the prep at 11:00am as ordered on 08/12 and continue clear liquids .She will call back if she is not cleaned out or has any further questions.

## 2013-09-25 NOTE — Telephone Encounter (Signed)
Called pt and left message we had returned their call and to call our office.

## 2013-09-26 ENCOUNTER — Ambulatory Visit (AMBULATORY_SURGERY_CENTER): Payer: 59 | Admitting: Gastroenterology

## 2013-09-26 ENCOUNTER — Encounter: Payer: Self-pay | Admitting: Gastroenterology

## 2013-09-26 VITALS — BP 162/69 | HR 63 | Temp 99.3°F | Resp 16 | Ht 70.0 in | Wt 168.0 lb

## 2013-09-26 DIAGNOSIS — Z1211 Encounter for screening for malignant neoplasm of colon: Secondary | ICD-10-CM

## 2013-09-26 MED ORDER — SODIUM CHLORIDE 0.9 % IV SOLN: 500.0000 mL | INTRAVENOUS | Status: DC

## 2013-09-26 NOTE — Progress Notes (Signed)
Procedure ends, to recovery, report given and VSS. 

## 2013-09-26 NOTE — Patient Instructions (Addendum)
RESTART YOUR PLAVIX TODAY.   YOU HAD AN ENDOSCOPIC PROCEDURE TODAY AT [facility]: Refer to the procedure report that was given to you for any specific questions about what was found during the examination.  If the procedure report does not answer your questions, please call your gastroenterologist to clarify.  If you requested that your care partner not be given the details of your procedure findings, then the procedure report has been included in a sealed envelope for you to review at your convenience later.  YOU SHOULD EXPECT: Some feelings of bloating in the abdomen. Passage of more gas than usual.  Walking can help get rid of the air that was put into your GI tract during the procedure and reduce the bloating. If you had a lower endoscopy (such as a colonoscopy or flexible sigmoidoscopy) you may notice spotting of blood in your stool or on the toilet paper. If you underwent a bowel prep for your procedure, then you may not have a normal bowel movement for a few days.  DIET: Your first meal following the procedure should be a light meal and then it is ok to progress to your normal diet.  A half-sandwich or bowl of soup is an example of a good first meal.  Heavy or fried foods are harder to digest and may make you feel nauseous or bloated.  Likewise meals heavy in dairy and vegetables can cause extra gas to form and this can also increase the bloating.  Drink plenty of fluids but you should avoid alcoholic beverages for 24 hours.  ACTIVITY: Your care partner should take you home directly after the procedure.  You should plan to take it easy, moving slowly for the rest of the day.  You can resume normal activity the day after the procedure however you should NOT DRIVE or use heavy machinery for 24 hours (because of the sedation medicines used during the test).    SYMPTOMS TO REPORT IMMEDIATELY: A gastroenterologist can be reached at any hour.  During normal business hours, 8:30 AM to  5:00 PM Monday through Friday, call [phone].  After hours and on weekends, please call the GI answering service at [phone] who will take a message and have the physician on call contact you.   Following lower endoscopy (colonoscopy or flexible sigmoidoscopy):  Excessive amounts of blood in the stool  Significant tenderness or worsening of abdominal pains  Swelling of the abdomen that is new, acute  Fever of 100F or higher  FOLLOW UP: If any biopsies were taken you will be contacted by phone or by letter within the next 1-3 weeks.  Call your gastroenterologist if you have not heard about the biopsies in 3 weeks.  Our staff will call the home number listed on your records the next business day following your procedure to check on you and address any questions or concerns that you may have at that time regarding the information given to you following your procedure. This is a courtesy call and so if there is no answer at the home number and we have not heard from you through the emergency physician on call, we will assume that you have returned to your regular daily activities without incident.  SIGNATURES/CONFIDENTIALITY: You and/or your care partner have signed paperwork which will be entered into your electronic medical record.  These signatures attest to the fact that that the information above on your After Visit Summary has been reviewed and is understood.  Full  responsibility of the confidentiality of this discharge information lies with you and/or your care-partner.

## 2013-09-26 NOTE — Progress Notes (Signed)
Pt's blood pressure is high today.  167/145 left arm, 211/111 right arm.  Pt laying on stretcher manually 220/112.  Per the pt she did not take her b/p meds this am since she did not have any food on her stomach.  Waldo Laine, RN reported these b/p to Holland Falling, CRNA.  He said "Okay". maw

## 2013-09-26 NOTE — Op Note (Signed)
[facility]  [address] [place], [id number]   COLONOSCOPY PROCEDURE REPORT  PATIENT: Diana Walters, Diana Walters  MR#: [id number] BIRTHDATE: 06-02-1955 , 72  yrs. old GENDER: Female ENDOSCOPIST: Milus Banister, MD PROCEDURE DATE:  09/26/2013 PROCEDURE:   Colonoscopy, screening First Screening Colonoscopy - Avg.  risk and is 50 yrs.  old or older - No.  Prior Negative Screening - Now for repeat screening. 10 or more years since last screening  History of Adenoma - Now for follow-up colonoscopy & has been > or = to 3 yrs.  N/A  Polyps Removed Today? No.  Recommend repeat exam, <10 yrs? No. ASA CLASS:   Class III INDICATIONS:average risk screening. MEDICATIONS: MAC sedation, administered by CRNA and Propofol (Diprivan) 270 mg IV  DESCRIPTION OF PROCEDURE:   After the risks benefits and alternatives of the procedure were thoroughly explained, informed consent was obtained.  A digital rectal exam revealed no abnormalities of the rectum.   The LB [id number] and LB [id number]  endoscope was introduced through the anus and advanced to the cecum, which was identified by both the appendix and ileocecal valve. No adverse events experienced.   The quality of the prep was excellent.  The instrument was then slowly withdrawn as the colon was fully examined.   COLON FINDINGS: A normal appearing cecum, ileocecal valve, and appendiceal orifice were identified.  The ascending, hepatic flexure, transverse, splenic flexure, descending, sigmoid colon and rectum appeared unremarkable.  No polyps or cancers were seen. Retroflexed views revealed no abnormalities. The time to cecum=3 minutes 41 seconds.  Withdrawal time=6 minutes 52 seconds.  The scope was withdrawn and the procedure completed. COMPLICATIONS: There were no complications.  ENDOSCOPIC IMPRESSION: Normal colon No polyps or cancers  RECOMMENDATIONS: You should continue to follow colorectal cancer screening  guidelines for "routine risk" patients with a repeat colonoscopy in 10 years.   eSigned:  Milus Banister, MD 09/26/2013 3:57 PM   cc: Renato Shin, MD

## 2013-09-28 ENCOUNTER — Telehealth: Payer: Self-pay | Admitting: *Deleted

## 2013-09-28 NOTE — Telephone Encounter (Signed)
  Follow up Call-  Call back number 09/26/2013  Post procedure Call Back phone  # [phone] cell   Permission to leave phone message Yes     Patient questions:  Do you have a fever, pain , or abdominal swelling? No. Pain Score  0 *  Have you tolerated food without any problems? Yes.    Have you been able to return to your normal activities? Yes.    Do you have any questions about your discharge instructions: Diet   No. Medications  No. Follow up visit  No.  Do you have questions or concerns about your Care? No.  Actions: * If pain score is 4 or above: No action needed, pain <4.

## 2013-10-05 ENCOUNTER — Ambulatory Visit (INDEPENDENT_AMBULATORY_CARE_PROVIDER_SITE_OTHER): Payer: 59 | Admitting: *Deleted

## 2013-10-05 DIAGNOSIS — I6350 Cerebral infarction due to unspecified occlusion or stenosis of unspecified cerebral artery: Secondary | ICD-10-CM

## 2013-10-05 DIAGNOSIS — I639 Cerebral infarction, unspecified: Secondary | ICD-10-CM

## 2013-10-05 LAB — MDC_IDC_ENUM_SESS_TYPE_REMOTE

## 2013-10-11 NOTE — Progress Notes (Signed)
Loop recorder 

## 2013-10-22 LAB — MDC_IDC_ENUM_SESS_TYPE_REMOTE

## 2013-10-26 LAB — MDC_IDC_ENUM_SESS_TYPE_REMOTE

## 2013-10-29 ENCOUNTER — Other Ambulatory Visit: Payer: Self-pay

## 2013-10-31 ENCOUNTER — Ambulatory Visit (INDEPENDENT_AMBULATORY_CARE_PROVIDER_SITE_OTHER): Payer: 59 | Admitting: Internal Medicine

## 2013-10-31 ENCOUNTER — Encounter: Payer: Self-pay | Admitting: Internal Medicine

## 2013-10-31 VITALS — BP 240/86 | HR 86 | Ht 69.0 in | Wt 169.6 lb

## 2013-10-31 DIAGNOSIS — R001 Bradycardia, unspecified: Secondary | ICD-10-CM

## 2013-10-31 DIAGNOSIS — I119 Hypertensive heart disease without heart failure: Secondary | ICD-10-CM

## 2013-10-31 DIAGNOSIS — Z8673 Personal history of transient ischemic attack (TIA), and cerebral infarction without residual deficits: Secondary | ICD-10-CM

## 2013-10-31 DIAGNOSIS — E785 Hyperlipidemia, unspecified: Secondary | ICD-10-CM

## 2013-10-31 DIAGNOSIS — I712 Thoracic aortic aneurysm, without rupture, unspecified: Secondary | ICD-10-CM

## 2013-10-31 DIAGNOSIS — I498 Other specified cardiac arrhythmias: Secondary | ICD-10-CM

## 2013-10-31 DIAGNOSIS — I10 Essential (primary) hypertension: Secondary | ICD-10-CM

## 2013-10-31 LAB — MDC_IDC_ENUM_SESS_TYPE_INCLINIC
MDC IDC SESS DTM: 20150916140945
MDC IDC SET ZONE DETECTION INTERVAL: 2000 ms
MDC IDC SET ZONE DETECTION INTERVAL: 350 ms
Zone Setting Detection Interval: 3000 ms

## 2013-10-31 NOTE — Patient Instructions (Signed)
Your physician recommends that you schedule a follow-up appointment AS NEEDED with Dr. Rayann Heman.  You have been referred to Dorris Carnes, MD for general cardiology.   You have been referred to Minda Meo, MD for thoracic stent graft follow up.   Your physician recommends that you continue on your current medications as directed. Please refer to the Current Medication list given to you today.

## 2013-11-01 ENCOUNTER — Encounter: Payer: Self-pay | Admitting: Internal Medicine

## 2013-11-03 DIAGNOSIS — Z8673 Personal history of transient ischemic attack (TIA), and cerebral infarction without residual deficits: Secondary | ICD-10-CM | POA: Insufficient documentation

## 2013-11-03 DIAGNOSIS — I119 Hypertensive heart disease without heart failure: Secondary | ICD-10-CM | POA: Insufficient documentation

## 2013-11-03 NOTE — Progress Notes (Signed)
PCP:  Renato Shin, MD  The patient presents today for electrophysiology followup.  She underwent ILR implant by me 12/01/12 according to [place] Cryptogenic stroke protocol.  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   She continues to make slow recovery from her stroke.  Today, she denies symptoms of palpitations, chest pain, shortness of breath, orthopnea, PND, lower extremity edema, dizziness, presyncope, syncope, or neurologic sequela.  The patient feels that she is tolerating medications without difficulties and is otherwise without complaint today.   Past Medical History  Diagnosis Date  . HYPERCHOLESTEROLEMIA 04/01/2008  . ANXIETY 10/28/2006  . ANEMIA 10/17/2009  . HYPERTENSION 10/28/2006  . 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 SEIZURE DISORDER 04/01/2008  . Menopause     age 48  . Duodenitis without mention of hemorrhage 2005  . Stroke 11/2012  . ASTHMA 10/28/2006    no inhalers per pt  . Arthritis   . Blood transfusion without reported diagnosis   . Depression   . Heart murmur   . Osteoporosis     bil knees   Past Surgical History  Procedure Laterality Date  . Esophagogastroduodenoscopy  01/29/2004  . Tee without cardioversion N/A 12/01/2012    Procedure: TRANSESOPHAGEAL ECHOCARDIOGRAM (TEE);  Surgeon: Fay Records, MD;  Location[facility];  Service: Cardiovascular;  Laterality: N/A;  . Loop recorder implant  12-01-2012    MDT LinQ implanted by Dr Rayann Heman for cyrptogenic stroke    Current Outpatient Prescriptions  Medication Sig Dispense Refill  . amLODipine (NORVASC) 5 MG tablet Take 1 tablet (5 mg total) by mouth daily.  30 tablet  2  . carvedilol (COREG) 3.125 MG tablet Take 3.125 mg by mouth 2 (two) times daily with a meal.      . clopidogrel (PLAVIX) 75 MG tablet Take 1 tablet (75 mg total) by mouth daily with breakfast.  30 tablet  3  . HYDROcodone-acetaminophen  (NORCO/VICODIN) 5-325 MG per tablet Take 1 tablet by mouth every 6 (six) hours as needed (pain).       . InFLIXimab (REMICADE IV) Inject into the vein. Due the 28th of October    At rheumatology      . levETIRAcetam (KEPPRA) 1000 MG tablet Take 1 tablet (1,000 mg total) by mouth 2 (two) times daily.  60 tablet  2  . losartan (COZAAR) 50 MG tablet Take 1 tablet (50 mg total) by mouth daily.  30 tablet  2  . methotrexate 2.5 MG tablet Take 10 mg by mouth once a week.       . Multiple Vitamin (MULTIVITAMIN) tablet Take 1 tablet by mouth daily.        . predniSONE (DELTASONE) 1 MG tablet Take 0.5 mg by mouth daily with breakfast.      . simvastatin (ZOCOR) 20 MG tablet Take 20 mg by mouth every evening.       No current facility-administered medications for this visit.    No Known Allergies  History   Social History  . Marital Status: Single    Spouse Name: N/A    Number of Children: N/A  . Years of Education: N/A   Occupational History  . Not on file.   Social History Main Topics  . Smoking status: Light Tobacco Smoker -- 0.25 packs/day    Types: Cigarettes  . Smokeless tobacco: Never Used  . Alcohol Use: No  . Drug Use: Yes  Special: Marijuana  . Sexual Activity: Not on file   Other Topics Concern  . Not on file   Social History Narrative  . No narrative on file    Family History  Problem Relation Age of Onset  . Breast cancer Mother   . Colon cancer Neg Hx   . Esophageal cancer Neg Hx   . Pancreatic cancer Neg Hx   . Rectal cancer Neg Hx   . Stomach cancer Neg Hx     ROS-  All systems are reviewed and are negative except as outlined in the HPI above  Physical Exam: Filed Vitals:   10/31/13 1041  BP: 240/86  Pulse: 86  Height: 5\' 9"  (1.753 m)  Weight: 169 lb 9.6 oz (76.93 kg)  Markedly asymmetric blood pressure readings: By MD: R arm BP is 210/90 and L arm is 120/84  GEN- The patient is chronically ill appearing, alert and oriented x 3 today.  Walks  slowly Head- normocephalic, atraumatic Eyes-  Sclera clear, conjunctiva pink Ears- hearing intact Oropharynx- clear Neck- supple,  Lungs- Clear to ausculation bilaterally, normal work of breathing Heart- Regular rate and rhythm, 3/6 SEM throughout the upper chest GI- soft, NT, ND, + BS Extremities- no clubbing, cyanosis, or edema, decreased pulses on the L arm MS- no significant deformity or atrophy Skin- no rash or lesion Psych- euthymic mood, full affect Neuro- strength and sensation are intact  Implantable loop recorder (ILR) interrogation reviewed- see paceArt  Assessment and Plan:  1. Cryptogenic stroke No afib detected by her implantable monitor Management per neurology  2. H/o thoracic aneurysm with stent graft and prior leak (per patient) previously followed at [facility]. Ct scan from 11/29/13 is reviewed and does not reveal leak.  The left subclavian artery remains covered by  graft, but it continues to fill in the arterial phase. This correlates with slow flow in the left vertebral on MRA 11/29/2012 and likely explains the asymmetric blood pressure readings today.  She wishes to establish with a vascular surgeon in [place] for further follow-up and does not wish to continue to travel to [place] (she says she has not been for follow-up there recently).  3. Hypertensive cardiovascular disease She has markedly asymmetric blood pressure readings.  This makes management of her systemic hypertension very difficult. I will refer to vascular surgery as above I will also refer to Dr Harrington Challenger for general cardiology care going forward.  I will follow her ILR with [facility] and will see her back in the office on an as needed basis.

## 2013-11-05 ENCOUNTER — Ambulatory Visit (INDEPENDENT_AMBULATORY_CARE_PROVIDER_SITE_OTHER): Payer: 59 | Admitting: *Deleted

## 2013-11-05 DIAGNOSIS — I639 Cerebral infarction, unspecified: Secondary | ICD-10-CM

## 2013-11-05 DIAGNOSIS — I6350 Cerebral infarction due to unspecified occlusion or stenosis of unspecified cerebral artery: Secondary | ICD-10-CM

## 2013-11-06 ENCOUNTER — Other Ambulatory Visit: Payer: Self-pay | Admitting: Endocrinology

## 2013-11-06 NOTE — Progress Notes (Signed)
Loop recorder 

## 2013-11-07 ENCOUNTER — Encounter: Payer: Self-pay | Admitting: Internal Medicine

## 2013-11-13 LAB — MDC_IDC_ENUM_SESS_TYPE_REMOTE

## 2013-11-14 ENCOUNTER — Encounter: Payer: Self-pay | Admitting: Gastroenterology

## 2013-11-21 ENCOUNTER — Encounter: Payer: Self-pay | Admitting: Internal Medicine

## 2013-11-29 ENCOUNTER — Encounter: Payer: Self-pay | Admitting: Internal Medicine

## 2013-11-30 ENCOUNTER — Encounter: Payer: Self-pay | Admitting: Surgery

## 2013-12-03 ENCOUNTER — Encounter: Payer: 59 | Admitting: Surgery

## 2013-12-04 ENCOUNTER — Ambulatory Visit (INDEPENDENT_AMBULATORY_CARE_PROVIDER_SITE_OTHER): Payer: 59 | Admitting: *Deleted

## 2013-12-04 DIAGNOSIS — I639 Cerebral infarction, unspecified: Secondary | ICD-10-CM

## 2013-12-04 LAB — MDC_IDC_ENUM_SESS_TYPE_REMOTE

## 2013-12-05 ENCOUNTER — Other Ambulatory Visit: Payer: Self-pay | Admitting: Endocrinology

## 2013-12-05 NOTE — Telephone Encounter (Signed)
please call patient: Who prescribes this medication for you?  i think it is a neurologist.  Please let us know

## 2013-12-05 NOTE — Telephone Encounter (Signed)
Contacted pt and she states that she will contact her neurologist to refill prescription.

## 2013-12-07 ENCOUNTER — Encounter: Payer: Self-pay | Admitting: Surgery

## 2013-12-10 ENCOUNTER — Ambulatory Visit (INDEPENDENT_AMBULATORY_CARE_PROVIDER_SITE_OTHER): Payer: 59 | Admitting: Surgery

## 2013-12-10 ENCOUNTER — Encounter: Payer: Self-pay | Admitting: Surgery

## 2013-12-10 VITALS — BP 132/60 | HR 74 | Resp 16 | Ht 69.5 in | Wt 170.0 lb

## 2013-12-10 DIAGNOSIS — I7122 Aneurysm of the aortic arch, without rupture: Secondary | ICD-10-CM

## 2013-12-10 DIAGNOSIS — I712 Thoracic aortic aneurysm, without rupture: Secondary | ICD-10-CM

## 2013-12-10 DIAGNOSIS — IMO0001 Reserved for inherently not codable concepts without codable children: Secondary | ICD-10-CM

## 2013-12-10 DIAGNOSIS — T82330D Leakage of aortic (bifurcation) graft (replacement), subsequent encounter: Secondary | ICD-10-CM

## 2013-12-10 NOTE — Progress Notes (Signed)
Patient name: Diana Walters MRN: [id number] DOB: 1955-07-29 Sex: female   Referred by: Dr. Rayann Heman  Reason for referral:  Chief Complaint  Patient presents with  . New Evaluation    AAA leak    HISTORY OF PRESENT ILLNESS: This is a very pleasant 58 year old female who comes in today for transfer of care regarding her thoracic aortic aneurysm.  The patient reports that this was repaired at [facility] last year.  This appears to be a hybrid repair with aortic arch de-branching and endovascular stenting.  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  She was transferred to [facility] for further workup.  The patient states that no intervention was performed.  The patient is seen by cardiology for heart block.  She denies having had a heart attack.  She did have a stroke one year ago with left-sided facial drooping and slurred speech which she feels has improved.  The patient takes simvastatin for hypercholesterolemia.  She is on multiple medications for blood pressure including an ARB.  She is on dual antiplatelet therapy with aspirin and Plavix.  She continues to smoke approximately 3-4 cigarettes per day.  The patient is on immunosuppression medicine for her rheumatoid arthritis.  She also takes anti-seizure medication.    Past Medical History  Diagnosis Date  . HYPERCHOLESTEROLEMIA 04/01/2008  . ANXIETY 10/28/2006  . ANEMIA 10/17/2009  . HYPERTENSION 10/28/2006  . 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 SEIZURE DISORDER 04/01/2008  . Menopause     age 61  . Duodenitis without mention of hemorrhage 2005  . Stroke 11/2012  . ASTHMA 10/28/2006    no inhalers per pt  . Arthritis   . Blood transfusion without reported diagnosis   . Depression   . Heart murmur   . Osteoporosis     bil knees    Past Surgical History  Procedure Laterality Date  . Esophagogastroduodenoscopy  01/29/2004  . Tee without cardioversion N/A 12/01/2012    Procedure: TRANSESOPHAGEAL ECHOCARDIOGRAM (TEE);   Surgeon: Fay Records, MD;  Location: [facility] ENDOSCOPY;  Service: Cardiovascular;  Laterality: N/A;  . 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 Marital Status: Single    Spouse Name: N/A    Number of Children: N/A  . Years of Education: N/A   Occupational History  . Not on file.   Social History Main Topics  . Smoking status: Light Tobacco Smoker -- 0.25 packs/day    Types: Cigarettes  . Smokeless tobacco: Never Used  . Alcohol Use: No  . Drug Use: Yes    Special: Marijuana  . Sexual Activity: Not on file   Other Topics Concern  . Not on file   Social History Narrative  . No narrative on file    Family History  Problem Relation Age of Onset  . Breast cancer Mother   . Colon cancer Neg Hx   . Esophageal cancer Neg Hx   . Pancreatic cancer Neg Hx   . Rectal cancer Neg Hx   . Stomach cancer Neg Hx     Allergies as of 12/10/2013  . (No Known Allergies)    Current Outpatient Prescriptions on File Prior to Visit  Medication Sig Dispense Refill  . amLODipine (NORVASC) 5 MG tablet TAKE 1 TABLET BY MOUTH EVERY DAY  30 tablet  2  . carvedilol (  COREG) 3.125 MG tablet Take 3.125 mg by mouth 2 (two) times daily with a meal.      . clopidogrel (PLAVIX) 75 MG tablet Take 1 tablet (75 mg total) by mouth daily with breakfast.  30 tablet  3  . HYDROcodone-acetaminophen (NORCO/VICODIN) 5-325 MG per tablet Take 1 tablet by mouth every 6 (six) hours as needed (pain).       . InFLIXimab (REMICADE IV) Inject into the vein. Due the 28th of October    At rheumatology      . levETIRAcetam (KEPPRA) 1000 MG tablet TAKE 1 TABLET BY MOUTH TWICE A DAY  60 tablet  2  . losartan (COZAAR) 50 MG tablet TAKE 1 TABLET BY MOUTH EVERY DAY  30 tablet  2  . methotrexate 2.5 MG tablet Take 10 mg by mouth once a week.       . Multiple Vitamin (MULTIVITAMIN) tablet Take 1 tablet by mouth daily.        . predniSONE (DELTASONE)  1 MG tablet Take 0.5 mg by mouth daily with breakfast.      . simvastatin (ZOCOR) 20 MG tablet Take 20 mg by mouth every evening.       No current facility-administered medications on file prior to visit.     REVIEW OF SYSTEMS: Cardiovascular: No chest pain, chest pressure, palpitations, orthopnea, or dyspnea on exertion. No claudication or rest pain,  No history of DVT or phlebitis. Pulmonary: No productive cough, asthma or wheezing. Neurologic: No weakness, paresthesias, aphasia, or amaurosis. No dizziness. Hematologic: No bleeding problems or clotting disorders. Musculoskeletal: No joint pain or joint swelling. Gastrointestinal: No blood in stool or hematemesis Genitourinary: No dysuria or hematuria. Psychiatric:: No history of major depression. Integumentary: No rashes or ulcers. Constitutional: No fever or chills.  PHYSICAL EXAMINATION: General: The patient appears their stated age.  Vital signs are BP 132/60  Pulse 74  Resp 16  Ht 5' 9.5" (1.765 m)  Wt 170 lb (77.111 kg)  BMI 24.75 kg/m2 HEENT:  No gross abnormalities Pulmonary: Respirations are non-labored Abdomen: Soft and non-tender  Musculoskeletal: There are no major deformities.   Neurologic: No focal weakness or paresthesias are detected, Skin: There are no ulcer or rashes noted. Psychiatric: The patient has normal affect. Cardiovascular: There is a regular rate and rhythm without significant murmur appreciated.  Diagnostic Studies: I have reviewed her CT angiogram which shows resolution of her endoleak.    Assessment:  Aneurysm: Status post hybrid repair of aortic arch aneurysm the patient wishes to transfer her care to [facility] which is local for her.  She has had aortic arch de-branching and stenting of her aortic arch and descending thoracic aorta.  She had a type III and type IB endoleak 1 year ago.  Those are not seen on her CT scan today.  I would like for her to follow-up with a repeat CT scan in 6  months.  I am going to try to obtained operative notes from her repair.  Stroke: The patient appears to be recovering from her stroke 1 year ago.  She is on dual antiplatelet therapy.  She states that she was on Plavix prior to her stroke.  She states that her symptoms have dramatically improved.  She had carotid Dopplers at that time which showed 1-39 percent stenosis.      Eldridge Abrahams, M.D. [facility] Office: [phone] Pager:  [phone]

## 2013-12-10 NOTE — Addendum Note (Signed)
Addended by: Mena Goes on: 12/10/2013 02:01 PM   Modules accepted: Orders

## 2013-12-14 ENCOUNTER — Encounter: Payer: Self-pay | Admitting: Internal Medicine

## 2013-12-14 ENCOUNTER — Ambulatory Visit (INDEPENDENT_AMBULATORY_CARE_PROVIDER_SITE_OTHER): Payer: 59 | Admitting: Internal Medicine

## 2013-12-14 VITALS — BP 124/74 | HR 76 | Ht 69.5 in | Wt 171.0 lb

## 2013-12-14 DIAGNOSIS — R011 Cardiac murmur, unspecified: Secondary | ICD-10-CM

## 2013-12-14 DIAGNOSIS — E7800 Pure hypercholesterolemia, unspecified: Secondary | ICD-10-CM

## 2013-12-14 DIAGNOSIS — I119 Hypertensive heart disease without heart failure: Secondary | ICD-10-CM

## 2013-12-14 MED ORDER — TRIAMTERENE-HCTZ 37.5-25 MG PO TABS: 0.5000 | ORAL_TABLET | Freq: Every day | ORAL | Status: DC

## 2013-12-14 MED ORDER — AMLODIPINE BESYLATE 10 MG PO TABS: 10.0000 mg | ORAL_TABLET | Freq: Every day | ORAL | Status: DC

## 2013-12-14 NOTE — Patient Instructions (Signed)
Your physician has recommended you make the following change in your medication:  1.) increase amlodipine to 10 mg once a day 2.) start maxzide 37.5/25 mg--take 1/2 tablet once a day  Your physician recommends that you return for lab work next week. (LIPIDS, AST, BMET)  Your physician recommends that you schedule a follow-up appointment in: 4-6 weeks with Dr. Harrington Challenger.

## 2013-12-14 NOTE — Progress Notes (Signed)
HPI Patinet is a 58 yo who is referred from J Allred for continued general cardiology care. SHe has a history of suffering CVA in 2014.  Has an implanted loop recorder.  She was just seen by J Allred She also has signif vascular dz  Treated at [facility] for thoracic aneursym with stent repair and leak.  Records ordered In addition she has a hsitory of signif HTN  BP are asymmetric with left arm much less than R.  She denies CP  No SOB  No new neurologic symptoms.     No Known Allergies  Current Outpatient Prescriptions  Medication Sig Dispense Refill  . amLODipine (NORVASC) 10 MG tablet Take 1 tablet (10 mg total) by mouth daily.  180 tablet  3  . carvedilol (COREG) 3.125 MG tablet Take 3.125 mg by mouth 2 (two) times daily with a meal.      . clopidogrel (PLAVIX) 75 MG tablet Take 1 tablet (75 mg total) by mouth daily with breakfast.  30 tablet  3  . HYDROcodone-acetaminophen (NORCO/VICODIN) 5-325 MG per tablet Take 1 tablet by mouth every 6 (six) hours as needed (pain).       . InFLIXimab (REMICADE IV) Inject into the vein. Due the 28th of October    At rheumatology      . levETIRAcetam (KEPPRA) 1000 MG tablet TAKE 1 TABLET BY MOUTH TWICE A DAY  60 tablet  2  . losartan (COZAAR) 50 MG tablet TAKE 1 TABLET BY MOUTH EVERY DAY  30 tablet  2  . methotrexate 2.5 MG tablet Take 10 mg by mouth once a week.       . Multiple Vitamin (MULTIVITAMIN) tablet Take 1 tablet by mouth daily.        . predniSONE (DELTASONE) 1 MG tablet Take 0.5 mg by mouth daily with breakfast.      . simvastatin (ZOCOR) 20 MG tablet Take 20 mg by mouth every evening.      . triamterene-hydrochlorothiazide (MAXZIDE-25) 37.5-25 MG per tablet Take 0.5 tablets by mouth daily.  45 tablet  3   No current facility-administered medications for this visit.    Past Medical History  Diagnosis Date  . HYPERCHOLESTEROLEMIA 04/01/2008  . ANXIETY 10/28/2006  . ANEMIA 10/17/2009  . HYPERTENSION 10/28/2006  . THORACIC AORTIC ANEURYSM  04/01/2008  . SEIZURE DISORDER 04/01/2008  . Menopause     age 41  . Duodenitis without mention of hemorrhage 2005  . Stroke 11/2012  . ASTHMA 10/28/2006    no inhalers per pt  . Arthritis   . Blood transfusion without reported diagnosis   . Depression   . Heart murmur   . Osteoporosis     bil knees    Past Surgical History  Procedure Laterality Date  . Esophagogastroduodenoscopy  01/29/2004  . Tee without cardioversion N/A 12/01/2012    Procedure: TRANSESOPHAGEAL ECHOCARDIOGRAM (TEE);  Surgeon: Fay Records, MD;  Location: [facility] ENDOSCOPY;  Service: Cardiovascular;  Laterality: N/A;  . 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 Breast cancer Mother   . Colon cancer Neg Hx   . Esophageal cancer Neg Hx   . Pancreatic cancer Neg Hx   . Rectal cancer Neg Hx   . Stomach cancer Neg Hx     History   Social History  . Marital Status: Single    Spouse Name: N/A  Number of Children: N/A  . Years of Education: N/A   Occupational History  . Not on file.   Social History Main Topics  . Smoking status: Light Tobacco Smoker -- 0.25 packs/day    Types: Cigarettes  . Smokeless tobacco: Never Used  . Alcohol Use: No  . Drug Use: Yes    Special: Marijuana  . Sexual Activity: Not on file   Other Topics Concern  . Not on file   Social History Narrative  . No narrative on file    Review of Systems:  All systems reviewed.  They are negative to the above problem except as previously stated.  Vital Signs: BP 124/74  Pulse 76  Ht 5' 9.5" (1.765 m)  Wt 171 lb (77.565 kg)  BMI 24.90 kg/m2 BO R arm 210/90  L arm 128/90   Physical Exam Patinet is in NAD HEENT:  Normocephalic, atraumatic. EOMI, PERRLA.  Neck: JVP is normal.  No bruits.  Lungs: clear to auscultation. No rales no wheezes.  Heart: Regular rate and rhythm. Normal S1, S2. No S3.   No significant murmurs. PMI not  displaced.  Abdomen:  Supple, nontender. Normal bowel sounds. No masses. No hepatomegaly.  Extremities:   Good distal pulses throughout. No lower extremity edema.  Musculoskeletal :moving all extremities.  Neuro:   alert and oriented x3.  CN II-XII grossly intact.  EKG SR 96  LVH   Assessment and Plan: 1 Hx CVA.  Has implanted monitor.  Also follows in neuro.  2.  HTN  BP is significantly elevated in R arm    Will increase amlodipine to 10   Add Maxzide 1/2 37.5/25 F/u in a few wks    3.  Thoracic aneurysm.  Patient has been followed at [facility]  Hx stent graft with leak.  Recently seen by Pierre Bali  Will f/u with him in long term.    4.  HL  WIll need to follow lipids  5.  Tob  Patient says she is smoking 5 cigs/wk  She smells like she smokes more.  Councelled on quitting.

## 2013-12-14 NOTE — Progress Notes (Signed)
Loop recorder 

## 2013-12-18 ENCOUNTER — Other Ambulatory Visit: Payer: 59

## 2013-12-20 ENCOUNTER — Other Ambulatory Visit: Payer: Self-pay | Admitting: *Deleted

## 2013-12-20 ENCOUNTER — Encounter: Payer: Self-pay | Admitting: Internal Medicine

## 2013-12-20 ENCOUNTER — Other Ambulatory Visit (INDEPENDENT_AMBULATORY_CARE_PROVIDER_SITE_OTHER): Payer: 59 | Admitting: *Deleted

## 2013-12-20 DIAGNOSIS — E7800 Pure hypercholesterolemia, unspecified: Secondary | ICD-10-CM

## 2013-12-20 DIAGNOSIS — Z79899 Other long term (current) drug therapy: Secondary | ICD-10-CM

## 2013-12-20 DIAGNOSIS — I10 Essential (primary) hypertension: Secondary | ICD-10-CM

## 2013-12-20 LAB — LIPID PANEL
Cholesterol: 175 mg/dL (ref 0–200)
HDL: 58.7 mg/dL (ref >39.00)
LDL Cholesterol: 96 mg/dL (ref 0–99)
NONHDL: 116.3
Total CHOL/HDL Ratio: 3
Triglycerides: 101 mg/dL (ref 0.0–149.0)
VLDL: 20.2 mg/dL (ref 0.0–40.0)

## 2013-12-20 LAB — AST: AST: 22 U/L (ref 0–37)

## 2013-12-26 ENCOUNTER — Telehealth: Payer: Self-pay | Admitting: *Deleted

## 2013-12-26 DIAGNOSIS — E785 Hyperlipidemia, unspecified: Secondary | ICD-10-CM

## 2013-12-26 MED ORDER — ATORVASTATIN CALCIUM 40 MG PO TABS: 40.0000 mg | ORAL_TABLET | Freq: Every day | ORAL | Status: DC

## 2013-12-26 NOTE — Telephone Encounter (Signed)
Informed patient.  Verbalizes understanding. She will stop Zocor after next week. Sent Lipitor to pharmacy. Lab orders placed.

## 2013-12-26 NOTE — Telephone Encounter (Signed)
-----   Message from Russell, MD sent at 12/21/2013  3:00 PM EST ----- LDL has been better in past.  Now 96   I would like her to finish simvistatin. I would then switch to lipitor 40 to see if can get tighter control Check lipids and AST in 10 wks.

## 2014-01-02 ENCOUNTER — Ambulatory Visit (INDEPENDENT_AMBULATORY_CARE_PROVIDER_SITE_OTHER): Payer: 59 | Admitting: *Deleted

## 2014-01-02 ENCOUNTER — Encounter: Payer: Self-pay | Admitting: Neurology

## 2014-01-02 DIAGNOSIS — I639 Cerebral infarction, unspecified: Secondary | ICD-10-CM

## 2014-01-04 LAB — MDC_IDC_ENUM_SESS_TYPE_REMOTE

## 2014-01-08 NOTE — Progress Notes (Signed)
Loop recorder 

## 2014-01-09 ENCOUNTER — Other Ambulatory Visit: Payer: Self-pay | Admitting: Neurology

## 2014-01-21 ENCOUNTER — Encounter: Payer: Self-pay | Admitting: Internal Medicine

## 2014-01-21 NOTE — Progress Notes (Signed)
HPI Patinet is a 58 yo who is referred from [facility] for continued general cardiology care. SHe has a history of suffering CVA in 2014.  Has an implanted loop recorder.  She was just seen by J Allred She also has signif vascular dz  Treated at [facility] for thoracic aneursym with stent repair and leak.  Records ordered In addition she has a hsitory of signif HTN  BP are asymmetric with left arm much less than R.  I saw the patient in Oct 2015 Patient denies CP  No SOB  No palpitations    No Known Allergies  Current Outpatient Prescriptions  Medication Sig Dispense Refill  . amLODipine (NORVASC) 10 MG tablet Take 1 tablet (10 mg total) by mouth daily. 180 tablet 3  . atorvastatin (LIPITOR) 40 MG tablet Take 1 tablet (40 mg total) by mouth daily. 90 tablet 3  . carvedilol (COREG) 3.125 MG tablet Take 3.125 mg by mouth 2 (two) times daily with a meal.    . clopidogrel (PLAVIX) 75 MG tablet TAKE 1 TABLET BY MOUTH DAILY WITH BREAKFAST 90 tablet 0  . InFLIXimab (REMICADE IV) Inject into the vein. Due the 28th of October    At rheumatology    . levETIRAcetam (KEPPRA) 1000 MG tablet TAKE 1 TABLET BY MOUTH TWICE A DAY 60 tablet 2  . losartan (COZAAR) 50 MG tablet TAKE 1 TABLET BY MOUTH EVERY DAY 30 tablet 2  . methotrexate 2.5 MG tablet Take 10 mg by mouth once a week.     . Multiple Vitamin (MULTIVITAMIN) tablet Take 1 tablet by mouth daily.      . predniSONE (DELTASONE) 1 MG tablet Take 0.5 mg by mouth daily with breakfast.    . predniSONE (DELTASONE) 5 MG tablet Take by mouth daily.  2  . triamterene-hydrochlorothiazide (MAXZIDE-25) 37.5-25 MG per tablet Take 0.5 tablets by mouth daily. 45 tablet 3  . HYDROcodone-acetaminophen (NORCO/VICODIN) 5-325 MG per tablet Take 1 tablet by mouth every 6 (six) hours as needed (pain).      No current facility-administered medications for this visit.    Past Medical History  Diagnosis Date  . HYPERCHOLESTEROLEMIA 04/01/2008  . ANXIETY 10/28/2006  . ANEMIA  10/17/2009  . HYPERTENSION 10/28/2006  . 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 SEIZURE DISORDER 04/01/2008  . Menopause     age 55  . Duodenitis without mention of hemorrhage 2005  . Stroke 11/2012  . ASTHMA 10/28/2006    no inhalers per pt  . Arthritis   . Blood transfusion without reported diagnosis   . Depression   . Heart murmur   . Osteoporosis     bil knees    Past Surgical History  Procedure Laterality Date  . Esophagogastroduodenoscopy  01/29/2004  . Tee without cardioversion N/A 12/01/2012    Procedure: TRANSESOPHAGEAL ECHOCARDIOGRAM (TEE);  Surgeon: Fay Records, MD;  Location: [facility] ENDOSCOPY;  Service: Cardiovascular;  Laterality: N/A;  . 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 Breast cancer Mother   . Colon cancer Neg Hx   . Esophageal cancer Neg Hx   . Pancreatic cancer Neg Hx   . Rectal cancer Neg Hx   . Stomach cancer Neg Hx     History   Social History  . Marital Status: Single    Spouse Name: N/A    Number of Children: N/A  .  Years of Education: N/A   Occupational History  . Not on file.   Social History Main Topics  . Smoking status: Light Tobacco Smoker -- 0.25 packs/day    Types: Cigarettes  . Smokeless tobacco: Never Used  . Alcohol Use: No  . Drug Use: Yes    Special: Marijuana  . Sexual Activity: Not on file   Other Topics Concern  . Not on file   Social History Narrative    Review of Systems:  All systems reviewed.  They are negative to the above problem except as previously stated.  Vital Signs: BP 160/60 mmHg  Pulse 86  Ht 5\' 9"  (1.753 m)  Wt 171 lb (77.565 kg)  BMI 25.24 kg/m2 BO R arm 210/90  L arm 128/90   Physical Exam Patinet is in NAD HEENT:  Normocephalic, atraumatic. EOMI, PERRLA.  Neck: JVP is normal.  No bruits.  Lungs: clear to auscultation. No rales no wheezes.  Heart: Regular rate and rhythm. Normal S1, S2.  No S3.   No significant murmurs. PMI not displaced.  Abdomen:  Supple, nontender. Normal bowel sounds. No masses. No hepatomegaly.  Extremities:   Good distal pulses throughout. No lower extremity edema.  Musculoskeletal :moving all extremities.  Neuro:   alert and oriented x3.  CN II-XII grossly intact.  EKG SR 96  LVH   Assessment and Plan: 1 Hx CVA.  Has implanted monitor.  Also follows in neuro.  2.  HTN   3.  Thoracic aneurysm.  Patient has been followed at [facility]  Hx stent graft with leak.  Recently seen by Pierre Bali  Will f/u with him in long term.    4.  HL   5.  Tob Using vapes  Says it has helped her cut back

## 2014-01-22 ENCOUNTER — Encounter: Payer: Self-pay | Admitting: Internal Medicine

## 2014-01-22 ENCOUNTER — Ambulatory Visit (INDEPENDENT_AMBULATORY_CARE_PROVIDER_SITE_OTHER): Payer: 59 | Admitting: Internal Medicine

## 2014-01-22 VITALS — BP 160/60 | HR 86 | Ht 69.0 in | Wt 171.0 lb

## 2014-01-22 DIAGNOSIS — I10 Essential (primary) hypertension: Secondary | ICD-10-CM

## 2014-01-22 DIAGNOSIS — I719 Aortic aneurysm of unspecified site, without rupture: Secondary | ICD-10-CM

## 2014-01-22 NOTE — Patient Instructions (Signed)
Your physician recommends that you continue on your current medications as directed. Please refer to the Current Medication list given to you today.  Your physician recommends that you schedule a follow-up appointment in: Dr Harrington Challenger will decide

## 2014-01-24 ENCOUNTER — Encounter (HOSPITAL_COMMUNITY): Payer: Self-pay | Admitting: Internal Medicine

## 2014-01-25 ENCOUNTER — Ambulatory Visit: Payer: 59 | Admitting: Internal Medicine

## 2014-02-01 ENCOUNTER — Ambulatory Visit (INDEPENDENT_AMBULATORY_CARE_PROVIDER_SITE_OTHER): Payer: 59 | Admitting: *Deleted

## 2014-02-01 DIAGNOSIS — I639 Cerebral infarction, unspecified: Secondary | ICD-10-CM

## 2014-02-01 LAB — MDC_IDC_ENUM_SESS_TYPE_REMOTE

## 2014-02-05 NOTE — Progress Notes (Signed)
Loop recorder 

## 2014-03-03 ENCOUNTER — Other Ambulatory Visit: Payer: Self-pay | Admitting: Endocrinology

## 2014-03-04 ENCOUNTER — Ambulatory Visit (INDEPENDENT_AMBULATORY_CARE_PROVIDER_SITE_OTHER): Payer: BLUE CROSS/BLUE SHIELD | Admitting: *Deleted

## 2014-03-04 DIAGNOSIS — I639 Cerebral infarction, unspecified: Secondary | ICD-10-CM

## 2014-03-05 NOTE — Progress Notes (Signed)
Loop recorder 

## 2014-03-11 ENCOUNTER — Other Ambulatory Visit: Payer: 59

## 2014-03-14 ENCOUNTER — Encounter: Payer: Self-pay | Admitting: Internal Medicine

## 2014-03-22 ENCOUNTER — Ambulatory Visit: Payer: 59 | Admitting: Nurse Practitioner

## 2014-03-26 ENCOUNTER — Telehealth: Payer: Self-pay

## 2014-03-26 LAB — MDC_IDC_ENUM_SESS_TYPE_REMOTE

## 2014-03-26 NOTE — Telephone Encounter (Signed)
Spoke to patient. R/s appt.

## 2014-04-03 ENCOUNTER — Ambulatory Visit (INDEPENDENT_AMBULATORY_CARE_PROVIDER_SITE_OTHER): Payer: BLUE CROSS/BLUE SHIELD | Admitting: *Deleted

## 2014-04-03 DIAGNOSIS — I639 Cerebral infarction, unspecified: Secondary | ICD-10-CM | POA: Diagnosis not present

## 2014-04-04 ENCOUNTER — Ambulatory Visit: Payer: 59 | Admitting: Nurse Practitioner

## 2014-04-05 NOTE — Progress Notes (Signed)
Loop recorder 

## 2014-04-11 ENCOUNTER — Encounter: Payer: Self-pay | Admitting: Internal Medicine

## 2014-04-16 ENCOUNTER — Ambulatory Visit (INDEPENDENT_AMBULATORY_CARE_PROVIDER_SITE_OTHER): Payer: BLUE CROSS/BLUE SHIELD | Admitting: Nurse Practitioner

## 2014-04-16 ENCOUNTER — Encounter: Payer: Self-pay | Admitting: Nurse Practitioner

## 2014-04-16 VITALS — BP 175/78 | HR 89 | Ht 69.0 in | Wt 181.6 lb

## 2014-04-16 DIAGNOSIS — I639 Cerebral infarction, unspecified: Secondary | ICD-10-CM

## 2014-04-16 LAB — MDC_IDC_ENUM_SESS_TYPE_REMOTE

## 2014-04-16 MED ORDER — CLOPIDOGREL BISULFATE 75 MG PO TABS: 75.0000 mg | ORAL_TABLET | Freq: Every day | ORAL | Status: DC

## 2014-04-16 NOTE — Patient Instructions (Addendum)
Continue Keppra at current dose, refilled by Dr. Loanne Drilling Continue Plavix at current dose will refill Set up for carotid doppler  F/U in 6 months

## 2014-04-16 NOTE — Progress Notes (Signed)
[facility]  PATIENT: Diana Walters DOB: 11-Aug-1955   REASON FOR VISIT: Follow-up for history of stroke, follow-up for seizures HISTORY FROM: Patient  HISTORY OF PRESENT ILLNESS: Diana Walters, 59 year old female returns for follow-up. She has a history of stroke in 2014 when she had drooling on the left side of her mouth with dysarthria. Her dysarthria has resolved completely. She remains on Plavix for secondary stroke prevention. She is due for a carotid Doppler. Last visit in this office 09/19/2013 with Diana Walters nurse practitioner. She denies any repeat stroke symptoms. Her blood pressure is 175/78. She has not taken her hypertensive medications today. She has a LINQ loop recorder which has not detected atrial fibrillation to date. She states she quit smoking last year. She is due for repeat lipid profile at PCP. She also has a history of remote seizures and is on Keppra. She has not had any seizure activity in 2 years. The last time she had seizure she had run out of her medication. She returns for reevaluation.  12/29/12 (PS): She was admitted on 11/29/12 and when she woke up for drooling of the left side of her mouth and dysarthria. CT scan of the head showed right frontal opercular hypodensity an MRI scan confirmed acute nonhemorrhagic infarct involving the right insular cortex and operculum as well as multiple remote lacunar infarcts involving left pons and right cerebellum. There also multiple punctate areas of microhemorrhages scattered throughout the brain due to small vessel disease. Transthoracic echo showed normal ejection fraction. TEE showed a small PFO but no clot. Total cholesterol was 184, triglycerides 60, HDL 74 and LDL 97 mg percent. Hemoglobin and 1C was 5.8%. Urine drug screen was positive for marijuana. CT angiogram of the chest showed old para thoracic aorta with stent and graft but no endovascular leak. Carotid Doppler showed 1-39% bilateral ICA stenosis.  Left vertebral artery flow is retrograde. She had remote history of seizures which was stable on Keppra. She is advised to continue Plavix because of her peripheral vascular disease and maintain strict control of hypertension and hyperlipidemia. She states she has done well since discharge and has not had stroke symptoms. She states her blood pressure is under good control though she forgot to take her medication this morning and her blood pressure was significantly elevated in office today at 210/93.  REVIEW OF SYSTEMS: Full 14 system review of systems performed and notable only for those listed, all others are neg:  Constitutional: neg  Cardiovascular: neg Ear/Nose/Throat: neg  Skin: neg Eyes: neg Respiratory: neg Gastroitestinal: neg  Hematology/Lymphatic: neg  Endocrine: neg Musculoskeletal:neg Allergy/Immunology: neg Neurological: neg Psychiatric: neg Sleep : neg   ALLERGIES: No Known Allergies  HOME MEDICATIONS: Outpatient Prescriptions Prior to Visit  Medication Sig Dispense Refill  . amLODipine (NORVASC) 10 MG tablet Take 1 tablet (10 mg total) by mouth daily. 180 tablet 3  . atorvastatin (LIPITOR) 40 MG tablet Take 1 tablet (40 mg total) by mouth daily. 90 tablet 3  . carvedilol (COREG) 3.125 MG tablet Take 3.125 mg by mouth 2 (two) times daily with a meal.    . clopidogrel (PLAVIX) 75 MG tablet TAKE 1 TABLET BY MOUTH DAILY WITH BREAKFAST 90 tablet 0  . HYDROcodone-acetaminophen (NORCO/VICODIN) 5-325 MG per tablet Take 1 tablet by mouth every 6 (six) hours as needed (pain).     . InFLIXimab (REMICADE IV) Inject into the vein. Due the 28th of October    At rheumatology    . levETIRAcetam (KEPPRA)  1000 MG tablet TAKE 1 TABLET BY MOUTH TWICE A DAY 60 tablet 2  . losartan (COZAAR) 50 MG tablet TAKE 1 TABLET BY MOUTH EVERY DAY 30 tablet 2  . methotrexate 2.5 MG tablet Take 10 mg by mouth once a week.     . Multiple Vitamin (MULTIVITAMIN) tablet Take 1 tablet by mouth daily.        . predniSONE (DELTASONE) 1 MG tablet Take 0.5 mg by mouth daily with breakfast.    . triamterene-hydrochlorothiazide (MAXZIDE-25) 37.5-25 MG per tablet Take 0.5 tablets by mouth daily. 45 tablet 3  . predniSONE (DELTASONE) 5 MG tablet Take by mouth daily.  2   No facility-administered medications prior to visit.    PAST MEDICAL HISTORY: Past Medical History  Diagnosis Date  . HYPERCHOLESTEROLEMIA 04/01/2008  . ANXIETY 10/28/2006  . ANEMIA 10/17/2009  . HYPERTENSION 10/28/2006  . 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 SEIZURE DISORDER 04/01/2008  . Menopause     age 13  . Duodenitis without mention of hemorrhage 2005  . Stroke 11/2012  . ASTHMA 10/28/2006    no inhalers per pt  . Arthritis   . Blood transfusion without reported diagnosis   . Depression   . Heart murmur   . Osteoporosis     bil knees    PAST SURGICAL HISTORY: Past Surgical History  Procedure Laterality Date  . Esophagogastroduodenoscopy  01/29/2004  . Tee without cardioversion N/A 12/01/2012    Procedure: TRANSESOPHAGEAL ECHOCARDIOGRAM (TEE);  Surgeon: Diana Records, MD;  Location: [facility] ENDOSCOPY;  Service: Cardiovascular;  Laterality: N/A;  . Loop recorder implant  12-01-2012    MDT LinQ implanted by Dr Diana Walters for cyrptogenic stroke  . Loop recorder implant N/A 12/01/2012    Procedure: LOOP RECORDER IMPLANT;  Surgeon: Diana Mark, MD;  Location: [facility] CATH LAB;  Service: Cardiovascular;  Laterality: N/A;    FAMILY HISTORY: Family History  Problem Relation Age of Onset  . Breast cancer Mother   . Colon cancer Neg Hx   . Esophageal cancer Neg Hx   . Pancreatic cancer Neg Hx   . Rectal cancer Neg Hx   . Stomach cancer Neg Hx     SOCIAL HISTORY: History   Social History  . Marital Status: Single    Spouse Name: N/A  . Number of Children: N/A  . Years of Education: N/A   Occupational History  . Not on file.   Social History Main Topics  . Smoking status: Light Tobacco Smoker -- 0.25 packs/day     Types: Cigarettes  . Smokeless tobacco: Never Used  . Alcohol Use: No  . Drug Use: Yes    Special: Marijuana  . Sexual Activity: Not on file   Other Topics Concern  . Not on file   Social History Narrative     PHYSICAL EXAM  Filed Vitals:   04/16/14 0827  BP: 175/78  Pulse: 89  Height: 5\' 9"  (1.753 m)  Weight: 181 lb 9.6 oz (82.373 kg)   Body mass index is 26.81 kg/(m^2). General: Frail middle-aged African American lady, seated, in no evident distress Head: head normocephalic and atraumatic. Orohparynx benign Neck: supple with no carotid or supraclavicular bruits Cardiovascular: regular rate and rhythm, 4/6 systolic murmur Musculoskeletal: no deformity Skin: no rash/petichiae  Neurologic Exam Mental Status: Awake and fully alert. Oriented to place and time. Recent and remote memory intact. Attention span, concentration and fund of knowledge appropriate. Mood and affect appropriate.  Cranial Nerves: Fundoscopic exam not done.  Pupils equal, briskly reactive to light. Extraocular movements full without nystagmus. Visual fields full to confrontation. Hearing intact. Facial sensation intact. Face, tongue, palate moves normally and symmetrically.  Motor: Normal bulk and tone. Normal strength in all tested extremity muscles. No focal weakness Sensory: intact to light touch in all 4 extremities Coordination: Rapid alternating movements normal in all extremities. Finger-to-nose and heel-to-shin performed accurately bilaterally. Gait and Station: Arises from chair without difficulty. Stance is normal. Gait demonstrates normal stride length and balance . Able to heel, toe and tandem walk without difficulty. No assistive device Reflexes: 1+ and symmetric. Toes downgoing.   DIAGNOSTIC DATA (LABS, IMAGING, TESTING) - I reviewed patient Walters, labs, notes, testing and imaging myself where available.    Lab Results  Component Value Date   CHOL 175 12/20/2013   HDL 58.70  12/20/2013   LDLCALC 96 12/20/2013   LDLDIRECT 74.3 11/27/2010   TRIG 101.0 12/20/2013   CHOLHDL 3 12/20/2013      ASSESSMENT AND PLAN  59 y.o. year old female  has a past medical history of right frontal MCA branch infarct in October 2641 of embolic etiology without identified source likely cryptogenic. Vascular risk factors of hypertension, hyperlipidemia, former smoker and PVD. Patient has no residual deficits at this time  Continue Keppra at current dose, refilled by Dr. Loanne Walters Continue Plavix at current dose will refill Please make sure to take her blood pressure medications daily as directed blood pressure elevated in the office today 175/78 Reviewed lipid  lipid profile from 11/15. , keep LDL less than 100 total cholesterol less than 200 Continue to exercise regularly maintain healthy body weight  Set up for carotid doppler  F/U in 6 months Diana Walters, [facility], [facility], [facility] [address], [place], [place] [address] [phone]

## 2014-04-17 NOTE — Progress Notes (Signed)
I agree with the above plan 

## 2014-05-02 ENCOUNTER — Encounter: Payer: Self-pay | Admitting: Internal Medicine

## 2014-05-02 ENCOUNTER — Ambulatory Visit (INDEPENDENT_AMBULATORY_CARE_PROVIDER_SITE_OTHER): Payer: BLUE CROSS/BLUE SHIELD

## 2014-05-02 DIAGNOSIS — I639 Cerebral infarction, unspecified: Secondary | ICD-10-CM | POA: Diagnosis not present

## 2014-05-03 ENCOUNTER — Ambulatory Visit (INDEPENDENT_AMBULATORY_CARE_PROVIDER_SITE_OTHER): Payer: BLUE CROSS/BLUE SHIELD | Admitting: *Deleted

## 2014-05-03 DIAGNOSIS — I639 Cerebral infarction, unspecified: Secondary | ICD-10-CM

## 2014-05-10 NOTE — Progress Notes (Signed)
Loop recorder 

## 2014-05-14 ENCOUNTER — Telehealth: Payer: Self-pay | Admitting: Nurse Practitioner

## 2014-05-14 NOTE — Telephone Encounter (Signed)
Carotid doppler negative for significant stenosis . Please call the patient

## 2014-05-14 NOTE — Telephone Encounter (Signed)
Called patient to gave results. Patient verbalized understanding.

## 2014-05-15 LAB — MDC_IDC_ENUM_SESS_TYPE_REMOTE

## 2014-05-24 ENCOUNTER — Encounter: Payer: Self-pay | Admitting: Internal Medicine

## 2014-06-03 ENCOUNTER — Ambulatory Visit (INDEPENDENT_AMBULATORY_CARE_PROVIDER_SITE_OTHER): Payer: BLUE CROSS/BLUE SHIELD | Admitting: *Deleted

## 2014-06-03 DIAGNOSIS — I639 Cerebral infarction, unspecified: Secondary | ICD-10-CM

## 2014-06-05 NOTE — Progress Notes (Signed)
Loop recorder 

## 2014-06-10 ENCOUNTER — Ambulatory Visit
Admission: RE | Admit: 2014-06-10 | Discharge: 2014-06-10 | Disposition: A | Discharge status: Home / Self Care | Payer: BLUE CROSS/BLUE SHIELD | Source: Ambulatory Visit | Attending: Surgery | Admitting: Surgery

## 2014-06-10 ENCOUNTER — Other Ambulatory Visit: Payer: Self-pay | Admitting: Surgery

## 2014-06-10 ENCOUNTER — Ambulatory Visit: Payer: 59 | Admitting: Surgery

## 2014-06-10 DIAGNOSIS — I712 Thoracic aortic aneurysm, without rupture: Secondary | ICD-10-CM

## 2014-06-10 DIAGNOSIS — IMO0001 Reserved for inherently not codable concepts without codable children: Secondary | ICD-10-CM

## 2014-06-10 DIAGNOSIS — I7122 Aneurysm of the aortic arch, without rupture: Secondary | ICD-10-CM

## 2014-06-10 DIAGNOSIS — T82330D Leakage of aortic (bifurcation) graft (replacement), subsequent encounter: Secondary | ICD-10-CM

## 2014-06-10 LAB — CREATININE, SERUM: Creat: 1.1 mg/dL (ref 0.50–1.10)

## 2014-06-10 LAB — BUN: BUN: 14 mg/dL (ref 6–23)

## 2014-06-10 MED ORDER — IOPAMIDOL (ISOVUE-370) INJECTION 76%
75.0000 mL | Freq: Once | INTRAVENOUS | Status: AC | PRN
  Administered 2014-06-10: 75 mL via INTRAVENOUS

## 2014-06-14 ENCOUNTER — Encounter: Payer: Self-pay | Admitting: Surgery

## 2014-06-17 ENCOUNTER — Ambulatory Visit: Payer: Self-pay | Admitting: Surgery

## 2014-06-21 LAB — CUP PACEART REMOTE DEVICE CHECK: MDC IDC SESS DTM: 20160506141530

## 2014-06-28 ENCOUNTER — Encounter: Payer: Self-pay | Admitting: Surgery

## 2014-06-28 ENCOUNTER — Encounter: Payer: Self-pay | Admitting: Internal Medicine

## 2014-07-01 ENCOUNTER — Ambulatory Visit: Payer: Self-pay | Admitting: Surgery

## 2014-07-03 ENCOUNTER — Ambulatory Visit (INDEPENDENT_AMBULATORY_CARE_PROVIDER_SITE_OTHER): Payer: BLUE CROSS/BLUE SHIELD | Admitting: *Deleted

## 2014-07-03 DIAGNOSIS — I639 Cerebral infarction, unspecified: Secondary | ICD-10-CM

## 2014-07-05 NOTE — Progress Notes (Signed)
Loop recorder 

## 2014-07-08 DIAGNOSIS — Z0279 Encounter for issue of other medical certificate: Secondary | ICD-10-CM

## 2014-07-17 LAB — CUP PACEART REMOTE DEVICE CHECK: MDC IDC SESS DTM: 20160601155214

## 2014-07-24 ENCOUNTER — Encounter: Payer: Self-pay | Admitting: Surgery

## 2014-07-29 ENCOUNTER — Ambulatory Visit: Payer: Self-pay | Admitting: Surgery

## 2014-07-30 ENCOUNTER — Encounter: Payer: Self-pay | Admitting: Surgery

## 2014-07-31 ENCOUNTER — Encounter: Payer: Self-pay | Admitting: Internal Medicine

## 2014-08-02 ENCOUNTER — Encounter: Payer: Self-pay | Admitting: Internal Medicine

## 2014-08-02 ENCOUNTER — Ambulatory Visit (INDEPENDENT_AMBULATORY_CARE_PROVIDER_SITE_OTHER): Payer: BLUE CROSS/BLUE SHIELD | Admitting: *Deleted

## 2014-08-02 DIAGNOSIS — I639 Cerebral infarction, unspecified: Secondary | ICD-10-CM

## 2014-08-05 ENCOUNTER — Encounter: Payer: Self-pay | Admitting: Surgery

## 2014-08-05 ENCOUNTER — Ambulatory Visit (INDEPENDENT_AMBULATORY_CARE_PROVIDER_SITE_OTHER): Payer: BLUE CROSS/BLUE SHIELD | Admitting: Surgery

## 2014-08-05 VITALS — BP 168/77 | HR 72 | Temp 97.9°F | Resp 20 | Ht 69.0 in | Wt 180.0 lb

## 2014-08-05 DIAGNOSIS — IMO0001 Reserved for inherently not codable concepts without codable children: Secondary | ICD-10-CM

## 2014-08-05 DIAGNOSIS — T82330D Leakage of aortic (bifurcation) graft (replacement), subsequent encounter: Secondary | ICD-10-CM

## 2014-08-05 NOTE — Progress Notes (Signed)
Loop recorder 

## 2014-08-05 NOTE — Addendum Note (Signed)
Addended by: Mena Goes on: 08/05/2014 11:49 AM   Modules accepted: Orders

## 2014-08-05 NOTE — Progress Notes (Signed)
Patient name: Diana Walters MRN: [id number] DOB: [birth year] Sex: female     Chief Complaint  Patient presents with  . TAA    Follow up on TAA  had CTA don 06-10-14.     HISTORY OF PRESENT ILLNESS: This is a very pleasant 59 year old female who comes in today for transfer of care regarding her thoracic aortic aneurysm. The patient reports that this was repaired at [facility] last year. This appears to be a hybrid repair with aortic arch de-branching and endovascular stenting. 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 She was transferred to [facility] for further workup. The patient states that no intervention was performed.  She had a CT scan performed in April but was absent for several clinic visits.  She was able to come in today.  She reports feeling well without complaints.  Past Medical History  Diagnosis Date  . HYPERCHOLESTEROLEMIA 04/01/2008  . ANXIETY 10/28/2006  . ANEMIA 10/17/2009  . HYPERTENSION 10/28/2006  . 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 SEIZURE DISORDER 04/01/2008  . Menopause     age 62  . Duodenitis without mention of hemorrhage 2005  . Stroke 11/2012  . ASTHMA 10/28/2006    no inhalers per pt  . Arthritis   . Blood transfusion without reported diagnosis   . Depression   . Heart murmur   . Osteoporosis     bil knees    Past Surgical History  Procedure Laterality Date  . Esophagogastroduodenoscopy  01/29/2004  . Tee without cardioversion N/A 12/01/2012    Procedure: TRANSESOPHAGEAL ECHOCARDIOGRAM (TEE);  Surgeon: Fay Records, MD;  Location: [facility] ENDOSCOPY;  Service: Cardiovascular;  Laterality: N/A;  . Loop recorder implant  12-01-2012    MDT LinQ implanted by Dr Rayann Heman for cyrptogenic stroke  . Loop recorder implant N/A 12/01/2012    Procedure: LOOP RECORDER IMPLANT;  Surgeon: Coralyn Mark, MD;  Location: [facility] CATH LAB;  Service: Cardiovascular;  Laterality: N/A;    History   Social History  . Marital Status: Single    Spouse  Name: N/A  . Number of Children: N/A  . Years of Education: N/A   Occupational History  . Not on file.   Social History Main Topics  . Smoking status: Current Every Day Smoker -- 0.10 packs/day    Types: Cigarettes  . Smokeless tobacco: Never Used  . Alcohol Use: No  . Drug Use: Yes    Special: Marijuana  . Sexual Activity: Not on file   Other Topics Concern  . Not on file   Social History Narrative    Family History  Problem Relation Age of Onset  . Breast cancer Mother   . Colon cancer Neg Hx   . Esophageal cancer Neg Hx   . Pancreatic cancer Neg Hx   . Rectal cancer Neg Hx   . Stomach cancer Neg Hx     Allergies as of 08/05/2014  . (No Known Allergies)    Current Outpatient Prescriptions on File Prior to Visit  Medication Sig Dispense Refill  . amLODipine (NORVASC) 10 MG tablet Take 1 tablet (10 mg total) by mouth daily. 180 tablet 3  . atorvastatin (LIPITOR) 40 MG tablet Take 1 tablet (40 mg total) by mouth daily. 90 tablet 3  . carvedilol (COREG) 3.125 MG tablet Take 3.125 mg by mouth 2 (two) times daily with a meal.    . clopidogrel (PLAVIX) 75 MG tablet Take 1 tablet (  75 mg total) by mouth daily with breakfast. 90 tablet 3  . HYDROcodone-acetaminophen (NORCO/VICODIN) 5-325 MG per tablet Take 1 tablet by mouth every 6 (six) hours as needed (pain).     . InFLIXimab (REMICADE IV) Inject into the vein. Due the 28th of October    At rheumatology    . levETIRAcetam (KEPPRA) 1000 MG tablet TAKE 1 TABLET BY MOUTH TWICE A DAY 60 tablet 2  . losartan (COZAAR) 50 MG tablet TAKE 1 TABLET BY MOUTH EVERY DAY 30 tablet 2  . methotrexate 2.5 MG tablet Take 10 mg by mouth once a week.     . Multiple Vitamin (MULTIVITAMIN) tablet Take 1 tablet by mouth daily.      . predniSONE (DELTASONE) 1 MG tablet Take 0.5 mg by mouth daily with breakfast.    . triamterene-hydrochlorothiazide (MAXZIDE-25) 37.5-25 MG per tablet Take 0.5 tablets by mouth daily. 45 tablet 3   No current  facility-administered medications on file prior to visit.     REVIEW OF SYSTEMS: Cardiovascular: No chest pain, chest pressure, palpitations, orthopnea, or dyspnea on exertion. No claudication or rest pain,  No history of DVT or phlebitis. Pulmonary: No productive cough, asthma or wheezing. Neurologic: No weakness, paresthesias, aphasia, or amaurosis. No dizziness. Hematologic: No bleeding problems or clotting disorders. Musculoskeletal: No joint pain or joint swelling. Gastrointestinal: No blood in stool or hematemesis Genitourinary: No dysuria or hematuria. Psychiatric:: No history of major depression. Integumentary: No rashes or ulcers. Constitutional: No fever or chills.  PHYSICAL EXAMINATION:   Vital signs are  Filed Vitals:   08/05/14 0943  BP: 168/77  Pulse: 72  Temp: 97.9 F (36.6 C)  TempSrc: Oral  Resp: 20  Height: 5\' 9"  (1.753 m)  Weight: 180 lb (81.647 kg)  SpO2: 100%   Body mass index is 26.57 kg/(m^2). General: The patient appears their stated age. HEENT:  No gross abnormalities Pulmonary:  Non labored breathing Musculoskeletal: There are no major deformities. Neurologic: No focal weakness or paresthesias are detected, Skin: There are no ulcer or rashes noted. Psychiatric: The patient has normal affect. Cardiovascular: There is a regular rate and rhythm without significant murmur appreciated.   Diagnostic Studies I have reviewed her CT angiogram.  The aneurysm has not significantly changed in size.  There does appear to be a new type I endoleak which measures 6 x 6 mm.  Assessment: Status post arch de-branching and thoracic stent graft performed [facility] Plan: The patient's most recent CT scan shows a type IA endoleak.  From her reports at [facility] she had also had a type III and a 1B endoleak which are not seen today.  I discussed these findings with the patient.  There does not appear to be any change in the size of her aneurysm.  The patient is very tearful  today upon hearing this news.  I stressed to her that I would not recommend any intervention at this time as this is a very small finding.  It is also possible that is artifact, however most likely it is indeed a type IA endoleak.  I have recommended follow-up in November with a repeat CT scan to see if this has resolved on its own or if there has been any change in the aneurysm sac.  She would potentially be a candidate for percutaneous or transthoracic injection of this area, should it need to be repaired.  Eldridge Abrahams, M.D. [facility] Office: [phone] Pager:  [phone]

## 2014-08-13 ENCOUNTER — Other Ambulatory Visit: Payer: Self-pay | Admitting: *Deleted

## 2014-08-22 ENCOUNTER — Other Ambulatory Visit: Payer: Self-pay | Admitting: *Deleted

## 2014-08-22 DIAGNOSIS — I714 Abdominal aortic aneurysm, without rupture, unspecified: Secondary | ICD-10-CM

## 2014-09-02 ENCOUNTER — Ambulatory Visit (INDEPENDENT_AMBULATORY_CARE_PROVIDER_SITE_OTHER): Payer: BLUE CROSS/BLUE SHIELD | Admitting: *Deleted

## 2014-09-02 DIAGNOSIS — I639 Cerebral infarction, unspecified: Secondary | ICD-10-CM

## 2014-09-02 LAB — CUP PACEART REMOTE DEVICE CHECK: Date Time Interrogation Session: 20160808113341

## 2014-09-03 NOTE — Progress Notes (Signed)
Loop recorder 

## 2014-09-19 ENCOUNTER — Telehealth: Payer: Self-pay | Admitting: *Deleted

## 2014-09-19 NOTE — Telephone Encounter (Signed)
-----   Message from Thompson Grayer, MD sent at 09/15/2014  6:47 PM EDT ----- Remote device check reviewed.  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

## 2014-09-19 NOTE — Telephone Encounter (Signed)
Called patient about brady episodes- denies dizziness or passing out. She reports that she is likely sleeping between the hours of 9pm and 7am. Advised to call if she experiences any lightheadedness, she verbalizes understanding.

## 2014-10-01 ENCOUNTER — Ambulatory Visit (INDEPENDENT_AMBULATORY_CARE_PROVIDER_SITE_OTHER): Payer: Self-pay | Admitting: *Deleted

## 2014-10-01 DIAGNOSIS — I639 Cerebral infarction, unspecified: Secondary | ICD-10-CM

## 2014-10-01 NOTE — Progress Notes (Signed)
Loop recorder 

## 2014-10-02 ENCOUNTER — Encounter: Payer: Self-pay | Admitting: Internal Medicine

## 2014-10-14 LAB — CUP PACEART REMOTE DEVICE CHECK: Date Time Interrogation Session: 20160829113139

## 2014-10-17 ENCOUNTER — Ambulatory Visit (INDEPENDENT_AMBULATORY_CARE_PROVIDER_SITE_OTHER): Payer: BLUE CROSS/BLUE SHIELD | Admitting: Nurse Practitioner

## 2014-10-17 ENCOUNTER — Encounter: Payer: Self-pay | Admitting: Nurse Practitioner

## 2014-10-17 VITALS — BP 160/74 | HR 80 | Ht 69.0 in | Wt 176.6 lb

## 2014-10-17 DIAGNOSIS — Z8673 Personal history of transient ischemic attack (TIA), and cerebral infarction without residual deficits: Secondary | ICD-10-CM

## 2014-10-17 DIAGNOSIS — R5601 Complex febrile convulsions: Secondary | ICD-10-CM | POA: Diagnosis not present

## 2014-10-17 NOTE — Patient Instructions (Addendum)
Continue Keppra at current dose,  Continue Plavix at current dose  Please make sure to take her blood pressure medications daily as directed blood pressure elevated in the office today 160/74 Reviewed lipid lipid profile from 11/15. , keep LDL less than 100 total cholesterol less than 200 Continue to exercise regularly maintain healthy body weight  Given copy of recent carotid Doppler F/U in one year

## 2014-10-17 NOTE — Progress Notes (Signed)
[facility]  PATIENT: Diana Walters DOB: [birth year]   REASON FOR VISIT: Follow-up for seizure disorder, history of stroke in 2014  HISTORY FROM: Patient    HISTORY OF PRESENT ILLNESS:Ms. Diana Walters, 59 year old female returns for follow-up. She has a history of stroke in 2014 when she had drooling on the left side of her mouth with dysarthria. Her dysarthria has resolved completely. She remains on Plavix for secondary stroke prevention. Carotid Doppler March 2016 was negative for significant stenosis of carotid arteries  She denies any repeat stroke symptoms. Her blood pressure is 160/74. She has not taken her hypertensive medications today. She has a LINQ loop recorder which has not detected atrial fibrillation to date. She states she quit smoking last year. Lipid profile followed by PCP Dr. Loanne Drilling .She also has a history of remote seizures and is on Keppra. She has not had any seizure activity in 2.5 years. The last time she had seizure she had run out of her medication. She returns for reevaluation.  12/29/12 (PS): She was admitted on 11/29/12 and when she woke up for drooling of the left side of her mouth and dysarthria. CT scan of the head showed right frontal opercular hypodensity an MRI scan confirmed acute nonhemorrhagic infarct involving the right insular cortex and operculum as well as multiple remote lacunar infarcts involving left pons and right cerebellum. There also multiple punctate areas of microhemorrhages scattered throughout the brain due to small vessel disease. Transthoracic echo showed normal ejection fraction. TEE showed a small PFO but no clot. Total cholesterol was 184, triglycerides 60, HDL 74 and LDL 97 mg percent. Hemoglobin and 1C was 5.8%. Urine drug screen was positive for marijuana. CT angiogram of the chest showed old para thoracic aorta with stent and graft but no endovascular leak. Carotid Doppler showed 1-39% bilateral ICA stenosis. Left  vertebral artery flow is retrograde. She had remote history of seizures which was stable on Keppra. She is advised to continue Plavix because of her peripheral vascular disease and maintain strict control of hypertension and hyperlipidemia. She states she has done well since discharge and has not had stroke symptoms. She states her blood pressure is under good control though she forgot to take her medication this morning and her blood pressure was significantly elevated in office today at 210/93.   REVIEW OF SYSTEMS: Full 14 system review of systems performed and notable only for those listed, all others are neg:  Constitutional: neg  Cardiovascular: neg Ear/Nose/Throat: neg  Skin: neg Eyes: neg Respiratory: neg Gastroitestinal: neg  Hematology/Lymphatic: neg  Endocrine: neg Musculoskeletal: Joint pain joint swelling, walking difficulty Allergy/Immunology: neg Neurological: History of stroke and seizure disorder Psychiatric: neg Sleep : neg   ALLERGIES: No Known Allergies  HOME MEDICATIONS: Outpatient Prescriptions Prior to Visit  Medication Sig Dispense Refill  . amLODipine (NORVASC) 10 MG tablet Take 1 tablet (10 mg total) by mouth daily. 180 tablet 3  . atorvastatin (LIPITOR) 40 MG tablet Take 1 tablet (40 mg total) by mouth daily. 90 tablet 3  . carvedilol (COREG) 3.125 MG tablet Take 3.125 mg by mouth 2 (two) times daily with a meal.    . clopidogrel (PLAVIX) 75 MG tablet Take 1 tablet (75 mg total) by mouth daily with breakfast. 90 tablet 3  . HYDROcodone-acetaminophen (NORCO/VICODIN) 5-325 MG per tablet Take 1 tablet by mouth every 6 (six) hours as needed (pain).     . InFLIXimab (REMICADE IV) Inject into the vein. Due the 28th of October  At rheumatology    . levETIRAcetam (KEPPRA) 1000 MG tablet TAKE 1 TABLET BY MOUTH TWICE A DAY 60 tablet 2  . losartan (COZAAR) 50 MG tablet TAKE 1 TABLET BY MOUTH EVERY DAY 30 tablet 2  . methotrexate 2.5 MG tablet Take 10 mg by mouth  once a week.     . Multiple Vitamin (MULTIVITAMIN) tablet Take 1 tablet by mouth daily.      Marland Kitchen triamterene-hydrochlorothiazide (MAXZIDE-25) 37.5-25 MG per tablet Take 0.5 tablets by mouth daily. 45 tablet 3  . predniSONE (DELTASONE) 1 MG tablet Take 0.5 mg by mouth daily with breakfast.     No facility-administered medications prior to visit.    PAST MEDICAL HISTORY: Past Medical History  Diagnosis Date  . HYPERCHOLESTEROLEMIA 04/01/2008  . ANXIETY 10/28/2006  . ANEMIA 10/17/2009  . HYPERTENSION 10/28/2006  . 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 SEIZURE DISORDER 04/01/2008  . Menopause     age 47  . Duodenitis without mention of hemorrhage 2005  . Stroke 11/2012  . ASTHMA 10/28/2006    no inhalers per pt  . Arthritis   . Blood transfusion without reported diagnosis   . Depression   . Heart murmur   . Osteoporosis     bil knees    PAST SURGICAL HISTORY: Past Surgical History  Procedure Laterality Date  . Esophagogastroduodenoscopy  01/29/2004  . Tee without cardioversion N/A 12/01/2012    Procedure: TRANSESOPHAGEAL ECHOCARDIOGRAM (TEE);  Surgeon: Fay Records, MD;  Location: [facility] ENDOSCOPY;  Service: Cardiovascular;  Laterality: N/A;  . Loop recorder implant  12-01-2012    MDT LinQ implanted by Dr Rayann Heman for cyrptogenic stroke  . Loop recorder implant N/A 12/01/2012    Procedure: LOOP RECORDER IMPLANT;  Surgeon: Coralyn Mark, MD;  Location: [facility] CATH LAB;  Service: Cardiovascular;  Laterality: N/A;    FAMILY HISTORY: Family History  Problem Relation Age of Onset  . Breast cancer Mother   . Colon cancer Neg Hx   . Esophageal cancer Neg Hx   . Pancreatic cancer Neg Hx   . Rectal cancer Neg Hx   . Stomach cancer Neg Hx     SOCIAL HISTORY: Social History   Social History  . Marital Status: Single    Spouse Name: N/A  . Number of Children: N/A  . Years of Education: N/A   Occupational History  . Not on file.   Social History Main Topics  . Smoking status: Current  Every Day Smoker -- 0.10 packs/day    Types: Cigarettes  . Smokeless tobacco: Never Used  . Alcohol Use: No  . Drug Use: Yes    Special: Marijuana  . Sexual Activity: Not on file   Other Topics Concern  . Not on file   Social History Narrative     PHYSICAL EXAM  Filed Vitals:   10/17/14 0835  BP: 160/74  Pulse: 80  Height: 5\' 9"  (1.753 m)  Weight: 176 lb 9.6 oz (80.105 kg)   Body mass index is 26.07 kg/(m^2). General: Frail middle-aged African American lady, seated, in no evident distress Head: head normocephalic and atraumatic. Orohparynx benign Neck: supple with no carotid or supraclavicular bruits Cardiovascular: regular rate and rhythm, 4/6 systolic murmur Musculoskeletal: no deformity Skin: no rash/petichiae  Neurologic Exam Mental Status: Awake and fully alert. Oriented to place and time. Recent and remote memory intact. Attention span, concentration and fund of knowledge appropriate. Mood and affect appropriate.  Cranial Nerves: Fundoscopic exam not done. Pupils equal, briskly reactive  to light. Extraocular movements full without nystagmus. Visual fields full to confrontation. Hearing intact. Facial sensation intact. Face, tongue, palate moves normally and symmetrically.  Motor: Normal bulk and tone. Normal strength in all tested extremity muscles. No focal weakness Sensory: intact to light touch in all 4 extremities Coordination: Rapid alternating movements normal in all extremities. Finger-to-nose and heel-to-shin performed accurately bilaterally. Gait and Station: Arises from chair without difficulty. Stance is normal. Gait demonstrates normal stride length and balance . Able to heel, toe walk  without difficulty. Tandem gait mildly unsteady. No assistive device Reflexes: 1+ and symmetric. Toes downgoing.  DIAGNOSTIC DATA (LABS, IMAGING, TESTING)      ASSESSMENT AND PLAN  59 y.o. year old female  has a past medical history of HYPERCHOLESTEROLEMIA  (04/01/2008);  HYPERTENSION (10/28/2006); THORACIC AORTIC ANEURYSM (04/01/2008); SEIZURE DISORDER (04/01/2008); Heart murmur; and history of stroke in 2014.  Continue Keppra at current dose, refilled by primary care Continue Plavix at current dose does not need refills Please make sure to take her blood pressure medications daily as directed blood pressure elevated in the office today 160/74 Reviewed lipid lipid profile from 11/15. , keep LDL less than 100 total cholesterol less than 200 Continue to exercise regularly maintain healthy body weight  Given copy of recent carotid Doppler which was negative for significant stenosis F/U in one year Dennie Bible, [facility], [facility], [facility] [address], [place], [place] [address] [phone]

## 2014-10-18 NOTE — Progress Notes (Signed)
I agree with the above plan 

## 2014-10-22 ENCOUNTER — Other Ambulatory Visit: Payer: Self-pay | Admitting: Internal Medicine

## 2014-10-23 ENCOUNTER — Encounter: Payer: Self-pay | Admitting: Internal Medicine

## 2014-10-31 ENCOUNTER — Ambulatory Visit (INDEPENDENT_AMBULATORY_CARE_PROVIDER_SITE_OTHER): Payer: Self-pay | Admitting: *Deleted

## 2014-10-31 DIAGNOSIS — I639 Cerebral infarction, unspecified: Secondary | ICD-10-CM

## 2014-10-31 NOTE — Progress Notes (Signed)
Loop recorder 

## 2014-11-23 LAB — CUP PACEART REMOTE DEVICE CHECK: MDC IDC SESS DTM: 20160915033656

## 2014-11-23 NOTE — Progress Notes (Signed)
Carelink summary report received. Battery status OK. Normal device function. No new symptom episodes, tachy episodes, or pause episodes. No new AF episodes. 10 brady---nocturnal. Monthly summary reports and ROV w/ JA PRN.

## 2014-11-26 ENCOUNTER — Telehealth: Payer: Self-pay | Admitting: *Deleted

## 2014-11-26 NOTE — Telephone Encounter (Signed)
Called patient to schedule an appointment with Device [facility] for Diana Walters reprogramming per Dr. Rayann Heman.  Patient accepted appointment on 12/05/14 at 10:00am.  Patient denies additional questions or concerns at this time.

## 2014-11-29 ENCOUNTER — Ambulatory Visit (INDEPENDENT_AMBULATORY_CARE_PROVIDER_SITE_OTHER): Payer: BLUE CROSS/BLUE SHIELD | Admitting: *Deleted

## 2014-11-29 DIAGNOSIS — I639 Cerebral infarction, unspecified: Secondary | ICD-10-CM | POA: Diagnosis not present

## 2014-12-05 ENCOUNTER — Ambulatory Visit (INDEPENDENT_AMBULATORY_CARE_PROVIDER_SITE_OTHER): Payer: BLUE CROSS/BLUE SHIELD | Admitting: *Deleted

## 2014-12-05 ENCOUNTER — Encounter: Payer: Self-pay | Admitting: Internal Medicine

## 2014-12-05 DIAGNOSIS — I639 Cerebral infarction, unspecified: Secondary | ICD-10-CM

## 2014-12-05 LAB — CUP PACEART INCLINIC DEVICE CHECK: MDC IDC SESS DTM: 20161020103946

## 2014-12-05 NOTE — Progress Notes (Signed)
Loop check in clinic. Pt with 0 tachy episodes; (86) brady episodes---max dur. 41 sec, Slowest median V rate <30---nocturnal 2:1AVB---no sx's per pt; 0 asystole; 0 symptom episodes; 0 AF episodes.  Pause/Brady detections d/c'd per JA. Plan to continue Carelink f/u QMO.

## 2014-12-06 NOTE — Progress Notes (Signed)
LOOP RECORDER  

## 2014-12-09 ENCOUNTER — Encounter: Payer: Self-pay | Admitting: Internal Medicine

## 2014-12-17 LAB — CUP PACEART REMOTE DEVICE CHECK: Date Time Interrogation Session: 20161015040749

## 2014-12-17 NOTE — Progress Notes (Signed)
Carelink summary report received. Battery status OK. Normal device function. No new symptom, tachy, pause, or AF episodes. 9 brady episodes, 2:1 HB, mostly nocturnal, all asymptomatic, appointment with device clinic on 12/05/14 to turn off pause and brady detection per JA. Monthly summary reports and ROV with JA PRN.

## 2014-12-27 ENCOUNTER — Other Ambulatory Visit: Payer: Self-pay | Admitting: *Deleted

## 2014-12-27 DIAGNOSIS — Z01812 Encounter for preprocedural laboratory examination: Secondary | ICD-10-CM

## 2014-12-28 ENCOUNTER — Other Ambulatory Visit: Payer: Self-pay | Admitting: Internal Medicine

## 2014-12-30 ENCOUNTER — Other Ambulatory Visit: Payer: BLUE CROSS/BLUE SHIELD

## 2014-12-30 ENCOUNTER — Ambulatory Visit (INDEPENDENT_AMBULATORY_CARE_PROVIDER_SITE_OTHER): Payer: BLUE CROSS/BLUE SHIELD | Admitting: *Deleted

## 2014-12-30 ENCOUNTER — Inpatient Hospital Stay: Admission: RE | Admit: 2014-12-30 | Payer: BLUE CROSS/BLUE SHIELD | Source: Ambulatory Visit

## 2014-12-30 DIAGNOSIS — I639 Cerebral infarction, unspecified: Secondary | ICD-10-CM

## 2014-12-30 NOTE — Progress Notes (Signed)
Carelink summary report / loop recorder 

## 2015-01-03 ENCOUNTER — Ambulatory Visit
Admission: RE | Admit: 2015-01-03 | Discharge: 2015-01-03 | Disposition: A | Discharge status: Home / Self Care | Payer: BLUE CROSS/BLUE SHIELD | Source: Ambulatory Visit | Attending: Surgery | Admitting: Surgery

## 2015-01-03 DIAGNOSIS — IMO0001 Reserved for inherently not codable concepts without codable children: Secondary | ICD-10-CM

## 2015-01-03 DIAGNOSIS — I714 Abdominal aortic aneurysm, without rupture, unspecified: Secondary | ICD-10-CM

## 2015-01-03 DIAGNOSIS — T82330D Leakage of aortic (bifurcation) graft (replacement), subsequent encounter: Secondary | ICD-10-CM

## 2015-01-03 MED ORDER — IOPAMIDOL (ISOVUE-370) INJECTION 76%
80.0000 mL | Freq: Once | INTRAVENOUS | Status: AC | PRN
  Administered 2015-01-03: 80 mL via INTRAVENOUS

## 2015-01-06 ENCOUNTER — Ambulatory Visit: Payer: BLUE CROSS/BLUE SHIELD | Admitting: Surgery

## 2015-01-13 ENCOUNTER — Ambulatory Visit: Payer: Self-pay | Admitting: Surgery

## 2015-01-18 ENCOUNTER — Other Ambulatory Visit: Payer: Self-pay | Admitting: Internal Medicine

## 2015-01-21 ENCOUNTER — Encounter: Payer: Self-pay | Admitting: Surgery

## 2015-01-23 ENCOUNTER — Other Ambulatory Visit: Payer: Self-pay | Admitting: Pain Medicine

## 2015-01-23 DIAGNOSIS — M542 Cervicalgia: Secondary | ICD-10-CM

## 2015-01-23 DIAGNOSIS — M545 Low back pain: Secondary | ICD-10-CM

## 2015-01-27 ENCOUNTER — Ambulatory Visit (INDEPENDENT_AMBULATORY_CARE_PROVIDER_SITE_OTHER): Payer: BLUE CROSS/BLUE SHIELD | Admitting: Surgery

## 2015-01-27 ENCOUNTER — Encounter: Payer: Self-pay | Admitting: Surgery

## 2015-01-27 VITALS — BP 175/72 | HR 84 | Ht 69.0 in | Wt 182.3 lb

## 2015-01-27 DIAGNOSIS — T82330D Leakage of aortic (bifurcation) graft (replacement), subsequent encounter: Secondary | ICD-10-CM

## 2015-01-27 DIAGNOSIS — IMO0001 Reserved for inherently not codable concepts without codable children: Secondary | ICD-10-CM

## 2015-01-27 NOTE — Progress Notes (Signed)
Patient name: Diana Walters MRN: [id number] DOB: 01-10-1956 Sex: female     Chief Complaint  Patient presents with  . Re-evaluation    6 month f/u CTA chest/abd/pelv    HISTORY OF PRESENT ILLNESS: This is a very pleasant 59 year old female who comes in today for transfer of care regarding her thoracic aortic aneurysm. The patient reports that this was repaired at [facility] last year. This appears to be a hybrid repair with aortic arch de-branching and endovascular stenting. 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 She was transferred to [facility] for further workup. The patient states that no intervention was performed.  She has no complaints today    Past Medical History  Diagnosis Date  . HYPERCHOLESTEROLEMIA 04/01/2008  . ANXIETY 10/28/2006  . ANEMIA 10/17/2009  . HYPERTENSION 10/28/2006  . 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 SEIZURE DISORDER 04/01/2008  . Menopause     age 85  . Duodenitis without mention of hemorrhage 2005  . Stroke ([place]) 11/2012  . ASTHMA 10/28/2006    no inhalers per pt  . Arthritis   . Blood transfusion without reported diagnosis   . Depression   . Heart murmur   . Osteoporosis     bil knees    Past Surgical History  Procedure Laterality Date  . Esophagogastroduodenoscopy  01/29/2004  . Tee without cardioversion N/A 12/01/2012    Procedure: TRANSESOPHAGEAL ECHOCARDIOGRAM (TEE);  Surgeon: Fay Records, MD;  Location: [facility] ENDOSCOPY;  Service: Cardiovascular;  Laterality: N/A;  . Loop recorder implant  12-01-2012    MDT LinQ implanted by Dr Rayann Heman for cyrptogenic stroke  . Loop recorder implant N/A 12/01/2012    Procedure: LOOP RECORDER IMPLANT;  Surgeon: Coralyn Mark, MD;  Location: [place] CATH LAB;  Service: Cardiovascular;  Laterality: N/A;    Social History   Social History  . Marital Status: Single    Spouse Name: N/A  . Number of Children: N/A  . Years of Education: N/A   Occupational History  . Not on file.    Social History Main Topics  . Smoking status: Current Every Day Smoker -- 0.10 packs/day for 12 years    Types: Cigarettes  . Smokeless tobacco: Never Used  . Alcohol Use: No  . Drug Use: Yes    Special: Marijuana  . Sexual Activity: Not on file   Other Topics Concern  . Not on file   Social History Narrative    Family History  Problem Relation Age of Onset  . Breast cancer Mother   . Heart disease Mother     before age 28  . Colon cancer Neg Hx   . Esophageal cancer Neg Hx   . Pancreatic cancer Neg Hx   . Rectal cancer Neg Hx   . Stomach cancer Neg Hx     Allergies as of 01/27/2015  . (No Known Allergies)    Current Outpatient Prescriptions on File Prior to Visit  Medication Sig Dispense Refill  . amLODipine (NORVASC) 10 MG tablet TAKE 1 TABLET BY MOUTH EVERY DAY 90 tablet 0  . atorvastatin (LIPITOR) 40 MG tablet TAKE 1 TABLET BY MOUTH EVERY DAY 90 tablet 0  . carvedilol (COREG) 3.125 MG tablet Take 3.125 mg by mouth 2 (two) times daily with a meal.    . clopidogrel (PLAVIX) 75 MG tablet Take 1 tablet (75 mg total) by mouth daily with breakfast. 90 tablet 3  . HYDROcodone-acetaminophen (NORCO/VICODIN)  5-325 MG per tablet Take 1 tablet by mouth every 6 (six) hours as needed (pain).     . InFLIXimab (REMICADE IV) Inject into the vein. Due the 28th of October    At rheumatology    . levETIRAcetam (KEPPRA) 1000 MG tablet TAKE 1 TABLET BY MOUTH TWICE A DAY 60 tablet 2  . losartan (COZAAR) 50 MG tablet TAKE 1 TABLET BY MOUTH EVERY DAY 30 tablet 2  . methotrexate 2.5 MG tablet Take 10 mg by mouth once a week.     . Multiple Vitamin (MULTIVITAMIN) tablet Take 1 tablet by mouth daily.      . predniSONE (DELTASONE) 5 MG tablet TAKE 1/2 TABLET BY MOUTH DAILY. (START AFTER PREDNISONE PACK)  2  . triamterene-hydrochlorothiazide (MAXZIDE-25) 37.5-25 MG per tablet TAKE 0.5 TABLETS BY MOUTH DAILY. 45 tablet 3   No current facility-administered medications on file prior to visit.      REVIEW OF SYSTEMS: Cardiovascular: No chest pain, chest pressure, palpitations, orthopnea, or dyspnea on exertion. No claudication or rest pain,  No history of DVT or phlebitis. Pulmonary: No productive cough, asthma or wheezing. Neurologic: No weakness, paresthesias, aphasia, or amaurosis. No dizziness. Hematologic: No bleeding problems or clotting disorders. Musculoskeletal: No joint pain or joint swelling. Gastrointestinal: No blood in stool or hematemesis Genitourinary: No dysuria or hematuria. Psychiatric:: No history of major depression. Integumentary: No rashes or ulcers. Constitutional: No fever or chills.  PHYSICAL EXAMINATION:   Vital signs are  Filed Vitals:   01/27/15 1039 01/27/15 1044  BP: 179/66 175/72  Pulse: 84   Height: 5\' 9"  (1.753 m)   Weight: 182 lb 4.8 oz (82.691 kg)   SpO2: 97%    Body mass index is 26.91 kg/(m^2). General: The patient appears their stated age. HEENT:  No gross abnormalities Pulmonary:  Non labored breathing Musculoskeletal: There are no major deformities. Neurologic: No focal weakness or paresthesias are detected, Skin: There are no ulcer or rashes noted. Psychiatric: The patient has normal affect. Cardiovascular: There is a regular rate and rhythm without significant murmur appreciated.   Diagnostic Studies I reviewed her CT scan with the following findings Status post endograft repair of ascending and descending thoracic aortic aneurysm, with maximum measured diameter of ascending portion at 4.9 cm and descending portion at 5.2 cm. This is stable compared to prior exam.  Grossly stable small type 1 endoleak is noted at the proximal margin of the stent graft in the ascending thoracic aorta. Assessment: Status post arch de-branching and thoracic stent graft placement at [place]: I reviewed the CT scan findings with the patient today.  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  Fortunately there has been no significant  change in the size or aneurysm.  Therefore, I have recommended ongoing surveillance with the next scan being in one year.  Eldridge Abrahams, M.D. [facility] Office: [phone] Pager:  [phone]

## 2015-01-27 NOTE — Addendum Note (Signed)
Addended by: Dorthula Rue L on: 01/27/2015 03:47 PM   Modules accepted: Orders

## 2015-01-28 ENCOUNTER — Ambulatory Visit (INDEPENDENT_AMBULATORY_CARE_PROVIDER_SITE_OTHER): Payer: BLUE CROSS/BLUE SHIELD | Admitting: *Deleted

## 2015-01-28 DIAGNOSIS — I639 Cerebral infarction, unspecified: Secondary | ICD-10-CM

## 2015-01-29 NOTE — Progress Notes (Signed)
Carelink Summary Report / Loop Recorder 

## 2015-02-03 LAB — CUP PACEART REMOTE DEVICE CHECK: MDC IDC SESS DTM: 20161114043723

## 2015-02-03 NOTE — Progress Notes (Signed)
Carelink summary report received. Battery status OK. Normal device function. No new symptom, tachy, pause, or AF episodes. 5 brady episodes--all nocturnal, longest 30sec, brady and pause detect programmed off at 12/05/14 appointment per JA. Monthly summary reports and ROV with JA PRN.

## 2015-02-04 ENCOUNTER — Ambulatory Visit
Admission: RE | Admit: 2015-02-04 | Discharge: 2015-02-04 | Disposition: A | Discharge status: Home / Self Care | Payer: BLUE CROSS/BLUE SHIELD | Source: Ambulatory Visit | Attending: Pain Medicine | Admitting: Pain Medicine

## 2015-02-04 DIAGNOSIS — M542 Cervicalgia: Secondary | ICD-10-CM

## 2015-02-04 DIAGNOSIS — M545 Low back pain: Secondary | ICD-10-CM

## 2015-02-28 ENCOUNTER — Ambulatory Visit (INDEPENDENT_AMBULATORY_CARE_PROVIDER_SITE_OTHER): Payer: BLUE CROSS/BLUE SHIELD | Admitting: *Deleted

## 2015-02-28 DIAGNOSIS — I639 Cerebral infarction, unspecified: Secondary | ICD-10-CM | POA: Diagnosis not present

## 2015-03-03 NOTE — Progress Notes (Signed)
Carelink Summary Report / Loop Recorder 

## 2015-03-15 LAB — CUP PACEART REMOTE DEVICE CHECK: MDC IDC SESS DTM: 20161214050733

## 2015-03-28 ENCOUNTER — Encounter: Payer: Self-pay | Admitting: *Deleted

## 2015-03-31 ENCOUNTER — Ambulatory Visit (INDEPENDENT_AMBULATORY_CARE_PROVIDER_SITE_OTHER): Payer: BLUE CROSS/BLUE SHIELD | Admitting: *Deleted

## 2015-03-31 DIAGNOSIS — I639 Cerebral infarction, unspecified: Secondary | ICD-10-CM | POA: Diagnosis not present

## 2015-03-31 NOTE — Progress Notes (Signed)
Carelink Summary Report / Loop Recorder 

## 2015-04-11 ENCOUNTER — Other Ambulatory Visit: Payer: Self-pay | Admitting: Nurse Practitioner

## 2015-04-14 ENCOUNTER — Other Ambulatory Visit: Payer: Self-pay | Admitting: Pain Medicine

## 2015-04-14 DIAGNOSIS — M542 Cervicalgia: Secondary | ICD-10-CM

## 2015-04-21 NOTE — Progress Notes (Signed)
Carelink summary report received. Battery status OK. Normal device function. No new symptom episodes, tachy episodes, brady, or pause episodes. No new AF episodes. Monthly summary reports and ROV/PRN 

## 2015-04-23 LAB — CUP PACEART REMOTE DEVICE CHECK: MDC IDC SESS DTM: 20170212053708

## 2015-04-25 LAB — CUP PACEART REMOTE DEVICE CHECK: Date Time Interrogation Session: 20170113055409

## 2015-04-29 ENCOUNTER — Ambulatory Visit (INDEPENDENT_AMBULATORY_CARE_PROVIDER_SITE_OTHER): Payer: BLUE CROSS/BLUE SHIELD | Admitting: *Deleted

## 2015-04-29 DIAGNOSIS — I639 Cerebral infarction, unspecified: Secondary | ICD-10-CM | POA: Diagnosis not present

## 2015-04-29 NOTE — Progress Notes (Signed)
Carelink Summary Report / Loop Recorder 

## 2015-05-11 ENCOUNTER — Other Ambulatory Visit: Payer: Self-pay | Admitting: Internal Medicine

## 2015-05-29 ENCOUNTER — Ambulatory Visit (INDEPENDENT_AMBULATORY_CARE_PROVIDER_SITE_OTHER): Payer: BLUE CROSS/BLUE SHIELD | Admitting: *Deleted

## 2015-05-29 DIAGNOSIS — I639 Cerebral infarction, unspecified: Secondary | ICD-10-CM | POA: Diagnosis not present

## 2015-05-31 ENCOUNTER — Other Ambulatory Visit: Payer: Self-pay | Admitting: Nurse Practitioner

## 2015-06-02 NOTE — Progress Notes (Signed)
Carelink Summary Report / Loop Recorder 

## 2015-06-05 ENCOUNTER — Other Ambulatory Visit: Payer: Self-pay | Admitting: Endocrinology

## 2015-06-06 ENCOUNTER — Ambulatory Visit (INDEPENDENT_AMBULATORY_CARE_PROVIDER_SITE_OTHER): Payer: BLUE CROSS/BLUE SHIELD | Admitting: Nurse Practitioner

## 2015-06-06 ENCOUNTER — Encounter: Payer: Self-pay | Admitting: Nurse Practitioner

## 2015-06-06 VITALS — BP 168/70 | HR 78 | Ht 69.0 in | Wt 180.2 lb

## 2015-06-06 DIAGNOSIS — R5601 Complex febrile convulsions: Secondary | ICD-10-CM | POA: Diagnosis not present

## 2015-06-06 DIAGNOSIS — Z8673 Personal history of transient ischemic attack (TIA), and cerebral infarction without residual deficits: Secondary | ICD-10-CM

## 2015-06-06 DIAGNOSIS — I10 Essential (primary) hypertension: Secondary | ICD-10-CM | POA: Diagnosis not present

## 2015-06-06 DIAGNOSIS — I639 Cerebral infarction, unspecified: Secondary | ICD-10-CM

## 2015-06-06 MED ORDER — LEVETIRACETAM 1000 MG PO TABS: 1000.0000 mg | ORAL_TABLET | Freq: Two times a day (BID) | ORAL | Status: DC

## 2015-06-06 MED ORDER — CLOPIDOGREL BISULFATE 75 MG PO TABS: ORAL_TABLET | ORAL | Status: DC

## 2015-06-06 NOTE — Progress Notes (Signed)
[facility]  PATIENT: Diana Walters DOB: 10/19/1955   REASON FOR VISIT: Follow-up for history of stroke, complex partial seizure disorder HISTORY FROM: Patient    HISTORY OF PRESENT ILLNESS:UPDATE 06/06/2015 CM Ms. Cranor, 60 year old female returns for follow-up. She has a history of stroke in 2014 when she had drooling on the left side of her mouth with dysarthria. Her dysarthria has resolved completely. She remains on Plavix for secondary stroke prevention. Carotid Doppler March 2016 was negative for significant stenosis of carotid arteries She denies any repeat stroke symptoms. Her blood pressure is 168/70. She has not taken her hypertensive medications today. She has a LINQ loop recorder which has not detected atrial fibrillation to date. She states she quit smoking 2 years ago.  Lipid profile followed by PCP Dr. Loanne Drilling .She also has a history of remote seizures and is on Keppra. She has not had any seizure activity in 3.5 years. The last time she had seizure she had run out of her medication. She sees Dr. Lynnda Shields for pain management of her back pain. She returns for reevaluation.  12/29/12 (PS): She was admitted on 11/29/12 and when she woke up for drooling of the left side of her mouth and dysarthria. CT scan of the head showed right frontal opercular hypodensity an MRI scan confirmed acute nonhemorrhagic infarct involving the right insular cortex and operculum as well as multiple remote lacunar infarcts involving left pons and right cerebellum. There also multiple punctate areas of microhemorrhages scattered throughout the brain due to small vessel disease. Transthoracic echo showed normal ejection fraction. TEE showed a small PFO but no clot. Total cholesterol was 184, triglycerides 60, HDL 74 and LDL 97 mg percent. Hemoglobin and 1C was 5.8%. Urine drug screen was positive for marijuana. CT angiogram of the chest showed old para thoracic aorta with stent and graft  but no endovascular leak. Carotid Doppler showed 1-39% bilateral ICA stenosis. Left vertebral artery flow is retrograde. She had remote history of seizures which was stable on Keppra. She is advised to continue Plavix because of her peripheral vascular disease and maintain strict control of hypertension and hyperlipidemia. She states she has done well since discharge and has not had stroke symptoms. She states her blood pressure is under good control though she forgot to take her medication this morning and her blood pressure was significantly elevated in office today at 210/93.    REVIEW OF SYSTEMS: Full 14 system review of systems performed and notable only for those listed, all others are neg:  Constitutional: neg  Cardiovascular: neg Ear/Nose/Throat: neg  Skin: neg Eyes: neg Respiratory: neg Gastroitestinal: neg  Hematology/Lymphatic: Easy bruising Endocrine: neg Musculoskeletal: Joint pain joint swelling Allergy/Immunology: neg Neurological: neg Psychiatric: neg Sleep : neg   ALLERGIES: No Known Allergies  HOME MEDICATIONS: Outpatient Prescriptions Prior to Visit  Medication Sig Dispense Refill  . amLODipine (NORVASC) 10 MG tablet TAKE 1 TABLET BY MOUTH EVERY DAY 30 tablet 0  . atorvastatin (LIPITOR) 40 MG tablet TAKE 1 TABLET BY MOUTH EVERY DAY 90 tablet 0  . clopidogrel (PLAVIX) 75 MG tablet TAKE 1 TABLET BY MOUTH EVERY DAY WITH BREAKFAST 30 tablet 0  . HYDROcodone-acetaminophen (NORCO/VICODIN) 5-325 MG per tablet Take 1 tablet by mouth every 6 (six) hours as needed (pain).     . InFLIXimab (REMICADE IV) Inject into the vein. Due the 28th of October    At rheumatology    . methotrexate 2.5 MG tablet Take 10 mg by mouth once  a week.     . Multiple Vitamin (MULTIVITAMIN) tablet Take 1 tablet by mouth daily.      . predniSONE (DELTASONE) 5 MG tablet TAKE 1/2 TABLET BY MOUTH DAILY. (START AFTER PREDNISONE PACK)  2  . triamterene-hydrochlorothiazide (MAXZIDE-25) 37.5-25 MG per  tablet TAKE 0.5 TABLETS BY MOUTH DAILY. 45 tablet 3  . levETIRAcetam (KEPPRA) 1000 MG tablet TAKE 1 TABLET BY MOUTH TWICE A DAY (Patient not taking: Reported on 06/06/2015) 60 tablet 2  . carvedilol (COREG) 3.125 MG tablet Take 3.125 mg by mouth 2 (two) times daily with a meal. Reported on 06/06/2015    . losartan (COZAAR) 50 MG tablet TAKE 1 TABLET BY MOUTH EVERY DAY (Patient not taking: Reported on 06/06/2015) 30 tablet 2   No facility-administered medications prior to visit.    PAST MEDICAL HISTORY: Past Medical History  Diagnosis Date  . HYPERCHOLESTEROLEMIA 04/01/2008  . ANXIETY 10/28/2006  . ANEMIA 10/17/2009  . HYPERTENSION 10/28/2006  . 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 SEIZURE DISORDER 04/01/2008  . Menopause     age 56  . Duodenitis without mention of hemorrhage 2005  . Stroke ([place]) 11/2012  . ASTHMA 10/28/2006    no inhalers per pt  . Arthritis   . Blood transfusion without reported diagnosis   . Depression   . Heart murmur   . Osteoporosis     bil knees    PAST SURGICAL HISTORY: Past Surgical History  Procedure Laterality Date  . Esophagogastroduodenoscopy  01/29/2004  . Tee without cardioversion N/A 12/01/2012    Procedure: TRANSESOPHAGEAL ECHOCARDIOGRAM (TEE);  Surgeon: Fay Records, MD;  Location: [facility] ENDOSCOPY;  Service: Cardiovascular;  Laterality: N/A;  . Loop recorder implant  12-01-2012    MDT LinQ implanted by Dr Rayann Heman for cyrptogenic stroke  . Loop recorder implant N/A 12/01/2012    Procedure: LOOP RECORDER IMPLANT;  Surgeon: Coralyn Mark, MD;  Location: [facility] CATH LAB;  Service: Cardiovascular;  Laterality: N/A;    FAMILY HISTORY: Family History  Problem Relation Age of Onset  . Breast cancer Mother   . Heart disease Mother     before age 80  . Colon cancer Neg Hx   . Esophageal cancer Neg Hx   . Pancreatic cancer Neg Hx   . Rectal cancer Neg Hx   . Stomach cancer Neg Hx     SOCIAL HISTORY: Social History   Social History  . Marital Status:  Single    Spouse Name: N/A  . Number of Children: N/A  . Years of Education: N/A   Occupational History  . Not on file.   Social History Main Topics  . Smoking status: Current Every Day Smoker -- 0.10 packs/day for 12 years    Types: Cigarettes  . Smokeless tobacco: Never Used  . Alcohol Use: No  . Drug Use: Yes    Special: Marijuana  . Sexual Activity: Not on file   Other Topics Concern  . Not on file   Social History Narrative     PHYSICAL EXAM  Filed Vitals:   06/06/15 0903  BP: 168/70  Pulse: 78  Height: 5\' 9"  (1.753 m)  Weight: 180 lb 3.2 oz (81.738 kg)   Body mass index is 26.6 kg/(m^2). General: Frail middle-aged African American lady, seated, in no evident distress Head: head normocephalic and atraumatic. Orohparynx benign Neck: supple with no carotid or supraclavicular bruits Cardiovascular: regular rate and rhythm, 4/6 systolic murmur Musculoskeletal: no deformity Skin: no rash/petichiae  Neurologic Exam Mental  Status: Awake and fully alert. Oriented to place and time. Recent and remote memory intact. Attention span, concentration and fund of knowledge appropriate. Mood and affect appropriate.  Cranial Nerves: Fundoscopic exam not done. Pupils equal, briskly reactive to light. Extraocular movements full without nystagmus. Visual fields full to confrontation. Hearing intact. Facial sensation intact. Face, tongue, palate moves normally and symmetrically.  Motor: Normal bulk and tone. Normal strength in all tested extremity muscles.  Sensory: intact to light touch in all 4 extremities Coordination: Rapid alternating movements normal in all extremities. Finger-to-nose and heel-to-shin performed accurately bilaterally. Gait and Station: Arises from chair without difficulty. Stance is normal. Gait demonstrates normal stride length and balance . Able to heel, toe walk without difficulty. Tandem gait mildly unsteady. No assistive device Reflexes: 1+ and  symmetric. Toes downgoing.  DIAGNOSTIC DATA (LABS, IMAGING, TESTING)  ASSESSMENT AND PLAN 60 y.o. year old female has a past medical history of HYPERCHOLESTEROLEMIA (04/01/2008); HYPERTENSION (10/28/2006); THORACIC AORTIC ANEURYSM (04/01/2008); SEIZURE DISORDER (04/01/2008); Heart murmur; and history of stroke in 2014.The patient is a current patient of Dr. Leonie Man  who is out of the office today . This note is sent to the work in doctor.     PLAN Continue Keppra at current dose, will refill for 1 year Continue Plavix at current dose  Will refill for 1 year Please make sure to take her blood pressure medications daily as directed blood pressure elevated in the office today 168/78 keep LDL less than 100 total cholesterol less than 200 monitored by PCP Continue to exercise regularly maintain healthy body weight  F/U in one year Dennie Bible, [facility], [facility], [facility] [address], [place] [employer], [place] [address] [phone]

## 2015-06-06 NOTE — Patient Instructions (Addendum)
Continue Keppra at current dose, will refill for 1 year Continue Plavix at current dose  Will refill for 1 year Please make sure to take her blood pressure medications daily as directed blood pressure elevated in the office today 168/78 keep LDL less than 100 total cholesterol less than 200 monitored by PCP Continue to exercise regularly maintain healthy body weight  F/U in one year

## 2015-06-15 ENCOUNTER — Other Ambulatory Visit: Payer: Self-pay | Admitting: Internal Medicine

## 2015-06-18 DIAGNOSIS — M0589 Other rheumatoid arthritis with rheumatoid factor of multiple sites: Secondary | ICD-10-CM | POA: Diagnosis not present

## 2015-06-20 ENCOUNTER — Other Ambulatory Visit: Payer: Self-pay | Admitting: Internal Medicine

## 2015-06-30 ENCOUNTER — Ambulatory Visit (INDEPENDENT_AMBULATORY_CARE_PROVIDER_SITE_OTHER): Payer: BLUE CROSS/BLUE SHIELD | Admitting: *Deleted

## 2015-06-30 DIAGNOSIS — I639 Cerebral infarction, unspecified: Secondary | ICD-10-CM

## 2015-06-30 NOTE — Progress Notes (Signed)
Carelink Summary Report / Loop Recorder 

## 2015-07-02 LAB — CUP PACEART REMOTE DEVICE CHECK: Date Time Interrogation Session: 20170513063824

## 2015-07-11 LAB — CUP PACEART REMOTE DEVICE CHECK: MDC IDC SESS DTM: 20170314053545

## 2015-07-13 LAB — CUP PACEART REMOTE DEVICE CHECK: Date Time Interrogation Session: 20170413060741

## 2015-07-13 NOTE — Progress Notes (Signed)
Carelink summary report received. Battery status OK. Normal device function. No new symptom episodes, tachy episodes, brady, or pause episodes. No new AF episodes. Monthly summary reports and ROV/PRN 

## 2015-07-26 ENCOUNTER — Other Ambulatory Visit: Payer: Self-pay | Admitting: Internal Medicine

## 2015-07-28 ENCOUNTER — Ambulatory Visit (INDEPENDENT_AMBULATORY_CARE_PROVIDER_SITE_OTHER): Payer: BLUE CROSS/BLUE SHIELD | Admitting: *Deleted

## 2015-07-28 DIAGNOSIS — I639 Cerebral infarction, unspecified: Secondary | ICD-10-CM

## 2015-07-28 NOTE — Progress Notes (Signed)
Carelink Summary Report / Loop Recorder 

## 2015-08-04 ENCOUNTER — Other Ambulatory Visit: Payer: Self-pay | Admitting: Nurse Practitioner

## 2015-08-05 DIAGNOSIS — Z79899 Other long term (current) drug therapy: Secondary | ICD-10-CM | POA: Diagnosis not present

## 2015-08-05 DIAGNOSIS — M1990 Unspecified osteoarthritis, unspecified site: Secondary | ICD-10-CM | POA: Diagnosis not present

## 2015-08-05 DIAGNOSIS — M0580 Other rheumatoid arthritis with rheumatoid factor of unspecified site: Secondary | ICD-10-CM | POA: Diagnosis not present

## 2015-08-05 DIAGNOSIS — Z5181 Encounter for therapeutic drug level monitoring: Secondary | ICD-10-CM | POA: Diagnosis not present

## 2015-08-11 DIAGNOSIS — D3612 Benign neoplasm of peripheral nerves and autonomic nervous system, upper limb, including shoulder: Secondary | ICD-10-CM | POA: Diagnosis not present

## 2015-08-13 DIAGNOSIS — M0589 Other rheumatoid arthritis with rheumatoid factor of multiple sites: Secondary | ICD-10-CM | POA: Diagnosis not present

## 2015-08-21 LAB — CUP PACEART REMOTE DEVICE CHECK: MDC IDC SESS DTM: 20170612063744

## 2015-08-27 ENCOUNTER — Ambulatory Visit (INDEPENDENT_AMBULATORY_CARE_PROVIDER_SITE_OTHER): Payer: BLUE CROSS/BLUE SHIELD | Admitting: *Deleted

## 2015-08-27 DIAGNOSIS — I639 Cerebral infarction, unspecified: Secondary | ICD-10-CM | POA: Diagnosis not present

## 2015-08-27 NOTE — Progress Notes (Signed)
Carelink Summary Report / Loop Recorder 

## 2015-09-02 DIAGNOSIS — M0580 Other rheumatoid arthritis with rheumatoid factor of unspecified site: Secondary | ICD-10-CM | POA: Diagnosis not present

## 2015-09-02 DIAGNOSIS — Z79899 Other long term (current) drug therapy: Secondary | ICD-10-CM | POA: Diagnosis not present

## 2015-09-02 DIAGNOSIS — Z5181 Encounter for therapeutic drug level monitoring: Secondary | ICD-10-CM | POA: Diagnosis not present

## 2015-09-02 DIAGNOSIS — M1990 Unspecified osteoarthritis, unspecified site: Secondary | ICD-10-CM | POA: Diagnosis not present

## 2015-09-06 LAB — CUP PACEART REMOTE DEVICE CHECK: Date Time Interrogation Session: 20170712073821

## 2015-09-08 ENCOUNTER — Other Ambulatory Visit: Payer: Self-pay

## 2015-09-10 MED ORDER — AMLODIPINE BESYLATE 10 MG PO TABS: 10.0000 mg | ORAL_TABLET | Freq: Every day | ORAL | 0 refills | Status: DC

## 2015-09-15 NOTE — Telephone Encounter (Signed)
Approved    Disp Refills Start End  amLODipine (NORVASC) 10 MG tablet 30 tablet 0 09/10/2015   Sig - Route:  Take 1 tablet (10 mg total) by mouth daily. Pt is overdue for an appt. Please call and schedule for further refills - Oral  Class:  Normal  DAW:  No  Authorizing Provider:  Fay Records, MD  Ordering User:  Rodman Key, RN

## 2015-09-26 ENCOUNTER — Ambulatory Visit (INDEPENDENT_AMBULATORY_CARE_PROVIDER_SITE_OTHER): Payer: BLUE CROSS/BLUE SHIELD | Admitting: *Deleted

## 2015-09-26 DIAGNOSIS — I639 Cerebral infarction, unspecified: Secondary | ICD-10-CM | POA: Diagnosis not present

## 2015-09-29 NOTE — Progress Notes (Signed)
Carelink Summary Report / Loop Recorder 

## 2015-10-15 ENCOUNTER — Other Ambulatory Visit: Payer: Self-pay | Admitting: Internal Medicine

## 2015-10-23 DIAGNOSIS — M0589 Other rheumatoid arthritis with rheumatoid factor of multiple sites: Secondary | ICD-10-CM | POA: Diagnosis not present

## 2015-10-24 ENCOUNTER — Other Ambulatory Visit: Payer: Self-pay | Admitting: Internal Medicine

## 2015-10-25 LAB — CUP PACEART REMOTE DEVICE CHECK: Date Time Interrogation Session: 20170811073838

## 2015-10-27 ENCOUNTER — Ambulatory Visit (INDEPENDENT_AMBULATORY_CARE_PROVIDER_SITE_OTHER): Payer: BLUE CROSS/BLUE SHIELD | Admitting: *Deleted

## 2015-10-27 DIAGNOSIS — I639 Cerebral infarction, unspecified: Secondary | ICD-10-CM | POA: Diagnosis not present

## 2015-10-27 NOTE — Progress Notes (Signed)
Carelink Summary Report / Loop Recorder 

## 2015-11-22 LAB — CUP PACEART REMOTE DEVICE CHECK: Date Time Interrogation Session: 20170910073951

## 2015-11-22 NOTE — Progress Notes (Signed)
Carelink summary report received. Battery status OK. Normal device function. No new symptom episodes, tachy episodes, brady, or pause episodes. No new AF episodes. Monthly summary reports and ROV/PRN 

## 2015-11-25 ENCOUNTER — Ambulatory Visit (INDEPENDENT_AMBULATORY_CARE_PROVIDER_SITE_OTHER): Payer: BLUE CROSS/BLUE SHIELD | Admitting: *Deleted

## 2015-11-25 DIAGNOSIS — I639 Cerebral infarction, unspecified: Secondary | ICD-10-CM | POA: Diagnosis not present

## 2015-11-25 NOTE — Progress Notes (Signed)
Carelink Summary Report / Loop Recorder 

## 2015-12-03 DIAGNOSIS — M0580 Other rheumatoid arthritis with rheumatoid factor of unspecified site: Secondary | ICD-10-CM | POA: Diagnosis not present

## 2015-12-03 DIAGNOSIS — Z5181 Encounter for therapeutic drug level monitoring: Secondary | ICD-10-CM | POA: Diagnosis not present

## 2015-12-03 DIAGNOSIS — M0589 Other rheumatoid arthritis with rheumatoid factor of multiple sites: Secondary | ICD-10-CM | POA: Diagnosis not present

## 2015-12-03 DIAGNOSIS — M1990 Unspecified osteoarthritis, unspecified site: Secondary | ICD-10-CM | POA: Diagnosis not present

## 2015-12-03 DIAGNOSIS — Z79899 Other long term (current) drug therapy: Secondary | ICD-10-CM | POA: Diagnosis not present

## 2015-12-11 ENCOUNTER — Other Ambulatory Visit: Payer: Self-pay | Admitting: Internal Medicine

## 2015-12-11 DIAGNOSIS — M0580 Other rheumatoid arthritis with rheumatoid factor of unspecified site: Secondary | ICD-10-CM | POA: Diagnosis not present

## 2015-12-11 DIAGNOSIS — M1990 Unspecified osteoarthritis, unspecified site: Secondary | ICD-10-CM | POA: Diagnosis not present

## 2015-12-11 DIAGNOSIS — Z5181 Encounter for therapeutic drug level monitoring: Secondary | ICD-10-CM | POA: Diagnosis not present

## 2015-12-11 DIAGNOSIS — Z79899 Other long term (current) drug therapy: Secondary | ICD-10-CM | POA: Diagnosis not present

## 2015-12-11 NOTE — Telephone Encounter (Signed)
Please advise on refill request as the patient has not been seen since 2015, but note says follow up in Dr Harrington Challenger will decide. Thanks, MI

## 2015-12-25 ENCOUNTER — Ambulatory Visit (INDEPENDENT_AMBULATORY_CARE_PROVIDER_SITE_OTHER): Payer: BLUE CROSS/BLUE SHIELD | Admitting: *Deleted

## 2015-12-25 DIAGNOSIS — I639 Cerebral infarction, unspecified: Secondary | ICD-10-CM | POA: Diagnosis not present

## 2015-12-25 NOTE — Progress Notes (Signed)
Carelink Summary Report / Loop Recorder 

## 2015-12-28 LAB — CUP PACEART REMOTE DEVICE CHECK
Implantable Pulse Generator Implant Date: 20141017
MDC IDC SESS DTM: 20171010083716

## 2015-12-28 NOTE — Progress Notes (Signed)
Carelink summary report received. Battery status OK. Normal device function. No new symptom episodes, tachy episodes, brady, or pause episodes. No new AF episodes. Monthly summary reports and ROV/PRN 

## 2016-01-14 DIAGNOSIS — M0589 Other rheumatoid arthritis with rheumatoid factor of multiple sites: Secondary | ICD-10-CM | POA: Diagnosis not present

## 2016-01-26 ENCOUNTER — Ambulatory Visit
Admission: RE | Admit: 2016-01-26 | Discharge: 2016-01-26 | Disposition: A | Discharge status: Home / Self Care | Payer: Medicare Other | Source: Ambulatory Visit | Attending: Surgery | Admitting: Surgery

## 2016-01-26 ENCOUNTER — Ambulatory Visit (INDEPENDENT_AMBULATORY_CARE_PROVIDER_SITE_OTHER): Payer: BLUE CROSS/BLUE SHIELD | Admitting: *Deleted

## 2016-01-26 DIAGNOSIS — I639 Cerebral infarction, unspecified: Secondary | ICD-10-CM

## 2016-01-26 DIAGNOSIS — IMO0001 Reserved for inherently not codable concepts without codable children: Secondary | ICD-10-CM

## 2016-01-26 DIAGNOSIS — I712 Thoracic aortic aneurysm, without rupture: Secondary | ICD-10-CM | POA: Diagnosis not present

## 2016-01-26 DIAGNOSIS — T82330D Leakage of aortic (bifurcation) graft (replacement), subsequent encounter: Secondary | ICD-10-CM

## 2016-01-26 MED ORDER — IOPAMIDOL (ISOVUE-370) INJECTION 76%
75.0000 mL | Freq: Once | INTRAVENOUS | Status: AC | PRN
  Administered 2016-01-26: 75 mL via INTRAVENOUS

## 2016-01-26 NOTE — Progress Notes (Signed)
Carelink Summary Report / Loop Recorder 

## 2016-02-01 ENCOUNTER — Other Ambulatory Visit: Payer: Self-pay | Admitting: Internal Medicine

## 2016-02-02 ENCOUNTER — Ambulatory Visit: Payer: BLUE CROSS/BLUE SHIELD | Admitting: Surgery

## 2016-02-02 NOTE — Telephone Encounter (Signed)
Please advise on refill request as patient has not been seen since 01/2014. Thanks, MI

## 2016-02-04 NOTE — Telephone Encounter (Signed)
Please refill.  Scheduling will contact patient for next available with Dr. Harrington Challenger.

## 2016-02-11 LAB — CUP PACEART REMOTE DEVICE CHECK
Implantable Pulse Generator Implant Date: 20141017
MDC IDC SESS DTM: 20171109093817

## 2016-02-12 DIAGNOSIS — M0589 Other rheumatoid arthritis with rheumatoid factor of multiple sites: Secondary | ICD-10-CM | POA: Diagnosis not present

## 2016-02-23 ENCOUNTER — Ambulatory Visit (INDEPENDENT_AMBULATORY_CARE_PROVIDER_SITE_OTHER): Payer: Medicare Other | Admitting: *Deleted

## 2016-02-23 DIAGNOSIS — I639 Cerebral infarction, unspecified: Secondary | ICD-10-CM

## 2016-02-23 NOTE — Progress Notes (Signed)
Carelink Summary Report / Loop Recorder 

## 2016-03-02 ENCOUNTER — Encounter: Payer: Self-pay | Admitting: Surgery

## 2016-03-08 ENCOUNTER — Ambulatory Visit: Payer: BLUE CROSS/BLUE SHIELD | Admitting: Surgery

## 2016-03-09 ENCOUNTER — Other Ambulatory Visit: Payer: Self-pay | Admitting: *Deleted

## 2016-03-09 MED ORDER — AMLODIPINE BESYLATE 10 MG PO TABS: 10.0000 mg | ORAL_TABLET | Freq: Every day | ORAL | 0 refills | Status: DC

## 2016-03-11 ENCOUNTER — Other Ambulatory Visit: Payer: Self-pay

## 2016-03-15 ENCOUNTER — Encounter: Payer: Self-pay | Admitting: Internal Medicine

## 2016-03-15 ENCOUNTER — Ambulatory Visit (INDEPENDENT_AMBULATORY_CARE_PROVIDER_SITE_OTHER): Payer: Medicare Other | Admitting: Internal Medicine

## 2016-03-15 VITALS — BP 132/78 | HR 81 | Ht 69.0 in | Wt 182.2 lb

## 2016-03-15 DIAGNOSIS — E7800 Pure hypercholesterolemia, unspecified: Secondary | ICD-10-CM | POA: Diagnosis not present

## 2016-03-15 DIAGNOSIS — I639 Cerebral infarction, unspecified: Secondary | ICD-10-CM

## 2016-03-15 DIAGNOSIS — I10 Essential (primary) hypertension: Secondary | ICD-10-CM

## 2016-03-15 NOTE — Progress Notes (Signed)
Cardiology Office Note   Date:  03/15/2016   ID:  Diana Walters, DOB March 15, 1955, MRN [id number]  PCP:  Renato Shin, MD  Cardiologist:   Dorris Carnes, MD   F/U of HTN      History of Present Illness: Diana Walters is a 61 y.o. female with a history of CVA  Loop recorder   TEE small FO   Also history of vasc dz with thoracic aneurysm for which she had stent repair   Pt with history of HTN   I saw her in Dec 2015    Breathing is pretty good  No CP   Says her bp has been running higher in past    Current Meds  Medication Sig  . amLODipine (NORVASC) 10 MG tablet Take 1 tablet (10 mg total) by mouth daily.  Marland Kitchen atorvastatin (LIPITOR) 40 MG tablet TAKE 1 TABLET BY MOUTH EVERY DAY  . clopidogrel (PLAVIX) 75 MG tablet TAKE 1 TABLET BY MOUTH EVERY DAY WITH BREAKFAST  . Cyanocobalamin 2500 MCG TABS Take 5,000 Units by mouth every morning.  . folic acid (FOLVITE) 1 MG tablet Take 1 mg by mouth daily.  Marland Kitchen HYDROcodone-acetaminophen (NORCO/VICODIN) 5-325 MG per tablet Take 1 tablet by mouth every 6 (six) hours as needed (pain).   . InFLIXimab (REMICADE IV) Inject into the vein. Due the 28th of October    At rheumatology  . levETIRAcetam (KEPPRA) 1000 MG tablet Take 1 tablet (1,000 mg total) by mouth 2 (two) times daily.  Marland Kitchen LYRICA 100 MG capsule Reported on 06/06/2015  . methotrexate 2.5 MG tablet Take 10 mg by mouth once a week.   . Multiple Vitamin (MULTIVITAMIN) tablet Take 1 tablet by mouth daily.    . predniSONE (DELTASONE) 5 MG tablet TAKE 1/2 TABLET BY MOUTH DAILY. (START AFTER PREDNISONE PACK)  . sulfaSALAzine (AZULFIDINE) 500 MG tablet Take 500 mg by mouth every 6 (six) hours.  Marland Kitchen tiZANidine (ZANAFLEX) 4 MG tablet Take 4 mg by mouth 4 (four) times daily. Reported on 06/06/2015  . traMADol (ULTRAM) 50 MG tablet Reported on 06/06/2015  . triamterene-hydrochlorothiazide (MAXZIDE-25) 37.5-25 MG tablet Take 0.5 tablets by mouth daily. Please call and schedule an appointment      Allergies:   Patient has no known allergies.   Past Medical History:  Diagnosis Date  . ANEMIA 10/17/2009  . ANXIETY 10/28/2006  . Arthritis   . ASTHMA 10/28/2006   no inhalers per pt  . Blood transfusion without reported diagnosis   . Depression   . Duodenitis without mention of hemorrhage 2005  . Heart murmur   . HYPERCHOLESTEROLEMIA 04/01/2008  . HYPERTENSION 10/28/2006  . Menopause    age 87  . Osteoporosis    bil knees  . SEIZURE DISORDER 04/01/2008  . Stroke ([place]) 11/2012  . 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 ESOPHAGOGASTRODUODENOSCOPY  01/29/2004  . 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 LOOP RECORDER IMPLANT N/A 12/01/2012   Procedure: LOOP RECORDER IMPLANT;  Surgeon: Coralyn Mark, MD;  Location: [facility] CATH LAB;  Service: Cardiovascular;  Laterality: N/A;  . TEE WITHOUT CARDIOVERSION N/A 12/01/2012   Procedure: TRANSESOPHAGEAL ECHOCARDIOGRAM (TEE);  Surgeon: Fay Records, MD;  Location: [facility] ENDOSCOPY;  Service: Cardiovascular;  Laterality: N/A;     Social History:  The patient  reports that she has been smoking Cigarettes.  She has a  1.20 pack-year smoking history. She has never used smokeless tobacco. She reports that she uses drugs, including Marijuana. She reports that she does not drink alcohol.   Family History:  The patient's family history includes Breast cancer in her mother; Heart disease in her mother.    ROS:  Please see the history of present illness. All other systems are reviewed and  Negative to the above problem except as noted.    PHYSICAL EXAM: VS:  BP 132/78   Pulse 81   Ht 5\' 9"  (1.753 m)   Wt 182 lb 3.2 oz (82.6 kg)   BMI 26.91 kg/m   On my check 180/80 GEN: Well nourished, well developed, in no acute distress  HEENT: normal  Neck: no JVD, carotid bruits, or masses Cardiac: RRR; Gr III/VI systolic murmur LUSB  No  rubs, or  gallops,no edema  Respiratory:  clear to auscultation bilaterally, normal work of breathing GI: soft, nontender, nondistended, + BS  No hepatomegaly  MS: no deformity Moving all extremities   Skin: warm and dry, no rash Neuro:  Strength and sensation are intact Psych: euthymic mood, full affect   EKG:  EKG is ordered today.  SR 81 bpm  LVH with repol abnormal  Biatrial abnormality     Lipid Panel    Component Value Date/Time   CHOL 175 12/20/2013 1016   TRIG 101.0 12/20/2013 1016   HDL 58.70 12/20/2013 1016   CHOLHDL 3 12/20/2013 1016   VLDL 20.2 12/20/2013 1016   LDLCALC 96 12/20/2013 1016   LDLDIRECT 74.3 11/27/2010 1058      Wt Readings from Last 3 Encounters:  03/15/16 182 lb 3.2 oz (82.6 kg)  06/06/15 180 lb 3.2 oz (81.7 kg)  01/27/15 182 lb 4.8 oz (82.7 kg)      ASSESSMENT AND PLAN:  1  HTN  BP good on arrival but on my check was 180/80  I have asked her to get a bp cuff and start making log  Bring to clinic    2  Thoracic aneurysm.  Pt with stent repair with endoleak  Being followed  I wil lreview echo and CT  This most likely explains murmur  3  HL  Check lipids today\  4  CVA  No further episdoes  Has Linq device  ON plavix  Hx small PFO   Check CBC and BMET  F/U in 4 wks     Current medicines are reviewed at length with the patient today.  The patient does not have concerns regarding medicines.  Signed, Dorris Carnes, MD  03/15/2016 10:19 AM    [facility] [place], [place], [place]  [address] Phone: [phone]; Fax: [phone]

## 2016-03-15 NOTE — Patient Instructions (Signed)
Your physician recommends that you continue on your current medications as directed. Please refer to the Current Medication list given to you today.  Your physician recommends that you return for lab work today (CBC, BMET, [place])  Your physician recommends that you schedule a follow-up appointment in: 4-6 [place].     BRING YOUR BLOOD PRESSURE CUFF AND LIST OF BLOOD PRESSURES WITH YOU TO YOUR NEXT APPOINTMENT.

## 2016-03-16 LAB — LIPID PANEL
CHOL/HDL RATIO: 4.2 ratio (ref 0.0–4.4)
Cholesterol, Total: 222 mg/dL — ABNORMAL HIGH (ref 100–199)
HDL: 53 mg/dL (ref >39)
LDL CALC: 145 mg/dL — AB (ref 0–99)
TRIGLYCERIDES: 119 mg/dL (ref 0–149)
VLDL Cholesterol Cal: 24 mg/dL (ref 5–40)

## 2016-03-16 LAB — CUP PACEART REMOTE DEVICE CHECK
Date Time Interrogation Session: 20171209093633
MDC IDC PG IMPLANT DT: 20141017

## 2016-03-16 LAB — BASIC METABOLIC PANEL
BUN/Creatinine Ratio: 13 (ref 12–28)
BUN: 21 mg/dL (ref 8–27)
CALCIUM: 9.3 mg/dL (ref 8.7–10.3)
CHLORIDE: 100 mmol/L (ref 96–106)
CO2: 23 mmol/L (ref 18–29)
Creatinine, Ser: 1.57 mg/dL — ABNORMAL HIGH (ref 0.57–1.00)
GFR calc Af Amer: 40 mL/min/{1.73_m2} — ABNORMAL LOW (ref >59)
GFR calc non Af Amer: 35 mL/min/{1.73_m2} — ABNORMAL LOW (ref >59)
GLUCOSE: 102 mg/dL — AB (ref 65–99)
Potassium: 4.3 mmol/L (ref 3.5–5.2)
Sodium: 140 mmol/L (ref 134–144)

## 2016-03-16 LAB — CBC
HEMATOCRIT: 35.5 % (ref 34.0–46.6)
HEMOGLOBIN: 10.9 g/dL — AB (ref 11.1–15.9)
MCH: 24.7 pg — ABNORMAL LOW (ref 26.6–33.0)
MCHC: 30.7 g/dL — ABNORMAL LOW (ref 31.5–35.7)
MCV: 81 fL (ref 79–97)
Platelets: 414 10*3/uL — ABNORMAL HIGH (ref 150–379)
RBC: 4.41 x10E6/uL (ref 3.77–5.28)
RDW: 17.5 % — ABNORMAL HIGH (ref 12.3–15.4)
WBC: 8.8 10*3/uL (ref 3.4–10.8)

## 2016-03-16 NOTE — Progress Notes (Signed)
Carelink summary report received. Battery status OK. Normal device function. No new symptom episodes, tachy episodes, brady, or pause episodes. No new AF episodes. Monthly summary reports and ROV/PRN 

## 2016-03-17 DIAGNOSIS — Z5181 Encounter for therapeutic drug level monitoring: Secondary | ICD-10-CM | POA: Diagnosis not present

## 2016-03-17 DIAGNOSIS — M0589 Other rheumatoid arthritis with rheumatoid factor of multiple sites: Secondary | ICD-10-CM | POA: Diagnosis not present

## 2016-03-17 DIAGNOSIS — M0580 Other rheumatoid arthritis with rheumatoid factor of unspecified site: Secondary | ICD-10-CM | POA: Diagnosis not present

## 2016-03-17 DIAGNOSIS — R5383 Other fatigue: Secondary | ICD-10-CM | POA: Diagnosis not present

## 2016-03-22 ENCOUNTER — Telehealth: Payer: Self-pay | Admitting: *Deleted

## 2016-03-22 DIAGNOSIS — E7800 Pure hypercholesterolemia, unspecified: Secondary | ICD-10-CM

## 2016-03-22 DIAGNOSIS — Z8673 Personal history of transient ischemic attack (TIA), and cerebral infarction without residual deficits: Secondary | ICD-10-CM

## 2016-03-22 DIAGNOSIS — I10 Essential (primary) hypertension: Secondary | ICD-10-CM

## 2016-03-22 NOTE — Telephone Encounter (Signed)
-----   Message from Fay Records, MD sent at 03/16/2016  2:29 PM EST ----- Kidney function appears a little down from 1 year ago Pt should be keeping log of BPs  Bring cuff in with her with log in 3 wks Repteat BMET 1 day prior

## 2016-03-22 NOTE — Telephone Encounter (Signed)
Spoke with patient. Reviewed her kidney function as well as other labs. Reviewed medications. Pt has not been taking Lipitor.  She does not have a prescription at home for it.     I will send new prescription for Lipitor.  Pt aware to pick up and begin ASAP.  Advised to take every evening.   Orders placed and appointment made for bmet, cbc on 3/2 and for lipids on 4/3.  She has appointment with Dr. Harrington Challenger and knows to bring her BP cuff and list of BPs with her on 04/19/16.  Pt appreciative for the information.

## 2016-03-22 NOTE — Telephone Encounter (Signed)
Notes Recorded by Fay Records, MD on 03/18/2016 at 10:56 AM EST Hgb is slightly low Get CBC with other labs in a few wks LDL is much higher than it was 2 years ago  Is she taking lipitor regularly (40 mg) If she is then I would add Zetia to regimen F/U lipids in 8 wks

## 2016-03-24 ENCOUNTER — Ambulatory Visit (INDEPENDENT_AMBULATORY_CARE_PROVIDER_SITE_OTHER): Payer: Medicare Other | Admitting: *Deleted

## 2016-03-24 DIAGNOSIS — I639 Cerebral infarction, unspecified: Secondary | ICD-10-CM

## 2016-03-24 NOTE — Progress Notes (Signed)
Carelink Summary Report / Loop Recorder 

## 2016-03-30 ENCOUNTER — Other Ambulatory Visit: Payer: Self-pay | Admitting: Internal Medicine

## 2016-03-30 MED ORDER — AMLODIPINE BESYLATE 10 MG PO TABS: 10.0000 mg | ORAL_TABLET | Freq: Every day | ORAL | 3 refills | Status: DC

## 2016-04-06 LAB — CUP PACEART REMOTE DEVICE CHECK
Date Time Interrogation Session: 20180108103638
Implantable Pulse Generator Implant Date: 20141017

## 2016-04-08 ENCOUNTER — Encounter: Payer: Self-pay | Admitting: Internal Medicine

## 2016-04-15 DIAGNOSIS — M0589 Other rheumatoid arthritis with rheumatoid factor of multiple sites: Secondary | ICD-10-CM | POA: Diagnosis not present

## 2016-04-16 ENCOUNTER — Encounter (INDEPENDENT_AMBULATORY_CARE_PROVIDER_SITE_OTHER): Payer: Self-pay

## 2016-04-16 ENCOUNTER — Other Ambulatory Visit: Payer: Medicare Other | Admitting: *Deleted

## 2016-04-16 DIAGNOSIS — I10 Essential (primary) hypertension: Secondary | ICD-10-CM

## 2016-04-16 DIAGNOSIS — Z8673 Personal history of transient ischemic attack (TIA), and cerebral infarction without residual deficits: Secondary | ICD-10-CM | POA: Diagnosis not present

## 2016-04-16 DIAGNOSIS — E7800 Pure hypercholesterolemia, unspecified: Secondary | ICD-10-CM | POA: Diagnosis not present

## 2016-04-16 LAB — BASIC METABOLIC PANEL
BUN / CREAT RATIO: 13 (ref 12–28)
BUN: 21 mg/dL (ref 8–27)
CALCIUM: 8.6 mg/dL — AB (ref 8.7–10.3)
CHLORIDE: 103 mmol/L (ref 96–106)
CO2: 21 mmol/L (ref 18–29)
Creatinine, Ser: 1.59 mg/dL — ABNORMAL HIGH (ref 0.57–1.00)
GFR, EST AFRICAN AMERICAN: 40 mL/min/{1.73_m2} — AB (ref >59)
GFR, EST NON AFRICAN AMERICAN: 35 mL/min/{1.73_m2} — AB (ref >59)
Glucose: 94 mg/dL (ref 65–99)
POTASSIUM: 4.3 mmol/L (ref 3.5–5.2)
SODIUM: 142 mmol/L (ref 134–144)

## 2016-04-16 LAB — CBC
HEMOGLOBIN: 9.7 g/dL — AB (ref 11.1–15.9)
Hematocrit: 30 % — ABNORMAL LOW (ref 34.0–46.6)
MCH: 24.7 pg — AB (ref 26.6–33.0)
MCHC: 32.3 g/dL (ref 31.5–35.7)
MCV: 77 fL — ABNORMAL LOW (ref 79–97)
PLATELETS: 415 10*3/uL — AB (ref 150–379)
RBC: 3.92 x10E6/uL (ref 3.77–5.28)
RDW: 16.7 % — AB (ref 12.3–15.4)
WBC: 9.2 10*3/uL (ref 3.4–10.8)

## 2016-04-18 LAB — CUP PACEART REMOTE DEVICE CHECK
MDC IDC PG IMPLANT DT: 20141017
MDC IDC SESS DTM: 20180207103709

## 2016-04-18 NOTE — Progress Notes (Signed)
Carelink summary report received. Battery status OK. Normal device function. No new symptom episodes, tachy episodes, brady, or pause episodes. No new AF episodes. Monthly summary reports and ROV/PRN 

## 2016-04-18 NOTE — Progress Notes (Signed)
Cardiology Office Note   Date:  04/19/2016   ID:  Diana Walters, DOB 1955-09-20, MRN [id number]  PCP:  Renato Shin, MD  Cardiologist:   Dorris Carnes, MD    F/U of HTN      History of Present Illness: Diana Walters is a 61 y.o. female with a history of CVA  TEE showed small PFO  Alos history of thoracic aneurysm, s/p stent repair  Pt has hasitory of HTN I saw her in Jan 2018  BP wa up on lasat visit  I recomm she keep a log    BP fluctuating at home  Has not been this high as it is today  Did not bring BP cuff or log  Since ssen she has been under increased stress  A close friend died      Current Meds  Medication Sig  . amLODipine (NORVASC) 10 MG tablet Take 1 tablet (10 mg total) by mouth daily.  Marland Kitchen atorvastatin (LIPITOR) 40 MG tablet TAKE 1 TABLET BY MOUTH EVERY DAY  . clopidogrel (PLAVIX) 75 MG tablet TAKE 1 TABLET BY MOUTH EVERY DAY WITH BREAKFAST  . Cyanocobalamin 2500 MCG TABS Take 5,000 Units by mouth every morning.  . folic acid (FOLVITE) 1 MG tablet Take 1 mg by mouth daily.  Marland Kitchen HYDROcodone-acetaminophen (NORCO/VICODIN) 5-325 MG per tablet Take 1 tablet by mouth every 6 (six) hours as needed (pain).   . InFLIXimab (REMICADE IV) Inject into the vein. Due the 28th of October    At rheumatology  . levETIRAcetam (KEPPRA) 1000 MG tablet Take 1 tablet (1,000 mg total) by mouth 2 (two) times daily.  Marland Kitchen LYRICA 100 MG capsule Reported on 06/06/2015  . methotrexate 2.5 MG tablet Take 10 mg by mouth once a week.   . Multiple Vitamin (MULTIVITAMIN) tablet Take 1 tablet by mouth daily.    . predniSONE (DELTASONE) 5 MG tablet TAKE 1/2 TABLET BY MOUTH DAILY. (START AFTER PREDNISONE PACK)  . sulfaSALAzine (AZULFIDINE) 500 MG tablet Take 500 mg by mouth every 6 (six) hours.  Marland Kitchen tiZANidine (ZANAFLEX) 4 MG tablet Take 4 mg by mouth 4 (four) times daily. Reported on 06/06/2015  . traMADol (ULTRAM) 50 MG tablet Reported on 06/06/2015  . triamterene-hydrochlorothiazide (MAXZIDE-25) 37.5-25 MG  tablet Take 0.5 tablets by mouth daily. Please call and schedule an appointment     Allergies:   Patient has no known allergies.   Past Medical History:  Diagnosis Date  . ANEMIA 10/17/2009  . ANXIETY 10/28/2006  . Arthritis   . ASTHMA 10/28/2006   no inhalers per pt  . Blood transfusion without reported diagnosis   . Depression   . Duodenitis without mention of hemorrhage 2005  . Heart murmur   . HYPERCHOLESTEROLEMIA 04/01/2008  . HYPERTENSION 10/28/2006  . Menopause    age 27  . Osteoporosis    bil knees  . SEIZURE DISORDER 04/01/2008  . Stroke ([place]) 11/2012  . 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 ESOPHAGOGASTRODUODENOSCOPY  01/29/2004  . 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 LOOP RECORDER IMPLANT N/A 12/01/2012   Procedure: LOOP RECORDER IMPLANT;  Surgeon: Coralyn Mark, MD;  Location: [facility] CATH LAB;  Service: Cardiovascular;  Laterality: N/A;  . TEE WITHOUT CARDIOVERSION N/A 12/01/2012   Procedure: TRANSESOPHAGEAL ECHOCARDIOGRAM (TEE);  Surgeon: Fay Records, MD;  Location: [facility];  Service: Cardiovascular;  Laterality:  N/A;     Social History:  The patient  reports that she has been smoking Cigarettes.  She has a 1.20 pack-year smoking history. She has never used smokeless tobacco. She reports that she uses drugs, including Marijuana. She reports that she does not drink alcohol.   Family History:  The patient's family history includes Breast cancer in her mother; Heart disease in her mother.    ROS:  Please see the history of present illness. All other systems are reviewed and  Negative to the above problem except as noted.    PHYSICAL EXAM: VS:  BP (!) 180/72 (BP Location: Right Arm)   Pulse 85   Ht 5\' 10"  (1.778 m)   Wt 189 lb (85.7 kg)   BMI 27.12 kg/m   GEN: Well nourished, well developed, in no acute distress  HEENT: normal  Neck: no JVD,  carotid bruits, or masses Cardiac: RRR; Gr II-III/VI systolic murmur base of heart  NO, rubs, or gallops,no edema  Respiratory:  clear to auscultation bilaterally, normal work of breathing GI: soft, nontender, nondistended, + BS  No hepatomegaly  MS: no deformity Moving all extremities   Skin: warm and dry, no rash Neuro:  Strength and sensation are intact Psych: euthymic mood, full affect   EKG:  EKG is not  ordered today   Lipid Panel    Component Value Date/Time   CHOL 222 (H) 03/15/2016 1057   TRIG 119 03/15/2016 1057   HDL 53 03/15/2016 1057   CHOLHDL 4.2 03/15/2016 1057   CHOLHDL 3 12/20/2013 1016   VLDL 20.2 12/20/2013 1016   LDLCALC 145 (H) 03/15/2016 1057   LDLDIRECT 74.3 11/27/2010 1058      Wt Readings from Last 3 Encounters:  04/19/16 189 lb (85.7 kg)  03/15/16 182 lb 3.2 oz (82.6 kg)  06/06/15 180 lb 3.2 oz (81.7 kg)      ASSESSMENT AND PLAN:  1  HTN  BP is high  Cr from last wk 1.5  Limits Rx  I need to get records from rheum cliinc (Dr Charlynne Cousins.  Considering hydralazine  2  HL  Start lipitor 40  She has not filled    3  CVA  Has LINQ  On plavix    4  S/p  AAA repair  Followed by [facility] Bali.  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  The patient does not have concerns regarding medicines.  Signed, Dorris Carnes, MD  04/19/2016 9:27 AM    [facility] [address], [place], [place]  [address] Phone: [phone]; Fax: [phone]

## 2016-04-19 ENCOUNTER — Encounter (INDEPENDENT_AMBULATORY_CARE_PROVIDER_SITE_OTHER): Payer: Self-pay

## 2016-04-19 ENCOUNTER — Ambulatory Visit (INDEPENDENT_AMBULATORY_CARE_PROVIDER_SITE_OTHER): Payer: Medicare Other | Admitting: Internal Medicine

## 2016-04-19 ENCOUNTER — Encounter: Payer: Self-pay | Admitting: Internal Medicine

## 2016-04-19 VITALS — BP 180/72 | HR 85 | Ht 70.0 in | Wt 189.0 lb

## 2016-04-19 DIAGNOSIS — I10 Essential (primary) hypertension: Secondary | ICD-10-CM

## 2016-04-19 DIAGNOSIS — I639 Cerebral infarction, unspecified: Secondary | ICD-10-CM | POA: Diagnosis not present

## 2016-04-19 DIAGNOSIS — E785 Hyperlipidemia, unspecified: Secondary | ICD-10-CM | POA: Diagnosis not present

## 2016-04-19 MED ORDER — ATORVASTATIN CALCIUM 40 MG PO TABS: 40.0000 mg | ORAL_TABLET | Freq: Every day | ORAL | 0 refills | Status: DC

## 2016-04-19 NOTE — Patient Instructions (Signed)
Your physician recommends that you continue on your current medications as directed. Please refer to the Current Medication list given to you today.  We will be contacting you regarding your blood pressure medications and follow up.

## 2016-04-23 ENCOUNTER — Ambulatory Visit (INDEPENDENT_AMBULATORY_CARE_PROVIDER_SITE_OTHER): Payer: Medicare Other | Admitting: *Deleted

## 2016-04-23 DIAGNOSIS — I639 Cerebral infarction, unspecified: Secondary | ICD-10-CM | POA: Diagnosis not present

## 2016-04-23 NOTE — Progress Notes (Signed)
Carelink Summary Report / Loop Recorder 

## 2016-05-05 LAB — CUP PACEART REMOTE DEVICE CHECK
Date Time Interrogation Session: 20180309103849
Implantable Pulse Generator Implant Date: 20141017

## 2016-05-07 ENCOUNTER — Telehealth: Payer: Self-pay | Admitting: *Deleted

## 2016-05-07 NOTE — Telephone Encounter (Signed)
Patient returned call. Advised patient update was successful and informed LINQ is at RRT since 04/01/2016. Advised patient on the next steps of scheduling OV with Dr. Rayann Heman to further discuss explant vs. leaving LINQ in place. Patient verbalized understanding and declined explant.   Advised patient a return kit would be sent to her house for [facility] home monitor. She verbalizes understanding.

## 2016-05-07 NOTE — Telephone Encounter (Signed)
Called Patient to send manual transmission for update requirement. Verbal instructions given to patient to help her send transmission. Patient states transmission sent successfully.  Patient inquired about battery. I informed her that this update will advise [place] if the LINQ is at true RRT and I will call her back to let her know and to advise her with the next steps. She further inquired, discussed the next steps of making an appointment with Dr. Rayann Heman to discuss explant or leaving it in. She verbalizes understanding and wants to think about it. Advised patient I would call back once I received her transmission to verify true RRT. Patient understood and stated she may be at the neighbors, but to leave a message and she would call me back.

## 2016-05-07 NOTE — Telephone Encounter (Signed)
LMOVM to advise patient Diana Walters is at RRT since April 02 2016.

## 2016-05-13 DIAGNOSIS — M0589 Other rheumatoid arthritis with rheumatoid factor of multiple sites: Secondary | ICD-10-CM | POA: Diagnosis not present

## 2016-05-18 ENCOUNTER — Other Ambulatory Visit: Payer: Medicare Other | Admitting: *Deleted

## 2016-05-18 DIAGNOSIS — E7800 Pure hypercholesterolemia, unspecified: Secondary | ICD-10-CM

## 2016-05-18 DIAGNOSIS — Z8673 Personal history of transient ischemic attack (TIA), and cerebral infarction without residual deficits: Secondary | ICD-10-CM | POA: Diagnosis not present

## 2016-05-18 DIAGNOSIS — I10 Essential (primary) hypertension: Secondary | ICD-10-CM

## 2016-05-18 LAB — LIPID PANEL
CHOLESTEROL TOTAL: 162 mg/dL (ref 100–199)
Chol/HDL Ratio: 3.4 ratio (ref 0.0–4.4)
HDL: 48 mg/dL (ref >39)
LDL Calculated: 95 mg/dL (ref 0–99)
Triglycerides: 97 mg/dL (ref 0–149)
VLDL Cholesterol Cal: 19 mg/dL (ref 5–40)

## 2016-05-20 ENCOUNTER — Other Ambulatory Visit: Payer: Self-pay | Admitting: *Deleted

## 2016-05-20 NOTE — Progress Notes (Signed)
I have not seen rheumatology records  Pt is on immunosuppressives  Need clinc note I would begin metoprolol 25 mg 3x per day   F?U in 6 wks in clinic for BP

## 2016-05-20 NOTE — Progress Notes (Signed)
error 

## 2016-05-21 NOTE — Progress Notes (Signed)
Requested ov notes from Dr. Sabino Niemann, [place] detailed message for Lattie Haw in medical records.

## 2016-05-24 ENCOUNTER — Ambulatory Visit (INDEPENDENT_AMBULATORY_CARE_PROVIDER_SITE_OTHER): Payer: Medicare Other | Admitting: *Deleted

## 2016-05-24 DIAGNOSIS — I639 Cerebral infarction, unspecified: Secondary | ICD-10-CM | POA: Diagnosis not present

## 2016-05-24 NOTE — Progress Notes (Signed)
Carelink Summary Report / Loop Recorder 

## 2016-05-26 LAB — CUP PACEART REMOTE DEVICE CHECK
Date Time Interrogation Session: 20180408113712
MDC IDC PG IMPLANT DT: 20141017

## 2016-06-01 NOTE — Progress Notes (Signed)
After review with pharmacy would try addition of Toprol XL 50 mg per day F/U in clinic with BP in 3 to 4 wks

## 2016-06-04 ENCOUNTER — Telehealth: Payer: Self-pay | Admitting: *Deleted

## 2016-06-04 MED ORDER — METOPROLOL SUCCINATE ER 50 MG PO TB24: 50.0000 mg | ORAL_TABLET | Freq: Every day | ORAL | 3 refills | Status: DC

## 2016-06-04 NOTE — Telephone Encounter (Signed)
Left message for patient to call back re: BP and new medication.    Notes per Dr. Harrington Challenger:    After review with pharmacy would try addition of Toprol XL 50 mg per day F/U in clinic with BP in 3 to 4 wks    Sent medication to [facility] pharmacy.  Will call patient back to review in detail and set up BP check.

## 2016-06-04 NOTE — Telephone Encounter (Signed)
-----  Message from Fay Records, MD sent at 06/01/2016 10:55 PM EDT -----   ----- Message ----- From: Erskine Emery, [facility] Sent: 05/31/2016  10:48 PM To: Fay Records, MD  Could consider selective beta blocker. It appears heart rate may tolerate. Scr has been stable for last few months could also consider restarting ACEi/ARB with recheck BMET in 2 weeks - generally losartan tends to be less potent on BP than other ARBs.    ----- Message ----- From: Fay Records, MD Sent: 05/26/2016   9:21 PM To: Fay Records, MD, Leeroy Bock, [facility]  Megan, Thoughts re antihypertensive addition  She is being treated for RA  Last ESR was high.  Not crazy about hydralaizine in case of reaction

## 2016-06-07 ENCOUNTER — Encounter: Payer: Self-pay | Admitting: Nurse Practitioner

## 2016-06-07 ENCOUNTER — Ambulatory Visit (INDEPENDENT_AMBULATORY_CARE_PROVIDER_SITE_OTHER): Payer: Medicare Other | Admitting: Nurse Practitioner

## 2016-06-07 VITALS — BP 180/80 | HR 71 | Resp 20 | Ht 70.0 in | Wt 191.0 lb

## 2016-06-07 DIAGNOSIS — Z8673 Personal history of transient ischemic attack (TIA), and cerebral infarction without residual deficits: Secondary | ICD-10-CM

## 2016-06-07 DIAGNOSIS — I10 Essential (primary) hypertension: Secondary | ICD-10-CM

## 2016-06-07 DIAGNOSIS — I639 Cerebral infarction, unspecified: Secondary | ICD-10-CM

## 2016-06-07 DIAGNOSIS — G40909 Epilepsy, unspecified, not intractable, without status epilepticus: Secondary | ICD-10-CM | POA: Diagnosis not present

## 2016-06-07 DIAGNOSIS — E7800 Pure hypercholesterolemia, unspecified: Secondary | ICD-10-CM | POA: Diagnosis not present

## 2016-06-07 DIAGNOSIS — R011 Cardiac murmur, unspecified: Secondary | ICD-10-CM

## 2016-06-07 MED ORDER — CLOPIDOGREL BISULFATE 75 MG PO TABS: ORAL_TABLET | ORAL | 11 refills | Status: DC

## 2016-06-07 MED ORDER — LEVETIRACETAM 1000 MG PO TABS: 1000.0000 mg | ORAL_TABLET | Freq: Two times a day (BID) | ORAL | 11 refills | Status: DC

## 2016-06-07 NOTE — Progress Notes (Signed)
[facility]  PATIENT: Diana Walters Walters DOB: 15-Dec-1955   REASON FOR VISIT: Follow-up for history of stroke, complex partial seizure disorder HISTORY FROM: Patient    HISTORY OF PRESENT ILLNESS:UPDATE 04/23/2018CM [name], 61 year old female returns for follow-up with a history of stroke in 2014. She remains on  Plavix for secondary stroke prevention without further stroke or TIA symptoms. She has no bruising and bleeding. In addition she has a history of seizure disorder and is currently on Keppra thousand milligrams twice daily. She has not had any seizure event in 4.5 years. She denies any side effects to the drug. She is also on amlodipine and Toprol for blood pressure control. She claims her Toprol was restarted and she took her first dose yesterday. Blood pressure in the office today 180/80. She is supposed to be keeping a record of her blood pressure and a log  to her primary care. She remains on Lipitor without complaints of myalgias,. Most recent cholesterol on 05/18/2016 was 222. LDL was 145. Her levels are to be checked again in 6 months since Lipitor was restarted Loop recorder has not detected any atrial fibrillation and she has to turn the machine back in. She denies any falls or balance issues. She continues to drive without difficulty. She returns for reevaluation UPDATE 06/06/2015 Diana Walters Diana Walters Walters, 61 year old female returns for follow-up. She has a history of stroke in 2014 when she had drooling on the left side of her mouth with dysarthria. Her dysarthria has resolved completely. She remains on Plavix for secondary stroke prevention. Carotid Doppler March 2016 was negative for significant stenosis of carotid arteries She denies any repeat stroke symptoms. Her blood pressure is 168/70. She has not taken her hypertensive medications today. She has a LINQ loop recorder which has not detected atrial fibrillation to date. She states she quit smoking 2 years ago.  Lipid  profile followed by PCP Dr. Loanne Drilling .She also has a history of remote seizures and is on Keppra. She has not had any seizure activity in 3.5 years. The last time she had seizure she had run out of her medication. She sees Dr. Lynnda Shields for pain management of her back pain. She returns for reevaluation.  12/29/12 (PS): She was admitted on 11/29/12 and when she woke up for drooling of the left side of her mouth and dysarthria. CT scan of the head showed right frontal opercular hypodensity an MRI scan confirmed acute nonhemorrhagic infarct involving the right insular cortex and operculum as well as multiple remote lacunar infarcts involving left pons and right cerebellum. There also multiple punctate areas of microhemorrhages scattered throughout the brain due to small vessel disease. Transthoracic echo showed normal ejection fraction. TEE showed a small PFO but no clot. Total cholesterol was 184, triglycerides 60, HDL 74 and LDL 97 mg percent. Hemoglobin and 1C was 5.8%. Urine drug screen was positive for marijuana. CT angiogram of the chest showed old para thoracic aorta with stent and graft but no endovascular leak. Carotid Doppler showed 1-39% bilateral ICA stenosis. Left vertebral artery flow is retrograde. She had remote history of seizures which was stable on Keppra. She is advised to continue Plavix because of her peripheral vascular disease and maintain strict control of hypertension and hyperlipidemia. She states she has done well since discharge and has not had stroke symptoms. She states her blood pressure is under good control though she forgot to take her medication this morning and her blood pressure was significantly elevated in office today  at 210/93.    REVIEW OF SYSTEMS: Full 14 system review of systems performed and notable only for those listed, all others are neg:  Constitutional: neg  Cardiovascular: neg Ear/Nose/Throat: neg  Skin: neg Eyes: neg Respiratory: neg Gastroitestinal: neg   Hematology/Lymphatic: neg Endocrine: neg Musculoskeletal: Joint pain joint swelling Allergy/Immunology: neg Neurological: neg Psychiatric: neg Sleep : neg   ALLERGIES: No Known Allergies  HOME MEDICATIONS: Outpatient Medications Prior to Visit  Medication Sig Dispense Refill  . amLODipine (NORVASC) 10 MG tablet Take 1 tablet (10 mg total) by mouth daily. 90 tablet 3  . atorvastatin (LIPITOR) 40 MG tablet Take 1 tablet (40 mg total) by mouth daily. 90 tablet 0  . clopidogrel (PLAVIX) 75 MG tablet TAKE 1 TABLET BY MOUTH EVERY DAY WITH BREAKFAST 30 tablet 11  . Cyanocobalamin 2500 MCG TABS Take 5,000 Units by mouth every morning.    . folic acid (FOLVITE) 1 MG tablet Take 1 mg by mouth daily.  3  . InFLIXimab (REMICADE IV) Inject into the vein. Due the 28th of October    At rheumatology    . levETIRAcetam (KEPPRA) 1000 MG tablet Take 1 tablet (1,000 mg total) by mouth 2 (two) times daily. 60 tablet 11  . LYRICA 100 MG capsule Reported on 06/06/2015  5  . methotrexate 2.5 MG tablet Take 10 mg by mouth once a week.     . metoprolol succinate (TOPROL-XL) 50 MG 24 hr tablet Take 1 tablet (50 mg total) by mouth daily. Take with or immediately following a meal. 90 tablet 3  . Multiple Vitamin (MULTIVITAMIN) tablet Take 1 tablet by mouth daily.      Marland Kitchen sulfaSALAzine (AZULFIDINE) 500 MG tablet Take 500 mg by mouth every 6 (six) hours.  3  . HYDROcodone-acetaminophen (NORCO/VICODIN) 5-325 MG per tablet Take 1 tablet by mouth every 6 (six) hours as needed (pain).     . predniSONE (DELTASONE) 5 MG tablet TAKE 1/2 TABLET BY MOUTH DAILY. (START AFTER PREDNISONE PACK)  2  . tiZANidine (ZANAFLEX) 4 MG tablet Take 4 mg by mouth 4 (four) times daily. Reported on 06/06/2015  5  . traMADol (ULTRAM) 50 MG tablet Reported on 06/06/2015  0  . triamterene-hydrochlorothiazide (MAXZIDE-25) 37.5-25 MG tablet Take 0.5 tablets by mouth daily. Please call and schedule an appointment 45 tablet 0   No  facility-administered medications prior to visit.     PAST MEDICAL HISTORY: Past Medical History:  Diagnosis Date  . ANEMIA 10/17/2009  . ANXIETY 10/28/2006  . Arthritis   . ASTHMA 10/28/2006   no inhalers per pt  . Blood transfusion without reported diagnosis   . Depression   . Duodenitis without mention of hemorrhage 2005  . Heart murmur   . HYPERCHOLESTEROLEMIA 04/01/2008  . HYPERTENSION 10/28/2006  . Menopause    age 27  . Osteoporosis    bil knees  . SEIZURE DISORDER 04/01/2008  . Stroke ([place]) 11/2012  . 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 ESOPHAGOGASTRODUODENOSCOPY  01/29/2004  . 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 LOOP RECORDER IMPLANT N/A 12/01/2012   Procedure: LOOP RECORDER IMPLANT;  Surgeon: Coralyn Mark, MD;  Location: [facility];  Service: Cardiovascular;  Laterality: N/A;  . TEE WITHOUT CARDIOVERSION N/A 12/01/2012   Procedure: TRANSESOPHAGEAL ECHOCARDIOGRAM (TEE);  Surgeon: Fay Records, MD;  Location: [facility];  Service:  Cardiovascular;  Laterality: N/A;    FAMILY HISTORY: Family History  Problem Relation Age of Onset  . Breast cancer Mother   . Heart disease Mother     before age 40  . Colon cancer Neg Hx   . Esophageal cancer Neg Hx   . Pancreatic cancer Neg Hx   . Rectal cancer Neg Hx   . Stomach cancer Neg Hx     SOCIAL HISTORY: Social History   Social History  . Marital status: Single    Spouse name: N/A  . Number of children: N/A  . Years of education: N/A   Occupational History  . Not on file.   Social History Main Topics  . Smoking status: Current Every Day Smoker    Packs/day: 0.10    Years: 12.00    Types: Cigarettes  . Smokeless tobacco: Never Used  . Alcohol use No  . Drug use: Yes    Types: Marijuana  . Sexual activity: Not on file   Other Topics Concern  . Not on file   Social  History Narrative  . No narrative on file     PHYSICAL EXAM  Vitals:   06/07/16 0739  BP: (!) 180/80   Pulse: 71  Resp: 20  Weight: 191 lb (86.6 kg)  Height: 5\' 10"  (1.778 m)   Body mass index is 27.41 kg/m.   Generalized: Well developed, in no acute distress  Head: normocephalic and atraumatic,. Oropharynx benign  Neck: Supple, no carotid bruits  Cardiac: Regular rate rhythm,  4/6 systolic murmur  Musculoskeletal: No deformity   Neurological examination   Mentation: Alert oriented to time, place, history taking. Attention span and concentration appropriate. Recent and remote memory intact.  Follows all commands speech and language fluent.  Cranial nerve II-XII: Pupils were equal round reactive to light extraocular movements were full, visual field were full on confrontational test. Facial sensation and strength were normal. hearing was intact to finger rubbing bilaterally. Uvula tongue midline. head turning and shoulder shrug were normal and symmetric.Tongue protrusion into cheek strength was normal. Motor: normal bulk and tone, full strength in the BUE, BLE, fine finger movements normal, no pronator drift. No focal weakness Sensory: normal and symmetric to light touch, in the upper and lower extremities Coordination: finger-nose-finger, heel-to-shin bilaterally, no dysmetria Reflexes: 1+ upper lower and symmetric, plantar responses were flexor bilaterally. Gait and Station: Rising up from seated position without assistance, normal stance,  moderate stride, good arm swing, smooth turning, able to perform tiptoe, and heel walking without difficulty. Tandem gait is mildly unsteady. No assistive device    DIAGNOSTIC DATA (LABS, IMAGING, TESTING)  ASSESSMENT AND PLAN 61 y.o. year old female has a past medical history of HYPERCHOLESTEROLEMIA (04/01/2008); HYPERTENSION (10/28/2006); THORACIC AORTIC ANEURYSM (04/01/2008); SEIZURE DISORDER (04/01/2008); Heart murmur; and history of  stroke in 2014.   PLAN Continue Keppra at current dose, will refill for 1 year Continue Plavix at current dose  Will refill for 1 year keep LDL less than 100 total cholesterol less than 200 monitored by PCP.  05/18/2016 total cholesterol was 222. LDL was 145. Lipitor has been restarted Continue to exercise regularly maintain healthy body weight  F/U in one year I spent 15 min  in total face to face time with the patient more than 50% of which was spent counseling and coordination of care, reviewing test results reviewing medications and discussing and reviewing the diagnosis of stroke and importance of monitoring risk factors and further treatment options. Izora Gala  Cecille Rubin, [facility], [facility], APRN  [facility] [address], [place], [place] [address] [phone]

## 2016-06-07 NOTE — Patient Instructions (Signed)
Continue Keppra at current dose, will refill for 1 year Continue Plavix at current dose  Will refill for 1 year keep LDL less than 100 total cholesterol less than 200 monitored by PCP Continue to exercise regularly maintain healthy body weight  F/U in one year

## 2016-06-07 NOTE — Progress Notes (Signed)
I agree with the above plan 

## 2016-06-07 NOTE — Telephone Encounter (Signed)
Called patient and confirmed she picked up her medication and started yesterday.  Clarified that she should continue her other medications as well.  appt scheduled with htn clinic.

## 2016-06-10 DIAGNOSIS — M0589 Other rheumatoid arthritis with rheumatoid factor of multiple sites: Secondary | ICD-10-CM | POA: Diagnosis not present

## 2016-06-13 ENCOUNTER — Other Ambulatory Visit: Payer: Self-pay | Admitting: Nurse Practitioner

## 2016-06-25 ENCOUNTER — Other Ambulatory Visit: Payer: Self-pay | Admitting: Nurse Practitioner

## 2016-07-01 ENCOUNTER — Ambulatory Visit: Payer: Medicare Other | Admitting: Pharmacist

## 2016-07-08 DIAGNOSIS — M0589 Other rheumatoid arthritis with rheumatoid factor of multiple sites: Secondary | ICD-10-CM | POA: Diagnosis not present

## 2016-07-17 ENCOUNTER — Other Ambulatory Visit: Payer: Self-pay | Admitting: Internal Medicine

## 2016-08-05 DIAGNOSIS — M0589 Other rheumatoid arthritis with rheumatoid factor of multiple sites: Secondary | ICD-10-CM | POA: Diagnosis not present

## 2016-08-13 DIAGNOSIS — G40909 Epilepsy, unspecified, not intractable, without status epilepticus: Secondary | ICD-10-CM | POA: Diagnosis not present

## 2016-08-13 DIAGNOSIS — N183 Chronic kidney disease, stage 3 (moderate): Secondary | ICD-10-CM | POA: Diagnosis not present

## 2016-08-13 DIAGNOSIS — Z720 Tobacco use: Secondary | ICD-10-CM | POA: Diagnosis not present

## 2016-08-13 DIAGNOSIS — R809 Proteinuria, unspecified: Secondary | ICD-10-CM | POA: Diagnosis not present

## 2016-08-13 DIAGNOSIS — I129 Hypertensive chronic kidney disease with stage 1 through stage 4 chronic kidney disease, or unspecified chronic kidney disease: Secondary | ICD-10-CM | POA: Diagnosis not present

## 2016-08-13 DIAGNOSIS — Z9889 Other specified postprocedural states: Secondary | ICD-10-CM | POA: Diagnosis not present

## 2016-08-13 DIAGNOSIS — D649 Anemia, unspecified: Secondary | ICD-10-CM | POA: Diagnosis not present

## 2016-08-13 DIAGNOSIS — E785 Hyperlipidemia, unspecified: Secondary | ICD-10-CM | POA: Diagnosis not present

## 2016-08-13 DIAGNOSIS — Z6833 Body mass index (BMI) 33.0-33.9, adult: Secondary | ICD-10-CM | POA: Diagnosis not present

## 2016-08-17 ENCOUNTER — Other Ambulatory Visit: Payer: Self-pay | Admitting: Nephrology

## 2016-08-17 DIAGNOSIS — N183 Chronic kidney disease, stage 3 unspecified: Secondary | ICD-10-CM

## 2016-08-26 ENCOUNTER — Telehealth: Payer: Self-pay | Admitting: Nurse Practitioner

## 2016-08-26 NOTE — Telephone Encounter (Signed)
CM/NP saw pt 06-07-16.  Please advise.

## 2016-08-26 NOTE — Telephone Encounter (Signed)
Arville Go called for Dr Lorrene Reid asking for approval for pt to stop Plavix for a week before a renal biopsy and 2 weeks after(due to bleeding) Please call Dr Lorrene Reid back at [phone]

## 2016-08-26 NOTE — Telephone Encounter (Signed)
I spoke to Dr. Lorrene Reid. Patient has not had a TIA or stroke for several months now. She needs an urgent kidney biopsy and agree with holding Plavix for 5 days before the procedure. Dr. Lorrene Reid is reluctant to start Plavix until 2 weeks after the procedure due to risk of delayed bleeding in some patients I suggest she start a baby aspirin 1 week after, the procedure and then switch  to Plavix and she thinks it is safe with understanding the patient does carry a small but acceptable periprocedural risk of TIA/stroke when she is off antiplatelet agents and if she is still willing she can proceed with the above plan

## 2016-09-01 ENCOUNTER — Ambulatory Visit
Admission: RE | Admit: 2016-09-01 | Discharge: 2016-09-01 | Disposition: A | Discharge status: Home / Self Care | Payer: Medicare Other | Source: Ambulatory Visit | Attending: Nephrology | Admitting: Nephrology

## 2016-09-01 DIAGNOSIS — N183 Chronic kidney disease, stage 3 unspecified: Secondary | ICD-10-CM

## 2016-09-01 DIAGNOSIS — N281 Cyst of kidney, acquired: Secondary | ICD-10-CM | POA: Diagnosis not present

## 2016-09-01 DIAGNOSIS — I10 Essential (primary) hypertension: Secondary | ICD-10-CM | POA: Diagnosis not present

## 2016-09-03 ENCOUNTER — Other Ambulatory Visit (HOSPITAL_COMMUNITY): Payer: Self-pay | Admitting: *Deleted

## 2016-09-03 DIAGNOSIS — M0589 Other rheumatoid arthritis with rheumatoid factor of multiple sites: Secondary | ICD-10-CM | POA: Diagnosis not present

## 2016-09-06 ENCOUNTER — Ambulatory Visit (HOSPITAL_COMMUNITY)
Admission: RE | Admit: 2016-09-06 | Discharge: 2016-09-06 | Disposition: A | Discharge status: Home / Self Care | Payer: Medicare Other | Source: Ambulatory Visit | Attending: Nephrology | Admitting: Nephrology

## 2016-09-06 ENCOUNTER — Other Ambulatory Visit (HOSPITAL_COMMUNITY): Payer: Self-pay | Admitting: Nephrology

## 2016-09-06 DIAGNOSIS — D631 Anemia in chronic kidney disease: Secondary | ICD-10-CM | POA: Insufficient documentation

## 2016-09-06 DIAGNOSIS — N183 Chronic kidney disease, stage 3 (moderate): Secondary | ICD-10-CM | POA: Insufficient documentation

## 2016-09-06 DIAGNOSIS — R809 Proteinuria, unspecified: Secondary | ICD-10-CM

## 2016-09-06 LAB — POCT HEMOGLOBIN-HEMACUE: HEMOGLOBIN: 9.4 g/dL — AB (ref 12.0–15.0)

## 2016-09-06 MED ORDER — DARBEPOETIN ALFA 100 MCG/0.5ML IJ SOSY
PREFILLED_SYRINGE | INTRAMUSCULAR | Status: AC
  Filled 2016-09-06: qty 0.5

## 2016-09-06 MED ORDER — DARBEPOETIN ALFA 100 MCG/0.5ML IJ SOSY
100.0000 ug | PREFILLED_SYRINGE | INTRAMUSCULAR | Status: DC
  Administered 2016-09-06: 14:00:00 100 ug via SUBCUTANEOUS

## 2016-09-06 NOTE — Discharge Instructions (Signed)

## 2016-09-13 ENCOUNTER — Other Ambulatory Visit: Payer: Self-pay | Admitting: General Surgery

## 2016-09-14 ENCOUNTER — Other Ambulatory Visit: Payer: Self-pay

## 2016-09-14 ENCOUNTER — Encounter (HOSPITAL_COMMUNITY): Payer: Self-pay

## 2016-09-14 ENCOUNTER — Ambulatory Visit (HOSPITAL_COMMUNITY)
Admission: RE | Admit: 2016-09-14 | Discharge: 2016-09-14 | Disposition: A | Discharge status: Home / Self Care | Payer: Medicare Other | Source: Ambulatory Visit | Attending: Nephrology | Admitting: Nephrology

## 2016-09-14 ENCOUNTER — Emergency Department (HOSPITAL_COMMUNITY)
Admission: EM | Admit: 2016-09-14 | Discharge: 2016-09-14 | Disposition: A | Discharge status: Home / Self Care | Payer: Medicare Other | Attending: Emergency Medicine | Admitting: Emergency Medicine

## 2016-09-14 ENCOUNTER — Encounter (HOSPITAL_COMMUNITY): Payer: Self-pay | Admitting: Emergency Medicine

## 2016-09-14 ENCOUNTER — Encounter: Payer: Self-pay | Admitting: Physician Assistant

## 2016-09-14 DIAGNOSIS — I10 Essential (primary) hypertension: Secondary | ICD-10-CM | POA: Insufficient documentation

## 2016-09-14 DIAGNOSIS — R001 Bradycardia, unspecified: Secondary | ICD-10-CM | POA: Diagnosis not present

## 2016-09-14 DIAGNOSIS — F17210 Nicotine dependence, cigarettes, uncomplicated: Secondary | ICD-10-CM | POA: Insufficient documentation

## 2016-09-14 DIAGNOSIS — E7800 Pure hypercholesterolemia, unspecified: Secondary | ICD-10-CM | POA: Diagnosis not present

## 2016-09-14 DIAGNOSIS — J45909 Unspecified asthma, uncomplicated: Secondary | ICD-10-CM | POA: Insufficient documentation

## 2016-09-14 DIAGNOSIS — R42 Dizziness and giddiness: Secondary | ICD-10-CM | POA: Diagnosis not present

## 2016-09-14 DIAGNOSIS — Z7982 Long term (current) use of aspirin: Secondary | ICD-10-CM | POA: Diagnosis not present

## 2016-09-14 DIAGNOSIS — R809 Proteinuria, unspecified: Secondary | ICD-10-CM

## 2016-09-14 DIAGNOSIS — Z8673 Personal history of transient ischemic attack (TIA), and cerebral infarction without residual deficits: Secondary | ICD-10-CM | POA: Insufficient documentation

## 2016-09-14 DIAGNOSIS — Z79899 Other long term (current) drug therapy: Secondary | ICD-10-CM | POA: Insufficient documentation

## 2016-09-14 DIAGNOSIS — I440 Atrioventricular block, first degree: Secondary | ICD-10-CM

## 2016-09-14 LAB — CBC
HCT: 27 % — ABNORMAL LOW (ref 36.0–46.0)
HEMATOCRIT: 31.4 % — AB (ref 36.0–46.0)
HEMOGLOBIN: 8.8 g/dL — AB (ref 12.0–15.0)
HEMOGLOBIN: 10.1 g/dL — AB (ref 12.0–15.0)
MCH: 23.8 pg — AB (ref 26.0–34.0)
MCH: 23.4 pg — AB (ref 26.0–34.0)
MCHC: 32.6 g/dL (ref 30.0–36.0)
MCHC: 32.2 g/dL (ref 30.0–36.0)
MCV: 73 fL — ABNORMAL LOW (ref 78.0–100.0)
MCV: 72.7 fL — AB (ref 78.0–100.0)
PLATELETS: 369 10*3/uL (ref 150–400)
Platelets: 362 10*3/uL (ref 150–400)
RBC: 3.7 MIL/uL — ABNORMAL LOW (ref 3.87–5.11)
RBC: 4.32 MIL/uL (ref 3.87–5.11)
RDW: 19.2 % — AB (ref 11.5–15.5)
RDW: 18.9 % — ABNORMAL HIGH (ref 11.5–15.5)
WBC: 7.5 10*3/uL (ref 4.0–10.5)
WBC: 8.1 10*3/uL (ref 4.0–10.5)

## 2016-09-14 LAB — URINALYSIS, ROUTINE W REFLEX MICROSCOPIC
BACTERIA UA: NONE SEEN
Bilirubin Urine: NEGATIVE
Glucose, UA: NEGATIVE mg/dL
KETONES UR: NEGATIVE mg/dL
LEUKOCYTES UA: NEGATIVE
Nitrite: NEGATIVE
PROTEIN: 100 mg/dL — AB
Specific Gravity, Urine: 1.008 (ref 1.005–1.030)
pH: 6 (ref 5.0–8.0)

## 2016-09-14 LAB — BASIC METABOLIC PANEL
Anion gap: 11 (ref 5–15)
BUN: 30 mg/dL — ABNORMAL HIGH (ref 6–20)
CO2: 20 mmol/L — AB (ref 22–32)
CREATININE: 1.85 mg/dL — AB (ref 0.44–1.00)
Calcium: 9 mg/dL (ref 8.9–10.3)
Chloride: 110 mmol/L (ref 101–111)
GFR calc non Af Amer: 28 mL/min — ABNORMAL LOW (ref >60)
GFR, EST AFRICAN AMERICAN: 33 mL/min — AB (ref >60)
Glucose, Bld: 99 mg/dL (ref 65–99)
Potassium: 4 mmol/L (ref 3.5–5.1)
Sodium: 141 mmol/L (ref 135–145)

## 2016-09-14 LAB — APTT: APTT: 41 s — AB (ref 24–36)

## 2016-09-14 LAB — TROPONIN I: Troponin I: <0.03 ng/mL (ref <0.03)

## 2016-09-14 LAB — PROTIME-INR
INR: 1.19
PROTHROMBIN TIME: 15.2 s (ref 11.4–15.2)

## 2016-09-14 LAB — CBG MONITORING, ED: GLUCOSE-CAPILLARY: 108 mg/dL — AB (ref 65–99)

## 2016-09-14 MED ORDER — HYDRALAZINE HCL 20 MG/ML IJ SOLN
INTRAMUSCULAR | Status: AC
  Filled 2016-09-14: qty 1

## 2016-09-14 MED ORDER — HYDRALAZINE HCL 20 MG/ML IJ SOLN
10.0000 mg | Freq: Once | INTRAMUSCULAR | Status: AC
  Administered 2016-09-14: 10 mg via INTRAVENOUS

## 2016-09-14 MED ORDER — SODIUM CHLORIDE 0.9 % IV SOLN: INTRAVENOUS | Status: DC

## 2016-09-14 MED ORDER — SODIUM CHLORIDE 0.9 % IV BOLUS (SEPSIS)
1000.0000 mL | Freq: Once | INTRAVENOUS | Status: AC
  Administered 2016-09-14: 1000 mL via INTRAVENOUS

## 2016-09-14 MED ORDER — METOPROLOL SUCCINATE ER 25 MG PO TB24: 25.0000 mg | ORAL_TABLET | Freq: Every day | ORAL | 0 refills | Status: DC

## 2016-09-14 MED ORDER — HYDRALAZINE HCL 25 MG PO TABS: 25.0000 mg | ORAL_TABLET | Freq: Three times a day (TID) | ORAL | 0 refills | Status: DC

## 2016-09-14 NOTE — Sedation Documentation (Addendum)
Pt returned from [place] stated feels woozy, voided large amount, BP ok, Felt better once back in bed.  Saverio Danker in.  Dr Laurence Ferrari updated.  States felt in usual state of health this am.

## 2016-09-14 NOTE — Discharge Instructions (Addendum)

## 2016-09-14 NOTE — Discharge Instructions (Signed)
Please read and follow all provided instructions.  Your diagnoses today include:  1. Bradycardia   2. Lightheadedness     Tests performed today include:  An EKG of your heart   A chest x-ray  Cardiac enzymes - a blood test for heart muscle damage  Blood counts and electrolytes  Vital signs. See below for your results today.   Medications prescribed:   Hydralazine - Medication for blood pressure  Start taking this 3 times a day.   Metoprolol - medication for blood pressure  Do not take this medicine on August 1. Start taking the new dose, 25 mg, on August 2.  Take any prescribed medications only as directed.  Follow-up instructions: Please follow-up with your primary care provider as soon as you can for further evaluation of your symptoms.   Return instructions:  SEEK IMMEDIATE MEDICAL ATTENTION IF:  You have severe chest pain, especially if the pain is crushing or pressure-like and spreads to the arms, back, neck, or jaw, or if you have sweating, nausea (feeling sick to your stomach), or shortness of breath. THIS IS AN EMERGENCY. Don't wait to see if the pain will go away. Get medical help at once. Call 911 or [phone] (operator). DO NOT drive yourself to the hospital.   Your chest pain gets worse and does not go away with rest.   You have an attack of chest pain lasting longer than usual, despite rest and treatment with the medications your caregiver has prescribed.   You wake from sleep with chest pain or shortness of breath.  You feel dizzy or faint.  You have chest pain not typical of your usual pain for which you originally saw your caregiver.   You have any other emergent concerns regarding your health.  Additional Information: Chest pain comes from many different causes. Your caregiver has diagnosed you as having chest pain that is not specific for one problem, but does not require admission.  You are at low risk for an acute heart condition or other serious  illness.   Your vital signs today were: BP (!) 177/57    Pulse (!) 47    Temp 97.7 F (36.5 C) (Tympanic)    Resp 16    Ht 5\' 10"  (1.778 m)    Wt 80.3 kg (177 lb)    SpO2 100%    BMI 25.40 kg/m  If your blood pressure (BP) was elevated above 135/85 this visit, please have this repeated by your doctor within one month. --------------

## 2016-09-14 NOTE — ED Triage Notes (Addendum)
Came to the hospital for renal biopsy. While getting ready for procedure patient BP elevated 200/63 and HR 44. Given hydralazine 10 mg BP 187/66. EKG indicated 1st degree block. Patient denies chest pain or shortness of breath does have general weakness. States maybe got up to fast. Patient transferred from radiology to ED. Alert answering and following commands appropriate.

## 2016-09-14 NOTE — ED Notes (Signed)
Patient aware she needs to get a urine sample

## 2016-09-14 NOTE — ED Notes (Signed)
Pt ambulates in hall with steady gait per n orm. Sat remains 100% throughout and hr 58-64.

## 2016-09-14 NOTE — Sedation Documentation (Addendum)
Saverio Danker PA in to evaluate pt, BP and HR noted, spoke with Dr Laurence Ferrari, hydralazine and EKG ordered.  Did take BP meds last night including metoprolol.

## 2016-09-14 NOTE — ED Notes (Signed)
Pt states she understands instruxtions and repedats information on medication as directed. Home stable via wc with grand daughrter.

## 2016-09-14 NOTE — Sedation Documentation (Signed)
Dr Laurence Ferrari in, he spoke with cardiologist, send to ER.  Charge RN called, report given.

## 2016-09-14 NOTE — ED Provider Notes (Signed)
[facility] DEPT Provider Note   CSN: [id number] Arrival date & time: 09/14/16  0905     History   Chief Complaint Chief Complaint  Patient presents with  . Bradycardia    HPI Diana Walters is a 61 y.o. female.  Patient with history of seizure disorder on Keppra, previous stroke on Plavix, rheumatoid arthritis on Remicade and methotrexate, previous surgery for thoracic aortic aneurysm -- presents from interventional radiology this morning where she was due to have a renal biopsy performed. Patient was noted to be bradycardic in the 40s upon arrival and hypertensive. Patient got up to use the restroom and felt lightheaded. Her procedure was canceled and she was sent to the emergency department after discussion with cardiology. Patient states that she has not had anything to eat or drink since midnight and she was unable to sleep at all last night due to anxiety over today's procedure. No chest pain or shortness of breath today or in the recent past. No bleeding symptoms reported. Patient does not report having history of low heart rate. No history of coronary artery disease or heart attack. The onset of this condition was acute. The course is resolved. Aggravating factors: none. Alleviating factors: none.        Past Medical History:  Diagnosis Date  . ANEMIA 10/17/2009  . ANXIETY 10/28/2006  . Arthritis   . ASTHMA 10/28/2006   no inhalers per pt  . Blood transfusion without reported diagnosis   . Depression   . Duodenitis without mention of hemorrhage 2005  . Heart murmur   . HYPERCHOLESTEROLEMIA 04/01/2008  . HYPERTENSION 10/28/2006  . Menopause    age 80  . Osteoporosis    bil knees  . SEIZURE DISORDER 04/01/2008  . Stroke ([place]) 11/2012  . THORACIC AORTIC ANEURYSM 04/01/2008    Patient Active Problem List   Diagnosis Date Noted  . Seizure disorder ([place]) 06/07/2016  . Hypertensive cardiomegaly 11/03/2013  . History of stroke 11/03/2013  . Heart murmur, systolic  40/98/1191  . Encounter for screening colonoscopy 09/12/2013  . Goiter, simple 07/06/2013  . Routine general medical examination at a health care facility 07/06/2013  . Acute CVA (cerebrovascular accident) ([place]) 11/29/2012  . Iron deficiency anemia, unspecified 06/16/2012  . Encounter for long-term (current) use of other medications 05/17/2012  . Arthralgia 05/17/2012  . Menopause 11/27/2010  . Hematuria 11/27/2010  . KNEE PAIN, LEFT 02/18/2010  . HYPERCHOLESTEROLEMIA 04/01/2008  . 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 Convulsions ([place]) 04/01/2008  . C O P D 05/29/2007  . PRURITUS 05/25/2007  . ANXIETY 10/28/2006  . Essential hypertension 10/28/2006  . ASTHMA 10/28/2006  . Rheumatoid arthritis(714.0) 10/28/2006    Past Surgical History:  Procedure Laterality Date  . ESOPHAGOGASTRODUODENOSCOPY  01/29/2004  . 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 LOOP RECORDER IMPLANT N/A 12/01/2012   Procedure: LOOP RECORDER IMPLANT;  Surgeon: Coralyn Mark, MD;  Location: [facility] CATH LAB;  Service: Cardiovascular;  Laterality: N/A;  . TEE WITHOUT CARDIOVERSION N/A 12/01/2012   Procedure: TRANSESOPHAGEAL ECHOCARDIOGRAM (TEE);  Surgeon: Fay Records, MD;  Location: [facility] ENDOSCOPY;  Service: Cardiovascular;  Laterality: N/A;    OB History    No data available       Home Medications    Prior to Admission medications   Medication Sig Start Date End Date Taking? Authorizing Provider  amLODipine (NORVASC) 10 MG tablet Take 1 tablet (10 mg total) by mouth  daily. 03/30/16   Fay Records, MD  aspirin EC 81 MG tablet Take 81 mg by mouth daily.    [provider]  atorvastatin (LIPITOR) 40 MG tablet TAKE 1 TABLET BY MOUTH EVERY DAY 07/19/16   Fay Records, MD  clopidogrel (PLAVIX) 75 MG tablet TAKE 1 TABLET BY MOUTH EVERY DAY WITH BREAKFAST 06/07/16   Dennie Bible, NP  diphenhydrAMINE (BENADRYL) 25 mg capsule Take 25 mg by mouth  daily as needed for allergies (TAKES THE DAY OF INFUSION).    [provider]  InFLIXimab (REMICADE IV) Inject into the vein. At rheumatology    [provider]  levETIRAcetam (KEPPRA) 1000 MG tablet Take 1 tablet (1,000 mg total) by mouth 2 (two) times daily. 06/07/16   Dennie Bible, NP  methotrexate 2.5 MG tablet Take 7.5 mg by mouth once a week. Friday    [provider]  metoprolol succinate (TOPROL-XL) 50 MG 24 hr tablet Take 1 tablet (50 mg total) by mouth daily. Take with or immediately following a meal. 06/04/16   Fay Records, MD  Multiple Vitamin (MULTIVITAMIN) tablet Take 1 tablet by mouth daily.      [provider]  triamterene-hydrochlorothiazide (MAXZIDE-25) 37.5-25 MG tablet Take 0.5 tablets by mouth daily.    [provider]    Family History Family History  Problem Relation Age of Onset  . Breast cancer Mother   . Heart disease Mother        before age 30  . Colon cancer Neg Hx   . Esophageal cancer Neg Hx   . Pancreatic cancer Neg Hx   . Rectal cancer Neg Hx   . Stomach cancer Neg Hx     Social History Social History  Substance Use Topics  . Smoking status: Current Every Day Smoker    Packs/day: 0.10    Years: 12.00    Types: Cigarettes  . Smokeless tobacco: Never Used  . Alcohol use 0.0 oz/week     Allergies   Patient has no known allergies.   Review of Systems Review of Systems  Constitutional: Negative for diaphoresis and fever.  Eyes: Negative for redness.  Respiratory: Negative for cough and shortness of breath.   Cardiovascular: Negative for chest pain, palpitations and leg swelling.  Gastrointestinal: Negative for abdominal pain, nausea and vomiting.  Genitourinary: Negative for dysuria.  Musculoskeletal: Negative for back pain and neck pain.  Skin: Negative for rash.  Neurological: Positive for light-headedness. Negative for syncope.  Psychiatric/Behavioral: The patient is nervous/anxious.       Physical Exam Updated Vital Signs BP (!) 191/62   Pulse (!) 50   Temp 97.7 F (36.5 C) (Tympanic)   Resp 16   Ht 5\' 10"  (1.778 m)   Wt 80.3 kg (177 lb)   SpO2 100%   BMI 25.40 kg/m   Physical Exam  Constitutional: She appears well-developed and well-nourished.  HENT:  Head: Normocephalic and atraumatic.  Mouth/Throat: Oropharynx is clear and moist.  Eyes: Conjunctivae are normal. Right eye exhibits no discharge. Left eye exhibits no discharge.  Neck: Normal range of motion. Neck supple.  Cardiovascular: Normal rate and regular rhythm.   Murmur (4/6 systolic murmur) heard. Pulmonary/Chest: Effort normal and breath sounds normal.  Abdominal: Soft. There is no tenderness.  Neurological: She is alert.  Skin: Skin is warm and dry.  Psychiatric: She has a normal mood and affect.  Nursing note and vitals reviewed.    ED Treatments / Results  Labs (all labs ordered are listed, but only abnormal results are displayed) Labs Reviewed  BASIC METABOLIC PANEL - Abnormal; Notable for the following:       Result Value   CO2 20 (*)    BUN 30 (*)    Creatinine, Ser 1.85 (*)    GFR calc non Af Amer 28 (*)    GFR calc Af Amer 33 (*)    All other components within normal limits  CBC - Abnormal; Notable for the following:    Hemoglobin 10.1 (*)    HCT 31.4 (*)    MCV 72.7 (*)    MCH 23.4 (*)    RDW 18.9 (*)    All other components within normal limits  URINALYSIS, ROUTINE W REFLEX MICROSCOPIC - Abnormal; Notable for the following:    Color, Urine STRAW (*)    Hgb urine dipstick SMALL (*)    Protein, ur 100 (*)    Squamous Epithelial / LPF 0-5 (*)    All other components within normal limits  CBG MONITORING, ED - Abnormal; Notable for the following:    Glucose-Capillary 108 (*)    All other components within normal limits  TROPONIN I    EKG  EKG Interpretation  Date/Time:  Tuesday September 14 2016 09:16:16 EDT Ventricular Rate:  58 PR Interval:    QRS  Duration: 107 QT Interval:  499 QTC Calculation: 491 R Axis:   31 Text Interpretation:  Sinus rhythm Prolonged PR interval Left atrial enlargement Left ventricular hypertrophy Borderline prolonged QT interval Baseline wander in lead(s) I II aVR V1 no significant change compared to earlier in the day Confirmed by Sherwood Gambler [phone]) on 09/14/2016 9:23:38 AM       Radiology No results found.  Procedures Procedures (including critical care time)  Medications Ordered in ED Medications  sodium chloride 0.9 % bolus 1,000 mL (1,000 mLs Intravenous New Bag/Given 09/14/16 1012)     Initial Impression / Assessment and Plan / ED Course  I have reviewed the triage vital signs and the nursing notes.  Pertinent labs & imaging results that were available during my care of the patient were reviewed by me and considered in my medical decision making (see chart for details).     Patient seen and examined. Work-up initiated.  Vital signs reviewed and are as follows: BP (!) 191/62   Pulse (!) 50   Temp 97.7 F (36.5 C) (Tympanic)   Resp 16   Ht 5\' 10"  (1.778 m)   Wt 80.3 kg (177 lb)   SpO2 100%   BMI 25.40 kg/m   1:48 PM workup reassuring. Patient discussed with Dr. Regenia Skeeter.   Discussed medication regimen with cardiology, Dr. Meda Coffee. Will have patient hold metoprolol tomorrow and restart at half dose on 8/2. Will start hydralazine 25 mg 3 times a day. Patient will follow-up in clinic in the next 1-2 weeks.  Plan discussed with patient at bedside. Patient provided written instructions as well.  Will discharge to home. Encouraged rest and good hydration today and tomorrow. Return to the emergency department with chest pain, shortness of breath, worsening lightheadedness or if she passes out. Patient verbalizes understanding and agrees with plan.  Final Clinical Impressions(s) / ED Diagnoses   Final diagnoses:  Bradycardia  Lightheadedness   Bradycardia/lightheadedness: Patient  with new first-degree AV block and more pronounced bradycardia noted today. Possibly causing episode of lightheadedness while procedure. Of note, patient was NPO and did not sleep last night. Lab workup is reassuring.  No profound anemia today. Troponin is negative. Patient does not have chest pain or shortness of breath. She was given IV hydration in emergency department and is doing well. Discussed plan with cardiology as above. Patient to follow-up. No indications for admission at this time.  New Prescriptions New Prescriptions   HYDRALAZINE (APRESOLINE) 25 MG TABLET    Take 1 tablet (25 mg total) by mouth 3 (three) times daily.     Carlisle Cater, PA-C 09/14/16 1351    Sherwood Gambler, MD 09/14/16 [phone]

## 2016-09-14 NOTE — H&P (Signed)
Chief Complaint: proteinuria  Referring Physician:Dr. Jamal Maes  Supervising Physician: Jacqulynn Cadet  Patient Status: [facility] - Out-pt  HPI: Diana Walters is a 61 y.o. female who is followed by Dr. Lorrene Reid for proteinuria.  This was found by her rheumatologist at first and then sent to nephrology.  She presents today for this biopsy.  She denies any complaints of CP, SOB, fevers, chills.  She is very sleepy though and she doesn't have a great explanation for this.  She states she has been awake for the last 2 days, but not sure why.  She intermittently falls back asleep and has to be aroused during our conversation.  She admits to taking her medications last night, but is also a little unreliable on if this is consistent or not.    Past Medical History:  Past Medical History:  Diagnosis Date  . ANEMIA 10/17/2009  . ANXIETY 10/28/2006  . Arthritis   . ASTHMA 10/28/2006   no inhalers per pt  . Blood transfusion without reported diagnosis   . Depression   . Duodenitis without mention of hemorrhage 2005  . Heart murmur   . HYPERCHOLESTEROLEMIA 04/01/2008  . HYPERTENSION 10/28/2006  . Menopause    age 89  . Osteoporosis    bil knees  . SEIZURE DISORDER 04/01/2008  . Stroke ([place]) 11/2012  . THORACIC AORTIC ANEURYSM 04/01/2008    Past Surgical History:  Past Surgical History:  Procedure Laterality Date  . ESOPHAGOGASTRODUODENOSCOPY  01/29/2004  . 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 LOOP RECORDER IMPLANT N/A 12/01/2012   Procedure: LOOP RECORDER IMPLANT;  Surgeon: Coralyn Mark, MD;  Location: [facility] CATH LAB;  Service: Cardiovascular;  Laterality: N/A;  . TEE WITHOUT CARDIOVERSION N/A 12/01/2012   Procedure: TRANSESOPHAGEAL ECHOCARDIOGRAM (TEE);  Surgeon: Fay Records, MD;  Location: [facility] ENDOSCOPY;  Service: Cardiovascular;  Laterality: N/A;    Family History:  Family History  Problem Relation Age of Onset  . Breast  cancer Mother   . Heart disease Mother        before age 54  . Colon cancer Neg Hx   . Esophageal cancer Neg Hx   . Pancreatic cancer Neg Hx   . Rectal cancer Neg Hx   . Stomach cancer Neg Hx     Social History:  reports that she has been smoking Cigarettes.  She has a 1.20 pack-year smoking history. She has never used smokeless tobacco. She reports that she uses drugs, including Marijuana. She reports that she does not drink alcohol.  Allergies: No Known Allergies  Medications: Medications reviewed in epic  Please HPI for pertinent positives, otherwise complete 10 system ROS negative.  Mallampati Score: MD Evaluation Airway: WNL Heart: Other (comments) Heart  comments: bradycardic Abdomen: WNL Chest/ Lungs: WNL ASA  Classification: 3 Mallampati/Airway Score: Three  Physical Exam: BP (!) 178/58   Pulse (!) 43   Temp 97.9 F (36.6 C) (Oral)   Resp 16   Ht 5\' 10"  (1.778 m)   Wt 174 lb (78.9 kg)   SpO2 100%   BMI 24.97 kg/m  Body mass index is 24.97 kg/m. General: WD, WN black female who is laying in bed in NAD, but is sleepy HEENT: head is normocephalic, atraumatic.  Sclera are noninjected.  PERRL.  Ears and nose without any masses or lesions.  Mouth is pink and moist Heart: bradycardic.  Normal s1,s2. +murmur No obvious gallops, or  rubs noted.  Palpable radial pulses bilaterally Lungs: CTAB, no wheezes, rhonchi, or rales noted.  Respiratory effort nonlabored Abd: soft, NT, ND, +BS, no masses, hernias, or organomegaly Psych: alert but sleepy as well, oriented x3 with an appropriate affect, but is somewhat unreliable in her history currently.   Labs: Results for orders placed or performed during the hospital encounter of 09/14/16 (from the past 48 hour(s))  APTT upon arrival     Status: Abnormal   Collection Time: 09/14/16  6:06 AM  Result Value Ref Range   aPTT 41 (H) 24 - 36 seconds    Comment:        IF BASELINE aPTT IS ELEVATED, SUGGEST PATIENT RISK  ASSESSMENT BE USED TO DETERMINE APPROPRIATE ANTICOAGULANT THERAPY.   CBC upon arrival     Status: Abnormal   Collection Time: 09/14/16  6:06 AM  Result Value Ref Range   WBC 7.5 4.0 - 10.5 K/uL   RBC 3.70 (L) 3.87 - 5.11 MIL/uL   Hemoglobin 8.8 (L) 12.0 - 15.0 g/dL   HCT 27.0 (L) 36.0 - 46.0 %   MCV 73.0 (L) 78.0 - 100.0 fL   MCH 23.8 (L) 26.0 - 34.0 pg   MCHC 32.6 30.0 - 36.0 g/dL   RDW 19.2 (H) 11.5 - 15.5 %   Platelets 362 150 - 400 K/uL  Protime-INR upon arrival     Status: None   Collection Time: 09/14/16  6:06 AM  Result Value Ref Range   Prothrombin Time 15.2 11.4 - 15.2 seconds   INR 1.19     Imaging: No results found.  Assessment/Plan 1. Proteinuria We will plan to delay her procedure today as when she stood up to use the restroom she became symptomatic with her bradycardia and got woozy.  This was d/w on call cardiology and they recommended due to all these new changes that she should be sent to the ED and they can await the call to evaluate her.    2. Bradycardia The patient is having new onset bradycardia.  We obtained an EKG which revealed new onset bradycardia and 1st degree AV block.  Her last EKG early this year showed NSR with no evidence of heart block.  She does take Keppra which can cause some PR prolongation, but should not cause bradycardia and this is new as she has been on keppra for a long time. I will send a message to her cardiologist to inform him of this new finding.  3. Hypertension She states she took all of her anti-HTN last night at 2330; however, her initial BP was 200/63.  She was given 10mg  of hydralazine which did bring her BP down some to 163/60s.    She did state when she got up to go to the restroom that she got a little "woozy" but this got better after she returned to bed.  Her BP had returned to 180s/60s.  Please see above that states the patient is going to the ED based off of becoming symptomatic from her bradycardia and overall, the  patient is off of her baseline as far as we can tell.  Thank you for this interesting consult.  I greatly enjoyed meeting Diana Walters and look forward to participating in their care.  A copy of this report was sent to the requesting provider on this date.  Electronically Signed: Henreitta Cea 09/14/2016, 8:08 AM   I spent a total of  60 Minutes   in face to face in clinical  consultation, greater than 50% of which was counseling/coordinating care for proteinuria, new bradycardia, 1st heart block, hypertension

## 2016-09-14 NOTE — Progress Notes (Signed)
Dr. Meda Coffee requests 1-2 week follow-up for this patient. I have sent a message to our [facility] office's scheduler requesting a follow-up appointment, and our office will call the patient with this information.  Fahima Cifelli PA-C

## 2016-10-04 ENCOUNTER — Encounter (HOSPITAL_COMMUNITY): Payer: Medicare Other

## 2016-10-06 ENCOUNTER — Ambulatory Visit: Payer: Medicare Other | Admitting: Physician Assistant

## 2016-10-06 NOTE — Progress Notes (Deleted)
Cardiology Office Note    Date:  10/06/2016   ID:  Deshunda, Thackston [birth year], MRN [id number]  PCP:  Renato Shin, MD  Cardiologist:  Dr. Harrington Challenger  Chief Complaint: Hospital follow up s/p    /  Months follow up  History of Present Illness:   Diana Walters is a 61 y.o. female with hx of CVA (TEE with small PFO), has ILR, thoracic aneurysm s/p stent repair, HLD and HTN presented for hospital follow up.    Past Medical History:  Diagnosis Date  . ANEMIA 10/17/2009  . ANXIETY 10/28/2006  . Arthritis   . ASTHMA 10/28/2006   no inhalers per pt  . Blood transfusion without reported diagnosis   . Depression   . Duodenitis without mention of hemorrhage 2005  . Heart murmur   . HYPERCHOLESTEROLEMIA 04/01/2008  . HYPERTENSION 10/28/2006  . Menopause    age 25  . Osteoporosis    bil knees  . SEIZURE DISORDER 04/01/2008  . Stroke ([place]) 11/2012  . 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 ESOPHAGOGASTRODUODENOSCOPY  01/29/2004  . 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 LOOP RECORDER IMPLANT N/A 12/01/2012   Procedure: LOOP RECORDER IMPLANT;  Surgeon: Coralyn Mark, MD;  Location: [place] [facility] CATH LAB;  Service: Cardiovascular;  Laterality: N/A;  . TEE WITHOUT CARDIOVERSION N/A 12/01/2012   Procedure: TRANSESOPHAGEAL ECHOCARDIOGRAM (TEE);  Surgeon: Fay Records, MD;  Location: [facility] ENDOSCOPY;  Service: Cardiovascular;  Laterality: N/A;    Current Medications: Prior to Admission medications   Medication Sig Start Date End Date Taking? Authorizing Provider  amLODipine (NORVASC) 10 MG tablet Take 1 tablet (10 mg total) by mouth daily. 03/30/16   Fay Records, MD  aspirin EC 81 MG tablet Take 81 mg by mouth daily.    [provider]  atorvastatin (LIPITOR) 40 MG tablet TAKE 1 TABLET BY MOUTH EVERY DAY 07/19/16   Fay Records, MD  clopidogrel (PLAVIX) 75 MG tablet TAKE 1 TABLET BY  MOUTH EVERY DAY WITH BREAKFAST 06/07/16   Dennie Bible, NP  diphenhydrAMINE (BENADRYL) 25 mg capsule Take 25 mg by mouth daily as needed for allergies (TAKES THE DAY OF INFUSION).    [provider]  hydrALAZINE (APRESOLINE) 25 MG tablet Take 1 tablet (25 mg total) by mouth 3 (three) times daily. 09/14/16   Carlisle Cater, PA-C  InFLIXimab (REMICADE IV) Inject into the vein. At rheumatology    [provider]  levETIRAcetam (KEPPRA) 1000 MG tablet Take 1 tablet (1,000 mg total) by mouth 2 (two) times daily. 06/07/16   Dennie Bible, NP  methotrexate 2.5 MG tablet Take 7.5 mg by mouth once a week. Friday    [provider]  metoprolol succinate (TOPROL-XL) 25 MG 24 hr tablet Take 1 tablet (25 mg total) by mouth daily. Take with or immediately following a meal. 09/14/16   Carlisle Cater, PA-C  Multiple Vitamin (MULTIVITAMIN) tablet Take 1 tablet by mouth daily.      [provider]  triamterene-hydrochlorothiazide (MAXZIDE-25) 37.5-25 MG tablet Take 0.5 tablets by mouth daily.    [provider]    Allergies:   Patient has no known allergies.   Social History   Social History  . Marital status: Single    Spouse name: N/A  . Number of children: N/A  . Years of education: N/A  Social History Main Topics  . Smoking status: Current Every Day Smoker    Packs/day: 0.10    Years: 12.00    Types: Cigarettes  . Smokeless tobacco: Never Used  . Alcohol use 0.0 oz/week  . Drug use: Yes    Types: Marijuana  . Sexual activity: Not on file   Other Topics Concern  . Not on file   Social History Narrative  . No narrative on file     Family History:  The patient's family history includes Breast cancer in her mother; Heart disease in her mother. ***  ROS:   Please see the history of present illness.    ROS All other systems reviewed and are negative.   PHYSICAL EXAM:   VS:  There were no vitals taken for this visit.   GEN: Well  nourished, well developed, in no acute distress  HEENT: normal  Neck: no JVD, carotid bruits, or masses Cardiac: ***RRR; no murmurs, rubs, or gallops,no edema  Respiratory:  clear to auscultation bilaterally, normal work of breathing GI: soft, nontender, nondistended, + BS MS: no deformity or atrophy  Skin: warm and dry, no rash Neuro:  Alert and Oriented x 3, Strength and sensation are intact Psych: euthymic mood, full affect  Wt Readings from Last 3 Encounters:  09/14/16 177 lb (80.3 kg)  09/14/16 174 lb (78.9 kg)  06/07/16 191 lb (86.6 kg)      Studies/Labs Reviewed:   EKG:  EKG is ordered today.  The ekg ordered today demonstrates ***  Recent Labs: 09/14/2016: BUN 30; Creatinine, Ser 1.85; Hemoglobin 10.1; Platelets 369; Potassium 4.0; Sodium 141   Lipid Panel    Component Value Date/Time   CHOL 162 05/18/2016 0859   TRIG 97 05/18/2016 0859   HDL 48 05/18/2016 0859   CHOLHDL 3.4 05/18/2016 0859   CHOLHDL 3 12/20/2013 1016   VLDL 20.2 12/20/2013 1016   [place] 95 05/18/2016 0859   LDLDIRECT 74.3 11/27/2010 1058    Additional studies/ records that were reviewed today include:   Echocardiogram:  Cardiac Catheterization:     ASSESSMENT & PLAN:    1. HTN  2. HLD  3. AAA s/p repair - Followed by Dr. Trula Slade  4. CVA s/p ILR    Medication Adjustments/Labs and Tests Ordered: Current medicines are reviewed at length with the patient today.  Concerns regarding medicines are outlined above.  Medication changes, Labs and Tests ordered today are listed in the Patient Instructions below. There are no Patient Instructions on file for this visit.   Jarrett Soho, [place]  10/06/2016 10:27 AM    [facility] [place], [place], [place]  [address] Phone: [phone]; Fax: [phone]

## 2016-10-11 ENCOUNTER — Encounter (HOSPITAL_COMMUNITY): Payer: Medicare Other

## 2016-10-21 NOTE — Progress Notes (Signed)
Cardiology Office Note    Date:  10/22/2016   ID:  COLIN NORMENT, DOB Dec 04, 1955, MRN [id number]  PCP:  Renato Shin, MD  Cardiologist:  Dr. Harrington Challenger  Chief Complaint: ER follow up for bradycardia   History of Present Illness:   Diana Walters is a 61 y.o. female with hx of CVA (TEE with small PFO), has ILR, thoracic aneurysm s/p stent repair, HLD and HTN presented for ER follow up.   On 09/14/16 had lightheaded prior to her renal biopsy. It was cancelled and send to ER where she noted to be bradycardic in 40s and hypertensive @ 191/60. Dr. Meda Coffee recommended to reduce metoprolol to half dose 25mg  qd and start hydralazine 25mg  TID.   Here today for follow up. Patient denies chest pain, palpitation, orthopnea, PND, syncope, lower extremity edema or melena. She is unsure of dose  metoprolol. Does not check her blood pressure. She can not remember name of her kidney doctor.   Past Medical History:  Diagnosis Date  . ANEMIA 10/17/2009  . ANXIETY 10/28/2006  . Arthritis   . ASTHMA 10/28/2006   no inhalers per pt  . Blood transfusion without reported diagnosis   . Depression   . Duodenitis without mention of hemorrhage 2005  . Heart murmur   . HYPERCHOLESTEROLEMIA 04/01/2008  . HYPERTENSION 10/28/2006  . Menopause    age 89  . Osteoporosis    bil knees  . SEIZURE DISORDER 04/01/2008  . Stroke ([place]) 11/2012  . 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 ESOPHAGOGASTRODUODENOSCOPY  01/29/2004  . 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 LOOP RECORDER IMPLANT N/A 12/01/2012   Procedure: LOOP RECORDER IMPLANT;  Surgeon: Coralyn Mark, MD;  Location: [facility] CATH LAB;  Service: Cardiovascular;  Laterality: N/A;  . TEE WITHOUT CARDIOVERSION N/A 12/01/2012   Procedure: TRANSESOPHAGEAL ECHOCARDIOGRAM (TEE);  Surgeon: Fay Records, MD;  Location: [facility] ENDOSCOPY;  Service: Cardiovascular;   Laterality: N/A;    Current Medications: Prior to Admission medications   Medication Sig Start Date End Date Taking? Authorizing Provider  amLODipine (NORVASC) 10 MG tablet Take 1 tablet (10 mg total) by mouth daily. 03/30/16   Fay Records, MD  aspirin EC 81 MG tablet Take 81 mg by mouth daily.    [provider]  atorvastatin (LIPITOR) 40 MG tablet TAKE 1 TABLET BY MOUTH EVERY DAY 07/19/16   Fay Records, MD  clopidogrel (PLAVIX) 75 MG tablet TAKE 1 TABLET BY MOUTH EVERY DAY WITH BREAKFAST 06/07/16   Dennie Bible, NP  diphenhydrAMINE (BENADRYL) 25 mg capsule Take 25 mg by mouth daily as needed for allergies (TAKES THE DAY OF INFUSION).    [provider]  hydrALAZINE (APRESOLINE) 25 MG tablet Take 1 tablet (25 mg total) by mouth 3 (three) times daily. 09/14/16   Carlisle Cater, PA-C  InFLIXimab (REMICADE IV) Inject into the vein. At rheumatology    [provider]  levETIRAcetam (KEPPRA) 1000 MG tablet Take 1 tablet (1,000 mg total) by mouth 2 (two) times daily. 06/07/16   Dennie Bible, NP  methotrexate 2.5 MG tablet Take 7.5 mg by mouth once a week. Friday    [provider]  metoprolol succinate (TOPROL-XL) 25 MG 24 hr tablet Take 1 tablet (25 mg total) by mouth daily. Take with or immediately following a meal. 09/14/16   Carlisle Cater, PA-C  Multiple Vitamin (MULTIVITAMIN) tablet Take 1 tablet by mouth daily.      [provider]  triamterene-hydrochlorothiazide (MAXZIDE-25) 37.5-25 MG tablet Take 0.5 tablets by mouth daily.    [provider]    Allergies:   Patient has no known allergies.   Social History   Social History  . Marital status: Single    Spouse name: N/A  . Number of children: N/A  . Years of education: N/A   Social History Main Topics  . Smoking status: Current Every Day Smoker    Packs/day: 0.10    Years: 12.00    Types: Cigarettes  . Smokeless tobacco: Never Used  . Alcohol use 0.0 oz/week    . Drug use: Yes    Types: Marijuana  . Sexual activity: Not Asked   Other Topics Concern  . None   Social History Narrative  . None     Family History:  The patient's family history includes Breast cancer in her mother; Heart disease in her mother.   ROS:   Please see the history of present illness.    ROS All other systems reviewed and are negative.   PHYSICAL EXAM:   VS:  BP (!) 195/92   Pulse 77   Ht 5\' 10"  (1.778 m)   Wt 163 lb 6.4 oz (74.1 kg)   SpO2 98%   BMI 23.45 kg/m    GEN: Well nourished, well developed, in no acute distress  HEENT: normal  Neck: no JVD, carotid bruits, or masses Cardiac: RRR; no murmurs, rubs, or gallops,no edema  Respiratory:  clear to auscultation bilaterally, normal work of breathing GI: soft, nontender, nondistended, + BS MS: no deformity or atrophy  Skin: warm and dry, no rash Neuro:  Alert and Oriented x 3, Strength and sensation are intact Psych: euthymic mood, full affect  Wt Readings from Last 3 Encounters:  10/22/16 163 lb 6.4 oz (74.1 kg)  09/14/16 177 lb (80.3 kg)  09/14/16 174 lb (78.9 kg)      Studies/Labs Reviewed:   EKG:  EKG is ordered today.    Recent Labs: 09/14/2016: BUN 30; Creatinine, Ser 1.85; Hemoglobin 10.1; Platelets 369; Potassium 4.0; Sodium 141   Lipid Panel    Component Value Date/Time   CHOL 162 05/18/2016 0859   TRIG 97 05/18/2016 0859   HDL 48 05/18/2016 0859   CHOLHDL 3.4 05/18/2016 0859   CHOLHDL 3 12/20/2013 1016   VLDL 20.2 12/20/2013 1016   LDLCALC 95 05/18/2016 0859   LDLDIRECT 74.3 11/27/2010 1058    Additional studies/ records that were reviewed today include:   As above  ASSESSMENT & PLAN:    1. HTN - Elevated today to 195/92. Increase Hydralazine to 75 mg 3 times a day. Continue amlodipine 10 mg and Triamterent/HTCZ at 1/2 dose of 37.5/25. Continue current dose of Toprol XL (patient does not remember).   2. HLD - Continue statin   3. AAA s/p repair - Followed by Dr.  Trula Slade  4. CVA s/p ILR - patient states that no longer in remote checking as she has turned in device and keep keep chip in. On ASA and Plavix.   5. Bradycardia/lightheadedness - No reoccurrence since reduce BB dose?Marland Kitchen Rate of 77 today. Advise to continue current dose of Toprol XL. She will bring all medication during follow up. She is cleared for renal biopsy.    Reviewed with Dr. Harrington Challenger.   Medication Adjustments/Labs and Tests Ordered: Current medicines are reviewed at length with the  patient today.  Concerns regarding medicines are outlined above.  Medication changes, Labs and Tests ordered today are listed in the Patient Instructions below. There are no Patient Instructions on file for this visit.   Jarrett Soho, [place]  10/22/2016 1:39 PM    [facility] [address], [place], [place]  [address] Phone: [phone]; Fax: [phone]

## 2016-10-22 ENCOUNTER — Encounter: Payer: Self-pay | Admitting: Physician Assistant

## 2016-10-22 ENCOUNTER — Encounter (INDEPENDENT_AMBULATORY_CARE_PROVIDER_SITE_OTHER): Payer: Self-pay

## 2016-10-22 ENCOUNTER — Ambulatory Visit (INDEPENDENT_AMBULATORY_CARE_PROVIDER_SITE_OTHER): Payer: Medicare Other | Admitting: Physician Assistant

## 2016-10-22 VITALS — BP 195/92 | HR 77 | Ht 70.0 in | Wt 163.4 lb

## 2016-10-22 DIAGNOSIS — I714 Abdominal aortic aneurysm, without rupture, unspecified: Secondary | ICD-10-CM

## 2016-10-22 DIAGNOSIS — E785 Hyperlipidemia, unspecified: Secondary | ICD-10-CM | POA: Diagnosis not present

## 2016-10-22 DIAGNOSIS — I10 Essential (primary) hypertension: Secondary | ICD-10-CM

## 2016-10-22 DIAGNOSIS — I639 Cerebral infarction, unspecified: Secondary | ICD-10-CM | POA: Diagnosis not present

## 2016-10-22 DIAGNOSIS — E7800 Pure hypercholesterolemia, unspecified: Secondary | ICD-10-CM

## 2016-10-22 MED ORDER — HYDRALAZINE HCL 50 MG PO TABS: 75.0000 mg | ORAL_TABLET | Freq: Three times a day (TID) | ORAL | 6 refills | Status: DC

## 2016-10-22 NOTE — Patient Instructions (Addendum)
Your physician has recommended you make the following change in your medication:  Increase hydralazine to 75 mg  Three times a  Day   BRING ALL MEDICINE BOTTLES TO NEXT  APPOINTMENT  Your physician recommends that you schedule a follow-up appointment in:  [place]

## 2016-11-10 ENCOUNTER — Other Ambulatory Visit (HOSPITAL_COMMUNITY): Payer: Self-pay | Admitting: Nephrology

## 2016-11-10 DIAGNOSIS — N183 Chronic kidney disease, stage 3 unspecified: Secondary | ICD-10-CM

## 2016-11-10 DIAGNOSIS — D649 Anemia, unspecified: Secondary | ICD-10-CM

## 2016-11-10 DIAGNOSIS — R809 Proteinuria, unspecified: Secondary | ICD-10-CM

## 2016-12-03 ENCOUNTER — Encounter: Payer: Self-pay | Admitting: Internal Medicine

## 2016-12-20 ENCOUNTER — Encounter: Payer: Self-pay | Admitting: Internal Medicine

## 2016-12-20 ENCOUNTER — Ambulatory Visit (INDEPENDENT_AMBULATORY_CARE_PROVIDER_SITE_OTHER): Payer: Medicare Other | Admitting: Internal Medicine

## 2016-12-20 VITALS — BP 174/82 | HR 77 | Ht 70.0 in | Wt 158.4 lb

## 2016-12-20 DIAGNOSIS — I639 Cerebral infarction, unspecified: Secondary | ICD-10-CM | POA: Diagnosis not present

## 2016-12-20 DIAGNOSIS — I714 Abdominal aortic aneurysm, without rupture, unspecified: Secondary | ICD-10-CM

## 2016-12-20 DIAGNOSIS — R011 Cardiac murmur, unspecified: Secondary | ICD-10-CM

## 2016-12-20 DIAGNOSIS — I10 Essential (primary) hypertension: Secondary | ICD-10-CM

## 2016-12-20 DIAGNOSIS — E782 Mixed hyperlipidemia: Secondary | ICD-10-CM | POA: Diagnosis not present

## 2016-12-20 NOTE — Patient Instructions (Signed)
Your physician recommends that you continue on your current medications as directed. Please refer to the Current Medication list given to you today.  Your physician recommends that you return for lab work TODAY (bmet, cbc)  Your physician has requested that you have an echocardiogram. Echocardiography is a painless test that uses sound waves to create images of your heart. It provides your doctor with information about the size and shape of your heart and how well your heart's chambers and valves are working. This procedure takes approximately one hour. There are no restrictions for this procedure.

## 2016-12-20 NOTE — Progress Notes (Signed)
Cardiology Office Note   Date:  12/20/2016   ID:  Diana Walters, DOB [birth year], MRN [id number]  PCP:  Renato Shin, MD  Cardiologist:   Dorris Carnes, MD    F/U of HTN      History of Present Illness: Diana Walters is a 61 y.o. female with a history of CVA  TEE showed small PFO  Alos history of thoracic aneurysm, s/p stent repair  Pt has hasitory of HTN I saw her in March 2018   She was at [facility] for renal bx in July  BP high and HR low    Seen by Liane Comber  Recomm decrease mtoprolol to 25 qe and start hydralazine 25 tid   She was seen by B Bhagat in July  BP up  Hydralazine increasd to 75 tid    Not taking BP at home  Breathing is OK  NO CP   Pt notes increased stress with ride here     Current Meds  Medication Sig  . amLODipine (NORVASC) 10 MG tablet Take 1 tablet (10 mg total) by mouth daily.  Marland Kitchen aspirin EC 81 MG tablet Take 81 mg by mouth daily.  Marland Kitchen atorvastatin (LIPITOR) 40 MG tablet TAKE 1 TABLET BY MOUTH EVERY DAY  . clopidogrel (PLAVIX) 75 MG tablet TAKE 1 TABLET BY MOUTH EVERY DAY WITH BREAKFAST  . diphenhydrAMINE (BENADRYL) 25 mg capsule Take 25 mg by mouth daily as needed for allergies (TAKES THE DAY OF INFUSION).  . hydrALAZINE (APRESOLINE) 50 MG tablet Take 1.5 tablets (75 mg total) by mouth 3 (three) times daily.  . InFLIXimab (REMICADE IV) Inject into the vein. At rheumatology  . levETIRAcetam (KEPPRA) 1000 MG tablet Take 1 tablet (1,000 mg total) by mouth 2 (two) times daily.  . methotrexate 2.5 MG tablet Take 7.5 mg by mouth once a week. Friday  . metoprolol succinate (TOPROL-XL) 25 MG 24 hr tablet Take 1 tablet (25 mg total) by mouth daily. Take with or immediately following a meal.  . Multiple Vitamin (MULTIVITAMIN) tablet Take 1 tablet by mouth daily.    Marland Kitchen triamterene-hydrochlorothiazide (MAXZIDE-25) 37.5-25 MG tablet Take 0.5 tablets by mouth daily.     Allergies:   Patient has no known allergies.   Past Medical History:  Diagnosis Date  . ANEMIA  10/17/2009  . ANXIETY 10/28/2006  . Arthritis   . ASTHMA 10/28/2006   no inhalers per pt  . Blood transfusion without reported diagnosis   . Depression   . Duodenitis without mention of hemorrhage 2005  . Heart murmur   . HYPERCHOLESTEROLEMIA 04/01/2008  . HYPERTENSION 10/28/2006  . Menopause    age 19  . Osteoporosis    bil knees  . SEIZURE DISORDER 04/01/2008  . Stroke ([place]) 11/2012  . 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 ESOPHAGOGASTRODUODENOSCOPY  01/29/2004  . LOOP RECORDER IMPLANT  12-01-2012   MDT LinQ implanted by Dr Rayann Heman for cyrptogenic stroke     Social History:  The patient  reports that she has been smoking cigarettes.  She has a 1.20 pack-year smoking history. she has never used smokeless tobacco. She reports that she drinks alcohol. She reports that she uses drugs. Drug: Marijuana.   Family History:  The patient's family history includes Breast cancer in her mother; Heart disease in her mother.    ROS:  Please see the history of present illness. All other systems are reviewed and  Negative to  the above problem except as noted.    PHYSICAL EXAM: VS:  BP (!) 174/82   Pulse 77   Ht 5\' 10"  (1.778 m)   Wt 158 lb 6.4 oz (71.8 kg)   SpO2 96%   BMI 22.73 kg/m   GEN: Well nourished, well developed, in no acute distress  HEENT: normal  Neck: JVP normal   Cardiac: RRR; Gr II-III/VI systolic murmur base of heart  NO, rubs, or gallops,no edema  Respiratory:  clear to auscultation bilaterally, normal work of breathing GI: soft, nontender, nondistended, + BS  No hepatomegaly  MS: no deformity Moving all extremities   Skin: warm and dry, no rash Neuro:  Strength and sensation are intact Psych: euthymic mood, full affect   EKG:  EKG is not  ordered today   Lipid Panel    Component Value Date/Time   CHOL 162 05/18/2016 0859   TRIG 97 05/18/2016 0859   HDL 48 05/18/2016 0859   CHOLHDL 3.4 05/18/2016 0859    CHOLHDL 3 12/20/2013 1016   VLDL 20.2 12/20/2013 1016   LDLCALC 95 05/18/2016 0859   LDLDIRECT 74.3 11/27/2010 1058      Wt Readings from Last 3 Encounters:  12/20/16 158 lb 6.4 oz (71.8 kg)  10/22/16 163 lb 6.4 oz (74.1 kg)  09/14/16 177 lb (80.3 kg)      ASSESSMENT AND PLAN:  1  HTN  BP is still high  I would set up for reanl USN  Cehck BMET and CBC  ? Compliance   She needs to keep log of BP at home    2  HL  Is supposed to be on lipitor  WIll check lpids    3  CVA  Has LINQ  On plavix    4  S/p  AAA repair  Followed by Pierre Bali.  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  The patient does not have concerns regarding medicines.  Signed, Dorris Carnes, MD  12/20/2016 11:13 AM    [address], [place], [place]  [address] Phone: [phone]; Fax: [phone]

## 2016-12-21 LAB — CBC
Hematocrit: 26 % — ABNORMAL LOW (ref 34.0–46.6)
Hemoglobin: 8 g/dL — ABNORMAL LOW (ref 11.1–15.9)
MCH: 22.9 pg — ABNORMAL LOW (ref 26.6–33.0)
MCHC: 30.8 g/dL — ABNORMAL LOW (ref 31.5–35.7)
MCV: 74 fL — AB (ref 79–97)
PLATELETS: 538 10*3/uL — AB (ref 150–379)
RBC: 3.5 x10E6/uL — ABNORMAL LOW (ref 3.77–5.28)
RDW: 21.1 % — AB (ref 12.3–15.4)
WBC: 8.1 10*3/uL (ref 3.4–10.8)

## 2016-12-21 LAB — BASIC METABOLIC PANEL
BUN/Creatinine Ratio: 15 (ref 12–28)
BUN: 26 mg/dL (ref 8–27)
CALCIUM: 9 mg/dL (ref 8.7–10.3)
CHLORIDE: 106 mmol/L (ref 96–106)
CO2: 19 mmol/L — ABNORMAL LOW (ref 20–29)
Creatinine, Ser: 1.78 mg/dL — ABNORMAL HIGH (ref 0.57–1.00)
GFR calc non Af Amer: 30 mL/min/{1.73_m2} — ABNORMAL LOW (ref >59)
GFR, EST AFRICAN AMERICAN: 35 mL/min/{1.73_m2} — AB (ref >59)
Glucose: 95 mg/dL (ref 65–99)
POTASSIUM: 3.6 mmol/L (ref 3.5–5.2)
Sodium: 140 mmol/L (ref 134–144)

## 2016-12-24 ENCOUNTER — Other Ambulatory Visit: Payer: Self-pay | Admitting: *Deleted

## 2016-12-24 DIAGNOSIS — D509 Iron deficiency anemia, unspecified: Secondary | ICD-10-CM

## 2016-12-24 DIAGNOSIS — Z79899 Other long term (current) drug therapy: Secondary | ICD-10-CM

## 2016-12-24 DIAGNOSIS — R0602 Shortness of breath: Secondary | ICD-10-CM

## 2016-12-24 DIAGNOSIS — D649 Anemia, unspecified: Secondary | ICD-10-CM

## 2016-12-27 ENCOUNTER — Ambulatory Visit (HOSPITAL_COMMUNITY): Payer: Medicare Other

## 2016-12-27 ENCOUNTER — Other Ambulatory Visit: Payer: Medicare Other | Admitting: *Deleted

## 2016-12-27 DIAGNOSIS — Z79899 Other long term (current) drug therapy: Secondary | ICD-10-CM | POA: Diagnosis not present

## 2016-12-27 DIAGNOSIS — D649 Anemia, unspecified: Secondary | ICD-10-CM

## 2016-12-27 DIAGNOSIS — D509 Iron deficiency anemia, unspecified: Secondary | ICD-10-CM | POA: Diagnosis not present

## 2016-12-28 LAB — CBC
HEMATOCRIT: 25.5 % — AB (ref 34.0–46.6)
HEMOGLOBIN: 7.9 g/dL — AB (ref 11.1–15.9)
MCH: 22.8 pg — ABNORMAL LOW (ref 26.6–33.0)
MCHC: 31 g/dL — AB (ref 31.5–35.7)
MCV: 74 fL — ABNORMAL LOW (ref 79–97)
Platelets: 480 10*3/uL — ABNORMAL HIGH (ref 150–379)
RBC: 3.47 x10E6/uL — AB (ref 3.77–5.28)
RDW: 21.4 % — ABNORMAL HIGH (ref 12.3–15.4)
WBC: 7.7 10*3/uL (ref 3.4–10.8)

## 2016-12-28 LAB — FERRITIN: Ferritin: 290 ng/mL — ABNORMAL HIGH (ref 15–150)

## 2016-12-28 LAB — IRON AND TIBC
IRON SATURATION: 12 % — AB (ref 15–55)
IRON: 22 ug/dL — AB (ref 27–139)
Total Iron Binding Capacity: 179 ug/dL — ABNORMAL LOW (ref 250–450)
UIBC: 157 ug/dL (ref 118–369)

## 2016-12-28 LAB — VITAMIN B12: Vitamin B-12: 605 pg/mL (ref 232–1245)

## 2016-12-28 LAB — FOLATE: FOLATE: 7.2 ng/mL (ref >3.0)

## 2016-12-29 ENCOUNTER — Other Ambulatory Visit: Payer: Self-pay | Admitting: *Deleted

## 2016-12-29 DIAGNOSIS — D509 Iron deficiency anemia, unspecified: Secondary | ICD-10-CM

## 2016-12-29 MED ORDER — FERROUS SULFATE 325 (65 FE) MG PO TABS: 325.0000 mg | ORAL_TABLET | Freq: Every day | ORAL | 3 refills | Status: DC

## 2017-01-14 ENCOUNTER — Ambulatory Visit (HOSPITAL_COMMUNITY): Payer: Medicare Other | Attending: Cardiology

## 2017-01-14 ENCOUNTER — Other Ambulatory Visit: Payer: Self-pay

## 2017-01-14 DIAGNOSIS — I5030 Unspecified diastolic (congestive) heart failure: Secondary | ICD-10-CM | POA: Insufficient documentation

## 2017-01-14 DIAGNOSIS — R011 Cardiac murmur, unspecified: Secondary | ICD-10-CM

## 2017-01-14 DIAGNOSIS — I420 Dilated cardiomyopathy: Secondary | ICD-10-CM | POA: Insufficient documentation

## 2017-01-19 ENCOUNTER — Other Ambulatory Visit: Payer: Medicare Other | Admitting: *Deleted

## 2017-01-19 DIAGNOSIS — D509 Iron deficiency anemia, unspecified: Secondary | ICD-10-CM

## 2017-01-20 LAB — CBC WITH DIFFERENTIAL/PLATELET
BASOS: 0 %
Basophils Absolute: 0 10*3/uL (ref 0.0–0.2)
EOS (ABSOLUTE): 0.3 10*3/uL (ref 0.0–0.4)
EOS: 4 %
HEMATOCRIT: 25.9 % — AB (ref 34.0–46.6)
HEMOGLOBIN: 8.4 g/dL — AB (ref 11.1–15.9)
Immature Grans (Abs): 0 10*3/uL (ref 0.0–0.1)
Immature Granulocytes: 0 %
LYMPHS ABS: 1.9 10*3/uL (ref 0.7–3.1)
Lymphs: 23 %
MCH: 23.7 pg — AB (ref 26.6–33.0)
MCHC: 32.4 g/dL (ref 31.5–35.7)
MCV: 73 fL — ABNORMAL LOW (ref 79–97)
MONOS ABS: 0.6 10*3/uL (ref 0.1–0.9)
Monocytes: 7 %
NEUTROS PCT: 66 %
Neutrophils Absolute: 5.5 10*3/uL (ref 1.4–7.0)
Platelets: 472 10*3/uL — ABNORMAL HIGH (ref 150–379)
RBC: 3.55 x10E6/uL — ABNORMAL LOW (ref 3.77–5.28)
RDW: 20.9 % — AB (ref 12.3–15.4)
WBC: 8.2 10*3/uL (ref 3.4–10.8)

## 2017-03-01 ENCOUNTER — Encounter: Payer: Self-pay | Admitting: Internal Medicine

## 2017-06-05 ENCOUNTER — Inpatient Hospital Stay (HOSPITAL_COMMUNITY)
Admission: EM | Admit: 2017-06-05 | Discharge: 2017-06-13 | DRG: 291 | Disposition: A | Discharge status: Home / Self Care | Payer: Medicare Other | Attending: Internal Medicine | Admitting: Internal Medicine

## 2017-06-05 ENCOUNTER — Other Ambulatory Visit: Payer: Self-pay

## 2017-06-05 ENCOUNTER — Emergency Department (HOSPITAL_BASED_OUTPATIENT_CLINIC_OR_DEPARTMENT_OTHER)
Admission: EM | Admit: 2017-06-05 | Discharge: 2017-06-05 | Disposition: A | Discharge status: Home / Self Care | Payer: Medicare Other | Source: Home / Self Care

## 2017-06-05 ENCOUNTER — Emergency Department (HOSPITAL_COMMUNITY): Payer: Medicare Other

## 2017-06-05 ENCOUNTER — Inpatient Hospital Stay (HOSPITAL_COMMUNITY): Payer: Medicare Other

## 2017-06-05 ENCOUNTER — Encounter (HOSPITAL_COMMUNITY): Payer: Self-pay | Admitting: Emergency Medicine

## 2017-06-05 DIAGNOSIS — Z886 Allergy status to analgesic agent status: Secondary | ICD-10-CM

## 2017-06-05 DIAGNOSIS — J181 Lobar pneumonia, unspecified organism: Secondary | ICD-10-CM | POA: Diagnosis not present

## 2017-06-05 DIAGNOSIS — R0602 Shortness of breath: Secondary | ICD-10-CM | POA: Diagnosis not present

## 2017-06-05 DIAGNOSIS — M069 Rheumatoid arthritis, unspecified: Secondary | ICD-10-CM | POA: Diagnosis present

## 2017-06-05 DIAGNOSIS — R7989 Other specified abnormal findings of blood chemistry: Secondary | ICD-10-CM | POA: Diagnosis present

## 2017-06-05 DIAGNOSIS — R140 Abdominal distension (gaseous): Secondary | ICD-10-CM

## 2017-06-05 DIAGNOSIS — Z8679 Personal history of other diseases of the circulatory system: Secondary | ICD-10-CM

## 2017-06-05 DIAGNOSIS — J189 Pneumonia, unspecified organism: Secondary | ICD-10-CM | POA: Diagnosis present

## 2017-06-05 DIAGNOSIS — Z7982 Long term (current) use of aspirin: Secondary | ICD-10-CM | POA: Diagnosis not present

## 2017-06-05 DIAGNOSIS — R001 Bradycardia, unspecified: Secondary | ICD-10-CM | POA: Diagnosis not present

## 2017-06-05 DIAGNOSIS — I110 Hypertensive heart disease with heart failure: Secondary | ICD-10-CM | POA: Diagnosis not present

## 2017-06-05 DIAGNOSIS — Z79899 Other long term (current) drug therapy: Secondary | ICD-10-CM | POA: Diagnosis not present

## 2017-06-05 DIAGNOSIS — I071 Rheumatic tricuspid insufficiency: Secondary | ICD-10-CM | POA: Diagnosis present

## 2017-06-05 DIAGNOSIS — I509 Heart failure, unspecified: Secondary | ICD-10-CM | POA: Diagnosis not present

## 2017-06-05 DIAGNOSIS — R609 Edema, unspecified: Secondary | ICD-10-CM

## 2017-06-05 DIAGNOSIS — I10 Essential (primary) hypertension: Secondary | ICD-10-CM | POA: Diagnosis not present

## 2017-06-05 DIAGNOSIS — J9601 Acute respiratory failure with hypoxia: Secondary | ICD-10-CM

## 2017-06-05 DIAGNOSIS — R710 Precipitous drop in hematocrit: Secondary | ICD-10-CM | POA: Diagnosis not present

## 2017-06-05 DIAGNOSIS — N183 Chronic kidney disease, stage 3 (moderate): Secondary | ICD-10-CM | POA: Diagnosis not present

## 2017-06-05 DIAGNOSIS — R748 Abnormal levels of other serum enzymes: Secondary | ICD-10-CM | POA: Diagnosis not present

## 2017-06-05 DIAGNOSIS — J9600 Acute respiratory failure, unspecified whether with hypoxia or hypercapnia: Secondary | ICD-10-CM | POA: Diagnosis present

## 2017-06-05 DIAGNOSIS — I119 Hypertensive heart disease without heart failure: Secondary | ICD-10-CM | POA: Diagnosis present

## 2017-06-05 DIAGNOSIS — N179 Acute kidney failure, unspecified: Secondary | ICD-10-CM | POA: Diagnosis not present

## 2017-06-05 DIAGNOSIS — R011 Cardiac murmur, unspecified: Secondary | ICD-10-CM | POA: Diagnosis not present

## 2017-06-05 DIAGNOSIS — D649 Anemia, unspecified: Secondary | ICD-10-CM | POA: Diagnosis not present

## 2017-06-05 DIAGNOSIS — J9691 Respiratory failure, unspecified with hypoxia: Secondary | ICD-10-CM

## 2017-06-05 DIAGNOSIS — F17210 Nicotine dependence, cigarettes, uncomplicated: Secondary | ICD-10-CM | POA: Diagnosis present

## 2017-06-05 DIAGNOSIS — D631 Anemia in chronic kidney disease: Secondary | ICD-10-CM | POA: Diagnosis present

## 2017-06-05 DIAGNOSIS — E875 Hyperkalemia: Secondary | ICD-10-CM | POA: Diagnosis present

## 2017-06-05 DIAGNOSIS — I130 Hypertensive heart and chronic kidney disease with heart failure and stage 1 through stage 4 chronic kidney disease, or unspecified chronic kidney disease: Principal | ICD-10-CM | POA: Diagnosis present

## 2017-06-05 DIAGNOSIS — Z8673 Personal history of transient ischemic attack (TIA), and cerebral infarction without residual deficits: Secondary | ICD-10-CM

## 2017-06-05 DIAGNOSIS — Z885 Allergy status to narcotic agent status: Secondary | ICD-10-CM

## 2017-06-05 DIAGNOSIS — I429 Cardiomyopathy, unspecified: Secondary | ICD-10-CM

## 2017-06-05 DIAGNOSIS — I5021 Acute systolic (congestive) heart failure: Secondary | ICD-10-CM

## 2017-06-05 DIAGNOSIS — J440 Chronic obstructive pulmonary disease with acute lower respiratory infection: Secondary | ICD-10-CM | POA: Diagnosis present

## 2017-06-05 DIAGNOSIS — E7800 Pure hypercholesterolemia, unspecified: Secondary | ICD-10-CM | POA: Diagnosis present

## 2017-06-05 DIAGNOSIS — N189 Chronic kidney disease, unspecified: Secondary | ICD-10-CM | POA: Diagnosis not present

## 2017-06-05 DIAGNOSIS — Z95828 Presence of other vascular implants and grafts: Secondary | ICD-10-CM

## 2017-06-05 DIAGNOSIS — I499 Cardiac arrhythmia, unspecified: Secondary | ICD-10-CM

## 2017-06-05 DIAGNOSIS — D638 Anemia in other chronic diseases classified elsewhere: Secondary | ICD-10-CM | POA: Diagnosis not present

## 2017-06-05 DIAGNOSIS — J9690 Respiratory failure, unspecified, unspecified whether with hypoxia or hypercapnia: Secondary | ICD-10-CM | POA: Diagnosis not present

## 2017-06-05 DIAGNOSIS — G40909 Epilepsy, unspecified, not intractable, without status epilepticus: Secondary | ICD-10-CM | POA: Diagnosis present

## 2017-06-05 DIAGNOSIS — Z8249 Family history of ischemic heart disease and other diseases of the circulatory system: Secondary | ICD-10-CM | POA: Diagnosis not present

## 2017-06-05 DIAGNOSIS — Z95818 Presence of other cardiac implants and grafts: Secondary | ICD-10-CM | POA: Diagnosis not present

## 2017-06-05 DIAGNOSIS — E785 Hyperlipidemia, unspecified: Secondary | ICD-10-CM | POA: Diagnosis present

## 2017-06-05 DIAGNOSIS — N184 Chronic kidney disease, stage 4 (severe): Secondary | ICD-10-CM | POA: Diagnosis present

## 2017-06-05 DIAGNOSIS — I5031 Acute diastolic (congestive) heart failure: Secondary | ICD-10-CM | POA: Diagnosis not present

## 2017-06-05 DIAGNOSIS — R778 Other specified abnormalities of plasma proteins: Secondary | ICD-10-CM | POA: Diagnosis present

## 2017-06-05 LAB — HEPATIC FUNCTION PANEL
ALBUMIN: 2.7 g/dL — AB (ref 3.5–5.0)
ALT: 14 U/L (ref 14–54)
AST: 43 U/L — AB (ref 15–41)
Alkaline Phosphatase: 76 U/L (ref 38–126)
Bilirubin, Direct: 0.5 mg/dL (ref 0.1–0.5)
Indirect Bilirubin: 1.1 mg/dL — ABNORMAL HIGH (ref 0.3–0.9)
TOTAL PROTEIN: 7.6 g/dL (ref 6.5–8.1)
Total Bilirubin: 1.6 mg/dL — ABNORMAL HIGH (ref 0.3–1.2)

## 2017-06-05 LAB — I-STAT CHEM 8, ED
BUN: 52 mg/dL — AB (ref 6–20)
Calcium, Ion: 1.02 mmol/L — ABNORMAL LOW (ref 1.15–1.40)
Chloride: 112 mmol/L — ABNORMAL HIGH (ref 101–111)
Creatinine, Ser: 3.6 mg/dL — ABNORMAL HIGH (ref 0.44–1.00)
GLUCOSE: 121 mg/dL — AB (ref 65–99)
HCT: 25 % — ABNORMAL LOW (ref 36.0–46.0)
Hemoglobin: 8.5 g/dL — ABNORMAL LOW (ref 12.0–15.0)
Potassium: 5.8 mmol/L — ABNORMAL HIGH (ref 3.5–5.1)
SODIUM: 140 mmol/L (ref 135–145)
TCO2: 22 mmol/L (ref 22–32)

## 2017-06-05 LAB — CBC WITH DIFFERENTIAL/PLATELET
BASOS PCT: 0 %
Basophils Absolute: 0 10*3/uL (ref 0.0–0.1)
EOS ABS: 0 10*3/uL (ref 0.0–0.7)
EOS PCT: 0 %
HCT: 24.1 % — ABNORMAL LOW (ref 36.0–46.0)
Hemoglobin: 7.7 g/dL — ABNORMAL LOW (ref 12.0–15.0)
Lymphocytes Relative: 8 %
Lymphs Abs: 1.3 10*3/uL (ref 0.7–4.0)
MCH: 22.6 pg — ABNORMAL LOW (ref 26.0–34.0)
MCHC: 32 g/dL (ref 30.0–36.0)
MCV: 70.7 fL — ABNORMAL LOW (ref 78.0–100.0)
MONO ABS: 0.5 10*3/uL (ref 0.1–1.0)
MONOS PCT: 3 %
Neutro Abs: 13.5 10*3/uL — ABNORMAL HIGH (ref 1.7–7.7)
Neutrophils Relative %: 89 %
PLATELETS: 411 10*3/uL — AB (ref 150–400)
RBC: 3.41 MIL/uL — ABNORMAL LOW (ref 3.87–5.11)
RDW: 20.9 % — ABNORMAL HIGH (ref 11.5–15.5)
WBC: 15.3 10*3/uL — ABNORMAL HIGH (ref 4.0–10.5)

## 2017-06-05 LAB — I-STAT CG4 LACTIC ACID, ED
LACTIC ACID, VENOUS: 2.28 mmol/L — AB (ref 0.5–1.9)
Lactic Acid, Venous: 1.92 mmol/L — ABNORMAL HIGH (ref 0.5–1.9)

## 2017-06-05 LAB — TSH: TSH: 1.092 u[IU]/mL (ref 0.350–4.500)

## 2017-06-05 LAB — MAGNESIUM: Magnesium: 2.2 mg/dL (ref 1.7–2.4)

## 2017-06-05 LAB — BASIC METABOLIC PANEL
Anion gap: 15 (ref 5–15)
BUN: 40 mg/dL — AB (ref 6–20)
CHLORIDE: 109 mmol/L (ref 101–111)
CO2: 18 mmol/L — AB (ref 22–32)
Calcium: 8.4 mg/dL — ABNORMAL LOW (ref 8.9–10.3)
Creatinine, Ser: 3.66 mg/dL — ABNORMAL HIGH (ref 0.44–1.00)
GFR calc non Af Amer: 12 mL/min — ABNORMAL LOW (ref >60)
GFR, EST AFRICAN AMERICAN: 14 mL/min — AB (ref >60)
Glucose, Bld: 132 mg/dL — ABNORMAL HIGH (ref 65–99)
POTASSIUM: 5.2 mmol/L — AB (ref 3.5–5.1)
SODIUM: 142 mmol/L (ref 135–145)

## 2017-06-05 LAB — I-STAT TROPONIN, ED: TROPONIN I, POC: 0.19 ng/mL — AB (ref 0.00–0.08)

## 2017-06-05 LAB — BRAIN NATRIURETIC PEPTIDE

## 2017-06-05 LAB — TROPONIN I: Troponin I: 0.21 ng/mL (ref <0.03)

## 2017-06-05 LAB — MRSA PCR SCREENING: MRSA BY PCR: NEGATIVE

## 2017-06-05 LAB — PROTIME-INR
INR: 1.26
PROTHROMBIN TIME: 15.7 s — AB (ref 11.4–15.2)

## 2017-06-05 LAB — PHOSPHORUS: PHOSPHORUS: 5.4 mg/dL — AB (ref 2.5–4.6)

## 2017-06-05 MED ORDER — FUROSEMIDE 10 MG/ML IJ SOLN
40.0000 mg | Freq: Once | INTRAMUSCULAR | Status: AC
  Administered 2017-06-05: 40 mg via INTRAVENOUS
  Filled 2017-06-05: qty 4

## 2017-06-05 MED ORDER — ALBUTEROL SULFATE (2.5 MG/3ML) 0.083% IN NEBU: 5.0000 mg | INHALATION_SOLUTION | Freq: Once | RESPIRATORY_TRACT | Status: DC

## 2017-06-05 MED ORDER — HEPARIN SODIUM (PORCINE) 5000 UNIT/ML IJ SOLN
5000.0000 [IU] | Freq: Three times a day (TID) | INTRAMUSCULAR | Status: DC
  Administered 2017-06-06 – 2017-06-08 (×8): 5000 [IU] via SUBCUTANEOUS
  Filled 2017-06-05 (×8): qty 1

## 2017-06-05 MED ORDER — HYDRALAZINE HCL 20 MG/ML IJ SOLN
10.0000 mg | INTRAMUSCULAR | Status: DC | PRN
  Administered 2017-06-05: 20 mg via INTRAVENOUS
  Administered 2017-06-06 (×3): 40 mg via INTRAVENOUS
  Filled 2017-06-05 (×2): qty 2
  Filled 2017-06-05: qty 1
  Filled 2017-06-05: qty 2

## 2017-06-05 MED ORDER — ONDANSETRON HCL 4 MG/2ML IJ SOLN: 4.0000 mg | Freq: Four times a day (QID) | INTRAMUSCULAR | Status: DC | PRN

## 2017-06-05 MED ORDER — ASPIRIN 81 MG PO CHEW
324.0000 mg | CHEWABLE_TABLET | Freq: Once | ORAL | Status: AC
  Administered 2017-06-05: 324 mg via ORAL
  Filled 2017-06-05: qty 4

## 2017-06-05 MED ORDER — HYDRALAZINE HCL 20 MG/ML IJ SOLN
20.0000 mg | Freq: Once | INTRAMUSCULAR | Status: AC
  Administered 2017-06-05: 20 mg via INTRAVENOUS
  Filled 2017-06-05: qty 1

## 2017-06-05 MED ORDER — SODIUM CHLORIDE 0.9 % IV SOLN: 250.0000 mL | INTRAVENOUS | Status: DC | PRN

## 2017-06-05 MED ORDER — ACETAMINOPHEN 325 MG PO TABS: 650.0000 mg | ORAL_TABLET | ORAL | Status: DC | PRN

## 2017-06-05 MED ORDER — LORAZEPAM 2 MG/ML IJ SOLN
0.5000 mg | Freq: Once | INTRAMUSCULAR | Status: AC
  Administered 2017-06-05: 0.5 mg via INTRAVENOUS
  Filled 2017-06-05: qty 1

## 2017-06-05 NOTE — ED Notes (Signed)
ECHO procedure in room still being conducted.

## 2017-06-05 NOTE — ED Notes (Signed)
Requesting IV Ultrasound.

## 2017-06-05 NOTE — ED Notes (Signed)
Delay in ekg due to echo at bedside

## 2017-06-05 NOTE — ED Notes (Signed)
RT has been called to assist CT with patient being on Bi-Pap.

## 2017-06-05 NOTE — ED Notes (Signed)
MD AND RN NOTIFIED OF PATIENT'S TROPONIN LEVEL OF 0.19

## 2017-06-05 NOTE — H&P (Signed)
Name: Diana Walters MRN: [id number] DOB: Jun 26, 1955    ADMISSION DATE:  06/05/2017 CONSULTATION DATE:  06/05/2017  REFERRING MD :  EDP , Pfeiffer  CHIEF COMPLAINT: Respiratory distress  HISTORY OF PRESENT ILLNESS: 62 year old woman with hypertension, thoracic aortic aneurysm status post stent repair at [facility] in 2015 with small contained leak and a history of CVA, brought in by EMS 4/21 for acute respiratory distress that seems to have developed over the last 2 days.  She has a dry cough, no fevers or sputum production denies chest pain.  Chest x-ray showed bilateral infiltrates and she was placed on BiPAP in the emergency room.  Troponin was 0.19 and labs showed new acute renal failure with creatinine increased from 1.8-3.6.  Cardiology was consulted as well as vascular and P CCM asked to admit.     SIGNIFICANT EVENTS    STUDIES:  CT chest without contrast -bilateral infiltrates and small right effusion, no worsening of endoleak. Echo-EF 35%, severe TR Duplex both lower extremities-negative     PAST MEDICAL HISTORY :   has a past medical history of ANEMIA (10/17/2009), ANXIETY (10/28/2006), Arthritis, ASTHMA (10/28/2006), Blood transfusion without reported diagnosis, Depression, Duodenitis without mention of hemorrhage (2005), Heart murmur, HYPERCHOLESTEROLEMIA (04/01/2008), HYPERTENSION (10/28/2006), Menopause, Osteoporosis, SEIZURE DISORDER (04/01/2008), Stroke ([place]) (11/2012), and THORACIC AORTIC ANEURYSM (04/01/2008).  has a past surgical history that includes Esophagogastroduodenoscopy (01/29/2004); TEE without cardioversion (N/A, 12/01/2012); Loop recorder implant (12-01-2012); and loop recorder implant (N/A, 12/01/2012). Prior to Admission medications   Medication Sig Start Date End Date Taking? Authorizing Provider  amLODipine (NORVASC) 10 MG tablet Take 1 tablet (10 mg total) by mouth daily. 03/30/16   Fay Records, MD  aspirin EC 81 MG tablet Take 81 mg by mouth daily.    [provider]  atorvastatin (LIPITOR) 40 MG tablet TAKE 1 TABLET BY MOUTH EVERY DAY 07/19/16   Fay Records, MD  diphenhydrAMINE (BENADRYL) 25 mg capsule Take 25 mg by mouth daily as needed for allergies (TAKES THE DAY OF INFUSION).    [provider]  ferrous sulfate 325 (65 FE) MG tablet Take 1 tablet (325 mg total) daily with breakfast by mouth. 12/29/16   Fay Records, MD  hydrALAZINE (APRESOLINE) 50 MG tablet Take 1.5 tablets (75 mg total) by mouth 3 (three) times daily. 10/22/16   Bhagat, Crista Luria, PA  InFLIXimab (REMICADE IV) Inject into the vein. At rheumatology    [provider]  levETIRAcetam (KEPPRA) 1000 MG tablet Take 1 tablet (1,000 mg total) by mouth 2 (two) times daily. 06/07/16   Dennie Bible, NP  methotrexate 2.5 MG tablet Take 7.5 mg by mouth once a week. Friday    [provider]  metoprolol succinate (TOPROL-XL) 25 MG 24 hr tablet Take 1 tablet (25 mg total) by mouth daily. Take with or immediately following a meal. 09/14/16   Carlisle Cater, PA-C  Multiple Vitamin (MULTIVITAMIN) tablet Take 1 tablet by mouth daily.      [provider]  triamterene-hydrochlorothiazide (MAXZIDE-25) 37.5-25 MG tablet Take 0.5 tablets by mouth daily.    [provider]   Allergies  Allergen Reactions  . Codeine Other (See Comments)    hallucinations  . Ibuprofen     Can't take due to medications    FAMILY HISTORY:  family history includes Breast cancer in her mother; Heart disease in her mother. SOCIAL HISTORY:  reports that she has been smoking cigarettes.  She has a 6.00 pack-year smoking history.  She has never used smokeless tobacco. She reports that she drinks alcohol. She reports that she has current or past drug history. Drug: Marijuana.  REVIEW OF SYSTEMS:   Unable to obtain in detail since she is on BiPAP. Reports dry cough for 3 days, respiratory distress for 1 day, pedal edema for 1 week  No chest pain, orthopnea  paroxysmal nocturnal dyspnea, no back pain No nausea vomiting diarrhea  SUBJECTIVE:   VITAL SIGNS: Temp:  [98.2 F (36.8 C)] 98.2 F (36.8 C) (04/21 0639) Pulse Rate:  [40-95] 40 (04/21 1215) Resp:  [18-43] 22 (04/21 1215) BP: (111-203)/(64-106) 135/71 (04/21 1215) SpO2:  [89 %-100 %] 100 % (04/21 1215) Weight:  [160 lb (72.6 kg)] 160 lb (72.6 kg) (04/21 0641)  PHYSICAL EXAMINATION: Gen. Pleasant, well-nourished, in no distress, on bipap ENT - , no post nasal drip Neck:  JVD ++, no thyromegaly, no carotid bruits Lungs: no use of accessory muscles, no dullness to percussion, clear without rales or rhonchi  Cardiovascular: Rhythm regular, heart sounds  normal, PSM 3/6 at rt sternal border,diastolic murmur at apex 2+ peripheral edema Abdomen: soft and non-tender, no hepatosplenomegaly, BS normal. Musculoskeletal: No deformities, no cyanosis or clubbing Neuro:  alert, non focal   Recent Labs  Lab 06/05/17 0811 06/05/17 0852  NA 142 140  K 5.2* 5.8*  CL 109 112*  CO2 18*  --   BUN 40* 52*  CREATININE 3.66* 3.60*  GLUCOSE 132* 121*   Recent Labs  Lab 06/05/17 0846 06/05/17 0852  HGB 7.7* 8.5*  HCT 24.1* 25.0*  WBC 15.3*  --   PLT 411*  --    Ct Chest Wo Contrast  Result Date: 06/05/2017 CLINICAL DATA:  Worsening shortness of breath for several days. History of hypertension, thoracic aortic aneurysm. EXAM: CT CHEST WITHOUT CONTRAST TECHNIQUE: Multidetector CT imaging of the chest was performed following the standard protocol without IV contrast. COMPARISON:  Chest radiograph June 05, 2017 FINDINGS: CARDIOVASCULAR: Heart size is moderately enlarged, increased from prior CT. No pericardial effusion. Status post thoracic aorta endograft, similar noncontrast appearance. Calcific atherosclerosis aortic arch. Mild coronary artery calcifications. MEDIASTINUM/NODES: Worsening mediastinal lymphadenopathy measuring to 17 mm short axis, previously 13 mm. Assessment of lymphadenopathy  limited by noncontrast CT. LUNGS/PLEURA: Small RIGHT pleural effusion. Multifocal consolidation bilateral lungs with perihilar predominance. Faint alveolar airspace opacities. Bibasilar atelectasis. Tracheobronchial tree is patent. UPPER ABDOMEN: Nonacute. 2.6 cm cyst LEFT interpolar kidney, incompletely imaged. Minimal cholelithiasis, incompletely assessed. MUSCULOSKELETAL: Nonacute. Status post median sternotomy. Mild degenerative change of lumbar spine, moderate T11-12 degenerative disc. IMPRESSION: 1. Multifocal consolidation seen with pulmonary edema and/or pneumonia. Small RIGHT pleural effusion. 2. Worsening moderate cardiomegaly. 3. Status post endograft repair of thoracic aortic aneurysm. Aortic Atherosclerosis (ICD10-I70.0). Electronically Signed   By: Elon Alas M.D.   On: 06/05/2017 13:19   Dg Chest Port 1 View  Result Date: 06/05/2017 CLINICAL DATA:  Shortness of breath today. EXAM: PORTABLE CHEST 1 VIEW COMPARISON:  CT chest 01/26/2016. FINDINGS: Extensive bilateral airspace disease is present with the most confluent opacities in the right mid and upper lung zones. Small to moderate bilateral pleural effusions are seen. No pneumothorax. Stent graft for aortic aneurysm repair is identified. No pneumothorax. IMPRESSION: Multifocal airspace disease most confluent in the right upper and mid lung zones worrisome for pneumonia. Coexistent pulmonary edema is also possible. Electronically Signed   By: Inge Rise M.D.   On: 06/05/2017 09:13    ASSESSMENT / PLAN:  She does seem to have  new systolic heart failure with high BNP and worsening renal failure.  There does not seem to be in any worsening of her aneurysmal leak.  This does not seem to be infectious and clinically this he does not seem to have a pneumonia.  Blood pressure seems to have improved with hydralazine and Lasix.  Acute respiratory failure with hypoxia-continue BiPAP at current settings, drop FiO2 she seems to be  comfortable on this -Diuresis as able  New Acute systolic heart failure Elevated troponin  -Need cardiology input, not sure if this is related to aneurysm in some way She is on aspirin and Plavix, hesitant to add heparin unless cardiology feels this is necessary. We will defer treatment to them. Will admit to cardiac unit at [facility] since this appears to be the main diagnosis and ask cardiology to assume care when she gets there  AK I with hyperkalemia-baseline creatinine 1.8 12/2016 Obtain urine FENa, appears to be clinically fluid overloaded. Agree with Lasix, repeat bmet and if remains high then may need Kayexalate -no EKG changes of hyperkalemia present  Hypertension-use  Hydralazine and Lopressor as needed    The patient is critically ill with multiple organ systems failure and requires high complexity decision making for assessment and support, frequent evaluation and titration of therapies, application of advanced monitoring technologies and extensive interpretation of multiple databases. Critical Care Time devoted to patient care services described in this note independent of APP/resident  time is 60 minutes.   Kara Mead MD. Shade Flood. [facility] Pager [phone] If no response call [phone]     06/05/2017, 2:08 PM

## 2017-06-05 NOTE — ED Notes (Signed)
Pt had small bm in bedpan but no urine noted at this time.

## 2017-06-05 NOTE — ED Notes (Signed)
Patient transported to CT 

## 2017-06-05 NOTE — ED Notes (Signed)
MD AND RN NOTIFIED OF PATIENT'S LACTIC ACID LEVEL OF  2.28

## 2017-06-05 NOTE — ED Notes (Signed)
Pt has been placed on BI-PAP

## 2017-06-05 NOTE — ED Provider Notes (Signed)
[facility] DEPT Provider Note   CSN: [id number] Arrival date & time: 06/05/17  0867   History   Chief Complaint Chief Complaint  Patient presents with  . Shortness of Breath    HPI Diana Walters is a 62 y.o. female who presents with SOB. 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 She states that she has been SOB with activity and even talking over the past couple of days. It has progressively worsened so that now she's SOB at rest. She has also had worsening extremity swelling. She typically has swelling in her knees because of her RA but does not have swelling of her feet and legs typically. She has not had a fever. She has had mild wheezing and a dry cough. No lightheadedness, syncope, chest pain. She has also had constipation with black stools recently. She is not on blood thinners. She is a smoker. She has been off of her blood pressure medicines for approximately 6 months. Her cardiologist, Dr. Ysidro Evert, is at [facility]. Echo in Nov 2018 showed EF was 55-60%, mild LVH, sclerotic aortic valve with trace AI.  HPI  Past Medical History:  Diagnosis Date  . ANEMIA 10/17/2009  . ANXIETY 10/28/2006  . Arthritis   . ASTHMA 10/28/2006   no inhalers per pt  . Blood transfusion without reported diagnosis   . Depression   . Duodenitis without mention of hemorrhage 2005  . Heart murmur   . HYPERCHOLESTEROLEMIA 04/01/2008  . HYPERTENSION 10/28/2006  . Menopause    age 18  . Osteoporosis    bil knees  . SEIZURE DISORDER 04/01/2008  . Stroke ([place]) 11/2012  . THORACIC AORTIC ANEURYSM 04/01/2008    Patient Active Problem List   Diagnosis Date Noted  . Seizure disorder ([place]) 06/07/2016  . Hypertensive cardiomegaly 11/03/2013  . History of stroke 11/03/2013  . Heart murmur, systolic 61/95/0932  . Encounter for screening colonoscopy 09/12/2013  . Goiter, simple 07/06/2013    . Routine general medical examination at a health care facility 07/06/2013  . Acute CVA (cerebrovascular accident) ([place]) 11/29/2012  . Iron deficiency anemia, unspecified 06/16/2012  . Encounter for long-term current use of medication 05/17/2012  . Arthralgia 05/17/2012  . Menopause 11/27/2010  . Hematuria 11/27/2010  . KNEE PAIN, LEFT 02/18/2010  . HYPERCHOLESTEROLEMIA 04/01/2008  . 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 Convulsions ([place]) 04/01/2008  . C O P D 05/29/2007  . PRURITUS 05/25/2007  . ANXIETY 10/28/2006  . Essential hypertension 10/28/2006  . ASTHMA 10/28/2006  . Rheumatoid arthritis(714.0) 10/28/2006    Past Surgical History:  Procedure Laterality Date  . ESOPHAGOGASTRODUODENOSCOPY  01/29/2004  . 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 LOOP RECORDER IMPLANT N/A 12/01/2012   Procedure: LOOP RECORDER IMPLANT;  Surgeon: Coralyn Mark, MD;  Location: [facility] CATH LAB;  Service: Cardiovascular;  Laterality: N/A;  . TEE WITHOUT CARDIOVERSION N/A 12/01/2012   Procedure: TRANSESOPHAGEAL ECHOCARDIOGRAM (TEE);  Surgeon: Fay Records, MD;  Location: [facility] ENDOSCOPY;  Service: Cardiovascular;  Laterality: N/A;     OB History   None      Home Medications    Prior to Admission medications   Medication Sig Start Date End Date Taking? Authorizing Provider  amLODipine (NORVASC) 10 MG tablet Take 1 tablet (10 mg total) by mouth daily. 03/30/16   Fay Records, MD  aspirin  EC 81 MG tablet Take 81 mg by mouth daily.    [provider]  atorvastatin (LIPITOR) 40 MG tablet TAKE 1 TABLET BY MOUTH EVERY DAY 07/19/16   Fay Records, MD  diphenhydrAMINE (BENADRYL) 25 mg capsule Take 25 mg by mouth daily as needed for allergies (TAKES THE DAY OF INFUSION).    [provider]  ferrous sulfate 325 (65 FE) MG tablet Take 1 tablet (325 mg total) daily with breakfast by mouth. 12/29/16   Fay Records, MD  hydrALAZINE  (APRESOLINE) 50 MG tablet Take 1.5 tablets (75 mg total) by mouth 3 (three) times daily. 10/22/16   Bhagat, Crista Luria, PA  InFLIXimab (REMICADE IV) Inject into the vein. At rheumatology    [provider]  levETIRAcetam (KEPPRA) 1000 MG tablet Take 1 tablet (1,000 mg total) by mouth 2 (two) times daily. 06/07/16   Dennie Bible, NP  methotrexate 2.5 MG tablet Take 7.5 mg by mouth once a week. Friday    [provider]  metoprolol succinate (TOPROL-XL) 25 MG 24 hr tablet Take 1 tablet (25 mg total) by mouth daily. Take with or immediately following a meal. 09/14/16   Carlisle Cater, PA-C  Multiple Vitamin (MULTIVITAMIN) tablet Take 1 tablet by mouth daily.      [provider]  triamterene-hydrochlorothiazide (MAXZIDE-25) 37.5-25 MG tablet Take 0.5 tablets by mouth daily.    [provider]    Family History Family History  Problem Relation Age of Onset  . Breast cancer Mother   . Heart disease Mother        before age 36  . Colon cancer Neg Hx   . Esophageal cancer Neg Hx   . Pancreatic cancer Neg Hx   . Rectal cancer Neg Hx   . Stomach cancer Neg Hx     Social History Social History   Tobacco Use  . Smoking status: Current Every Day Smoker    Packs/day: 0.50    Years: 12.00    Pack years: 6.00    Types: Cigarettes  . Smokeless tobacco: Never Used  Substance Use Topics  . Alcohol use: Yes    Alcohol/week: 0.0 oz  . Drug use: Yes    Types: Marijuana     Allergies   Codeine and Ibuprofen   Review of Systems Review of Systems  Constitutional: Negative for fever.  Respiratory: Positive for cough, shortness of breath and wheezing.   Cardiovascular: Positive for leg swelling. Negative for chest pain.  Gastrointestinal: Positive for constipation. Negative for abdominal pain, nausea and vomiting.       +black stools  Allergic/Immunologic: Positive for immunocompromised state.  Neurological: Negative for syncope and  light-headedness.  All other systems reviewed and are negative.    Physical Exam Updated Vital Signs BP (!) 151/98   Pulse 88   Temp 98.2 F (36.8 C) (Oral)   Resp (!) 33   Ht 5\' 7"  (1.702 m)   Wt 72.6 kg (160 lb)   SpO2 (!) 89%   BMI 25.06 kg/m   Physical Exam  Constitutional: She is oriented to person, place, and time. She appears well-developed and well-nourished. No distress.  HENT:  Head: Normocephalic and atraumatic.  Eyes: Pupils are equal, round, and reactive to light. Conjunctivae are normal. Right eye exhibits no discharge. Left eye exhibits no discharge. No scleral icterus.  Neck: Normal range of motion.  Cardiovascular: Normal rate and regular rhythm.  Murmur heard. Pulmonary/Chest: Tachypnea noted. No respiratory distress. She has rales.  Large surgical scar noted  Abdominal: Soft. Bowel sounds are normal. She exhibits no distension. There is no tenderness.  Musculoskeletal:  2+ bilateral peripheral edema  Neurological: She is alert and oriented to person, place, and time.  Skin: Skin is warm and dry.  Psychiatric: She has a normal mood and affect. Her behavior is normal.  Nursing note and vitals reviewed.    ED Treatments / Results  Labs (all labs ordered are listed, but only abnormal results are displayed) Labs Reviewed  BASIC METABOLIC PANEL - Abnormal; Notable for the following components:      Result Value   Potassium 5.2 (*)    CO2 18 (*)    Glucose, Bld 132 (*)    BUN 40 (*)    Creatinine, Ser 3.66 (*)    Calcium 8.4 (*)    GFR calc non Af Amer 12 (*)    GFR calc Af Amer 14 (*)    All other components within normal limits  HEPATIC FUNCTION PANEL - Abnormal; Notable for the following components:   Albumin 2.7 (*)    AST 43 (*)    Total Bilirubin 1.6 (*)    Indirect Bilirubin 1.1 (*)    All other components within normal limits  PHOSPHORUS - Abnormal; Notable for the following components:   Phosphorus 5.4 (*)    All other components  within normal limits  I-STAT TROPONIN, ED - Abnormal; Notable for the following components:   Troponin i, poc 0.19 (*)    All other components within normal limits  I-STAT CG4 LACTIC ACID, ED - Abnormal; Notable for the following components:   Lactic Acid, Venous 2.28 (*)    All other components within normal limits  I-STAT CG4 LACTIC ACID, ED - Abnormal; Notable for the following components:   Lactic Acid, Venous 1.92 (*)    All other components within normal limits  I-STAT CHEM 8, ED - Abnormal; Notable for the following components:   Potassium 5.8 (*)    Chloride 112 (*)    BUN 52 (*)    Creatinine, Ser 3.60 (*)    Glucose, Bld 121 (*)    Calcium, Ion 1.02 (*)    Hemoglobin 8.5 (*)    HCT 25.0 (*)    All other components within normal limits  MAGNESIUM  TSH  CBC WITH DIFFERENTIAL/PLATELET  URINALYSIS, ROUTINE W REFLEX MICROSCOPIC  TROPONIN I  TROPONIN I  TROPONIN I  TROPONIN I  BASIC METABOLIC PANEL  I-STAT CHEM 8, ED  I-STAT CHEM 8, ED  I-STAT CHEM 8, ED    EKG EKG Interpretation  Date/Time:  Sunday June 05 2017 06:42:21 EDT Ventricular Rate:  89 PR Interval:    QRS Duration: 84 QT Interval:  397 QTC Calculation: 484 R Axis:   56 Text Interpretation:  Sinus rhythm Probable left atrial enlargement LVH with secondary repolarization abnormality Baseline wander No significant change since last tracing Confirmed by Pryor Curia [phone]) on 06/05/2017 6:46:38 AM   Radiology Ct Chest Wo Contrast  Result Date: 06/05/2017 CLINICAL DATA:  Worsening shortness of breath for several days. History of hypertension, thoracic aortic aneurysm. EXAM: CT CHEST WITHOUT CONTRAST TECHNIQUE: Multidetector CT imaging of the chest was performed following the standard protocol without IV contrast. COMPARISON:  Chest radiograph June 05, 2017 FINDINGS: CARDIOVASCULAR: Heart size is moderately enlarged, increased from prior CT. No pericardial effusion. Status post thoracic aorta endograft,  similar noncontrast appearance. Calcific atherosclerosis aortic arch. Mild coronary artery calcifications. MEDIASTINUM/NODES: Worsening mediastinal lymphadenopathy measuring  to 17 mm short axis, previously 13 mm. Assessment of lymphadenopathy limited by noncontrast CT. LUNGS/PLEURA: Small RIGHT pleural effusion. Multifocal consolidation bilateral lungs with perihilar predominance. Faint alveolar airspace opacities. Bibasilar atelectasis. Tracheobronchial tree is patent. UPPER ABDOMEN: Nonacute. 2.6 cm cyst LEFT interpolar kidney, incompletely imaged. Minimal cholelithiasis, incompletely assessed. MUSCULOSKELETAL: Nonacute. Status post median sternotomy. Mild degenerative change of lumbar spine, moderate T11-12 degenerative disc. IMPRESSION: 1. Multifocal consolidation seen with pulmonary edema and/or pneumonia. Small RIGHT pleural effusion. 2. Worsening moderate cardiomegaly. 3. Status post endograft repair of thoracic aortic aneurysm. Aortic Atherosclerosis (ICD10-I70.0). Electronically Signed   By: Elon Alas M.D.   On: 06/05/2017 13:19   Dg Chest Port 1 View  Result Date: 06/05/2017 CLINICAL DATA:  Shortness of breath today. EXAM: PORTABLE CHEST 1 VIEW COMPARISON:  CT chest 01/26/2016. FINDINGS: Extensive bilateral airspace disease is present with the most confluent opacities in the right mid and upper lung zones. Small to moderate bilateral pleural effusions are seen. No pneumothorax. Stent graft for aortic aneurysm repair is identified. No pneumothorax. IMPRESSION: Multifocal airspace disease most confluent in the right upper and mid lung zones worrisome for pneumonia. Coexistent pulmonary edema is also possible. Electronically Signed   By: Inge Rise M.D.   On: 06/05/2017 09:13    Procedures Procedures (including critical care time)  CRITICAL CARE Performed by: Recardo Evangelist   Total critical care time: 40 minutes  Critical care time was exclusive of separately billable  procedures and treating other patients.  Critical care was necessary to treat or prevent imminent or life-threatening deterioration.  Critical care was time spent personally by me on the following activities: development of treatment plan with patient and/or surrogate as well as nursing, discussions with consultants, evaluation of patient's response to treatment, examination of patient, obtaining history from patient or surrogate, ordering and performing treatments and interventions, ordering and review of laboratory studies, ordering and review of radiographic studies, pulse oximetry and re-evaluation of patient's condition.   Medications Ordered in ED Medications  hydrALAZINE (APRESOLINE) injection 20 mg (20 mg Intravenous Given 06/05/17 0819)  aspirin chewable tablet 324 mg (324 mg Oral Given 06/05/17 0818)  furosemide (LASIX) injection 40 mg (40 mg Intravenous Given 06/05/17 0905)  LORazepam (ATIVAN) injection 0.5 mg (0.5 mg Intravenous Given 06/05/17 0928)     Initial Impression / Assessment and Plan / ED Course  I have reviewed the triage vital signs and the nursing notes.  Pertinent labs & imaging results that were available during my care of the patient were reviewed by me and considered in my medical decision making (see chart for details).  62 year old female presents with worsening SOB over the past couple of days. She is markedly hypertensive, tachypneic, hypoxic (89%). On exam, she is anxious and has increased work of breathing. Crackles heard on lung exam. She has a loud heart murmur. Abdomen is soft, non-tender. Extremities are cool. She has marked peripheral edema. Will initiate labs, EKG, CXR. Discussed with Dr. Johnney Killian who also evaluated the patient.  8:49 AM Called in to room after the patient became more SOB. She is in distress, diaphoretic, and hypoxic again with 2L of O2. She was placed on NRB and sats improved. Bipap and Lasix 40mg  ordered. Lactic acid is elevated 2.28.  Troponin is elevated at .19. Portable CXR is remarkable for pulmonary edema.  9:27 AM BMP is remarkable for hyperkalemia (5.2), low bicarb (18), elevated BUN/SCr (40/3.66). LFTs are overall unremarkable. She is much more comfortable on Bipap.  Blood pressure and O2 sats have improved. She is more bradycardic with HR in the 40s-60s.  1:21 PM Echo is remarkable for drop in EF to 35%. She has severe tricuspid disease as well. BNP is >4,500. Doppler of the bilateral lower extremities are negative. Non-contrast CT is pending. Dr. Colvin Caroli had conversations with vascular, cards, and critical care. Dr. Elsworth Soho with critical care will come to evaluate the patient in the ED.  2:20 PM CT is remarkable for pulmonary edema, cardiomegaly, right pleural effusion, thoracic aneurysm graft. Critical care to admit.   Final Clinical Impressions(s) / ED Diagnoses   Final diagnoses:  Acute congestive heart failure, unspecified heart failure type ([place])  Tricuspid valve insufficiency, unspecified etiology  AKI (acute kidney injury) [facility])  Hyperkalemia    ED Discharge Orders    None       Recardo Evangelist, PA-C 06/05/17 1421    Charlesetta Shanks, MD 06/05/17 1517

## 2017-06-05 NOTE — Consult Note (Signed)
Cardiology Consultation   Patient ID: Diana Walters; [id number]; [birth year]   Admit date: 06/05/2017 Date of Consult: 06/05/2017  Referring Provider:  Elsworth Soho  Primary Care Provider: Renato Shin, MD Cardiologist: Dorris Carnes Electrophysiologist:  NA  Reason for Consultation: new-onset HF  History of Present Illness: Diana Walters is a 62 y.o. female who is being seen today for the evaluation of new-onset diminished LVEF at the request of pulm/CC service, Dr. Elsworth Soho. She has with hypertension, thoracic aortic aneurysm status post stent repair at [facility] in 2015 with small contained leak and a history of CVA, brought in by EMS 4/21 for acute respiratory distress that seems to have developed over the last 2 days.  She has a dry cough, no fevers or sputum production denies associated chest pain.  Chest x-ray showed bilateral infiltrates and she was placed on BiPAP in the emergency room.  Troponin was 0.19 and labs showed new acute renal failure with creatinine increased from 1.8-3.6. Pt says she has never had any sx quite like this in the past.  An echo was done earlier today showing EF has decreased from 55-60% in nov 2018 to now 35%. Cardiology called to see pt due to new-onset HF.    Past Medical History:  Diagnosis Date  . ANEMIA 10/17/2009  . ANXIETY 10/28/2006  . Arthritis   . ASTHMA 10/28/2006   no inhalers per pt  . Blood transfusion without reported diagnosis   . Depression   . Duodenitis without mention of hemorrhage 2005  . Heart murmur   . HYPERCHOLESTEROLEMIA 04/01/2008  . HYPERTENSION 10/28/2006  . Menopause    age 56  . Osteoporosis    bil knees  . SEIZURE DISORDER 04/01/2008  . Stroke ([place]) 11/2012  . 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 ESOPHAGOGASTRODUODENOSCOPY  01/29/2004  . 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 LOOP RECORDER IMPLANT N/A 12/01/2012   Procedure: LOOP RECORDER IMPLANT;  Surgeon: Coralyn Mark, MD;  Location: [place] [facility] CATH LAB;  Service: Cardiovascular;  Laterality: N/A;  . TEE WITHOUT CARDIOVERSION N/A 12/01/2012   Procedure: TRANSESOPHAGEAL ECHOCARDIOGRAM (TEE);  Surgeon: Fay Records, MD;  Location: [facility] ENDOSCOPY;  Service: Cardiovascular;  Laterality: N/A;      Current Medications: . heparin  5,000 Units Subcutaneous Q8H    Infused Medications: . sodium chloride      PRN Medications: sodium chloride, acetaminophen, hydrALAZINE, ondansetron (ZOFRAN) IV   No current facility-administered medications on file prior to encounter.    Current Outpatient Medications on File Prior to Encounter  Medication Sig Dispense Refill  . amLODipine (NORVASC) 10 MG tablet Take 1 tablet (10 mg total) by mouth daily. 90 tablet 3  . aspirin EC 81 MG tablet Take 81 mg by mouth daily.    Marland Kitchen atorvastatin (LIPITOR) 40 MG tablet TAKE 1 TABLET BY MOUTH EVERY DAY 90 tablet 2  . diphenhydrAMINE (BENADRYL) 25 mg capsule Take 25 mg by mouth daily as needed for allergies (TAKES THE DAY OF INFUSION).    . ferrous sulfate 325 (65 FE) MG tablet Take 1 tablet (325 mg total) daily with breakfast by mouth. 30 tablet 3  . hydrALAZINE (APRESOLINE) 50 MG tablet Take 1.5 tablets (75 mg total) by mouth 3 (three) times daily. 135 tablet 6  . InFLIXimab (REMICADE IV) Inject into the vein. At rheumatology    . levETIRAcetam (KEPPRA) 1000 MG tablet  Take 1 tablet (1,000 mg total) by mouth 2 (two) times daily. 60 tablet 11  . methotrexate 2.5 MG tablet Take 7.5 mg by mouth once a week. Friday    . metoprolol succinate (TOPROL-XL) 25 MG 24 hr tablet Take 1 tablet (25 mg total) by mouth daily. Take with or immediately following a meal. 30 tablet 0  . Multiple Vitamin (MULTIVITAMIN) tablet Take 1 tablet by mouth daily.      Marland Kitchen triamterene-hydrochlorothiazide (MAXZIDE-25) 37.5-25 MG tablet Take 0.5 tablets by mouth daily.      Allergies:    Allergies  Allergen Reactions    . Codeine Other (See Comments)    hallucinations  . Ibuprofen     Can't take due to medications    Social History:   The patient  reports that she has been smoking cigarettes.  She has a 6.00 pack-year smoking history. She has never used smokeless tobacco. She reports that she drinks alcohol. She reports that she has current or past drug history. Drug: Marijuana.    Family History:   The patient's family history includes Breast cancer in her mother; Heart disease in her mother.   ROS:  Please see the history of present illness.  All other ROS reviewed and negative.     Vital Signs: Blood pressure (!) 165/85, pulse 61, temperature 97.9 F (36.6 C), temperature source Oral, resp. rate (!) 25, height 5\' 7"  (1.702 m), weight 72.6 kg (160 lb), SpO2 100 %.   PHYSICAL EXAM: General:  Well nourished, well developed, in no acute distress, currently on BiPAP HEENT: normal Lymph: no adenopathy Neck: no JVD Endocrine:  No thryomegaly Vascular: No carotid bruits; DP pulses 2+ bilaterally  Cardiac:  normal S1, S2; RRR w/ occas irregularity. 2-3/6 sys murmur along LSB Lungs:  Currently on BiPAP. No rales/rhonchi anteriorly Abd: soft, nontender, no hepatomegaly  Ext: warm and well-perfused. 1-2+ bilateral pitting edema Musculoskeletal:  No deformities, BUE and BLE strength normal and equal Skin: warm and dry  Neuro:  CNs 2-12 intact, no focal abnormalities noted Psych:  Normal affect   EKG:  06-05-17 NSR with PACs vs marked sinus arrhythmia, HR 57 Prolonged QTc (539ms)  Labs: Recent Labs    06/05/17 1324  TROPONINI 0.21*   Recent Labs    06/05/17 0814  TROPIPOC 0.19*    Lab Results  Component Value Date   WBC 15.3 (H) 06/05/2017   HGB 8.5 (L) 06/05/2017   HCT 25.0 (L) 06/05/2017   MCV 70.7 (L) 06/05/2017   PLT 411 (H) 06/05/2017   Recent Labs  Lab 06/05/17 0811 06/05/17 0852  NA 142 140  K 5.2* 5.8*  CL 109 112*  CO2 18*  --   BUN 40* 52*  CREATININE 3.66* 3.60*   CALCIUM 8.4*  --   PROT 7.6  --   BILITOT 1.6*  --   ALKPHOS 76  --   ALT 14  --   AST 43*  --   GLUCOSE 132* 121*   Lab Results  Component Value Date   CHOL 162 05/18/2016   HDL 48 05/18/2016   LDLCALC 95 05/18/2016   TRIG 97 05/18/2016   No results found for: DDIMER  Radiology/Studies:  Ct Chest Wo Contrast  Result Date: 06/05/2017 CLINICAL DATA:  Worsening shortness of breath for several days. History of hypertension, thoracic aortic aneurysm. EXAM: CT CHEST WITHOUT CONTRAST TECHNIQUE: Multidetector CT imaging of the chest was performed following the standard protocol without IV contrast. COMPARISON:  Chest radiograph  June 05, 2017 FINDINGS: CARDIOVASCULAR: Heart size is moderately enlarged, increased from prior CT. No pericardial effusion. Status post thoracic aorta endograft, similar noncontrast appearance. Calcific atherosclerosis aortic arch. Mild coronary artery calcifications. MEDIASTINUM/NODES: Worsening mediastinal lymphadenopathy measuring to 17 mm short axis, previously 13 mm. Assessment of lymphadenopathy limited by noncontrast CT. LUNGS/PLEURA: Small RIGHT pleural effusion. Multifocal consolidation bilateral lungs with perihilar predominance. Faint alveolar airspace opacities. Bibasilar atelectasis. Tracheobronchial tree is patent. UPPER ABDOMEN: Nonacute. 2.6 cm cyst LEFT interpolar kidney, incompletely imaged. Minimal cholelithiasis, incompletely assessed. MUSCULOSKELETAL: Nonacute. Status post median sternotomy. Mild degenerative change of lumbar spine, moderate T11-12 degenerative disc. IMPRESSION: 1. Multifocal consolidation seen with pulmonary edema and/or pneumonia. Small RIGHT pleural effusion. 2. Worsening moderate cardiomegaly. 3. Status post endograft repair of thoracic aortic aneurysm. Aortic Atherosclerosis (ICD10-I70.0). Electronically Signed   By: Elon Alas M.D.   On: 06/05/2017 13:19   Dg Chest Port 1 View  Result Date: 06/05/2017 CLINICAL DATA:   Shortness of breath today. EXAM: PORTABLE CHEST 1 VIEW COMPARISON:  CT chest 01/26/2016. FINDINGS: Extensive bilateral airspace disease is present with the most confluent opacities in the right mid and upper lung zones. Small to moderate bilateral pleural effusions are seen. No pneumothorax. Stent graft for aortic aneurysm repair is identified. No pneumothorax. IMPRESSION: Multifocal airspace disease most confluent in the right upper and mid lung zones worrisome for pneumonia. Coexistent pulmonary edema is also possible. Electronically Signed   By: Inge Rise M.D.   On: 06/05/2017 09:13   Dg Abd Portable 1v  Result Date: 06/05/2017 CLINICAL DATA:  Abdominal distension EXAM: PORTABLE ABDOMEN - 1 VIEW COMPARISON:  01/03/2015 FINDINGS: Scattered large and small bowel gas is noted without obstructive change. No free air is noted. Findings of prior thoracic aortic stent graft are seen. Calcified uterine fibroid is noted. No acute bony abnormality is noted. IMPRESSION: No acute abnormality noted. Electronically Signed   By: Inez Catalina M.D.   On: 06/05/2017 22:22   TTE 01-14-17 Left ventricle: The cavity size was normal. Wall thickness was   increased in a pattern of mild LVH. Systolic function was normal.   The estimated ejection fraction was in the range of 55% to 60%.   Wall motion was normal; there were no regional wall motion   abnormalities. Features are consistent with a pseudonormal left   ventricular filling pattern, with concomitant abnormal relaxation   and increased filling pressure (grade 2 diastolic dysfunction). - Aortic valve: There was trivial regurgitation. - Mitral valve: Calcified annulus. Mildly thickened leaflets . - Right ventricle: The cavity size was mildly dilated. Impressions: - Normal LV systolic function; mild LVH; moderate diastolic   dysfunction; sclerotic aortic valve with trace AI; aortic arch   and descending aorta not visualized; mild RVE.  TTE  06-05-17 Left ventricle: Inferior , septal and apical hypokinesis The   cavity size was moderately dilated. Wall thickness was increased   in a pattern of mild LVH. Systolic function was moderately   reduced. The estimated ejection fraction was in the range of 35%   to 40%. Doppler parameters are consistent with both elevated   ventricular end-diastolic filling pressure and elevated left   atrial filling pressure. - Aortic valve: Calcified right coronary cusp. There was mild   regurgitation. - Mitral valve: Severe posterior annular calcification. There was   mild regurgitation. - Left atrium: The atrium was mildly dilated. - Atrial septum: No defect or patent foramen ovale was identified. - Tricuspid valve: There was moderate-severe regurgitation.  ASSESSMENT AND PLAN:  1. LV systolic dysfunction/CM: will need LHC to eval for obstructive CAD, but needs further diuresis and hopefully improvement in renal function prior to cath being done. Mild trop leak, but so far peak<1.0. No ST elevation on EKG. Would cont conservative therapy for now for possible CAD. In regard to LV dysfunction, pt has improved w/ diuresis; would cont IV diuresis as tolerated. No BB given occasional brady into 40s, asymptomatic for now. Would hold off on ACEi or ARB given renal dysfunction. Will have HF service see her in the AM.  2. Arrhythmias: NSR with occasional sinus arrhythmia, brady at times into the 40s. Last K was 5.8; this may be electrolyte-induced. Would treat the hyperkalemia and avoid BB for now.  3.  thoracic aortic aneurysm: status post stent repair at [facility] in 2015  4. HTN: BP has been a little high. Would treat w/ PRN's for now; avoid CCB given LV dysfunction, BB due to bradyarrhythmia, ACEi/ARB due to ARF.   5. Dyslipidemia: cont home regimen for now  Thank you for the opportunity to participate in the care of thisThank you for the opportunity to participate in the care of this very pleasant  patient. Will follow. Please call w/ questions.   Signed, Rudean Curt, MD, [facility]  06/05/2017 10:53 PM

## 2017-06-05 NOTE — ED Triage Notes (Signed)
Pt reports having increasing shortness of breath for the last several days that would get better than worse.

## 2017-06-05 NOTE — Progress Notes (Signed)
Pt transported to CT with BIPAP and back.

## 2017-06-05 NOTE — ED Notes (Signed)
Vascular at pt bedside.

## 2017-06-05 NOTE — Progress Notes (Signed)
[facility] Progress Note Patient Name: Diana Walters DOB: [birth year] MRN: [id number]   Date of Service  06/05/2017  HPI/Events of Note  Bradycardia - Patient c/o abdominal and flank pain. Now is having episodes of bradycardia into the 30's. Vasovagal? Was on metoprolol for BP control, however, metoprolol now stopped.   eICU Interventions  Will order:  1. Place NGT to LIS. 2. KUB film s/p NGT placement.  3. Will ask ground team to evaluate at bedside.      Intervention Category Major Interventions: Arrhythmia - evaluation and management  Traci Plemons Cornelia Copa 06/05/2017, 9:46 PM

## 2017-06-05 NOTE — Progress Notes (Signed)
LE venous duplex prelim: negative for DVT. Kadden Osterhout Eunice, RDMS, RVT  

## 2017-06-05 NOTE — ED Provider Notes (Signed)
Patient states she is been short of breath for several days.  It was gradual in onset.  At first she just noticed that she was more short of breath with talking.  Got much worse over the past couple of days now.  She feels short of breath at rest.  Very short of breath with exertion.  Denies any chest pain.  Some dry cough, no fever.  A lot of swelling in the legs.  No pain in the legs or the calves.  Patient reports she does have high blood pressure.  She reports is not been treated since about October of last year.  She reports that is because "they" wanted to test her kidneys but needed her off of her blood pressure medication.  In someway this has some apparent relationship to her rheumatoid arthritis and treatment.  Patient denies she has any headache or blurred vision.  Patient is alert and appropriate.  Mild to moderate increased work of breathing at rest.  Speaking in full clear sentences.  Heart regular with occasional ectopy.  3\6 diastolic ejection murmur heard best at left sternal border.  Breath sounds diminished at left base.  Subtle crackles right base and left midlung field.  No appreciable wheezing or rhonchi.  Abdomen is soft and nontender.  2-3+ pitting edema bilateral lower extremities.  2+ dorsalis pedis pulses bilaterally.  Calves nontender.  Skin condition of the feet and lower legs is very good.  No wounds, no cellulitis.  Consultation with Dr. Doren Custard 09: 51 reviewed patient's history and current presentation.  After discussion, advised that if patient needed heparin, could proceed, his endovascular graft likely is not actively bleeding and of risk-benefit is in favor of heparin will need to proceed.  Will follow along if needed however if no vascular issues relating to his stents are present will likely not need vascular consult.  Consult: Dr. Domenic Polite cardiology 09 50 reviewed patient's current presentation and past medical history.  Reviewed current diagnostic studies.  At this time  suspect demand ischemia as etiology for mild troponin elevation.  Less likely to be cardiac ischemic.  From perspective of current discussion regarding cardiac presentation, would not advise empiric heparin.   Consult: Reviewed with Dr. Elsworth Soho intensivist.  Reviewed the patient's history of present illness and medical history as well as current management.  He will do admission orders for planned transfer to [facility].  CRITICAL CARE Performed by: Si Gaul   Total critical care time:30 minutes  Critical care time was exclusive of separately billable procedures and treating other patients.  Critical care was necessary to treat or prevent imminent or life-threatening deterioration.  Critical care was time spent personally by me on the following activities: development of treatment plan with patient and/or surrogate as well as nursing, discussions with consultants, evaluation of patient's response to treatment, examination of patient, obtaining history from patient or surrogate, ordering and performing treatments and interventions, ordering and review of laboratory studies, ordering and review of radiographic studies, pulse oximetry and re-evaluation of patient's condition.     Charlesetta Shanks, MD 06/05/17 [phone]

## 2017-06-05 NOTE — ED Notes (Signed)
[facility] has been notified. 

## 2017-06-05 NOTE — ED Notes (Signed)
ED TO INPATIENT HANDOFF REPORT  Name/Age/Gender Diana Walters 62 y.o. female  Code Status    Code Status Orders  (From admission, onward)        Start     Ordered   06/05/17 1406  Full code  Continuous     06/05/17 1407    Code Status History    Date Active Date Inactive Code Status Order ID Comments User Context   11/29/2012 1548 12/01/2012 2020 Full Code [id number]  Verlee Monte, MD Inpatient      Home/SNF/Other Home  Chief Complaint shob/feet swelling/constipation  Level of Care/Admitting Diagnosis ED Disposition    ED Disposition Condition [place]: [facility] [100100]  Level of Care: ICU [6]  Diagnosis: Acute respiratory failure ([place]) [518.81.ICD-9-CM]  Admitting Physician: Rigoberto Noel [[id number]]  Attending Physician: Rigoberto Noel [[id number]]  Estimated length of stay: past midnight tomorrow  Certification:: I certify this patient will need inpatient services for at least 2 midnights  PT Class (Do Not Modify): Inpatient [101]  PT Acc Code (Do Not Modify): Private [1]       Medical History Past Medical History:  Diagnosis Date  . ANEMIA 10/17/2009  . ANXIETY 10/28/2006  . Arthritis   . ASTHMA 10/28/2006   no inhalers per pt  . Blood transfusion without reported diagnosis   . Depression   . Duodenitis without mention of hemorrhage 2005  . Heart murmur   . HYPERCHOLESTEROLEMIA 04/01/2008  . HYPERTENSION 10/28/2006  . Menopause    age 52  . Osteoporosis    bil knees  . SEIZURE DISORDER 04/01/2008  . Stroke ([place]) 11/2012  . THORACIC AORTIC ANEURYSM 04/01/2008    Allergies Allergies  Allergen Reactions  . Codeine Other (See Comments)    hallucinations  . Ibuprofen     Can't take due to medications    IV Location/Drains/Wounds Patient Lines/Drains/Airways Status   Active Line/Drains/Airways    Name:   Placement date:   Placement time:   Site:   Days:   Peripheral IV 06/05/17 Right Antecubital   06/05/17     0811    Antecubital   less than 1   Peripheral IV 06/05/17 Left Antecubital   06/05/17    0914    Antecubital   less than 1          Labs/Imaging Results for orders placed or performed during the hospital encounter of 06/05/17 (from the past 48 hour(s))  Basic metabolic panel     Status: Abnormal   Collection Time: 06/05/17  8:11 AM  Result Value Ref Range   Sodium 142 135 - 145 mmol/L   Potassium 5.2 (H) 3.5 - 5.1 mmol/L    Comment: MODERATE HEMOLYSIS   Chloride 109 101 - 111 mmol/L   CO2 18 (L) 22 - 32 mmol/L   Glucose, Bld 132 (H) 65 - 99 mg/dL   BUN 40 (H) 6 - 20 mg/dL   Creatinine, Ser 3.66 (H) 0.44 - 1.00 mg/dL   Calcium 8.4 (L) 8.9 - 10.3 mg/dL   GFR calc non Af Amer 12 (L) >60 mL/min   GFR calc Af Amer 14 (L) >60 mL/min    Comment: (NOTE) The eGFR has been calculated using the CKD EPI equation. This calculation has not been validated in all clinical situations. eGFR's persistently <60 mL/min signify possible Chronic Kidney Disease.    Anion gap 15 5 - 15    Comment: Performed at [facility], [place] [address], [place], [place] [address]  Hepatic function panel     Status: Abnormal   Collection Time: 06/05/17  8:11 AM  Result Value Ref Range   Total Protein 7.6 6.5 - 8.1 g/dL   Albumin 2.7 (L) 3.5 - 5.0 g/dL   AST 43 (H) 15 - 41 U/L   ALT 14 14 - 54 U/L   Alkaline Phosphatase 76 38 - 126 U/L   Total Bilirubin 1.6 (H) 0.3 - 1.2 mg/dL   Bilirubin, Direct 0.5 0.1 - 0.5 mg/dL   Indirect Bilirubin 1.1 (H) 0.3 - 0.9 mg/dL    Comment: Performed at [facility], [place] [address] [place], [place] [address]  Magnesium     Status: None   Collection Time: 06/05/17  8:11 AM  Result Value Ref Range   Magnesium 2.2 1.7 - 2.4 mg/dL    Comment: Performed at [facility], [place] [address] [place], [place] [address]  Phosphorus     Status: Abnormal   Collection Time: 06/05/17  8:11 AM  Result Value Ref Range   Phosphorus 5.4  (H) 2.5 - 4.6 mg/dL    Comment: Performed at [facility], [place] [address], [place], [place] [address]  TSH     Status: None   Collection Time: 06/05/17  8:11 AM  Result Value Ref Range   TSH 1.092 0.350 - 4.500 uIU/mL    Comment: Performed by a 3rd Generation assay with a functional sensitivity of <=0.01 uIU/mL. Performed at [facility], [place] [address], [place],  [address]   I-stat troponin, ED     Status: Abnormal   Collection Time: 06/05/17  8:14 AM  Result Value Ref Range   Troponin i, poc 0.19 (HH) 0.00 - 0.08 ng/mL   Comment NOTIFIED PHYSICIAN    Comment 3            Comment: Due to the release kinetics of cTnI, a negative result within the first hours of the onset of symptoms does not rule out myocardial infarction with certainty. If myocardial infarction is still suspected, repeat the test at appropriate intervals.   I-Stat CG4 Lactic Acid, ED     Status: Abnormal   Collection Time: 06/05/17  8:16 AM  Result Value Ref Range   Lactic Acid, Venous 2.28 (HH) 0.5 - 1.9 mmol/L   Comment NOTIFIED PHYSICIAN   I-stat chem 8, ed     Status: Abnormal   Collection Time: 06/05/17  8:52 AM  Result Value Ref Range   Sodium 140 135 - 145 mmol/L   Potassium 5.8 (H) 3.5 - 5.1 mmol/L   Chloride 112 (H) 101 - 111 mmol/L   BUN 52 (H) 6 - 20 mg/dL   Creatinine, Ser 3.60 (H) 0.44 - 1.00 mg/dL   Glucose, Bld 121 (H) 65 - 99 mg/dL   Calcium, Ion 1.02 (L) 1.15 - 1.40 mmol/L   TCO2 22 22 - 32 mmol/L   Hemoglobin 8.5 (L) 12.0 - 15.0 g/dL   HCT 25.0 (L) 36.0 - 46.0 %  I-Stat CG4 Lactic Acid, ED     Status: Abnormal   Collection Time: 06/05/17 10:38 AM  Result Value Ref Range   Lactic Acid, Venous 1.92 (H) 0.5 - 1.9 mmol/L  Troponin I     Status: Abnormal   Collection Time: 06/05/17  1:24 PM  Result Value Ref Range   Troponin I 0.21 (HH) <0.03 ng/mL    Comment: CRITICAL RESULT  CALLED TO, READ BACK BY AND VERIFIED WITH: TAI,RN AT 1710 ON  06/05/2017 BY MOSLEY,J Performed at [facility], [place] [address] [place], [place] [address]    Ct Chest Wo Contrast  Result Date: 06/05/2017 CLINICAL DATA:  Worsening shortness of breath for several days. History of hypertension, thoracic aortic aneurysm. EXAM: CT CHEST WITHOUT CONTRAST TECHNIQUE: Multidetector CT imaging of the chest was performed following the standard protocol without IV contrast. COMPARISON:  Chest radiograph June 05, 2017 FINDINGS: CARDIOVASCULAR: Heart size is moderately enlarged, increased from prior CT. No pericardial effusion. Status post thoracic aorta endograft, similar noncontrast appearance. Calcific atherosclerosis aortic arch. Mild coronary artery calcifications. MEDIASTINUM/NODES: Worsening mediastinal lymphadenopathy measuring to 17 mm short axis, previously 13 mm. Assessment of lymphadenopathy limited by noncontrast CT. LUNGS/PLEURA: Small RIGHT pleural effusion. Multifocal consolidation bilateral lungs with perihilar predominance. Faint alveolar airspace opacities. Bibasilar atelectasis. Tracheobronchial tree is patent. UPPER ABDOMEN: Nonacute. 2.6 cm cyst LEFT interpolar kidney, incompletely imaged. Minimal cholelithiasis, incompletely assessed. MUSCULOSKELETAL: Nonacute. Status post median sternotomy. Mild degenerative change of lumbar spine, moderate T11-12 degenerative disc. IMPRESSION: 1. Multifocal consolidation seen with pulmonary edema and/or pneumonia. Small RIGHT pleural effusion. 2. Worsening moderate cardiomegaly. 3. Status post endograft repair of thoracic aortic aneurysm. Aortic Atherosclerosis (ICD10-I70.0). Electronically Signed   By: Elon Alas M.D.   On: 06/05/2017 13:19   Dg Chest Port 1 View  Result Date: 06/05/2017 CLINICAL DATA:  Shortness of breath today. EXAM: PORTABLE CHEST 1 VIEW COMPARISON:  CT chest 01/26/2016. FINDINGS: Extensive bilateral airspace disease is present with the most confluent opacities in the right  mid and upper lung zones. Small to moderate bilateral pleural effusions are seen. No pneumothorax. Stent graft for aortic aneurysm repair is identified. No pneumothorax. IMPRESSION: Multifocal airspace disease most confluent in the right upper and mid lung zones worrisome for pneumonia. Coexistent pulmonary edema is also possible. Electronically Signed   By: Inge Rise M.D.   On: 06/05/2017 09:13    Pending Labs Unresulted Labs (From admission, onward)   Start     Ordered   06/06/17 0500  CBC  Tomorrow morning,   R     06/05/17 1407   06/06/17 3748  Basic metabolic panel  Tomorrow morning,   R     06/05/17 1407   06/06/17 0500  Magnesium  Tomorrow morning,   R     06/05/17 1407   06/06/17 0500  Phosphorus  Tomorrow morning,   R     06/05/17 1407   06/05/17 2707  Basic metabolic panel  Once,   R     06/05/17 1408   06/05/17 1407  Troponin I  Now then every 6 hours,   R     06/05/17 1407   06/05/17 0726  Urinalysis, Routine w reflex microscopic  STAT,   STAT     06/05/17 0728   06/05/17 0654  CBC with Differential  Once,   STAT     06/05/17 0653      Vitals/Pain Today's Vitals   06/05/17 1500 06/05/17 1515 06/05/17 1545 06/05/17 1630  BP: 133/74 129/67 (!) 116/58 128/79  Pulse: (!) 44 (!) 41 (!) 45 (!) 55  Resp: (!) 24 (!) 23 (!) 21 20  Temp:      TempSrc:      SpO2: 98% 100% 100% 100%  Weight:      Height:      PainSc:        Isolation Precautions No active isolations  Medications  Medications  0.9 %  sodium chloride infusion (has no administration in time range)  heparin injection 5,000 Units (has no administration in time range)  acetaminophen (TYLENOL) tablet 650 mg (has no administration in time range)  ondansetron (ZOFRAN) injection 4 mg (has no administration in time range)  hydrALAZINE (APRESOLINE) injection 10-40 mg (has no administration in time range)  hydrALAZINE (APRESOLINE) injection 20 mg (20 mg Intravenous Given 06/05/17 0819)  aspirin chewable  tablet 324 mg (324 mg Oral Given 06/05/17 0818)  furosemide (LASIX) injection 40 mg (40 mg Intravenous Given 06/05/17 0905)  LORazepam (ATIVAN) injection 0.5 mg (0.5 mg Intravenous Given 06/05/17 0928)    Mobility non-ambulatory

## 2017-06-06 ENCOUNTER — Inpatient Hospital Stay (HOSPITAL_COMMUNITY): Payer: Medicare Other

## 2017-06-06 DIAGNOSIS — N189 Chronic kidney disease, unspecified: Secondary | ICD-10-CM

## 2017-06-06 DIAGNOSIS — I5031 Acute diastolic (congestive) heart failure: Secondary | ICD-10-CM

## 2017-06-06 LAB — RESPIRATORY PANEL BY PCR
ADENOVIRUS-RVPPCR: NOT DETECTED
BORDETELLA PERTUSSIS-RVPCR: NOT DETECTED
CORONAVIRUS 229E-RVPPCR: NOT DETECTED
CORONAVIRUS HKU1-RVPPCR: NOT DETECTED
CORONAVIRUS NL63-RVPPCR: NOT DETECTED
Chlamydophila pneumoniae: NOT DETECTED
Coronavirus OC43: NOT DETECTED
Influenza A: NOT DETECTED
Influenza B: NOT DETECTED
METAPNEUMOVIRUS-RVPPCR: NOT DETECTED
Mycoplasma pneumoniae: NOT DETECTED
PARAINFLUENZA VIRUS 2-RVPPCR: NOT DETECTED
PARAINFLUENZA VIRUS 3-RVPPCR: NOT DETECTED
Parainfluenza Virus 1: NOT DETECTED
Parainfluenza Virus 4: NOT DETECTED
RHINOVIRUS / ENTEROVIRUS - RVPPCR: NOT DETECTED
Respiratory Syncytial Virus: NOT DETECTED

## 2017-06-06 LAB — BASIC METABOLIC PANEL
ANION GAP: 14 (ref 5–15)
BUN: 41 mg/dL — ABNORMAL HIGH (ref 6–20)
CHLORIDE: 110 mmol/L (ref 101–111)
CO2: 16 mmol/L — AB (ref 22–32)
CREATININE: 3.89 mg/dL — AB (ref 0.44–1.00)
Calcium: 7.8 mg/dL — ABNORMAL LOW (ref 8.9–10.3)
GFR calc non Af Amer: 11 mL/min — ABNORMAL LOW (ref >60)
GFR, EST AFRICAN AMERICAN: 13 mL/min — AB (ref >60)
Glucose, Bld: 91 mg/dL (ref 65–99)
POTASSIUM: 4.1 mmol/L (ref 3.5–5.1)
SODIUM: 140 mmol/L (ref 135–145)

## 2017-06-06 LAB — LACTIC ACID, PLASMA: Lactic Acid, Venous: 1.6 mmol/L (ref 0.5–1.9)

## 2017-06-06 LAB — PROCALCITONIN: PROCALCITONIN: 3.46 ng/mL

## 2017-06-06 LAB — CBC
HCT: 23.8 % — ABNORMAL LOW (ref 36.0–46.0)
Hemoglobin: 7.9 g/dL — ABNORMAL LOW (ref 12.0–15.0)
MCH: 23.3 pg — AB (ref 26.0–34.0)
MCHC: 33.2 g/dL (ref 30.0–36.0)
MCV: 70.2 fL — AB (ref 78.0–100.0)
Platelets: 361 10*3/uL (ref 150–400)
RBC: 3.39 MIL/uL — AB (ref 3.87–5.11)
RDW: 20.8 % — ABNORMAL HIGH (ref 11.5–15.5)
WBC: 17 10*3/uL — AB (ref 4.0–10.5)

## 2017-06-06 LAB — MAGNESIUM: Magnesium: 2.1 mg/dL (ref 1.7–2.4)

## 2017-06-06 LAB — TROPONIN I
Troponin I: 0.15 ng/mL (ref <0.03)
Troponin I: 0.11 ng/mL (ref <0.03)

## 2017-06-06 LAB — PHOSPHORUS: PHOSPHORUS: 5 mg/dL — AB (ref 2.5–4.6)

## 2017-06-06 MED ORDER — METOPROLOL SUCCINATE ER 25 MG PO TB24
25.0000 mg | ORAL_TABLET | Freq: Two times a day (BID) | ORAL | Status: DC
  Administered 2017-06-06 – 2017-06-07 (×3): 25 mg via ORAL
  Filled 2017-06-06 (×4): qty 1

## 2017-06-06 MED ORDER — FUROSEMIDE 80 MG PO TABS
80.0000 mg | ORAL_TABLET | Freq: Two times a day (BID) | ORAL | Status: DC
  Administered 2017-06-06 – 2017-06-08 (×4): 80 mg via ORAL
  Filled 2017-06-06 (×4): qty 1

## 2017-06-06 MED ORDER — AZITHROMYCIN 500 MG PO TABS
500.0000 mg | ORAL_TABLET | Freq: Every day | ORAL | Status: DC
Start: 2017-06-06 — End: 2017-06-10
  Administered 2017-06-06 – 2017-06-10 (×5): 500 mg via ORAL
  Filled 2017-06-06 (×5): qty 1

## 2017-06-06 MED ORDER — DEXTROSE 50 % IV SOLN: 1.0000 | Freq: Once | INTRAVENOUS | Status: DC

## 2017-06-06 MED ORDER — HYDRALAZINE HCL 25 MG PO TABS
25.0000 mg | ORAL_TABLET | Freq: Four times a day (QID) | ORAL | Status: DC
  Administered 2017-06-06 – 2017-06-07 (×3): 25 mg via ORAL
  Filled 2017-06-06 (×3): qty 1

## 2017-06-06 MED ORDER — ATORVASTATIN CALCIUM 40 MG PO TABS
40.0000 mg | ORAL_TABLET | Freq: Every day | ORAL | Status: DC
  Administered 2017-06-06 – 2017-06-11 (×6): 40 mg via ORAL
  Filled 2017-06-06 (×7): qty 1

## 2017-06-06 MED ORDER — LORAZEPAM 2 MG/ML IJ SOLN
0.5000 mg | INTRAMUSCULAR | Status: DC | PRN
  Administered 2017-06-07: 0.5 mg via INTRAVENOUS
  Filled 2017-06-06: qty 1

## 2017-06-06 MED ORDER — HYDRALAZINE HCL 10 MG PO TABS
10.0000 mg | ORAL_TABLET | Freq: Four times a day (QID) | ORAL | Status: DC
  Administered 2017-06-06: 10 mg via ORAL
  Filled 2017-06-06: qty 1

## 2017-06-06 MED ORDER — AMLODIPINE BESYLATE 5 MG PO TABS
5.0000 mg | ORAL_TABLET | Freq: Two times a day (BID) | ORAL | Status: DC
  Administered 2017-06-06 – 2017-06-13 (×15): 5 mg via ORAL
  Filled 2017-06-06 (×15): qty 1

## 2017-06-06 MED ORDER — INSULIN ASPART 100 UNIT/ML IV SOLN: 10.0000 [IU] | Freq: Once | INTRAVENOUS | Status: DC

## 2017-06-06 MED ORDER — FUROSEMIDE 10 MG/ML IJ SOLN
40.0000 mg | Freq: Once | INTRAMUSCULAR | Status: AC
  Administered 2017-06-06: 40 mg via INTRAVENOUS
  Filled 2017-06-06: qty 4

## 2017-06-06 MED ORDER — SODIUM CHLORIDE 0.9 % IV SOLN
1.0000 g | Freq: Once | INTRAVENOUS | Status: DC
  Filled 2017-06-06: qty 10

## 2017-06-06 MED ORDER — FUROSEMIDE 40 MG PO TABS: 40.0000 mg | ORAL_TABLET | Freq: Two times a day (BID) | ORAL | Status: DC

## 2017-06-06 NOTE — Progress Notes (Signed)
PULMONARY / CRITICAL CARE MEDICINE   Name: Diana Walters MRN: [id number] DOB: [birth year]    ADMISSION DATE:  06/05/2017    CHIEF COMPLAINT: Dyspnea  HISTORY OF PRESENT ILLNESS:        62 year old woman with hypertension, thoracic aortic aneurysm status post stent repair at [facility] in 2015 with small contained leak and a history of CVA, brought in by EMS 4/21 for acute respiratory distress that seems to have developed over the last 2 days.  She has a dry cough, no fevers or sputum production denies chest pain.  Chest x-ray showed bilateral infiltrates and she was placed on BiPAP in the emergency room.  Troponin was 0.19 and labs showed new acute renal failure with creatinine increased from 1.8-3.6.  Echocardiogram showed a decrease in ejection fraction from approximately 50% to 35% with inferior septal and apical hypokinesis,. significant TR was also noted.   She received 1 dose of Lasix and no antibiotics overnight.  Cardiology was consulted as well as vascular and P CCM asked to admit.       This morning she is breathing comfortably on room air.  She still reports to me a nonproductive cough.  She denies any chest pain in the recent past.  She has had some chills at home but no overt fevers.  There is no one else in the home that is sick.  Of note she is chronically immunosuppressed and receives monthly Remicade for rheumatoid arthritis    PAST MEDICAL HISTORY :  She  has a past medical history of ANEMIA (10/17/2009), ANXIETY (10/28/2006), Arthritis, ASTHMA (10/28/2006), Blood transfusion without reported diagnosis, Depression, Duodenitis without mention of hemorrhage (2005), Heart murmur, HYPERCHOLESTEROLEMIA (04/01/2008), HYPERTENSION (10/28/2006), Menopause, Osteoporosis, SEIZURE DISORDER (04/01/2008), Stroke ([place]) (11/2012), and THORACIC AORTIC ANEURYSM (04/01/2008).  PAST SURGICAL HISTORY: She  has a past surgical history that includes Esophagogastroduodenoscopy (01/29/2004); TEE without  cardioversion (N/A, 12/01/2012); Loop recorder implant (12-01-2012); and loop recorder implant (N/A, 12/01/2012).  Allergies  Allergen Reactions  . Codeine Other (See Comments)    hallucinations  . Ibuprofen     Can't take due to medications    No current facility-administered medications on file prior to encounter.    Current Outpatient Medications on File Prior to Encounter  Medication Sig  . amLODipine (NORVASC) 10 MG tablet Take 1 tablet (10 mg total) by mouth daily.  Marland Kitchen aspirin EC 81 MG tablet Take 81 mg by mouth daily.  Marland Kitchen atorvastatin (LIPITOR) 40 MG tablet TAKE 1 TABLET BY MOUTH EVERY DAY  . diphenhydrAMINE (BENADRYL) 25 mg capsule Take 25 mg by mouth daily as needed for allergies (TAKES THE DAY OF INFUSION).  . ferrous sulfate 325 (65 FE) MG tablet Take 1 tablet (325 mg total) daily with breakfast by mouth.  . hydrALAZINE (APRESOLINE) 50 MG tablet Take 1.5 tablets (75 mg total) by mouth 3 (three) times daily.  . InFLIXimab (REMICADE IV) Inject into the vein. At rheumatology  . levETIRAcetam (KEPPRA) 1000 MG tablet Take 1 tablet (1,000 mg total) by mouth 2 (two) times daily.  . methotrexate 2.5 MG tablet Take 7.5 mg by mouth once a week. Friday  . metoprolol succinate (TOPROL-XL) 25 MG 24 hr tablet Take 1 tablet (25 mg total) by mouth daily. Take with or immediately following a meal.  . Multiple Vitamin (MULTIVITAMIN) tablet Take 1 tablet by mouth daily.    Marland Kitchen triamterene-hydrochlorothiazide (MAXZIDE-25) 37.5-25 MG tablet Take 0.5 tablets by mouth daily.    FAMILY HISTORY:  Her indicated that  her mother is deceased. She indicated that her father is deceased. She indicated that her maternal grandmother is deceased. She indicated that her maternal grandfather is deceased. She indicated that her paternal grandmother is deceased. She indicated that her paternal grandfather is deceased. She indicated that the status of her neg hx is unknown.   SOCIAL HISTORY: She  reports that she  has been smoking cigarettes.  She has a 6.00 pack-year smoking history. She has never used smokeless tobacco. She reports that she drinks alcohol. She reports that she has current or past drug history. Drug: Marijuana.  REVIEW OF SYSTEMS:     SUBJECTIVE:  As above  VITAL SIGNS: BP (!) 160/43   Pulse (!) 34   Temp 98.4 F (36.9 C) (Axillary)   Resp (!) 21   Ht 5\' 7"  (1.702 m)   Wt 147 lb 4.3 oz (66.8 kg)   SpO2 100%   BMI 23.07 kg/m   HEMODYNAMICS:    VENTILATOR SETTINGS:    INTAKE / OUTPUT: I/O last 3 completed shifts: In: -  Out: 300 [Urine:300]  PHYSICAL EXAMINATION: General: She is in remarkably no distress and able to speak in full sentences despite the findings on echo and CT of the chest Neuro: She is alert and oriented and appropriately interactive Cardiovascular: He has 2+ lower extremity edema and no JVD.  S1 and S2 are regular and there is a 3 out of 6 systolic ejection murmur. Lungs: Respirations are unlabored, there is symmetric air movement, some scattered rales and rhonchi.  No wheezes  Adomen: The abdomen is soft without any organomegaly masses or tenderness.   LABS:  BMET Recent Labs  Lab 06/05/17 0811 06/05/17 0852 06/06/17 0115  NA 142 140 140  K 5.2* 5.8* 4.1  CL 109 112* 110  CO2 18*  --  16*  BUN 40* 52* 41*  CREATININE 3.66* 3.60* 3.89*  GLUCOSE 132* 121* 91    Electrolytes Recent Labs  Lab 06/05/17 0811 06/06/17 0115  CALCIUM 8.4* 7.8*  MG 2.2 2.1  PHOS 5.4* 5.0*    CBC Recent Labs  Lab 06/05/17 0846 06/05/17 0852 06/06/17 0115  WBC 15.3*  --  17.0*  HGB 7.7* 8.5* 7.9*  HCT 24.1* 25.0* 23.8*  PLT 411*  --  361    Coag's Recent Labs  Lab 06/05/17 0846  INR 1.26    Sepsis Markers Recent Labs  Lab 06/05/17 0816 06/05/17 1038 06/06/17 0115  LATICACIDVEN 2.28* 1.92* 1.6  PROCALCITON  --   --  3.46    ABG No results for input(s): PHART, PCO2ART, PO2ART in the last 168 hours.  Liver Enzymes Recent Labs   Lab 06/05/17 0811  AST 43*  ALT 14  ALKPHOS 76  BILITOT 1.6*  ALBUMIN 2.7*    Cardiac Enzymes Recent Labs  Lab 06/05/17 1324 06/06/17 0115 06/06/17 0658  TROPONINI 0.21* 0.15* 0.11*    Glucose No results for input(s): GLUCAP in the last 168 hours.  Imaging Ct Chest Wo Contrast  Result Date: 06/05/2017 CLINICAL DATA:  Worsening shortness of breath for several days. History of hypertension, thoracic aortic aneurysm. EXAM: CT CHEST WITHOUT CONTRAST TECHNIQUE: Multidetector CT imaging of the chest was performed following the standard protocol without IV contrast. COMPARISON:  Chest radiograph June 05, 2017 FINDINGS: CARDIOVASCULAR: Heart size is moderately enlarged, increased from prior CT. No pericardial effusion. Status post thoracic aorta endograft, similar noncontrast appearance. Calcific atherosclerosis aortic arch. Mild coronary artery calcifications. MEDIASTINUM/NODES: Worsening mediastinal lymphadenopathy measuring to 17  mm short axis, previously 13 mm. Assessment of lymphadenopathy limited by noncontrast CT. LUNGS/PLEURA: Small RIGHT pleural effusion. Multifocal consolidation bilateral lungs with perihilar predominance. Faint alveolar airspace opacities. Bibasilar atelectasis. Tracheobronchial tree is patent. UPPER ABDOMEN: Nonacute. 2.6 cm cyst LEFT interpolar kidney, incompletely imaged. Minimal cholelithiasis, incompletely assessed. MUSCULOSKELETAL: Nonacute. Status post median sternotomy. Mild degenerative change of lumbar spine, moderate T11-12 degenerative disc. IMPRESSION: 1. Multifocal consolidation seen with pulmonary edema and/or pneumonia. Small RIGHT pleural effusion. 2. Worsening moderate cardiomegaly. 3. Status post endograft repair of thoracic aortic aneurysm. Aortic Atherosclerosis (ICD10-I70.0). Electronically Signed   By: Elon Alas M.D.   On: 06/05/2017 13:19   Dg Chest Port 1 View  Result Date: 06/06/2017 CLINICAL DATA:  Respiratory failure, hypoxia,  history of asthma. Previous thoracic aortic stent graft placement. EXAM: PORTABLE CHEST 1 VIEW COMPARISON:  Chest x-ray and chest CT scan of June 05, 2017 FINDINGS: The lungs are well-expanded. Increased interstitial and airspace opacities persist. The cardiac silhouette is enlarged. The pulmonary vascularity is not clearly engorged. There is no pleural effusion or pneumothorax. An aortic stent graft is present throughout the course of the thorax. The sternal wires are intact. IMPRESSION: Bilateral airspace opacities compatible with pneumonia or pulmonary edema. There may have been slight interval improvement since yesterday's study. Electronically Signed   By: David  [place]ique M.D.   On: 06/06/2017 07:47   Dg Abd Portable 1v  Result Date: 06/05/2017 CLINICAL DATA:  Abdominal distension EXAM: PORTABLE ABDOMEN - 1 VIEW COMPARISON:  01/03/2015 FINDINGS: Scattered large and small bowel gas is noted without obstructive change. No free air is noted. Findings of prior thoracic aortic stent graft are seen. Calcified uterine fibroid is noted. No acute bony abnormality is noted. IMPRESSION: No acute abnormality noted. Electronically Signed   By: Inez Catalina M.D.   On: 06/05/2017 22:22     STUDIES:  There is scattered patchy densities with no overt proclivity for dependent areas.  CULTURES: None   ANTIBIOTICS: I started azithromycin this morning    DISCUSSION:      This is a 62 year old who is immunosuppressed for rheumatoid arthritis who presented with dyspnea.  She has a history of thoracic aorta stenting for proximal and distal aortic aneurysm.  Echocardiogram shows new LV dysfunction and she gives no history to suggest an acute cardiac event.  ASSESSMENT / PLAN:  PULMONARY A: She has patchy infiltrates and an elevated white count.  I have ordered a respiratory viral panel and empirically initiated azithromycin for community-acquired pneumonia.  Should she decline I would anticipate bronchoscopy to  rule out an opportunistic infection     CARDIOVASCULAR A: Newly discovered LV dysfunction but without a clinical history of an acute event.  Mission enzymes are not remarkable.  I have ordered a scheduled dose of Lasix and hydralazine for afterload reduction pending further input from cardiology.  She has been recently bradycardic and is not a candidate for beta-blockade.  RENAL A: Renal function will be monitored as she is diuresed     Lars Masson, MD [place] Pager: [phone]  06/06/2017, 10:22 AM

## 2017-06-06 NOTE — Progress Notes (Signed)
Patient resting comfortably at this time with no respiratory distress noted. BIPAP is not needed at this time. RT will monitor as needed. 

## 2017-06-06 NOTE — Progress Notes (Signed)
[facility] Progress Note Patient Name: Diana Walters DOB: 1955-03-26 MRN: [id number]   Date of Service  06/06/2017  HPI/Events of Note  K+ now = 4.1.   eICU Interventions  Will D/C Calcium gluconate, Novolog Insulin and D50 orders.      Intervention Category Major Interventions: Electrolyte abnormality - evaluation and management  Rishi Vicario Eugene 06/06/2017, 2:43 AM

## 2017-06-06 NOTE — Progress Notes (Signed)
RT removed patient from bipap and placed on 2lnc. Vital signs stable at this time. Patient tolerating well. RT will continue to monitor.

## 2017-06-06 NOTE — Progress Notes (Addendum)
[facility] Progress Note Patient Name: Diana Walters DOB: September 26, 1955 MRN: [id number]   Date of Service  06/06/2017  HPI/Events of Note  Hyperkalemia - K+ = 5.8 and Creatinine = 3.60. With abdominal discomfort and N/V will avoid Kayexalate PO.   eICU Interventions  Will order: 1. D50 1 amp IV now.  2. Novolog Insulin 10 units now. 3. Calcium gluconate 1 gm IV now.  4. Repeat BMP at 5 AM.     Intervention Category Major Interventions: Electrolyte abnormality - evaluation and management  Merlene Dante Eugene 06/06/2017, 12:28 AM

## 2017-06-06 NOTE — Progress Notes (Signed)
   06/06/17 1100  Clinical Encounter Type  Visited With Patient  Visit Type Follow-up  Referral From Nurse  Consult/Referral To Chaplain  Spiritual Encounters  Spiritual Needs Prayer;Emotional  Stress Factors  Patient Stress Factors Health changes  Chaplain was requested to visit the PT.  Chaplain talked about life, health, finances and recovery from sickness.  Chaplain prayed and PT was tearful at the end of the prayer.  Chaplain will revisit the advanced directive with the PT.

## 2017-06-06 NOTE — Progress Notes (Signed)
Progress Note  Patient Name: Diana Walters Date of Encounter: 06/06/2017  Primary Cardiologist: Harrington Challenger  Subjective   Breatiing is better  No CP    Inpatient Medications    Scheduled Meds: . azithromycin  500 mg Oral Daily  . furosemide  40 mg Oral BID  . heparin  5,000 Units Subcutaneous Q8H  . hydrALAZINE  10 mg Oral Q6H   Continuous Infusions: . sodium chloride     PRN Meds: sodium chloride, acetaminophen, hydrALAZINE, LORazepam, ondansetron (ZOFRAN) IV   Vital Signs    Vitals:   06/06/17 1100 06/06/17 1135 06/06/17 1158 06/06/17 1200  BP: (!) 158/42 (!) 158/42  (!) 180/55  Pulse: (!) 35  (!) 35 (!) 34  Resp: (!) 25  20 (!) 22  Temp:    98.3 F (36.8 C)  TempSrc:      SpO2: 100%  100% 100%  Weight:      Height:        Intake/Output Summary (Last 24 hours) at 06/06/2017 1348 Last data filed at 06/06/2017 0400 Gross per 24 hour  Intake -  Output 300 ml  Net -300 ml   Filed Weights   06/05/17 0641 06/06/17 0442  Weight: 160 lb (72.6 kg) 147 lb 4.3 oz (66.8 kg)    Telemetry    SR - Personally Reviewed  ECG      Physical Exam   GEN: No acute distress.  Sittiing in bed eating lunch   Neck: JVP is increased   Cardiac: RRR, no murmurs, rubs, or gallops.  Respiratory: Rales at bases   GI: Soft, nontender, non-distended  MS: 1+   Edema legs   L hand swollen  No deformity. Neuro:  Nonfocal  Psych: Normal affect   Labs    Chemistry Recent Labs  Lab 06/05/17 0811 06/05/17 0852 06/06/17 0115  NA 142 140 140  K 5.2* 5.8* 4.1  CL 109 112* 110  CO2 18*  --  16*  GLUCOSE 132* 121* 91  BUN 40* 52* 41*  CREATININE 3.66* 3.60* 3.89*  CALCIUM 8.4*  --  7.8*  PROT 7.6  --   --   ALBUMIN 2.7*  --   --   AST 43*  --   --   ALT 14  --   --   ALKPHOS 76  --   --   BILITOT 1.6*  --   --   GFRNONAA 12*  --  11*  GFRAA 14*  --  13*  ANIONGAP 15  --  14     Hematology Recent Labs  Lab 06/05/17 0846 06/05/17 0852 06/06/17 0115  WBC 15.3*  --   17.0*  RBC 3.41*  --  3.39*  HGB 7.7* 8.5* 7.9*  HCT 24.1* 25.0* 23.8*  MCV 70.7*  --  70.2*  MCH 22.6*  --  23.3*  MCHC 32.0  --  33.2  RDW 20.9*  --  20.8*  PLT 411*  --  361    Cardiac Enzymes Recent Labs  Lab 06/05/17 1324 06/06/17 0115 06/06/17 0658  TROPONINI 0.21* 0.15* 0.11*    Recent Labs  Lab 06/05/17 0814  TROPIPOC 0.19*     BNP Recent Labs  Lab 06/05/17 0846  BNP >4,500.0*     DDimer No results for input(s): DDIMER in the last 168 hours.   Radiology    Ct Chest Wo Contrast  Result Date: 06/05/2017 CLINICAL DATA:  Worsening shortness of breath for several days. History of hypertension, thoracic  aortic aneurysm. EXAM: CT CHEST WITHOUT CONTRAST TECHNIQUE: Multidetector CT imaging of the chest was performed following the standard protocol without IV contrast. COMPARISON:  Chest radiograph June 05, 2017 FINDINGS: CARDIOVASCULAR: Heart size is moderately enlarged, increased from prior CT. No pericardial effusion. Status post thoracic aorta endograft, similar noncontrast appearance. Calcific atherosclerosis aortic arch. Mild coronary artery calcifications. MEDIASTINUM/NODES: Worsening mediastinal lymphadenopathy measuring to 17 mm short axis, previously 13 mm. Assessment of lymphadenopathy limited by noncontrast CT. LUNGS/PLEURA: Small RIGHT pleural effusion. Multifocal consolidation bilateral lungs with perihilar predominance. Faint alveolar airspace opacities. Bibasilar atelectasis. Tracheobronchial tree is patent. UPPER ABDOMEN: Nonacute. 2.6 cm cyst LEFT interpolar kidney, incompletely imaged. Minimal cholelithiasis, incompletely assessed. MUSCULOSKELETAL: Nonacute. Status post median sternotomy. Mild degenerative change of lumbar spine, moderate T11-12 degenerative disc. IMPRESSION: 1. Multifocal consolidation seen with pulmonary edema and/or pneumonia. Small RIGHT pleural effusion. 2. Worsening moderate cardiomegaly. 3. Status post endograft repair of thoracic aortic  aneurysm. Aortic Atherosclerosis (ICD10-I70.0). Electronically Signed   By: Elon Alas M.D.   On: 06/05/2017 13:19   Dg Chest Port 1 View  Result Date: 06/06/2017 CLINICAL DATA:  Respiratory failure, hypoxia, history of asthma. Previous thoracic aortic stent graft placement. EXAM: PORTABLE CHEST 1 VIEW COMPARISON:  Chest x-ray and chest CT scan of June 05, 2017 FINDINGS: The lungs are well-expanded. Increased interstitial and airspace opacities persist. The cardiac silhouette is enlarged. The pulmonary vascularity is not clearly engorged. There is no pleural effusion or pneumothorax. An aortic stent graft is present throughout the course of the thorax. The sternal wires are intact. IMPRESSION: Bilateral airspace opacities compatible with pneumonia or pulmonary edema. There may have been slight interval improvement since yesterday's study. Electronically Signed   By: David  Martinique M.D.   On: 06/06/2017 07:47   Dg Chest Port 1 View  Result Date: 06/05/2017 CLINICAL DATA:  Shortness of breath today. EXAM: PORTABLE CHEST 1 VIEW COMPARISON:  CT chest 01/26/2016. FINDINGS: Extensive bilateral airspace disease is present with the most confluent opacities in the right mid and upper lung zones. Small to moderate bilateral pleural effusions are seen. No pneumothorax. Stent graft for aortic aneurysm repair is identified. No pneumothorax. IMPRESSION: Multifocal airspace disease most confluent in the right upper and mid lung zones worrisome for pneumonia. Coexistent pulmonary edema is also possible. Electronically Signed   By: Inge Rise M.D.   On: 06/05/2017 09:13   Dg Abd Portable 1v  Result Date: 06/05/2017 CLINICAL DATA:  Abdominal distension EXAM: PORTABLE ABDOMEN - 1 VIEW COMPARISON:  01/03/2015 FINDINGS: Scattered large and small bowel gas is noted without obstructive change. No free air is noted. Findings of prior thoracic aortic stent graft are seen. Calcified uterine fibroid is noted. No acute  bony abnormality is noted. IMPRESSION: No acute abnormality noted. Electronically Signed   By: Inez Catalina M.D.   On: 06/05/2017 22:22    Cardiac Studies    Patient Profile     62 y.o. female history of HTN, RA, thoracic aneurysm(s/p stent repair 2015 with small contained leake), CVA  Admitted with SOB over past 2 wks    Assessment & Plan    1  Acute systolic CHF   Pt familiar to me from clinic   THis is new for her   Says she felt good until about 2 wks ago   Denies CP    No prior viral infection Presented in resp distress.yesterday  Patient says she is feeling better Still with volume increase on exam  I/O show no signif  diureiss.  WIll increase lasix   To 80 IV bid   WIll ask renal to see pt  TSH pending as well as ESR    Defer further eval until fluid and renal status improve  2   CKD  Cr signif elevated from last fall   Would ask renal To see pt as well  3   HTN  WIll increase hydralazine  To 25 q 6 hours (was on 75 tid prior)  Will add back amlodipine as well as low dose toprol XL 25 (don't want rebound)  4  Pulm  CCM following as well  WIl review with them  5  Hx RA   WIll check ESR   Was on MTX prior    6  Hx TAA   Keep on statin        For questions or updates, please contact [facility] Please consult [email or web address] for contact info under Cardiology/STEMI.      Signed, Dorris Carnes, MD  06/06/2017, 1:48 PM

## 2017-06-06 NOTE — Progress Notes (Signed)
PCCM Interval Note  Patient reportedly had abdominal pain and tachypnea.   On bedside assessment patient denies abdominal pain. KUB is negative for acute process. Abdomen is soft, non-tender, non-distend with active bowel sounds. Patient currently on BiPAP, crackles to bases, RR 25-30. States she is anxious and would like something to help her.   Lasix 40 meq ordered > Heart Failure to see in AM  Ativan PRN   Hayden Pedro, AGACNP-BC [facility]  Pgr: [phone]  PCCM Pgr: [phone]

## 2017-06-07 LAB — BASIC METABOLIC PANEL
Anion gap: 10 (ref 5–15)
BUN: 53 mg/dL — ABNORMAL HIGH (ref 6–20)
CO2: 19 mmol/L — ABNORMAL LOW (ref 22–32)
Calcium: 7.5 mg/dL — ABNORMAL LOW (ref 8.9–10.3)
Chloride: 109 mmol/L (ref 101–111)
Creatinine, Ser: 4.12 mg/dL — ABNORMAL HIGH (ref 0.44–1.00)
GFR calc Af Amer: 12 mL/min — ABNORMAL LOW (ref >60)
GFR calc non Af Amer: 11 mL/min — ABNORMAL LOW (ref >60)
Glucose, Bld: 111 mg/dL — ABNORMAL HIGH (ref 65–99)
Potassium: 3.5 mmol/L (ref 3.5–5.1)
Sodium: 138 mmol/L (ref 135–145)

## 2017-06-07 LAB — TSH: TSH: 0.587 u[IU]/mL (ref 0.350–4.500)

## 2017-06-07 MED ORDER — HYDRALAZINE HCL 50 MG PO TABS
50.0000 mg | ORAL_TABLET | Freq: Four times a day (QID) | ORAL | Status: DC
  Administered 2017-06-07 – 2017-06-09 (×8): 50 mg via ORAL
  Filled 2017-06-07 (×8): qty 1

## 2017-06-07 NOTE — Progress Notes (Signed)
Progress Note  Patient Name: Diana Walters Date of Encounter: 06/07/2017  Primary Cardiologist: Harrington Challenger  Subjective   Breatiing is better  No CP    Inpatient Medications    Scheduled Meds: . amLODipine  5 mg Oral BID  . atorvastatin  40 mg Oral q1800  . azithromycin  500 mg Oral Daily  . furosemide  80 mg Oral BID  . heparin  5,000 Units Subcutaneous Q8H  . hydrALAZINE  50 mg Oral Q6H  . metoprolol succinate  25 mg Oral BID WC   Continuous Infusions: . sodium chloride     PRN Meds: sodium chloride, acetaminophen, hydrALAZINE, LORazepam, ondansetron (ZOFRAN) IV   Vital Signs    Vitals:   06/07/17 1100 06/07/17 1200 06/07/17 1300 06/07/17 1400  BP: (!) 143/63 (!) 143/70 (!) 160/61 (!) 161/78  Pulse: 66 67 67 74  Resp: (!) 25 (!) 24 (!) 25 17  Temp:  97.8 F (36.6 C)    TempSrc:  Oral    SpO2: 98% 97% 97% 100%  Weight:      Height:        Intake/Output Summary (Last 24 hours) at 06/07/2017 1419 Last data filed at 06/07/2017 1100 Gross per 24 hour  Intake 650 ml  Output 700 ml  Net -50 ml   Filed Weights   06/05/17 0641 06/06/17 0442 06/07/17 0500  Weight: 160 lb (72.6 kg) 147 lb 4.3 oz (66.8 kg) 144 lb 13.5 oz (65.7 kg)    Telemetry    SR - Personally Reviewed  ECG      Physical Exam   GEN: No acute distress.  Sittiing in bed eating lunch   Neck: JVP is increased   Cardiac: RRR, no murmurs, rubs, or gallops.  Respiratory: Rales at bases   GI: Soft, nontender, non-distended  MS: 1+   Edema legs   L hand swollen  No deformity. Neuro:  Nonfocal  Psych: Normal affect   Labs    Chemistry Recent Labs  Lab 06/05/17 0811 06/05/17 0852 06/06/17 0115 06/07/17 0429  NA 142 140 140 138  K 5.2* 5.8* 4.1 3.5  CL 109 112* 110 109  CO2 18*  --  16* 19*  GLUCOSE 132* 121* 91 111*  BUN 40* 52* 41* 53*  CREATININE 3.66* 3.60* 3.89* 4.12*  CALCIUM 8.4*  --  7.8* 7.5*  PROT 7.6  --   --   --   ALBUMIN 2.7*  --   --   --   AST 43*  --   --   --     ALT 14  --   --   --   ALKPHOS 76  --   --   --   BILITOT 1.6*  --   --   --   GFRNONAA 12*  --  11* 11*  GFRAA 14*  --  13* 12*  ANIONGAP 15  --  14 10     Hematology Recent Labs  Lab 06/05/17 0846 06/05/17 0852 06/06/17 0115  WBC 15.3*  --  17.0*  RBC 3.41*  --  3.39*  HGB 7.7* 8.5* 7.9*  HCT 24.1* 25.0* 23.8*  MCV 70.7*  --  70.2*  MCH 22.6*  --  23.3*  MCHC 32.0  --  33.2  RDW 20.9*  --  20.8*  PLT 411*  --  361    Cardiac Enzymes Recent Labs  Lab 06/05/17 1324 06/06/17 0115 06/06/17 0658  TROPONINI 0.21* 0.15* 0.11*  Recent Labs  Lab 06/05/17 0814  TROPIPOC 0.19*     BNP Recent Labs  Lab 06/05/17 0846  BNP >4,500.0*     DDimer No results for input(s): DDIMER in the last 168 hours.   Radiology    Dg Chest Port 1 View  Result Date: 06/06/2017 CLINICAL DATA:  Respiratory failure, hypoxia, history of asthma. Previous thoracic aortic stent graft placement. EXAM: PORTABLE CHEST 1 VIEW COMPARISON:  Chest x-ray and chest CT scan of June 05, 2017 FINDINGS: The lungs are well-expanded. Increased interstitial and airspace opacities persist. The cardiac silhouette is enlarged. The pulmonary vascularity is not clearly engorged. There is no pleural effusion or pneumothorax. An aortic stent graft is present throughout the course of the thorax. The sternal wires are intact. IMPRESSION: Bilateral airspace opacities compatible with pneumonia or pulmonary edema. There may have been slight interval improvement since yesterday's study. Electronically Signed   By: David  Martinique M.D.   On: 06/06/2017 07:47   Dg Abd Portable 1v  Result Date: 06/05/2017 CLINICAL DATA:  Abdominal distension EXAM: PORTABLE ABDOMEN - 1 VIEW COMPARISON:  01/03/2015 FINDINGS: Scattered large and small bowel gas is noted without obstructive change. No free air is noted. Findings of prior thoracic aortic stent graft are seen. Calcified uterine fibroid is noted. No acute bony abnormality is noted.  IMPRESSION: No acute abnormality noted. Electronically Signed   By: Inez Catalina M.D.   On: 06/05/2017 22:22    Cardiac Studies    Patient Profile     62 y.o. female history of HTN, RA, thoracic aneurysm(s/p stent repair 2015 with small contained leake), CVA  Admitted with SOB over past 2 wks    Assessment & Plan    1  Acute systolic CHF   Pt notes SOB and edema for the past couple weeks  No CP   No viral infection other than stuffy nose  Presented in resp distress. Echo yesterday showed LVEF 35 to 40%   This is down previous echo   Volume remains increased with Cr 4.12  TSH normal  ESR pending     2   CKD  Cr 4.12  Renal service to see pt    3   HTN  BP labile   High at times   Follow on same meds for now    4  Pulm  Pt on azithromycin    5  Hx RA    ESR pending   Was on MTX prior    6  Hx TAA   Keep on statin        For questions or updates, please contact [facility] Please consult [email or web address] for contact info under Cardiology/STEMI.      Signed, Dorris Carnes, MD  06/07/2017, 2:19 PM

## 2017-06-07 NOTE — Plan of Care (Signed)
Patient progression to room air from BiPap and ordered cardiac diet for intake. Pt with transfer orders to [facility] stepdown unit.

## 2017-06-07 NOTE — Progress Notes (Signed)
Pt does not want to be placed on CPAP at this time.  RN will call when Pt is ready.

## 2017-06-07 NOTE — Progress Notes (Signed)
Chaplain visited with PT concerning their AD.  The PT wants daughter to be present even though it was made clear that only the PT signs the document.    Clearing the consult from the system and nurse will page Chaplain once PT daughter has arrived.

## 2017-06-07 NOTE — Progress Notes (Signed)
PULMONARY / CRITICAL CARE MEDICINE   Name: Diana Walters MRN: [id number] DOB: [birth year]    ADMISSION DATE:  06/05/2017    CHIEF COMPLAINT: Dyspnea  HISTORY OF PRESENT ILLNESS:        62 year old woman with hypertension, thoracic aortic aneurysm status post stent repair at [facility] in 2015 with small contained leak and a history of CVA, brought in by EMS 4/21 for acute respiratory distress that seems to have developed over the last 2 days.  She has a dry cough, no fevers or sputum production denies chest pain.  Chest x-ray showed bilateral infiltrates and she was placed on BiPAP in the emergency room.  Troponin was 0.19 and labs showed new acute renal failure with creatinine increased from 1.8-3.6.  Echocardiogram showed a decrease in ejection fraction from approximately 50% to 35% with inferior septal and apical hypokinesis,. significant TR was also noted.   She received 1 dose of Lasix and no antibiotics overnight.  Cardiology was consulted as well as vascular and P CCM asked to admit.       She is again breathing comfortably on room air this morning.  She has a nonproductive cough.  She again denies any sort of chest pain.  He did not respond to an increased dose of Lasix overnight having produced only 500 cc in the past 24 hours.    PAST MEDICAL HISTORY :  She  has a past medical history of ANEMIA (10/17/2009), ANXIETY (10/28/2006), Arthritis, ASTHMA (10/28/2006), Blood transfusion without reported diagnosis, Depression, Duodenitis without mention of hemorrhage (2005), Heart murmur, HYPERCHOLESTEROLEMIA (04/01/2008), HYPERTENSION (10/28/2006), Menopause, Osteoporosis, SEIZURE DISORDER (04/01/2008), Stroke ([place]) (11/2012), and THORACIC AORTIC ANEURYSM (04/01/2008).  PAST SURGICAL HISTORY: She  has a past surgical history that includes Esophagogastroduodenoscopy (01/29/2004); TEE without cardioversion (N/A, 12/01/2012); Loop recorder implant (12-01-2012); and loop recorder implant (N/A,  12/01/2012).  Allergies  Allergen Reactions  . Codeine Other (See Comments)    hallucinations  . Ibuprofen     Can't take due to medications    No current facility-administered medications on file prior to encounter.    Current Outpatient Medications on File Prior to Encounter  Medication Sig  . amLODipine (NORVASC) 10 MG tablet Take 1 tablet (10 mg total) by mouth daily.  Marland Kitchen aspirin EC 81 MG tablet Take 81 mg by mouth daily.  Marland Kitchen atorvastatin (LIPITOR) 40 MG tablet TAKE 1 TABLET BY MOUTH EVERY DAY  . diphenhydrAMINE (BENADRYL) 25 mg capsule Take 25 mg by mouth daily as needed for allergies (TAKES THE DAY OF INFUSION).  . ferrous sulfate 325 (65 FE) MG tablet Take 1 tablet (325 mg total) daily with breakfast by mouth.  . hydrALAZINE (APRESOLINE) 50 MG tablet Take 1.5 tablets (75 mg total) by mouth 3 (three) times daily.  . InFLIXimab (REMICADE IV) Inject into the vein. At rheumatology  . levETIRAcetam (KEPPRA) 1000 MG tablet Take 1 tablet (1,000 mg total) by mouth 2 (two) times daily.  . methotrexate 2.5 MG tablet Take 7.5 mg by mouth once a week. Friday  . metoprolol succinate (TOPROL-XL) 25 MG 24 hr tablet Take 1 tablet (25 mg total) by mouth daily. Take with or immediately following a meal.  . Multiple Vitamin (MULTIVITAMIN) tablet Take 1 tablet by mouth daily.    Marland Kitchen triamterene-hydrochlorothiazide (MAXZIDE-25) 37.5-25 MG tablet Take 0.5 tablets by mouth daily.    FAMILY HISTORY:  Her indicated that her mother is deceased. She indicated that her father is deceased. She indicated that her maternal grandmother is deceased.  She indicated that her maternal grandfather is deceased. She indicated that her paternal grandmother is deceased. She indicated that her paternal grandfather is deceased. She indicated that the status of her neg hx is unknown.   SOCIAL HISTORY: She  reports that she has been smoking cigarettes.  She has a 6.00 pack-year smoking history. She has never used smokeless  tobacco. She reports that she drinks alcohol. She reports that she has current or past drug history. Drug: Marijuana.  REVIEW OF SYSTEMS:     SUBJECTIVE:  As above  VITAL SIGNS: BP (!) 121/106   Pulse (!) 53   Temp 98.4 F (36.9 C) (Oral)   Resp 20   Ht 5\' 7"  (1.702 m)   Wt 144 lb 13.5 oz (65.7 kg)   SpO2 96%   BMI 22.69 kg/m   HEMODYNAMICS:    VENTILATOR SETTINGS:    INTAKE / OUTPUT: I/O last 3 completed shifts: In: 170 [P.O.:170] Out: 800 [Urine:800]  PHYSICAL EXAMINATION: General: She is in no distress and able to speak in full sentences.   Neuro: She is alert and appropriately interactive.  She is moving all fours.   Cardiovascular: She has 2+ lower extremity edema.  S1 and S2 are currently regular and there is a 3 out of 6 systolic ejection murmur  Lungs: Respirations are unlabored, there is good air movement throughout.  There are no wheezes, there is some scattered rhonchi   Adomen: The abdomen is soft without any organomegaly masses or tenderness    LABS:  BMET Recent Labs  Lab 06/05/17 0811 06/05/17 0852 06/06/17 0115 06/07/17 0429  NA 142 140 140 138  K 5.2* 5.8* 4.1 3.5  CL 109 112* 110 109  CO2 18*  --  16* 19*  BUN 40* 52* 41* 53*  CREATININE 3.66* 3.60* 3.89* 4.12*  GLUCOSE 132* 121* 91 111*    Electrolytes Recent Labs  Lab 06/05/17 0811 06/06/17 0115 06/07/17 0429  CALCIUM 8.4* 7.8* 7.5*  MG 2.2 2.1  --   PHOS 5.4* 5.0*  --     CBC Recent Labs  Lab 06/05/17 0846 06/05/17 0852 06/06/17 0115  WBC 15.3*  --  17.0*  HGB 7.7* 8.5* 7.9*  HCT 24.1* 25.0* 23.8*  PLT 411*  --  361    Coag's Recent Labs  Lab 06/05/17 0846  INR 1.26    Sepsis Markers Recent Labs  Lab 06/05/17 0816 06/05/17 1038 06/06/17 0115  LATICACIDVEN 2.28* 1.92* 1.6  PROCALCITON  --   --  3.46    ABG No results for input(s): PHART, PCO2ART, PO2ART in the last 168 hours.  Liver Enzymes Recent Labs  Lab 06/05/17 0811  AST 43*  ALT 14   ALKPHOS 76  BILITOT 1.6*  ALBUMIN 2.7*    Cardiac Enzymes Recent Labs  Lab 06/05/17 1324 06/06/17 0115 06/06/17 0658  TROPONINI 0.21* 0.15* 0.11*    Glucose No results for input(s): GLUCAP in the last 168 hours.  Imaging No results found.   STUDIES:  There is scattered patchy densities with no overt proclivity for dependent areas.  CULTURES: None   ANTIBIOTICS: I started azithromycin this morning    DISCUSSION:      This is a 62 year old who is immunosuppressed for rheumatoid arthritis who presented with dyspnea.  She has a history of thoracic aorta stenting for proximal and distal aortic aneurysm.  Echocardiogram shows new LV dysfunction and she gives no history to suggest an acute cardiac event.  ASSESSMENT / PLAN:  PULMONARY A: She has patchy infiltrates and an elevated white count.  Respiratory pathogen PCR was negative.  I am going to continue her on empiric azithromycin today, maintaining some suspicion that some component of her dyspnea may have been secondary to infection.  I have ordered a sed rate for the morning as well.  Should her infiltrates fail to resolve as her volume status is controlled we may need to perform a bronchoscopy to rule out an opportunistic infection in this patient who is chronically immunosuppressed.   CARDIOVASCULAR A: Newly discovered LV dysfunction but without a clinical history of an acute event.  She has not responded to diuresis and I am anticipating that she may require dialysis for volume control.  I did increase her dose of hydralazine this morning to improve afterload reduction.  RENAL A: She is not diuresing and as noted I anticipate he will need dialysis for volume control.  In addition I anticipate that she will require cardiac catheterization on this admission and anticipate further decline in her renal function.  Nephrology has been consulted  Despite the above issues she is clinically stable and I feel can be safely  transferred to stepdown.  The critical care service will not routinely follow, if it appears that her respiratory status is not completely improved with volume control please reconsult [place] for potential bronchoscopy  Lars Masson, MD [place] Pager: [phone]  06/07/2017, 7:35 AM

## 2017-06-08 DIAGNOSIS — D649 Anemia, unspecified: Secondary | ICD-10-CM

## 2017-06-08 DIAGNOSIS — R001 Bradycardia, unspecified: Secondary | ICD-10-CM

## 2017-06-08 DIAGNOSIS — J9601 Acute respiratory failure with hypoxia: Secondary | ICD-10-CM

## 2017-06-08 LAB — URINALYSIS, ROUTINE W REFLEX MICROSCOPIC
Bilirubin Urine: NEGATIVE
Glucose, UA: NEGATIVE mg/dL
Ketones, ur: NEGATIVE mg/dL
Nitrite: NEGATIVE
PH: 5 (ref 5.0–8.0)
Protein, ur: 100 mg/dL — AB
Specific Gravity, Urine: 1.01 (ref 1.005–1.030)

## 2017-06-08 LAB — CBC WITH DIFFERENTIAL/PLATELET
BAND NEUTROPHILS: 0 %
BASOS ABS: 0 10*3/uL (ref 0.0–0.1)
BASOS PCT: 0 %
Blasts: 0 %
EOS ABS: 0.2 10*3/uL (ref 0.0–0.7)
Eosinophils Relative: 2 %
HCT: 20.6 % — ABNORMAL LOW (ref 36.0–46.0)
HEMOGLOBIN: 6.6 g/dL — AB (ref 12.0–15.0)
LYMPHS PCT: 12 %
Lymphs Abs: 1.3 10*3/uL (ref 0.7–4.0)
MCH: 22.1 pg — ABNORMAL LOW (ref 26.0–34.0)
MCHC: 32 g/dL (ref 30.0–36.0)
MCV: 69.1 fL — ABNORMAL LOW (ref 78.0–100.0)
METAMYELOCYTES PCT: 0 %
MONO ABS: 0.1 10*3/uL (ref 0.1–1.0)
MYELOCYTES: 0 %
Monocytes Relative: 1 %
Neutro Abs: 8.9 10*3/uL — ABNORMAL HIGH (ref 1.7–7.7)
Neutrophils Relative %: 85 %
OTHER: 0 %
PROMYELOCYTES RELATIVE: 0 %
Platelets: 359 10*3/uL (ref 150–400)
RBC: 2.98 MIL/uL — ABNORMAL LOW (ref 3.87–5.11)
RDW: 20 % — ABNORMAL HIGH (ref 11.5–15.5)
WBC: 10.5 10*3/uL (ref 4.0–10.5)
nRBC: 0 /100 WBC

## 2017-06-08 LAB — BASIC METABOLIC PANEL
Anion gap: 13 (ref 5–15)
BUN: 55 mg/dL — AB (ref 6–20)
CALCIUM: 7.6 mg/dL — AB (ref 8.9–10.3)
CO2: 19 mmol/L — ABNORMAL LOW (ref 22–32)
CREATININE: 4.01 mg/dL — AB (ref 0.44–1.00)
Chloride: 106 mmol/L (ref 101–111)
GFR calc Af Amer: 13 mL/min — ABNORMAL LOW (ref >60)
GFR calc non Af Amer: 11 mL/min — ABNORMAL LOW (ref >60)
GLUCOSE: 112 mg/dL — AB (ref 65–99)
Potassium: 3.4 mmol/L — ABNORMAL LOW (ref 3.5–5.1)
Sodium: 138 mmol/L (ref 135–145)

## 2017-06-08 LAB — CBC
HCT: 23.3 % — ABNORMAL LOW (ref 36.0–46.0)
HEMATOCRIT: 20.5 % — AB (ref 36.0–46.0)
Hemoglobin: 6.7 g/dL — CL (ref 12.0–15.0)
Hemoglobin: 7.6 g/dL — ABNORMAL LOW (ref 12.0–15.0)
MCH: 22.8 pg — ABNORMAL LOW (ref 26.0–34.0)
MCH: 23 pg — AB (ref 26.0–34.0)
MCHC: 32.7 g/dL (ref 30.0–36.0)
MCHC: 32.6 g/dL (ref 30.0–36.0)
MCV: 69.7 fL — AB (ref 78.0–100.0)
MCV: 70.6 fL — ABNORMAL LOW (ref 78.0–100.0)
PLATELETS: 408 10*3/uL — AB (ref 150–400)
Platelets: 361 10*3/uL (ref 150–400)
RBC: 2.94 MIL/uL — ABNORMAL LOW (ref 3.87–5.11)
RBC: 3.3 MIL/uL — ABNORMAL LOW (ref 3.87–5.11)
RDW: 20.2 % — AB (ref 11.5–15.5)
RDW: 20.6 % — AB (ref 11.5–15.5)
WBC: 9.5 10*3/uL (ref 4.0–10.5)
WBC: 9.1 10*3/uL (ref 4.0–10.5)

## 2017-06-08 LAB — PREPARE RBC (CROSSMATCH)

## 2017-06-08 LAB — SEDIMENTATION RATE: SED RATE: 121 mm/h — AB (ref 0–22)

## 2017-06-08 LAB — ABO/RH: ABO/RH(D): O POS

## 2017-06-08 MED ORDER — PANTOPRAZOLE SODIUM 40 MG PO TBEC
40.0000 mg | DELAYED_RELEASE_TABLET | Freq: Every day | ORAL | Status: DC
  Administered 2017-06-08 – 2017-06-13 (×6): 40 mg via ORAL
  Filled 2017-06-08 (×6): qty 1

## 2017-06-08 MED ORDER — FUROSEMIDE 10 MG/ML IJ SOLN: 60.0000 mg | Freq: Once | INTRAMUSCULAR | Status: DC

## 2017-06-08 MED ORDER — FUROSEMIDE 10 MG/ML IJ SOLN: 80.0000 mg | Freq: Once | INTRAMUSCULAR | Status: DC

## 2017-06-08 MED ORDER — SODIUM CHLORIDE 0.9 % IV SOLN: Freq: Once | INTRAVENOUS | Status: DC

## 2017-06-08 NOTE — Progress Notes (Addendum)
PULMONARY / CRITICAL CARE MEDICINE   Name: Diana Walters MRN: [id number] DOB: 08-Mar-1955    ADMISSION DATE:  06/05/2017    CHIEF COMPLAINT: Dyspnea  HISTORY OF PRESENT ILLNESS:        62 year old woman with hypertension, thoracic aortic aneurysm status post stent repair at [facility] in 2015 with small contained leak and a history of CVA, brought in by EMS 4/21 for acute respiratory distress that seems to have developed over the last 2 days.  She has a dry cough, no fevers or sputum production denies chest pain.  Chest x-ray showed bilateral infiltrates and she was placed on BiPAP in the emergency room.  Troponin was 0.19 and labs showed new acute renal failure with creatinine increased from 1.8-3.6.  Echocardiogram showed a decrease in ejection fraction from approximately 50% to 35% with inferior septal and apical hypokinesis,. significant TR was also noted.   She received 1 dose of Lasix and no antibiotics overnight.  Cardiology was consulted as well as vascular and P CCM asked to admit.       She is again breathing comfortably on room air this morning.  She has a nonproductive cough.  She again denies any sort of chest pain.  He did not respond to an increased dose of Lasix overnight having produced only 500 cc in the past 24 hours.  06/08/17 Patient was transferred out of ICU but Hospitalist have not picked up yet so seen on floor On RA sitting up in chair not in any distress No bleeding reported but HB is 6.6 and received one unit PRBC Patient was not taking her home meds since December   PAST MEDICAL HISTORY :  She  has a past medical history of ANEMIA (10/17/2009), ANXIETY (10/28/2006), Arthritis, ASTHMA (10/28/2006), Blood transfusion without reported diagnosis, Depression, Duodenitis without mention of hemorrhage (2005), Heart murmur, HYPERCHOLESTEROLEMIA (04/01/2008), HYPERTENSION (10/28/2006), Menopause, Osteoporosis, SEIZURE DISORDER (04/01/2008), Stroke ([place]) (11/2012), and THORACIC AORTIC  ANEURYSM (04/01/2008).  PAST SURGICAL HISTORY: She  has a past surgical history that includes Esophagogastroduodenoscopy (01/29/2004); TEE without cardioversion (N/A, 12/01/2012); Loop recorder implant (12-01-2012); and loop recorder implant (N/A, 12/01/2012).   FAMILY HISTORY:  Her indicated that her mother is deceased. She indicated that her father is deceased. She indicated that her maternal grandmother is deceased. She indicated that her maternal grandfather is deceased. She indicated that her paternal grandmother is deceased. She indicated that her paternal grandfather is deceased. She indicated that the status of her neg hx is unknown.   SOCIAL HISTORY: She  reports that she has been smoking cigarettes.  She has a 6.00 pack-year smoking history. She has never used smokeless tobacco. She reports that she drinks alcohol. She reports that she has current or past drug history. Drug: Marijuana.  REVIEW OF SYSTEMS:     SUBJECTIVE:  As above  VITAL SIGNS: BP (!) 154/66   Pulse 70   Temp 97.8 F (36.6 C) (Oral)   Resp 18   Ht 5\' 7"  (1.702 m)   Wt 65.9 kg (145 lb 4.5 oz)   SpO2 100%   BMI 22.75 kg/m   HEMODYNAMICS:    VENTILATOR SETTINGS:    INTAKE / OUTPUT: I/O last 3 completed shifts: In: 990 [P.O.:990] Out: 1100 [Urine:1100]  PHYSICAL EXAMINATION: General: She is in no distress and able to speak in full sentences.   Neuro: She is alert and appropriately interactive.  She is moving all fours.   Cardiovascular: She has 2+ lower extremity edema.  S1 and  S2 are currently regular and there is a 3 out of 6 systolic ejection murmur  Lungs: Respirations are unlabored, there is good air movement throughout.  There are no wheezes, there is some scattered rhonchi   Adomen: The abdomen is soft without any organomegaly masses or tenderness    LABS:  BMET Recent Labs  Lab 06/06/17 0115 06/07/17 0429 06/08/17 0150  NA 140 138 138  K 4.1 3.5 3.4*  CL 110 109 106  CO2 16*  19* 19*  BUN 41* 53* 55*  CREATININE 3.89* 4.12* 4.01*  GLUCOSE 91 111* 112*    Electrolytes Recent Labs  Lab 06/05/17 0811 06/06/17 0115 06/07/17 0429 06/08/17 0150  CALCIUM 8.4* 7.8* 7.5* 7.6*  MG 2.2 2.1  --   --   PHOS 5.4* 5.0*  --   --     CBC Recent Labs  Lab 06/08/17 0150 06/08/17 0552 06/08/17 1041  WBC 10.5 9.5 9.1  HGB 6.6* 6.7* 7.6*  HCT 20.6* 20.5* 23.3*  PLT 359 361 408*    Coag's Recent Labs  Lab 06/05/17 0846  INR 1.26    Sepsis Markers Recent Labs  Lab 06/05/17 0816 06/05/17 1038 06/06/17 0115  LATICACIDVEN 2.28* 1.92* 1.6  PROCALCITON  --   --  3.46    ABG No results for input(s): PHART, PCO2ART, PO2ART in the last 168 hours.  Liver Enzymes Recent Labs  Lab 06/05/17 0811  AST 43*  ALT 14  ALKPHOS 76  BILITOT 1.6*  ALBUMIN 2.7*    Cardiac Enzymes Recent Labs  Lab 06/05/17 1324 06/06/17 0115 06/06/17 0658  TROPONINI 0.21* 0.15* 0.11*    Glucose No results for input(s): GLUCAP in the last 168 hours.  Imaging No results found.   STUDIES:  There is scattered patchy densities with no overt proclivity for dependent areas.  CULTURES: None   ANTIBIOTICS: I started azithromycin this morning    DISCUSSION:      This is a 62 year old who is immunosuppressed for rheumatoid arthritis who presented with dyspnea.  She has a history of thoracic aorta stenting for proximal and distal aortic aneurysm.  Echocardiogram shows new LV dysfunction and she gives no history to suggest an acute cardiac event.  ASSESSMENT / PLAN:  PULMONARY A: She has patchy infiltrates and an elevated white count. -- Azithro started by prior intensivist -- No cough or other symptoms to explain wbc is normal today -- Continue azithro for total 5 days but overall looks like more fluid overload dueto poorly controlled blood pressure   CARDIOVASCULAR A: Newly discovered LV dysfunction but without a clinical history of an acute event.   -- Give  large dose lasix -- Monitor Cr I think she is pleatued -- Renal is consulted and spoke over phone today -- Sent UA UDS Urine eosinophills today   RENAL A: Follow renal leadk -- Work up as above -- Avoide nephrotoxic meds  Anemia: ? ACD due to renal faiure  Send stool for occult blood Received PRBC Monitor H and H  Patient was transferred to floor called hospitalist service to pick up on patient awaiting call back. Patient was signed out to team 1 on 4/24 for them to pick up on 4/25 pulmonary will sign off please call us back if we can be of any help  Leeon Makar [facility]  06/08/2017, 2:54 PM

## 2017-06-08 NOTE — Progress Notes (Signed)
hgb 6.6 MD made aware. See orders

## 2017-06-08 NOTE — Consult Note (Addendum)
[facility] Nephrology Consultation Note  Requesting MD: Dr Manuella Ghazi.  Reason for consult: AKI on CKD  HPI:  Diana Walters is a 62 y.o. female with hypertension, thoracic aortic aneurysm repair, COPD, rheumatoid arthritis, CKD stage III with baseline serum creatinine level around 1.8-2s, follows wit Dr. Lorrene Reid at [facility], admitted with shortness of breath and gradually worsening lower extremity edema.  Patient reported that she was not taking her medication for about a year.  Maxide was listed as her home medication however she was not taking.  She was admitted with a diagnosis of heart failure.  On admission her BNP was more than 4500.  Received IV diuretics with worsening serum creatinine level of 4.  Now patient reported feeling much better.  She is on room air.  Denied chest pain, shortness of breath or nausea vomiting.  Lower extremity edema improved.  She was noticed to have decreasing urine output.  Patient denied use of ibuprofen.  No IV contrast.  Had some nausea and diarrhea before admission.  Denies dysuria, urgency frequency.  No headache or dizziness.  Consulted for worsening renal failure with decreasing urine output.  Creat  Date/Time Value Ref Range Status  06/10/2014 08:13 AM 1.10 0.50 - 1.10 mg/dL Final   Creatinine, Ser  Date/Time Value Ref Range Status  06/08/2017 01:50 AM 4.01 (H) 0.44 - 1.00 mg/dL Final  06/07/2017 04:29 AM 4.12 (H) 0.44 - 1.00 mg/dL Final  06/06/2017 01:15 AM 3.89 (H) 0.44 - 1.00 mg/dL Final  06/05/2017 08:52 AM 3.60 (H) 0.44 - 1.00 mg/dL Final  06/05/2017 08:11 AM 3.66 (H) 0.44 - 1.00 mg/dL Final  12/20/2016 11:45 AM 1.78 (H) 0.57 - 1.00 mg/dL Final  09/14/2016 10:26 AM 1.85 (H) 0.44 - 1.00 mg/dL Final  04/16/2016 10:54 AM 1.59 (H) 0.57 - 1.00 mg/dL Final  03/15/2016 10:57 AM 1.57 (H) 0.57 - 1.00 mg/dL Final  11/29/2012 11:00 AM 0.62 0.50 - 1.10 mg/dL Final  05/17/2012 11:14 AM 0.7 0.4 - 1.2 mg/dL Final  03/30/2012 09:19 PM 1.02 0.50 - 1.10  mg/dL Final  11/27/2010 10:58 AM 0.7 0.4 - 1.2 mg/dL Final  09/16/2009 09:35 AM 0.6 0.4 - 1.2 mg/dL Final  05/31/2008 08:35 AM 0.8 0.4 - 1.2 mg/dL Final  04/09/2007 01:20 PM 0.67  Final  04/08/2007 02:32 PM 1.0  Final  12/27/2006 04:19 PM 0.7 0.4 - 1.2 mg/dL Final  10/31/2006 11:00 AM 0.8 0.4 - 1.2 mg/dL Final  09/02/2006 10:06 PM 1.0  Final     PMHx:   Past Medical History:  Diagnosis Date  . ANEMIA 10/17/2009  . ANXIETY 10/28/2006  . Arthritis   . ASTHMA 10/28/2006   no inhalers per pt  . Blood transfusion without reported diagnosis   . Depression   . Duodenitis without mention of hemorrhage 2005  . Heart murmur   . HYPERCHOLESTEROLEMIA 04/01/2008  . HYPERTENSION 10/28/2006  . Menopause    age 30  . Osteoporosis    bil knees  . SEIZURE DISORDER 04/01/2008  . Stroke ([place]) 11/2012  . 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 ESOPHAGOGASTRODUODENOSCOPY  01/29/2004  . 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 LOOP RECORDER IMPLANT N/A 12/01/2012   Procedure: LOOP RECORDER IMPLANT;  Surgeon: Coralyn Mark, MD;  Location: [facility] CATH LAB;  Service: Cardiovascular;  Laterality: N/A;  . TEE WITHOUT CARDIOVERSION N/A 12/01/2012   Procedure: TRANSESOPHAGEAL  ECHOCARDIOGRAM (TEE);  Surgeon: Fay Records, MD;  Location: [facility] ENDOSCOPY;  Service: Cardiovascular;  Laterality: N/A;    Family Hx:  Family History  Problem Relation Age of Onset  . Breast cancer Mother   . Heart disease Mother        before age 80  . Colon cancer Neg Hx   . Esophageal cancer Neg Hx   . Pancreatic cancer Neg Hx   . Rectal cancer Neg Hx   . Stomach cancer Neg Hx     Social History:  reports that she has been smoking cigarettes.  She has a 6.00 pack-year smoking history. She has never used smokeless tobacco. She reports that she drinks alcohol. She reports that she has current or past drug history. Drug:  Marijuana.  Allergies:  Allergies  Allergen Reactions  . Codeine Other (See Comments)    hallucinations  . Ibuprofen     Can't take due to medications    Medications: Prior to Admission medications   Medication Sig Start Date End Date Taking? Authorizing Provider  amLODipine (NORVASC) 10 MG tablet Take 1 tablet (10 mg total) by mouth daily. 03/30/16   Fay Records, MD  aspirin EC 81 MG tablet Take 81 mg by mouth daily.    [provider]  atorvastatin (LIPITOR) 40 MG tablet TAKE 1 TABLET BY MOUTH EVERY DAY 07/19/16   Fay Records, MD  diphenhydrAMINE (BENADRYL) 25 mg capsule Take 25 mg by mouth daily as needed for allergies (TAKES THE DAY OF INFUSION).    [provider]  ferrous sulfate 325 (65 FE) MG tablet Take 1 tablet (325 mg total) daily with breakfast by mouth. 12/29/16   Fay Records, MD  hydrALAZINE (APRESOLINE) 50 MG tablet Take 1.5 tablets (75 mg total) by mouth 3 (three) times daily. 10/22/16   Bhagat, Crista Luria, PA  InFLIXimab (REMICADE IV) Inject into the vein. At rheumatology    [provider]  levETIRAcetam (KEPPRA) 1000 MG tablet Take 1 tablet (1,000 mg total) by mouth 2 (two) times daily. 06/07/16   Dennie Bible, NP  methotrexate 2.5 MG tablet Take 7.5 mg by mouth once a week. Friday    [provider]  metoprolol succinate (TOPROL-XL) 25 MG 24 hr tablet Take 1 tablet (25 mg total) by mouth daily. Take with or immediately following a meal. 09/14/16   Carlisle Cater, PA-C  Multiple Vitamin (MULTIVITAMIN) tablet Take 1 tablet by mouth daily.      [provider]  triamterene-hydrochlorothiazide (MAXZIDE-25) 37.5-25 MG tablet Take 0.5 tablets by mouth daily.    [provider]    I have reviewed the patient's current medications.  Labs:  Results for orders placed or performed during the hospital encounter of 06/05/17 (from the past 48 hour(s))  TSH     Status: None   Collection Time: 06/07/17  4:29 AM   Result Value Ref Range   TSH 0.587 0.350 - 4.500 uIU/mL    Comment: Performed by a 3rd Generation assay with a functional sensitivity of <=0.01 uIU/mL. Performed at [facility], [place] [address], [place], [place] [address]   Basic metabolic panel     Status: Abnormal   Collection Time: 06/07/17  4:29 AM  Result Value Ref Range   Sodium 138 135 - 145 mmol/L   Potassium 3.5 3.5 - 5.1 mmol/L   Chloride 109 101 - 111 mmol/L   CO2 19 (L) 22 - 32 mmol/L   Glucose, Bld 111 (  H) 65 - 99 mg/dL   BUN 53 (H) 6 - 20 mg/dL   Creatinine, Ser 4.12 (H) 0.44 - 1.00 mg/dL   Calcium 7.5 (L) 8.9 - 10.3 mg/dL   GFR calc non Af Amer 11 (L) >60 mL/min   GFR calc Af Amer 12 (L) >60 mL/min    Comment: (NOTE) The eGFR has been calculated using the CKD EPI equation. This calculation has not been validated in all clinical situations. eGFR's persistently <60 mL/min signify possible Chronic Kidney Disease.    Anion gap 10 5 - 15    Comment: Performed at [facility] [address], [place], [place] [address]  Sedimentation rate     Status: Abnormal   Collection Time: 06/08/17  1:50 AM  Result Value Ref Range   Sed Rate 121 (H) 0 - 22 mm/hr    Comment: Performed at [facility] [address] [place], [place] [address]  Basic metabolic panel     Status: Abnormal   Collection Time: 06/08/17  1:50 AM  Result Value Ref Range   Sodium 138 135 - 145 mmol/L   Potassium 3.4 (L) 3.5 - 5.1 mmol/L   Chloride 106 101 - 111 mmol/L   CO2 19 (L) 22 - 32 mmol/L   Glucose, Bld 112 (H) 65 - 99 mg/dL   BUN 55 (H) 6 - 20 mg/dL   Creatinine, Ser 4.01 (H) 0.44 - 1.00 mg/dL   Calcium 7.6 (L) 8.9 - 10.3 mg/dL   GFR calc non Af Amer 11 (L) >60 mL/min   GFR calc Af Amer 13 (L) >60 mL/min    Comment: (NOTE) The eGFR has been calculated using the CKD EPI equation. This calculation has not been validated in all clinical situations. eGFR's persistently <60 mL/min signify possible Chronic Kidney Disease.     Anion gap 13 5 - 15    Comment: Performed at [facility] [address], [place], [place] [address]  CBC with Differential/Platelet     Status: Abnormal   Collection Time: 06/08/17  1:50 AM  Result Value Ref Range   WBC 10.5 4.0 - 10.5 K/uL   RBC 2.98 (L) 3.87 - 5.11 MIL/uL   Hemoglobin 6.6 (LL) 12.0 - 15.0 g/dL    Comment: REPEATED TO VERIFY CRITICAL RESULT CALLED TO, READ BACK BY AND VERIFIED WITH: E.OGUNADE,RN 0259 06/08/17 G.MCADOO    HCT 20.6 (L) 36.0 - 46.0 %   MCV 69.1 (L) 78.0 - 100.0 fL   MCH 22.1 (L) 26.0 - 34.0 pg   MCHC 32.0 30.0 - 36.0 g/dL   RDW 20.0 (H) 11.5 - 15.5 %   Platelets 359 150 - 400 K/uL    Comment: PLATELET COUNT CONFIRMED BY SMEAR   Neutrophils Relative % 85 %   Lymphocytes Relative 12 %   Monocytes Relative 1 %   Eosinophils Relative 2 %   Basophils Relative 0 %   Band Neutrophils 0 %   Metamyelocytes Relative 0 %   Myelocytes 0 %   Promyelocytes Relative 0 %   Blasts 0 %   nRBC 0 0 /100 WBC   Other 0 %   Neutro Abs 8.9 (H) 1.7 - 7.7 K/uL   Lymphs Abs 1.3 0.7 - 4.0 K/uL   Monocytes Absolute 0.1 0.1 - 1.0 K/uL   Eosinophils Absolute 0.2 0.0 - 0.7 K/uL   Basophils Absolute 0.0 0.0 - 0.1 K/uL   RBC Morphology POLYCHROMASIA PRESENT     Comment:  TARGET CELLS Schistocytes present Performed at [facility], [place] [address] [place], [place] [address]   Type and screen [place]     Status: None (Preliminary result)   Collection Time: 06/08/17  5:50 AM  Result Value Ref Range   ABO/RH(D) O POS    Antibody Screen NEG    Sample Expiration 06/11/2017    Unit Number [id number]    Blood Component Type RED CELLS,LR    Unit division 00    Status of Unit ISSUED    Transfusion Status OK TO TRANSFUSE    Crossmatch Result      Compatible Performed at [facility], [place] [address] [place], [place] [address]   Prepare RBC     Status: None   Collection Time: 06/08/17  5:50 AM  Result Value Ref Range   Order  Confirmation      ORDER PROCESSED BY BLOOD BANK Performed at [facility], [place] [address] [place], [place] [address]   ABO/Rh     Status: None   Collection Time: 06/08/17  5:50 AM  Result Value Ref Range   ABO/RH(D)      O POS Performed at [facility] [address], [place], [place] [address]   CBC     Status: Abnormal   Collection Time: 06/08/17  5:52 AM  Result Value Ref Range   WBC 9.5 4.0 - 10.5 K/uL   RBC 2.94 (L) 3.87 - 5.11 MIL/uL   Hemoglobin 6.7 (LL) 12.0 - 15.0 g/dL    Comment: REPEATED TO VERIFY CRITICAL VALUE NOTED.  VALUE IS CONSISTENT WITH PREVIOUSLY REPORTED AND CALLED VALUE.    HCT 20.5 (L) 36.0 - 46.0 %   MCV 69.7 (L) 78.0 - 100.0 fL   MCH 22.8 (L) 26.0 - 34.0 pg   MCHC 32.7 30.0 - 36.0 g/dL   RDW 20.2 (H) 11.5 - 15.5 %   Platelets 361 150 - 400 K/uL    Comment: Performed at [facility] [address] [place], [place] [address]  CBC     Status: Abnormal   Collection Time: 06/08/17 10:41 AM  Result Value Ref Range   WBC 9.1 4.0 - 10.5 K/uL   RBC 3.30 (L) 3.87 - 5.11 MIL/uL   Hemoglobin 7.6 (L) 12.0 - 15.0 g/dL   HCT 23.3 (L) 36.0 - 46.0 %   MCV 70.6 (L) 78.0 - 100.0 fL   MCH 23.0 (L) 26.0 - 34.0 pg   MCHC 32.6 30.0 - 36.0 g/dL   RDW 20.6 (H) 11.5 - 15.5 %   Platelets 408 (H) 150 - 400 K/uL    Comment: Performed at [facility], [place] [address], [place], [place] [address]     ROS:  Pertinent items noted in HPI and remainder of comprehensive ROS otherwise negative.  Physical Exam: Vitals:   06/08/17 1316 06/08/17 1450  BP: (!) 154/66   Pulse:    Resp:    Temp:  97.8 F (36.6 C)  SpO2:  100%     General: Not in distress, lying in bed comfortable HEENT: No JVP noticed Eyes: PERRLA Heart: Regular rate rhythm S1-S2 normal, no rubs Lungs: Clear bilateral, no wheezing Abdomen: Soft, nontender.  Bowel sounds positive Extremities: No edema Skin: No rash or ulcer Neuro: Alert awake, no  asterixis  Assessment/Plan:  #Acute on chronic kidney disease stage III likely hemodynamically mediated in the setting of CHF with poor perfusion to the  kidney, anemia.  Patient is currently euvolemic on exam.  The ultrasound of kidneys done on 08/2016 showed echogenic kidney without hydronephrosis. -I will check UA, bladder scan -Hold further diuretics since patient is euvolemic on exam. Repeat CXR.  Monitor BMP and urine output.  Avoid nephrotoxins.  Watch for renal recovery.  No need for dialysis at this time. -I will check iron studies, phosphorus level and PTH.  #Acute systolic CHF: pt was not taking diuretics at home.  EF of 35 to 40%.  Continue to monitor.  Cardiology following.  #Anemia: Received 1 unit of blood transfusion.  I will check iron studies.  May need to evaluate for GI bleed since patient is reporting blood in the stool.  I will discontinue heparin subcutaneous.  #Hypertension: Blood pressure acceptable.  Currently on Norvasc and hydralazine.  D/w Dr. Palma Holter 06/08/2017, 4:00 PM

## 2017-06-08 NOTE — Progress Notes (Signed)
Progress Note  Patient Name: Diana Walters Date of Encounter: 06/08/2017  Primary Cardiologist: Harrington Challenger  Subjective   PT denies CP  Breathing is OK in bed    Inpatient Medications    Scheduled Meds: . amLODipine  5 mg Oral BID  . atorvastatin  40 mg Oral q1800  . azithromycin  500 mg Oral Daily  . furosemide  80 mg Oral BID  . heparin  5,000 Units Subcutaneous Q8H  . hydrALAZINE  50 mg Oral Q6H  . metoprolol succinate  25 mg Oral BID WC   Continuous Infusions: . sodium chloride    . sodium chloride     PRN Meds: sodium chloride, acetaminophen, hydrALAZINE, LORazepam, ondansetron (ZOFRAN) IV   Vital Signs    Vitals:   06/08/17 0032 06/08/17 0320 06/08/17 0500 06/08/17 0739  BP:  (!) 145/61  (!) 136/59  Pulse: 69 65  (!) 51  Resp: (!) 22 (!) 22  (!) 22  Temp:  98 F (36.7 C)  97.8 F (36.6 C)  TempSrc:  Oral  Oral  SpO2: 100% 99%  100%  Weight:   145 lb 4.5 oz (65.9 kg)   Height:        Intake/Output Summary (Last 24 hours) at 06/08/2017 0834 Last data filed at 06/08/2017 0500 Gross per 24 hour  Intake 820 ml  Output 600 ml  Net 220 ml   I/O are incomplete  [employer]   06/06/17 0442 06/07/17 0500 06/08/17 0500  Weight: 147 lb 4.3 oz (66.8 kg) 144 lb 13.5 oz (65.7 kg) 145 lb 4.5 oz (65.9 kg)    Telemetry     SB to SR   No significant pauses  - Personally Reviewed  ECG      Physical Exam   GEN: No acute distress. Neck: JVP is increased   Cardiac: RRR, no murmurs, rubs, or gallops.  Respiratory: CTA   GI: Soft, nontender, non-distended  Ext  Pneumatic cmpression   Tr edema Neuro:  Nonfocal  Psych: Normal affect   Labs    Chemistry Recent Labs  Lab 06/05/17 0811  06/06/17 0115 06/07/17 0429 06/08/17 0150  NA 142   < > 140 138 138  K 5.2*   < > 4.1 3.5 3.4*  CL 109   < > 110 109 106  CO2 18*  --  16* 19* 19*  GLUCOSE 132*   < > 91 111* 112*  BUN 40*   < > 41* 53* 55*  CREATININE 3.66*   < > 3.89* 4.12* 4.01*  CALCIUM 8.4*  --   7.8* 7.5* 7.6*  PROT 7.6  --   --   --   --   ALBUMIN 2.7*  --   --   --   --   AST 43*  --   --   --   --   ALT 14  --   --   --   --   ALKPHOS 76  --   --   --   --   BILITOT 1.6*  --   --   --   --   GFRNONAA 12*  --  11* 11* 11*  GFRAA 14*  --  13* 12* 13*  ANIONGAP 15  --  '14 10 13   ' < > = values in this interval not displayed.     Hematology Recent Labs  Lab 06/05/17 0846 06/05/17 0852 06/06/17 0115 06/08/17 0150  WBC 15.3*  --  17.0*  10.5  RBC 3.41*  --  3.39* 2.98*  HGB 7.7* 8.5* 7.9* 6.6*  HCT 24.1* 25.0* 23.8* 20.6*  MCV 70.7*  --  70.2* 69.1*  MCH 22.6*  --  23.3* 22.1*  MCHC 32.0  --  33.2 32.0  RDW 20.9*  --  20.8* 20.0*  PLT 411*  --  361 359    Cardiac Enzymes Recent Labs  Lab 06/05/17 1324 06/06/17 0115 06/06/17 0658  TROPONINI 0.21* 0.15* 0.11*    Recent Labs  Lab 06/05/17 0814  TROPIPOC 0.19*     BNP Recent Labs  Lab 06/05/17 0846  BNP >4,500.0*     DDimer No results for input(s): DDIMER in the last 168 hours.   Radiology    No results found.  Cardiac Studies    Patient Profile     62 y.o. female history of HTN, RA, thoracic aneurysm(s/p stent repair 2015 with small contained leake), CVA  Admitted with SOB over past 2 wks    Assessment & Plan    1    Acute systolic CHF   Pt's I/O are incomplete  Wt is down a little (just moved from another floor however, ques accuracy)   She is not uncomfortable at rest   LVEF is down from previous at 35 to 40%    Pt got PO lasix this AM    I would hold further med changes until pt seen by renal  2   Bradycardia  Occurred during night   Pt maintained adeq BP   Hold b blocker this AM and assess  3  Anemia   Hgb dropped to 6.6   Repeat now   1 U prbc ordered   Slow to get to pt   Follow volume  Protonix added to regimn  4   CKD  REnal should/has been contacted  Cr remains over 4    5  HTN  Follow    6 Pulm   CCM is caring for pt   She is on AZT now    7  Hx RA    ESR pending   Was on MTX  prior    8  Hx TAA   Keep on statin        For questions or updates, please contact [facility] Please consult [email or web address] for contact info under Cardiology/STEMI.      Signed, Dorris Carnes, MD  06/08/2017, 8:34 AM

## 2017-06-08 NOTE — Progress Notes (Signed)
No CPAP needed at this time.  Will cont to monitor progress.

## 2017-06-08 NOTE — Progress Notes (Signed)
AM dose of Metoprolol held by RN d/t SB in the 30s overnight; presently HR 65, NSR.  Will discuss w/cardiology.

## 2017-06-08 NOTE — Progress Notes (Signed)
Pt frequently brady to 35's MD paged and made aware. RN will continue to monitor closely

## 2017-06-09 ENCOUNTER — Inpatient Hospital Stay (HOSPITAL_COMMUNITY): Payer: Medicare Other

## 2017-06-09 LAB — CBC WITH DIFFERENTIAL/PLATELET
BASOS ABS: 0 10*3/uL (ref 0.0–0.1)
Basophils Relative: 0 %
EOS ABS: 0.3 10*3/uL (ref 0.0–0.7)
EOS PCT: 3 %
HCT: 22.8 % — ABNORMAL LOW (ref 36.0–46.0)
Hemoglobin: 7.6 g/dL — ABNORMAL LOW (ref 12.0–15.0)
Lymphocytes Relative: 18 %
Lymphs Abs: 1.6 10*3/uL (ref 0.7–4.0)
MCH: 24.3 pg — ABNORMAL LOW (ref 26.0–34.0)
MCHC: 33.3 g/dL (ref 30.0–36.0)
MCV: 72.8 fL — AB (ref 78.0–100.0)
MONO ABS: 0.5 10*3/uL (ref 0.1–1.0)
Monocytes Relative: 6 %
NEUTROS PCT: 73 %
Neutro Abs: 6.3 10*3/uL (ref 1.7–7.7)
PLATELETS: 332 10*3/uL (ref 150–400)
RBC: 3.13 MIL/uL — AB (ref 3.87–5.11)
RDW: 21.7 % — AB (ref 11.5–15.5)
WBC: 8.7 10*3/uL (ref 4.0–10.5)

## 2017-06-09 LAB — RENAL FUNCTION PANEL
ANION GAP: 10 (ref 5–15)
Albumin: 2 g/dL — ABNORMAL LOW (ref 3.5–5.0)
BUN: 56 mg/dL — AB (ref 6–20)
CHLORIDE: 109 mmol/L (ref 101–111)
CO2: 20 mmol/L — AB (ref 22–32)
Calcium: 7.6 mg/dL — ABNORMAL LOW (ref 8.9–10.3)
Creatinine, Ser: 3.85 mg/dL — ABNORMAL HIGH (ref 0.44–1.00)
GFR calc Af Amer: 13 mL/min — ABNORMAL LOW (ref >60)
GFR calc non Af Amer: 11 mL/min — ABNORMAL LOW (ref >60)
Glucose, Bld: 103 mg/dL — ABNORMAL HIGH (ref 65–99)
POTASSIUM: 3.5 mmol/L (ref 3.5–5.1)
Phosphorus: 4.5 mg/dL (ref 2.5–4.6)
Sodium: 139 mmol/L (ref 135–145)

## 2017-06-09 LAB — BPAM RBC
BLOOD PRODUCT EXPIRATION DATE: 201905012359
ISSUE DATE / TIME: 201904241057
Unit Type and Rh: 5100

## 2017-06-09 LAB — TYPE AND SCREEN
ABO/RH(D): O POS
ANTIBODY SCREEN: NEGATIVE
Unit division: 0

## 2017-06-09 LAB — IRON AND TIBC
IRON: 17 ug/dL — AB (ref 28–170)
SATURATION RATIOS: 10 % — AB (ref 10.4–31.8)
TIBC: 165 ug/dL — AB (ref 250–450)
UIBC: 148 ug/dL

## 2017-06-09 LAB — FERRITIN: Ferritin: 299 ng/mL (ref 11–307)

## 2017-06-09 MED ORDER — ISOSORBIDE MONONITRATE ER 60 MG PO TB24
60.0000 mg | ORAL_TABLET | Freq: Every day | ORAL | Status: DC
  Administered 2017-06-09 – 2017-06-13 (×5): 60 mg via ORAL
  Filled 2017-06-09 (×5): qty 1

## 2017-06-09 MED ORDER — SODIUM CHLORIDE 0.9 % IV SOLN
510.0000 mg | Freq: Once | INTRAVENOUS | Status: AC
  Administered 2017-06-09: 510 mg via INTRAVENOUS
  Filled 2017-06-09: qty 17

## 2017-06-09 MED ORDER — HYDRALAZINE HCL 50 MG PO TABS
75.0000 mg | ORAL_TABLET | Freq: Three times a day (TID) | ORAL | Status: DC
  Administered 2017-06-09 – 2017-06-13 (×11): 75 mg via ORAL
  Filled 2017-06-09 (×12): qty 2

## 2017-06-09 MED ORDER — FUROSEMIDE 40 MG PO TABS
40.0000 mg | ORAL_TABLET | Freq: Two times a day (BID) | ORAL | Status: DC
  Administered 2017-06-09 – 2017-06-10 (×3): 40 mg via ORAL
  Filled 2017-06-09 (×3): qty 1

## 2017-06-09 MED ORDER — METOPROLOL SUCCINATE ER 25 MG PO TB24
25.0000 mg | ORAL_TABLET | Freq: Every day | ORAL | Status: DC
  Administered 2017-06-09 – 2017-06-10 (×2): 25 mg via ORAL
  Filled 2017-06-09 (×2): qty 1

## 2017-06-09 NOTE — Progress Notes (Signed)
[facility] NEPHROLOGY PROGRESS NOTE  Assessment/ Plan: Pt is a 62 y.o. yo female  with hypertension, thoracic aortic aneurysm repair, COPD, rheumatoid arthritis, CKD stage III with baseline serum creatinine level around 1.8-2s about a year ago, follows with Dr. Lorrene Reid at [facility], admitted with shortness of breath and gradually worsening lower extremity edema.  She was not taking medication at home. Admitted with CHF (BNP >4500), acute pulm edema and worsening renal failure.  Assessment/Plan:  #Acute on chronic kidney disease stage III likely hemodynamically mediated in the setting of CHF with poor perfusion to the kidney, anemia.  The ultrasound of kidneys done on 08/2016 showed echogenic kidney without hydronephrosis. -Serum creatinine level trending down to 3.8 from 4.  Urine output of 1000 cc.  I will start oral Lasix 40 twice a day.  Monitor BMP, urine output and intake labs.  Follow-up pending chest x-ray. - Avoid nephrotoxins.  Watch for renal recovery.  No need for dialysis at this time. -Phosphorus level 4.5.  Follow-up PTH level.  #Anemia multifactorial including chronic kidney disease and may have GI bleed: Received blood transfusion.  Iron is studies with saturation of 10% with serum iron of 17.  Ordered a dose of IV Feraheme today.  Monitor CBC.  #Acute systolic CHF: pt was not taking diuretics at home.  EF of 35 to 40%.  Continue to monitor.  Cardiology following.  On Lipitor, Imdur and hydralazine.  #Hypertension: Blood pressure acceptable.    Continue to monitor.  Continue current medication.  Discussed with the patient's daughter at bedside.  Subjective: Seen and examined bedside.  No chest pain, shortness of breath, nausea vomiting. Objective Vital signs in last 24 hours: Vitals:   06/09/17 0122 06/09/17 0330 06/09/17 0500 06/09/17 0637  BP: (!) 121/96 (!) 156/64  133/75  Pulse:  73    Resp:  16    Temp:  98.3 F (36.8 C)    TempSrc:  Oral    SpO2:  97%     Weight:   67.9 kg (149 lb 9.6 oz)   Height:       Weight change: 1.958 kg (4 lb 5.1 oz)  Intake/Output Summary (Last 24 hours) at 06/09/2017 0825 Last data filed at 06/09/2017 0100 Gross per 24 hour  Intake 335 ml  Output 1050 ml  Net -715 ml       Labs: Basic Metabolic Panel: Recent Labs  Lab 06/05/17 0811  06/06/17 0115 06/07/17 0429 06/08/17 0150 06/09/17 0230  NA 142   < > 140 138 138 139  K 5.2*   < > 4.1 3.5 3.4* 3.5  CL 109   < > 110 109 106 109  CO2 18*  --  16* 19* 19* 20*  GLUCOSE 132*   < > 91 111* 112* 103*  BUN 40*   < > 41* 53* 55* 56*  CREATININE 3.66*   < > 3.89* 4.12* 4.01* 3.85*  CALCIUM 8.4*  --  7.8* 7.5* 7.6* 7.6*  PHOS 5.4*  --  5.0*  --   --  4.5   < > = values in this interval not displayed.   Liver Function Tests: Recent Labs  Lab 06/05/17 0811 06/09/17 0230  AST 43*  --   ALT 14  --   ALKPHOS 76  --   BILITOT 1.6*  --   PROT 7.6  --   ALBUMIN 2.7* 2.0*   No results for input(s): LIPASE, AMYLASE in the last 168 hours. No results for  input(s): AMMONIA in the last 168 hours. CBC: Recent Labs  Lab 06/05/17 0846  06/06/17 0115 06/08/17 0150 06/08/17 0552 06/08/17 1041 06/09/17 0230  WBC 15.3*  --  17.0* 10.5 9.5 9.1 8.7  NEUTROABS 13.5*  --   --  8.9*  --   --  6.3  HGB 7.7*   < > 7.9* 6.6* 6.7* 7.6* 7.6*  HCT 24.1*   < > 23.8* 20.6* 20.5* 23.3* 22.8*  MCV 70.7*  --  70.2* 69.1* 69.7* 70.6* 72.8*  PLT 411*  --  361 359 361 408* 332   < > = values in this interval not displayed.   Cardiac Enzymes: Recent Labs  Lab 06/05/17 1324 06/06/17 0115 06/06/17 0658  TROPONINI 0.21* 0.15* 0.11*   CBG: No results for input(s): GLUCAP in the last 168 hours.  Iron Studies:  Recent Labs    06/09/17 0230  IRON 17*  TIBC 165*  FERRITIN 299   Studies/Results: No results found.  Medications: Infusions: . sodium chloride    . sodium chloride    . ferumoxytol      Scheduled Medications: . amLODipine  5 mg Oral BID  .  atorvastatin  40 mg Oral q1800  . azithromycin  500 mg Oral Daily  . furosemide  40 mg Oral BID  . hydrALAZINE  75 mg Oral Q8H  . isosorbide mononitrate  60 mg Oral Daily  . pantoprazole  40 mg Oral Q1200    have reviewed scheduled and prn medications.  Physical Exam: General:NAD, comfortable Heart:RRR, s1s2 nl, no rubs Lungs:clear b/l, no crackle Abdomen:soft, Non-tender, non-distended Extremities: Trace lower extremities edema.   Dron Prasad Bhandari 06/09/2017,8:25 AM  LOS: 4 days

## 2017-06-09 NOTE — Progress Notes (Addendum)
Progress Note  Patient Name: MADALENA KESECKER Date of Encounter: 06/09/2017  Primary Cardiologist: Harrington Challenger  Subjective   Breathing is OK   No CP   Inpatient Medications    Scheduled Meds: . amLODipine  5 mg Oral BID  . atorvastatin  40 mg Oral q1800  . azithromycin  500 mg Oral Daily  . hydrALAZINE  50 mg Oral Q6H  . pantoprazole  40 mg Oral Q1200   Continuous Infusions: . sodium chloride    . sodium chloride    . ferumoxytol     PRN Meds: sodium chloride, acetaminophen, hydrALAZINE, LORazepam, ondansetron (ZOFRAN) IV   Vital Signs    Vitals:   06/09/17 0122 06/09/17 0330 06/09/17 0500 06/09/17 0637  BP: (!) 121/96 (!) 156/64  133/75  Pulse:  73    Resp:  16    Temp:  98.3 F (36.8 C)    TempSrc:  Oral    SpO2:  97%    Weight:   149 lb 9.6 oz (67.9 kg)   Height:        Intake/Output Summary (Last 24 hours) at 06/09/2017 0706 Last data filed at 06/09/2017 0100 Gross per 24 hour  Intake 335 ml  Output 1050 ml  Net -715 ml   I/O are incomplete  [employer]   06/07/17 0500 06/08/17 0500 06/09/17 0500  Weight: 144 lb 13.5 oz (65.7 kg) 145 lb 4.5 oz (65.9 kg) 149 lb 9.6 oz (67.9 kg)    Telemetry     SR   Personally Reviewed  ECG      Physical Exam   GEN: No acute distress. Neck: JVP is nl  Cardiac: RRR, no murmurs, rubs, or gallops.  Respiratory: CTA   GI: Soft, nontender, non-distended  Ext  Pneumatic cmpression   Tr edema Neuro:  Nonfocal  Psych: Normal affect   Labs    Chemistry Recent Labs  Lab 06/05/17 0811  06/07/17 0429 06/08/17 0150 06/09/17 0230  NA 142   < > 138 138 139  K 5.2*   < > 3.5 3.4* 3.5  CL 109   < > 109 106 109  CO2 18*   < > 19* 19* 20*  GLUCOSE 132*   < > 111* 112* 103*  BUN 40*   < > 53* 55* 56*  CREATININE 3.66*   < > 4.12* 4.01* 3.85*  CALCIUM 8.4*   < > 7.5* 7.6* 7.6*  PROT 7.6  --   --   --   --   ALBUMIN 2.7*  --   --   --  2.0*  AST 43*  --   --   --   --   ALT 14  --   --   --   --   ALKPHOS 76  --    --   --   --   BILITOT 1.6*  --   --   --   --   GFRNONAA 12*   < > 11* 11* 11*  GFRAA 14*   < > 12* 13* 13*  ANIONGAP 15   < > 10 13 10    < > = values in this interval not displayed.     Hematology Recent Labs  Lab 06/08/17 0552 06/08/17 1041 06/09/17 0230  WBC 9.5 9.1 8.7  RBC 2.94* 3.30* 3.13*  HGB 6.7* 7.6* 7.6*  HCT 20.5* 23.3* 22.8*  MCV 69.7* 70.6* 72.8*  MCH 22.8* 23.0* 24.3*  MCHC 32.7 32.6 33.3  RDW  20.2* 20.6* 21.7*  PLT 361 408* 332    Cardiac Enzymes Recent Labs  Lab 06/05/17 1324 06/06/17 0115 06/06/17 0658  TROPONINI 0.21* 0.15* 0.11*    Recent Labs  Lab 06/05/17 0814  TROPIPOC 0.19*     BNP Recent Labs  Lab 06/05/17 0846  BNP >4,500.0*     DDimer No results for input(s): DDIMER in the last 168 hours.   Radiology    No results found.  Cardiac Studies    Patient Profile     62 y.o. female history of HTN, RA, thoracic aneurysm(s/p stent repair 2015 with small contained leake), CVA  Admitted with SOB over past 2 wks  Pt does admit to not takine any meds for 4 months pror to admit  Assessment & Plan    1    Acute systolic CHF   Volume status is good   On hydralazine  And nitrate  Add Toprol XL low dose and follow HR    2   Bradycardia  No further spells   WIll add low dose Toprol XL for CHF  and follow      3  Anemia   Hg 7.6 today   Will need to be followed closely  FE defic   Should have GI eval  4   CKD  Cr improved   Renal to help with lasix dosing     5  HTN  Follow  With med changes    6 Pulm  Lungs are CTA   7  Hx RA    ESR pending   Was on MTX prior    8  Hx TAA   Keep on statin        For questions or updates, please contact [facility] Please consult [email or web address] for contact info under Cardiology/STEMI.      Signed, Dorris Carnes, MD  06/09/2017, 7:06 AM

## 2017-06-10 DIAGNOSIS — N183 Chronic kidney disease, stage 3 (moderate): Secondary | ICD-10-CM

## 2017-06-10 DIAGNOSIS — R001 Bradycardia, unspecified: Secondary | ICD-10-CM | POA: Diagnosis present

## 2017-06-10 DIAGNOSIS — D638 Anemia in other chronic diseases classified elsewhere: Secondary | ICD-10-CM | POA: Diagnosis present

## 2017-06-10 DIAGNOSIS — N179 Acute kidney failure, unspecified: Secondary | ICD-10-CM

## 2017-06-10 DIAGNOSIS — I5021 Acute systolic (congestive) heart failure: Secondary | ICD-10-CM | POA: Diagnosis present

## 2017-06-10 DIAGNOSIS — R778 Other specified abnormalities of plasma proteins: Secondary | ICD-10-CM | POA: Diagnosis present

## 2017-06-10 DIAGNOSIS — R748 Abnormal levels of other serum enzymes: Secondary | ICD-10-CM

## 2017-06-10 DIAGNOSIS — I119 Hypertensive heart disease without heart failure: Secondary | ICD-10-CM

## 2017-06-10 DIAGNOSIS — R7989 Other specified abnormal findings of blood chemistry: Secondary | ICD-10-CM | POA: Diagnosis present

## 2017-06-10 DIAGNOSIS — R011 Cardiac murmur, unspecified: Secondary | ICD-10-CM

## 2017-06-10 DIAGNOSIS — N184 Chronic kidney disease, stage 4 (severe): Secondary | ICD-10-CM | POA: Diagnosis present

## 2017-06-10 DIAGNOSIS — I509 Heart failure, unspecified: Secondary | ICD-10-CM

## 2017-06-10 LAB — RENAL FUNCTION PANEL
ALBUMIN: 2.1 g/dL — AB (ref 3.5–5.0)
ANION GAP: 12 (ref 5–15)
BUN: 59 mg/dL — ABNORMAL HIGH (ref 6–20)
CHLORIDE: 108 mmol/L (ref 101–111)
CO2: 19 mmol/L — AB (ref 22–32)
Calcium: 7.6 mg/dL — ABNORMAL LOW (ref 8.9–10.3)
Creatinine, Ser: 3.97 mg/dL — ABNORMAL HIGH (ref 0.44–1.00)
GFR calc Af Amer: 13 mL/min — ABNORMAL LOW (ref >60)
GFR calc non Af Amer: 11 mL/min — ABNORMAL LOW (ref >60)
Glucose, Bld: 98 mg/dL (ref 65–99)
PHOSPHORUS: 4.9 mg/dL — AB (ref 2.5–4.6)
POTASSIUM: 3.8 mmol/L (ref 3.5–5.1)
Sodium: 139 mmol/L (ref 135–145)

## 2017-06-10 LAB — PTH, INTACT AND CALCIUM
Calcium, Total (PTH): 7.6 mg/dL — ABNORMAL LOW (ref 8.7–10.3)
PTH: 180 pg/mL — AB (ref 15–65)

## 2017-06-10 MED ORDER — DARBEPOETIN ALFA 40 MCG/0.4ML IJ SOSY
40.0000 ug | PREFILLED_SYRINGE | Freq: Once | INTRAMUSCULAR | Status: AC
  Administered 2017-06-10: 40 ug via SUBCUTANEOUS
  Filled 2017-06-10: qty 0.4

## 2017-06-10 MED ORDER — FUROSEMIDE 40 MG PO TABS
40.0000 mg | ORAL_TABLET | Freq: Every day | ORAL | Status: DC
  Administered 2017-06-11 – 2017-06-13 (×3): 40 mg via ORAL
  Filled 2017-06-10 (×3): qty 1

## 2017-06-10 MED ORDER — METOPROLOL TARTRATE 12.5 MG HALF TABLET
12.5000 mg | ORAL_TABLET | Freq: Two times a day (BID) | ORAL | Status: AC
  Administered 2017-06-11 – 2017-06-12 (×4): 12.5 mg via ORAL
  Filled 2017-06-10 (×5): qty 1

## 2017-06-10 NOTE — Progress Notes (Signed)
[facility] NEPHROLOGY PROGRESS NOTE  Assessment/ Plan: Pt is a 62 y.o. yo female  with hypertension, thoracic aortic aneurysm repair, COPD, rheumatoid arthritis, CKD stage III with baseline serum creatinine level around 1.8-2s about a year ago, follows with Dr. Lorrene Reid at [facility], admitted with shortness of breath and gradually worsening lower extremity edema.  She was not taking medication at home. Admitted with CHF (BNP >4500), acute pulm edema and worsening renal failure.  Assessment/Plan:  #Acute on chronic kidney disease stage III likely hemodynamically mediated in the setting of CHF with poor perfusion to the kidney, anemia vs progressive CKD.  The ultrasound of kidneys done on 08/2016 showed echogenic kidney without hydronephrosis. -Serum creatinine level mildly elevated to 3.9 today.  I will lower the dose of Lasix to once daily.  She is in room air.  Her urine output was 1425 cc in 24 hours.  Monitor BMP, urine output.  No need for dialysis today.  She needs close follow-up with nephrologist after discharge from the hospital. -PTH 180.  #Anemia multifactorial including chronic kidney disease and may have GI bleed: Received blood transfusion.  Iron is studies with saturation of 10% with serum iron of 17.  Received a dose of IV Feraheme today.  I will order a dose of Aranesp today.  Monitor CBC.  #Acute systolic CHF: pt was not taking diuretics at home.  EF of 35 to 40%.  Continue to monitor.  Cardiology following.  On Lipitor, Imdur and hydralazine.  #Hypertension: Blood pressure acceptable.    Continue to monitor.  Continue current medication.  Discussed with the patient's daughter at bedside.  Subjective: Seen and examined bedside.  No chest pain, shortness of breath, nausea or vomiting.  Objective Vital signs in last 24 hours: Vitals:   06/09/17 2314 06/10/17 0349 06/10/17 0604 06/10/17 0831  BP: 130/61 134/61  (!) 141/58  Pulse: 69 71  70  Resp: 12 15  16   Temp:  97.7 F (36.5 C) 97.8 F (36.6 C)  97.6 F (36.4 C)  TempSrc: Oral Oral  Oral  SpO2: 97% 98%  100%  Weight:   68.3 kg (150 lb 8 oz)   Height:       Weight change: 0.408 kg (14.4 oz)  Intake/Output Summary (Last 24 hours) at 06/10/2017 0840 Last data filed at 06/10/2017 0824 Gross per 24 hour  Intake 240 ml  Output 1425 ml  Net -1185 ml       Labs: Basic Metabolic Panel: Recent Labs  Lab 06/06/17 0115  06/08/17 0150 06/09/17 0230 06/10/17 0244  NA 140   < > 138 139 139  K 4.1   < > 3.4* 3.5 3.8  CL 110   < > 106 109 108  CO2 16*   < > 19* 20* 19*  GLUCOSE 91   < > 112* 103* 98  BUN 41*   < > 55* 56* 59*  CREATININE 3.89*   < > 4.01* 3.85* 3.97*  CALCIUM 7.8*   < > 7.6* 7.6*  7.6* 7.6*  PHOS 5.0*  --   --  4.5 4.9*   < > = values in this interval not displayed.   Liver Function Tests: Recent Labs  Lab 06/05/17 0811 06/09/17 0230 06/10/17 0244  AST 43*  --   --   ALT 14  --   --   ALKPHOS 76  --   --   BILITOT 1.6*  --   --   PROT 7.6  --   --  ALBUMIN 2.7* 2.0* 2.1*   No results for input(s): LIPASE, AMYLASE in the last 168 hours. No results for input(s): AMMONIA in the last 168 hours. CBC: Recent Labs  Lab 06/05/17 0846  06/06/17 0115 06/08/17 0150 06/08/17 0552 06/08/17 1041 06/09/17 0230  WBC 15.3*  --  17.0* 10.5 9.5 9.1 8.7  NEUTROABS 13.5*  --   --  8.9*  --   --  6.3  HGB 7.7*   < > 7.9* 6.6* 6.7* 7.6* 7.6*  HCT 24.1*   < > 23.8* 20.6* 20.5* 23.3* 22.8*  MCV 70.7*  --  70.2* 69.1* 69.7* 70.6* 72.8*  PLT 411*  --  361 359 361 408* 332   < > = values in this interval not displayed.   Cardiac Enzymes: Recent Labs  Lab 06/05/17 1324 06/06/17 0115 06/06/17 0658  TROPONINI 0.21* 0.15* 0.11*   CBG: No results for input(s): GLUCAP in the last 168 hours.  Iron Studies:  Recent Labs    06/09/17 0230  IRON 17*  TIBC 165*  FERRITIN 299   Studies/Results: Dg Chest 1 View  Result Date: 06/09/2017 CLINICAL DATA:  Shortness of  breath. EXAM: CHEST  1 VIEW COMPARISON:  Chest x-ray 06/06/2017, 06/05/2017.  CT 06/05/2017. FINDINGS: Prior CABG. Aortic stent graft in stable position. Cardiac monitoring device again noted. Cardiomegaly with diffuse bilateral pulmonary infiltrates/edema again noted. Similar findings on prior exam. No pleural effusion or pneumothorax. IMPRESSION: 1. Prior CABG. Aortic stent graft in stable position. Cardiac monitor device again noted. 2. Cardiomegaly with diffuse bilateral pulmonary infiltrates/edema again noted. Similar findings on prior exam. Electronically Signed   By: [place]   On: 06/09/2017 09:56    Medications: Infusions: . sodium chloride    . sodium chloride      Scheduled Medications: . amLODipine  5 mg Oral BID  . atorvastatin  40 mg Oral q1800  . azithromycin  500 mg Oral Daily  . [START ON 06/11/2017] furosemide  40 mg Oral Daily  . hydrALAZINE  75 mg Oral Q8H  . isosorbide mononitrate  60 mg Oral Daily  . metoprolol succinate  25 mg Oral Daily  . pantoprazole  40 mg Oral Q1200    have reviewed scheduled and prn medications.  Physical Exam: General:NAD, comfortable Heart: Regular rate rhythm S1-S2 normal.  No rubs Lungs: Clear bilateral, no crackle Abdomen:soft, Non-tender, non-distended Extremities: Trace nonpitting lower extremities edema.   Ailanie Ruttan Prasad Ailynn Gow 06/10/2017,8:40 AM  LOS: 5 days

## 2017-06-10 NOTE — Progress Notes (Addendum)
Progress Note    Diana Walters  ZOX:[id number] DOB: 1955/06/15  DOA: 06/05/2017 PCP: Renato Shin, MD    Brief Narrative:   Chief complaint: Respiratory distress  Medical records reviewed and are as summarized below:  Diana Walters is an 62 y.o. female with a PMH of hypertension, thoracic aortic aneurysm status post stent graft repair at [facility] in 2015 with small contained leak, history of CVA who was admitted by the critical care team on 06/05/17 for treatment of respiratory distress/acute respiratory failure, acute renal failure. Cardiology was also consulted for elevated troponin on admission.  Summary of events by date: 06/05/17: Admitted by critical care team to the ICU. Dopplers negative for DVT. Placed on BiPAP. NG tube placed. Bradycardic into the 30s. Troponins elevated. Cardiology consulted and advised conservative care for now. Hyperkalemic. KUB ordered to evaluate abdominal pain: Negative for acute process. Lasix given for respiratory distress. 06/06/17: Weaned to nasal cannula. Placed on azithromycin for community-acquired pneumonia. Respiratory virus panel negative. Lasix increased by cardiology for acute systolic CHF. EF 35-40 percent on Echo, which was a change from her prior echo. Transferred to the SDU. 06/07/17: Nephrology consultation requested for creatinine elevation to 4.12. 06/08/17: Hemoglobin 6.6. Received PRBCs. Remains bradycardic at times with heart rate into the 30s. Beta blocker held. 06/09/17: Started on low-dose Toprol by cardiology. Hemoglobin 7.6. 06/10/17: TRH assumes care of patient.  Assessment/Plan:   Principal problem: Acute respiratory failure (HCC) Resolved.  Patient is breathing comfortably.  Thought to be from acute systolic CHF however is on azithromycin with a recommended treatment course of 5 days per pulmonology.  Active problems:  Acute on chronic stage III kidney disease with baseline creatinine 1.8-2.0 Was hemodynamically mediated.   Renal ultrasound done back in July 2018 showed echogenic kidneys.  Serum creatinine remains elevated at 3.9.  Nephrology following and adjusting diuretic therapy.  Will need outpatient nephrology follow-up.  Anemia of chronic disease/possible acute blood loss anemia contributory Received 1 unit of PRBCs for hemoglobin of 6.6.  Given IV iron and Aranesp as well.  Will need GI evaluation once stable.  If hemoglobin stable, this can likely be done as an outpatient.  Hemoglobin currently 7.6 mg/dL.  Fecal occult blood testing ordered.  Acute systolic CHF with elevated troponin EF 35-40% by echocardiography, which is new.  Cardiology following.  Continue current therapy including Lipitor, Imdur, and hydralazine.  Volume status appears to be stable.  Cardiology has added low-dose metoprolol XL however she has recurrent bradycardia and has not tolerated this.  Will change to metoprolol 12.5 mg twice daily and hold for heart rate less than 60.  Hypertension Blood pressure stable on current therapy.  Rheumatoid arthritis Has been treated with methotrexate in the past.  Bradycardia Hold beta-blocker with parameters.  Body mass index is 23.57 kg/m.   Family Communication/Anticipated D/C date and plan/Code Status   DVT prophylaxis: SCDs ordered. Code Status: Full Code.  Family Communication: Family member asleep at the bedside. Disposition Plan: Home when hemoglobin and creatinine stabilizes.   Medical Consultants:    Nephrology  Pulmonology  Cardiology   Anti-Infectives:    Azithromycin 06/06/2017---> 06/10/2017  Subjective:   Patient denies any shortness of breath, chest pain, or nausea/vomiting.  Objective:    Vitals:   06/09/17 2314 06/10/17 0349 06/10/17 0604 06/10/17 0831  BP: 130/61 134/61  (!) 141/58  Pulse: 69 71  70  Resp: 12 15  16   Temp: 97.7 F (36.5 C) 97.8 F (36.6  C)  97.6 F (36.4 C)  TempSrc: Oral Oral  Oral  SpO2: 97% 98%  100%  Weight:   68.3 kg  (150 lb 8 oz)   Height:        Intake/Output Summary (Last 24 hours) at 06/10/2017 0907 Last data filed at 06/10/2017 0824 Gross per 24 hour  Intake 240 ml  Output 1425 ml  Net -1185 ml   Filed Weights   06/08/17 0500 06/09/17 0500 06/10/17 0604  Weight: 65.9 kg (145 lb 4.5 oz) 67.9 kg (149 lb 9.6 oz) 68.3 kg (150 lb 8 oz)    Exam: General: No acute distress. Cardiovascular: Heart sounds show a regular rate, and rhythm. No gallops or rubs.III/VI murmur. No JVD. Lungs: Clear to auscultation bilaterally with good air movement. No rales, rhonchi or wheezes. Abdomen: Soft, nontender, nondistended with normal active bowel sounds. No masses. No hepatosplenomegaly. Skin: Warm and dry. No rashes or lesions. Extremities: No clubbing or cyanosis. Trace edema. Pedal pulses 2+.  Data Reviewed:   I have personally reviewed following labs and imaging studies:  Labs: Labs show the following:   Basic Metabolic Panel: Recent Labs  Lab 06/05/17 0811  06/06/17 0115 06/07/17 0429 06/08/17 0150 06/09/17 0230 06/10/17 0244  NA 142   < > 140 138 138 139 139  K 5.2*   < > 4.1 3.5 3.4* 3.5 3.8  CL 109   < > 110 109 106 109 108  CO2 18*  --  16* 19* 19* 20* 19*  GLUCOSE 132*   < > 91 111* 112* 103* 98  BUN 40*   < > 41* 53* 55* 56* 59*  CREATININE 3.66*   < > 3.89* 4.12* 4.01* 3.85* 3.97*  CALCIUM 8.4*  --  7.8* 7.5* 7.6* 7.6*  7.6* 7.6*  MG 2.2  --  2.1  --   --   --   --   PHOS 5.4*  --  5.0*  --   --  4.5 4.9*   < > = values in this interval not displayed.   GFR Estimated Creatinine Clearance: 14.1 mL/min (A) (by C-G formula based on SCr of 3.97 mg/dL (H)). Liver Function Tests: Recent Labs  Lab 06/05/17 0811 06/09/17 0230 06/10/17 0244  AST 43*  --   --   ALT 14  --   --   ALKPHOS 76  --   --   BILITOT 1.6*  --   --   PROT 7.6  --   --   ALBUMIN 2.7* 2.0* 2.1*   Coagulation profile Recent Labs  Lab 06/05/17 0846  INR 1.26    CBC: Recent Labs  Lab 06/05/17 0846   06/06/17 0115 06/08/17 0150 06/08/17 0552 06/08/17 1041 06/09/17 0230  WBC 15.3*  --  17.0* 10.5 9.5 9.1 8.7  NEUTROABS 13.5*  --   --  8.9*  --   --  6.3  HGB 7.7*   < > 7.9* 6.6* 6.7* 7.6* 7.6*  HCT 24.1*   < > 23.8* 20.6* 20.5* 23.3* 22.8*  MCV 70.7*  --  70.2* 69.1* 69.7* 70.6* 72.8*  PLT 411*  --  361 359 361 408* 332   < > = values in this interval not displayed.   Cardiac Enzymes: Recent Labs  Lab 06/05/17 1324 06/06/17 0115 06/06/17 0658  TROPONINI 0.21* 0.15* 0.11*   Anemia work up: Recent Labs    06/09/17 0230  FERRITIN 299  TIBC 165*  IRON 17*   Sepsis Labs: Recent  Labs  Lab 06/05/17 0816  06/05/17 1038 06/06/17 0115 06/08/17 0150 06/08/17 0552 06/08/17 1041 06/09/17 0230  PROCALCITON  --   --   --  3.46  --   --   --   --   WBC  --    < >  --  17.0* 10.5 9.5 9.1 8.7  LATICACIDVEN 2.28*  --  1.92* 1.6  --   --   --   --    < > = values in this interval not displayed.    Microbiology Recent Results (from the past 240 hour(s))  MRSA PCR Screening     Status: None   Collection Time: 06/05/17  6:58 PM  Result Value Ref Range Status   MRSA by PCR NEGATIVE NEGATIVE Final    Comment:        The GeneXpert MRSA Assay (FDA approved for NASAL specimens only), is one component of a comprehensive MRSA colonization surveillance program. It is not intended to diagnose MRSA infection nor to guide or monitor treatment for MRSA infections. Performed at [facility], [place] [address], [place], [place] [address]   Respiratory Panel by PCR     Status: None   Collection Time: 06/06/17 12:41 PM  Result Value Ref Range Status   Adenovirus NOT DETECTED NOT DETECTED Final   Coronavirus 229E NOT DETECTED NOT DETECTED Final   Coronavirus HKU1 NOT DETECTED NOT DETECTED Final   Coronavirus NL63 NOT DETECTED NOT DETECTED Final   Coronavirus OC43 NOT DETECTED NOT DETECTED Final   Metapneumovirus NOT DETECTED NOT DETECTED Final   Rhinovirus / Enterovirus NOT  DETECTED NOT DETECTED Final   Influenza A NOT DETECTED NOT DETECTED Final   Influenza B NOT DETECTED NOT DETECTED Final   Parainfluenza Virus 1 NOT DETECTED NOT DETECTED Final   Parainfluenza Virus 2 NOT DETECTED NOT DETECTED Final   Parainfluenza Virus 3 NOT DETECTED NOT DETECTED Final   Parainfluenza Virus 4 NOT DETECTED NOT DETECTED Final   Respiratory Syncytial Virus NOT DETECTED NOT DETECTED Final   Bordetella pertussis NOT DETECTED NOT DETECTED Final   Chlamydophila pneumoniae NOT DETECTED NOT DETECTED Final   Mycoplasma pneumoniae NOT DETECTED NOT DETECTED Final    Comment: Performed at [facility], [place] [address], [place], [place] [address]    Procedures and diagnostic studies:  Dg Chest 1 View  Result Date: 06/09/2017 CLINICAL DATA:  Shortness of breath. EXAM: CHEST  1 VIEW COMPARISON:  Chest x-ray 06/06/2017, 06/05/2017.  CT 06/05/2017. FINDINGS: Prior CABG. Aortic stent graft in stable position. Cardiac monitoring device again noted. Cardiomegaly with diffuse bilateral pulmonary infiltrates/edema again noted. Similar findings on prior exam. No pleural effusion or pneumothorax. IMPRESSION: 1. Prior CABG. Aortic stent graft in stable position. Cardiac monitor device again noted. 2. Cardiomegaly with diffuse bilateral pulmonary infiltrates/edema again noted. Similar findings on prior exam. Electronically Signed   By: [place]   On: 06/09/2017 09:56    Medications:   . amLODipine  5 mg Oral BID  . atorvastatin  40 mg Oral q1800  . azithromycin  500 mg Oral Daily  . darbepoetin (ARANESP) injection - NON-DIALYSIS  40 mcg Subcutaneous Once  . [START ON 06/11/2017] furosemide  40 mg Oral Daily  . hydrALAZINE  75 mg Oral Q8H  . isosorbide mononitrate  60 mg Oral Daily  . metoprolol succinate  25 mg Oral Daily  . pantoprazole  40 mg Oral Q1200   Continuous Infusions: . sodium chloride    .  sodium chloride       LOS: 5 days   Jacquelynn Cree  [facility] Pager [phone]. If unable to reach me by pager, please call my cell phone at [phone].  *Please refer to [email or web address], password [email or web address] to get updated schedule on who will round on this patient, as hospitalists switch teams weekly. If 7PM-7AM, please contact night-coverage at [email or web address], password [email or web address] for any overnight needs.  06/10/2017, 9:07 AM

## 2017-06-10 NOTE — Care Management Note (Signed)
Case Management Note  Patient Details  Name: Diana Walters MRN: [id number] Date of Birth: 12/04/1955  Subjective/Objective:   Pt admitted with SOB                Action/Plan:  Pt is independent from home with daughter.  Pt has PCP and denies barriers to obtaining/paying for medications   Expected Discharge Date:                  Expected Discharge Plan:  Home/Self Care(From home with daughter)  In-House Referral:     Discharge planning Services  CM Consult  Post Acute Care Choice:    Choice offered to:     DME Arranged:    DME Agency:     HH Arranged:    HH Agency:     Status of Service:     If discussed at [employer], dates discussed:    Additional Comments:  Maryclare Labrador, RN 06/10/2017, 2:33 PM

## 2017-06-10 NOTE — Plan of Care (Signed)
  Problem: Skin Integrity: Goal: Risk for impaired skin integrity will decrease Outcome: Progressing   Problem: Education: Goal: Knowledge of disease or condition will improve Outcome: Progressing Goal: Knowledge of the prescribed therapeutic regimen will improve Outcome: Progressing   Problem: Activity: Goal: Ability to tolerate increased activity will improve Outcome: Progressing Goal: Will verbalize the importance of balancing activity with adequate rest periods Outcome: Progressing   Problem: Respiratory: Goal: Ability to maintain a clear airway will improve Outcome: Progressing Goal: Levels of oxygenation will improve Outcome: Progressing Goal: Ability to maintain adequate ventilation will improve Outcome: Progressing   Problem: Education: Goal: Ability to demonstrate management of disease process will improve Outcome: Progressing Goal: Ability to verbalize understanding of medication therapies will improve Outcome: Progressing   Problem: Activity: Goal: Capacity to carry out activities will improve Outcome: Progressing   Problem: Cardiac: Goal: Ability to achieve and maintain adequate cardiopulmonary perfusion will improve Outcome: Progressing   Problem: Spiritual Needs Goal: Ability to function at adequate level Outcome: Progressing

## 2017-06-11 LAB — RENAL FUNCTION PANEL
ALBUMIN: 2.1 g/dL — AB (ref 3.5–5.0)
ANION GAP: 9 (ref 5–15)
BUN: 58 mg/dL — ABNORMAL HIGH (ref 6–20)
CO2: 22 mmol/L (ref 22–32)
Calcium: 7.9 mg/dL — ABNORMAL LOW (ref 8.9–10.3)
Chloride: 108 mmol/L (ref 101–111)
Creatinine, Ser: 3.76 mg/dL — ABNORMAL HIGH (ref 0.44–1.00)
GFR calc non Af Amer: 12 mL/min — ABNORMAL LOW (ref >60)
GFR, EST AFRICAN AMERICAN: 14 mL/min — AB (ref >60)
GLUCOSE: 107 mg/dL — AB (ref 65–99)
PHOSPHORUS: 4.8 mg/dL — AB (ref 2.5–4.6)
POTASSIUM: 3.9 mmol/L (ref 3.5–5.1)
SODIUM: 139 mmol/L (ref 135–145)

## 2017-06-11 MED ORDER — LIVING BETTER WITH HEART FAILURE BOOK: Freq: Once | Status: DC

## 2017-06-11 NOTE — Progress Notes (Addendum)
PROGRESS NOTE    Diana Walters  OFB:[id number] DOB: 09-08-1955 DOA: 06/05/2017 PCP: Renato Shin, MD    Brief Narrative: 62 y.o. female with a PMH of hypertension, thoracic aortic aneurysm status post stent graft repair at [facility] in 2015 with small contained leak, history of CVA who was admitted by the critical care team on 06/05/17 for treatment of respiratory distress/acute respiratory failure, acute renal failure. Cardiology was also consulted for elevated troponin on admission.  Summary of events by date: 06/05/17: Admitted by critical care team to the ICU. Dopplers negative for DVT. Placed on BiPAP. NG tube placed. Bradycardic into the 30s. Troponins elevated. Cardiology consulted and advised conservative care for now. Hyperkalemic. KUB ordered to evaluate abdominal pain: Negative for acute process. Lasix given for respiratory distress. 06/06/17: Weaned to nasal cannula. Placed on azithromycin for community-acquired pneumonia. Respiratory virus panel negative. Lasix increased by cardiology for acute systolic CHF. EF 35-40 percent on Echo, which was a change from her prior echo. Transferred to the SDU. 06/07/17: Nephrology consultation requested for creatinine elevation to 4.12. 06/08/17: Hemoglobin 6.6. Received PRBCs. Remains bradycardic at times with heart rate into the 30s. Beta blocker held. 06/09/17: Started on low-dose Toprol by cardiology. Hemoglobin 7.6. 06/10/17: TRH assumes care of patient. 06/11/2017 patient resting in bed comfortably.  She slept well she walked in the room.  She denies any chest pain or shortness of breath at this time.   Assessment & Plan:   Principal Problem:   Acute respiratory failure (HCC) Active Problems:   Rheumatoid arthritis (HCC)   Heart murmur, systolic   Hypertensive cardiomegaly   Elevated troponin   Acute systolic CHF (congestive heart failure) (HCC)   Acute renal failure superimposed on stage 3 chronic kidney disease (HCC)   Anemia of chronic  disease   Bradycardia Acute respiratory failure (HCC) Resolved.  Patient is breathing comfortably.  Thought to be from acute systolic CHF however is on azithromycin with a recommended treatment course of 5 days per pulmonology.  Chest x-ray 425 showed cardiomegaly with diffuse bilateral pulmonary infiltrates or edema.  Active problems:  Acute on chronic stage III kidney disease with baseline creatinine 1.8-2.0 Was hemodynamically mediated.  Renal ultrasound done back in July 2018 showed echogenic kidneys.  Serum creatinine down to 3.76. Nephrology following and adjusting diuretic therapy.  Will need outpatient nephrology follow-up.  Anemia of chronic disease/possible acute blood loss anemia contributory Received 1 unit of PRBCs for hemoglobin of 6.6.  Given IV iron and Aranesp as well.  Will need GI evaluation once stable.  If hemoglobin stable, this can likely be done as an outpatient.  Hemoglobin currently 7.6 mg/dL.  Fecal occult blood testing ordered.  Will reorder FOBT.  Acute systolic CHF with elevated troponin EF 35-40% by echocardiography, which is new.  Cardiology following.  Continue current therapy including Lipitor, Imdur, and hydralazine.  Volume status appears to be stable.  Cardiology has added low-dose metoprolol XL however she has recurrent bradycardia and has not tolerated this.  Will change to metoprolol 12.5 mg twice daily and hold for heart rate less than 60.    Hypertension Blood pressure stable on current therapy.  Rheumatoid arthritis Has been treated with methotrexate in the past.  Bradycardia Hold beta-blocker with parameters.  Body mass index is 23.57 kg/m.      DVT prophylaxis: SCD Code Status: Full code Family Communication: Daughter was in the bathroom when I was talking to the patient. Disposition Plan Will obtain PT evaluation.  plan for  discharge once hemoglobin and creatinine improving and stable.  Consultants:  Cardiology, nephrology,  pulmonary.  Procedures: None Antimicrobials: Azithromycin  Subjective: Feels better slept well walked in the in the room.  Objective: Vitals:   06/10/17 2321 06/11/17 0321 06/11/17 0622 06/11/17 0804  BP: (!) 125/50 (!) 112/44  135/65  Pulse: 68 64  68  Resp: 13 18  (!) 21  Temp: 98.3 F (36.8 C) 98.1 F (36.7 C)  98.5 F (36.9 C)  TempSrc: Oral Oral  Oral  SpO2: 99% 99%  96%  Weight:   67.7 kg (149 lb 4 oz)   Height:        Intake/Output Summary (Last 24 hours) at 06/11/2017 1056 Last data filed at 06/11/2017 1000 Gross per 24 hour  Intake 1080 ml  Output 1099 ml  Net -19 ml   Filed Weights   06/09/17 0500 06/10/17 0604 06/11/17 0622  Weight: 67.9 kg (149 lb 9.6 oz) 68.3 kg (150 lb 8 oz) 67.7 kg (149 lb 4 oz)    Examination:  General exam: Appears calm and comfortable  Respiratory system: Decreased breath sounds at the bases to auscultation. Respiratory effort normal. Cardiovascular system: S1 & S2 heard, RRR. No JVD, murmurs, rubs, gallops or clicks. No pedal edema. Gastrointestinal system: Abdomen is nondistended, soft and nontender. No organomegaly or masses felt. Normal bowel sounds heard. Central nervous system: Alert and oriented. No focal neurological deficits. Extremities: Symmetric 5 x 5 power. Skin: No rashes, lesions or ulcers Psychiatry: Judgement and insight appear normal. Mood & affect appropriate.     Data Reviewed: I have personally reviewed following labs and imaging studies  CBC: Recent Labs  Lab 06/05/17 0846  06/06/17 0115 06/08/17 0150 06/08/17 0552 06/08/17 1041 06/09/17 0230  WBC 15.3*  --  17.0* 10.5 9.5 9.1 8.7  NEUTROABS 13.5*  --   --  8.9*  --   --  6.3  HGB 7.7*   < > 7.9* 6.6* 6.7* 7.6* 7.6*  HCT 24.1*   < > 23.8* 20.6* 20.5* 23.3* 22.8*  MCV 70.7*  --  70.2* 69.1* 69.7* 70.6* 72.8*  PLT 411*  --  361 359 361 408* 332   < > = values in this interval not displayed.   Basic Metabolic Panel: Recent Labs  Lab 06/05/17 0811   06/06/17 0115 06/07/17 0429 06/08/17 0150 06/09/17 0230 06/10/17 0244 06/11/17 0230  NA 142   < > 140 138 138 139 139 139  K 5.2*   < > 4.1 3.5 3.4* 3.5 3.8 3.9  CL 109   < > 110 109 106 109 108 108  CO2 18*  --  16* 19* 19* 20* 19* 22  GLUCOSE 132*   < > 91 111* 112* 103* 98 107*  BUN 40*   < > 41* 53* 55* 56* 59* 58*  CREATININE 3.66*   < > 3.89* 4.12* 4.01* 3.85* 3.97* 3.76*  CALCIUM 8.4*  --  7.8* 7.5* 7.6* 7.6*  7.6* 7.6* 7.9*  MG 2.2  --  2.1  --   --   --   --   --   PHOS 5.4*  --  5.0*  --   --  4.5 4.9* 4.8*   < > = values in this interval not displayed.   GFR: Estimated Creatinine Clearance: 14.9 mL/min (A) (by C-G formula based on SCr of 3.76 mg/dL (H)). Liver Function Tests: Recent Labs  Lab 06/05/17 0811 06/09/17 0230 06/10/17 0244 06/11/17 0230  AST 43*  --   --   --  ALT 14  --   --   --   ALKPHOS 76  --   --   --   BILITOT 1.6*  --   --   --   PROT 7.6  --   --   --   ALBUMIN 2.7* 2.0* 2.1* 2.1*   No results for input(s): LIPASE, AMYLASE in the last 168 hours. No results for input(s): AMMONIA in the last 168 hours. Coagulation Profile: Recent Labs  Lab 06/05/17 0846  INR 1.26   Cardiac Enzymes: Recent Labs  Lab 06/05/17 1324 06/06/17 0115 06/06/17 0658  TROPONINI 0.21* 0.15* 0.11*   BNP (last 3 results) No results for input(s): PROBNP in the last 8760 hours. HbA1C: No results for input(s): HGBA1C in the last 72 hours. CBG: No results for input(s): GLUCAP in the last 168 hours. Lipid Profile: No results for input(s): CHOL, HDL, LDLCALC, TRIG, CHOLHDL, LDLDIRECT in the last 72 hours. Thyroid Function Tests: No results for input(s): TSH, T4TOTAL, FREET4, T3FREE, THYROIDAB in the last 72 hours. Anemia Panel: Recent Labs    06/09/17 0230  FERRITIN 299  TIBC 165*  IRON 17*   Sepsis Labs: Recent Labs  Lab 06/05/17 0816 06/05/17 1038 06/06/17 0115  PROCALCITON  --   --  3.46  LATICACIDVEN 2.28* 1.92* 1.6    Recent Results (from  the past 240 hour(s))  MRSA PCR Screening     Status: None   Collection Time: 06/05/17  6:58 PM  Result Value Ref Range Status   MRSA by PCR NEGATIVE NEGATIVE Final    Comment:        The GeneXpert MRSA Assay (FDA approved for NASAL specimens only), is one component of a comprehensive MRSA colonization surveillance program. It is not intended to diagnose MRSA infection nor to guide or monitor treatment for MRSA infections. Performed at [facility], [place] [address], [place], [place] [address]   Respiratory Panel by PCR     Status: None   Collection Time: 06/06/17 12:41 PM  Result Value Ref Range Status   Adenovirus NOT DETECTED NOT DETECTED Final   Coronavirus 229E NOT DETECTED NOT DETECTED Final   Coronavirus HKU1 NOT DETECTED NOT DETECTED Final   Coronavirus NL63 NOT DETECTED NOT DETECTED Final   Coronavirus OC43 NOT DETECTED NOT DETECTED Final   Metapneumovirus NOT DETECTED NOT DETECTED Final   Rhinovirus / Enterovirus NOT DETECTED NOT DETECTED Final   Influenza A NOT DETECTED NOT DETECTED Final   Influenza B NOT DETECTED NOT DETECTED Final   Parainfluenza Virus 1 NOT DETECTED NOT DETECTED Final   Parainfluenza Virus 2 NOT DETECTED NOT DETECTED Final   Parainfluenza Virus 3 NOT DETECTED NOT DETECTED Final   Parainfluenza Virus 4 NOT DETECTED NOT DETECTED Final   Respiratory Syncytial Virus NOT DETECTED NOT DETECTED Final   Bordetella pertussis NOT DETECTED NOT DETECTED Final   Chlamydophila pneumoniae NOT DETECTED NOT DETECTED Final   Mycoplasma pneumoniae NOT DETECTED NOT DETECTED Final    Comment: Performed at [facility], [place] [address] [place], [place] [address]         Radiology Studies: No results found.      Scheduled Meds: . amLODipine  5 mg Oral BID  . atorvastatin  40 mg Oral q1800  . furosemide  40 mg Oral Daily  . hydrALAZINE  75 mg Oral Q8H  . isosorbide mononitrate  60 mg Oral Daily  . metoprolol tartrate  12.5 mg Oral BID  .  pantoprazole  40 mg Oral Q1200   Continuous Infusions: . sodium chloride    . sodium chloride       LOS: 6 days     Georgette Shell, MD [facility] If 7PM-7AM, please contact night-coverage [email or web address] Password [facility] 06/11/2017, 10:56 AM

## 2017-06-11 NOTE — Evaluation (Signed)
Physical Therapy Evaluation/ discharge Patient Details Name: Diana Walters MRN: [id number] DOB: December 14, 1955 Today's Date: 06/11/2017   History of Present Illness  61 yo admitted 4/21 with acute respiratory and renal failure, acute CHF. PMHx: HTN, thoracic aneurysm repair, CVA  Clinical Impression  Pt very pleasant and reports moving around in room throughout the day. Pt lives alone and cares for herself, does not use DME and reports no deficits from CVA. Pt able to perform long hall ambulation with partial jogging at initiation of gait, no LOB with head turns and ability to complete stairs with rail without need for assist. Pt at baseline functional level with SpO2 98% and HR 68-86. Pt without further need for therapy and encouraged to walk in halls daily, pt agreeable, will sign off.     Follow Up Recommendations No PT follow up    Equipment Recommendations  None recommended by PT    Recommendations for Other Services       Precautions / Restrictions Precautions Precautions: None      Mobility  Bed Mobility               General bed mobility comments: EOB on arrival and end of session  Transfers Overall transfer level: Independent                  Ambulation/Gait Ambulation/Gait assistance: Independent Ambulation Distance (Feet): 400 Feet Assistive device: None Gait Pattern/deviations: WFL(Within Functional Limits)   Gait velocity interpretation: >4.37 ft/sec, indicative of normal walking speed    Stairs Stairs: Yes Stairs assistance: Modified independent (Device/Increase time) Stair Management: One rail Right;Alternating pattern Number of Stairs: 3    Wheelchair Mobility    Modified Rankin (Stroke Patients Only)       Balance Overall balance assessment: No apparent balance deficits (not formally assessed)                                           Pertinent Vitals/Pain Pain Assessment: No/denies pain    Home Living  Family/patient expects to be discharged to:: Private residence Living Arrangements: Alone Available Help at Discharge: Family;Available PRN/intermittently Type of Home: House Home Access: Stairs to enter Entrance Stairs-Rails: Right Entrance Stairs-Number of Steps: 3 Home Layout: One level Home Equipment: Walker - 2 wheels;Cane - single point      Prior Function Level of Independence: Independent               Hand Dominance   Dominant Hand: Right    Extremity/Trunk Assessment   Upper Extremity Assessment Upper Extremity Assessment: Overall WFL for tasks assessed    Lower Extremity Assessment Lower Extremity Assessment: Overall WFL for tasks assessed    Cervical / Trunk Assessment Cervical / Trunk Assessment: Normal  Communication   Communication: No difficulties  Cognition Arousal/Alertness: Awake/alert Behavior During Therapy: WFL for tasks assessed/performed Overall Cognitive Status: Within Functional Limits for tasks assessed                                        General Comments      Exercises     Assessment/Plan    PT Assessment Patent does not need any further PT services  PT Problem List         PT Treatment Interventions  PT Goals (Current goals can be found in the Care Plan section)  Acute Rehab PT Goals PT Goal Formulation: All assessment and education complete, DC therapy    Frequency     Barriers to discharge        Co-evaluation               AM-PAC PT "6 Clicks" Daily Activity  Outcome Measure Difficulty turning over in bed (including adjusting bedclothes, sheets and blankets)?: None Difficulty moving from lying on back to sitting on the side of the bed? : None Difficulty sitting down on and standing up from a chair with arms (e.g., wheelchair, bedside commode, etc,.)?: None Help needed moving to and from a bed to chair (including a wheelchair)?: None Help needed walking in hospital room?:  None Help needed climbing 3-5 steps with a railing? : None 6 Click Score: 24    End of Session   Activity Tolerance: Patient tolerated treatment well Patient left: in bed;with call bell/phone within reach(EOB) Nurse Communication: Mobility status PT Visit Diagnosis: Other abnormalities of gait and mobility (R26.89)    Time: 3845-3646 PT Time Calculation (min) (ACUTE ONLY): 14 min   Charges:   PT Evaluation $PT Eval Low Complexity: 1 Low     PT G CodesElwyn Reach, PT [phone]   Billye Pickerel B Albirda Shiel 06/11/2017, 1:41 PM

## 2017-06-11 NOTE — Progress Notes (Signed)
[facility] NEPHROLOGY PROGRESS NOTE  Assessment/ Plan: Pt is a 62 y.o. yo female  with hypertension, thoracic aortic aneurysm repair, COPD, rheumatoid arthritis, CKD stage III with baseline serum creatinine level around 1.8-2s about a year ago, follows with Dr. Lorrene Reid at [facility], admitted with shortness of breath and gradually worsening lower extremity edema.  She was not taking medication at home. Admitted with CHF (BNP >4500), acute pulm edema and worsening renal failure.  Assessment/Plan:  #Acute on chronic kidney disease stage III likely hemodynamically mediated in the setting of CHF with poor perfusion to the kidney, anemia vs progressive CKD.  The ultrasound of kidneys done on 08/2016 showed echogenic kidney without hydronephrosis. -Serum creatinine level trending down to 3.76 today.  Patient is on room air and has no edema.  She is nonoliguric and has no symptoms of uremia.  Recommended to continue oral Lasix daily and follow-up at [facility] in 1-2 weeks.  Okay to discharge from nephrology perspective. -PTH 180.  #Anemia multifactorial including chronic kidney disease and may have GI bleed: Received blood transfusion.  Iron is studies with saturation of 10% with serum iron of 17.  Received a dose of IV Feraheme and aranesp on 4/26.  Recommend oral iron on discharge.  #Acute systolic CHF: pt was not taking diuretics at home.  EF of 35 to 40%.  Continue to monitor.  Cardiology following.  On Lipitor, Imdur and hydralazine.  #Hypertension: Blood pressure acceptable.    Continue to monitor.  Continue current medication.  I will sign off at this time.  Please call us with question.  Patient needs follow-up with [facility] in 1-2 weeks.  Discussed with the patient and she understands.  Subjective: Seen and examined bedside.  No headache, dizziness, nausea vomiting chest pain. Objective Vital signs in last 24 hours: Vitals:   06/10/17 2321 06/11/17 0321 06/11/17 0622 06/11/17 0804  BP: (!)  125/50 (!) 112/44  135/65  Pulse: 68 64  68  Resp: 13 18  (!) 21  Temp: 98.3 F (36.8 C) 98.1 F (36.7 C)  98.5 F (36.9 C)  TempSrc: Oral Oral  Oral  SpO2: 99% 99%  96%  Weight:   67.7 kg (149 lb 4 oz)   Height:       Weight change: -0.566 kg (-1 lb 4 oz)  Intake/Output Summary (Last 24 hours) at 06/11/2017 0825 Last data filed at 06/11/2017 0623 Gross per 24 hour  Intake 1080 ml  Output 800 ml  Net 280 ml       Labs: Basic Metabolic Panel: Recent Labs  Lab 06/09/17 0230 06/10/17 0244 06/11/17 0230  NA 139 139 139  K 3.5 3.8 3.9  CL 109 108 108  CO2 20* 19* 22  GLUCOSE 103* 98 107*  BUN 56* 59* 58*  CREATININE 3.85* 3.97* 3.76*  CALCIUM 7.6*  7.6* 7.6* 7.9*  PHOS 4.5 4.9* 4.8*   Liver Function Tests: Recent Labs  Lab 06/05/17 0811 06/09/17 0230 06/10/17 0244 06/11/17 0230  AST 43*  --   --   --   ALT 14  --   --   --   ALKPHOS 76  --   --   --   BILITOT 1.6*  --   --   --   PROT 7.6  --   --   --   ALBUMIN 2.7* 2.0* 2.1* 2.1*   No results for input(s): LIPASE, AMYLASE in the last 168 hours. No results for input(s): AMMONIA in the last  168 hours. CBC: Recent Labs  Lab 06/05/17 0846  06/06/17 0115 06/08/17 0150 06/08/17 0552 06/08/17 1041 06/09/17 0230  WBC 15.3*  --  17.0* 10.5 9.5 9.1 8.7  NEUTROABS 13.5*  --   --  8.9*  --   --  6.3  HGB 7.7*   < > 7.9* 6.6* 6.7* 7.6* 7.6*  HCT 24.1*   < > 23.8* 20.6* 20.5* 23.3* 22.8*  MCV 70.7*  --  70.2* 69.1* 69.7* 70.6* 72.8*  PLT 411*  --  361 359 361 408* 332   < > = values in this interval not displayed.   Cardiac Enzymes: Recent Labs  Lab 06/05/17 1324 06/06/17 0115 06/06/17 0658  TROPONINI 0.21* 0.15* 0.11*   CBG: No results for input(s): GLUCAP in the last 168 hours.  Iron Studies:  Recent Labs    06/09/17 0230  IRON 17*  TIBC 165*  FERRITIN 299   Studies/Results: No results found.  Medications: Infusions: . sodium chloride    . sodium chloride      Scheduled  Medications: . amLODipine  5 mg Oral BID  . atorvastatin  40 mg Oral q1800  . furosemide  40 mg Oral Daily  . hydrALAZINE  75 mg Oral Q8H  . isosorbide mononitrate  60 mg Oral Daily  . metoprolol tartrate  12.5 mg Oral BID  . pantoprazole  40 mg Oral Q1200    have reviewed scheduled and prn medications.  Physical Exam: General: Lying flat on bed, not in distress Heart: Regular rate rhythm S1-S2 normal Lungs: Clear bilateral Abdomen:soft, Non-tender, non-distended Extremities: No lower extremity edema.   Shanequa Whitenight Prasad Ivoree Felmlee 06/11/2017,8:25 AM  LOS: 6 days

## 2017-06-12 LAB — CBC WITH DIFFERENTIAL/PLATELET
BASOS PCT: 0 %
Basophils Absolute: 0 10*3/uL (ref 0.0–0.1)
Basophils Absolute: 0 10*3/uL (ref 0.0–0.1)
Basophils Relative: 0 %
EOS PCT: 4 %
Eosinophils Absolute: 0.3 10*3/uL (ref 0.0–0.7)
Eosinophils Absolute: 0.3 10*3/uL (ref 0.0–0.7)
Eosinophils Relative: 4 %
HEMATOCRIT: 21.5 % — AB (ref 36.0–46.0)
HEMATOCRIT: 28.4 % — AB (ref 36.0–46.0)
Hemoglobin: 7 g/dL — ABNORMAL LOW (ref 12.0–15.0)
Hemoglobin: 9.3 g/dL — ABNORMAL LOW (ref 12.0–15.0)
LYMPHS ABS: 1.5 10*3/uL (ref 0.7–4.0)
LYMPHS PCT: 13 %
Lymphocytes Relative: 17 %
Lymphs Abs: 1.2 10*3/uL (ref 0.7–4.0)
MCH: 24.2 pg — AB (ref 26.0–34.0)
MCH: 25.1 pg — ABNORMAL LOW (ref 26.0–34.0)
MCHC: 32.6 g/dL (ref 30.0–36.0)
MCHC: 32.7 g/dL (ref 30.0–36.0)
MCV: 74.4 fL — AB (ref 78.0–100.0)
MCV: 76.5 fL — AB (ref 78.0–100.0)
MONOS PCT: 8 %
MONOS PCT: 10 %
Monocytes Absolute: 0.7 10*3/uL (ref 0.1–1.0)
Monocytes Absolute: 1 10*3/uL (ref 0.1–1.0)
NEUTROS ABS: 6.7 10*3/uL (ref 1.7–7.7)
Neutro Abs: 6.1 10*3/uL (ref 1.7–7.7)
Neutrophils Relative %: 71 %
Neutrophils Relative %: 73 %
PLATELETS: 305 10*3/uL (ref 150–400)
Platelets: 364 10*3/uL (ref 150–400)
RBC: 2.89 MIL/uL — ABNORMAL LOW (ref 3.87–5.11)
RBC: 3.71 MIL/uL — ABNORMAL LOW (ref 3.87–5.11)
RDW: 22.8 % — ABNORMAL HIGH (ref 11.5–15.5)
RDW: 22.5 % — ABNORMAL HIGH (ref 11.5–15.5)
WBC: 8.6 10*3/uL (ref 4.0–10.5)
WBC: 9.4 10*3/uL (ref 4.0–10.5)

## 2017-06-12 LAB — BASIC METABOLIC PANEL
Anion gap: 11 (ref 5–15)
BUN: 54 mg/dL — AB (ref 6–20)
CO2: 23 mmol/L (ref 22–32)
Calcium: 8 mg/dL — ABNORMAL LOW (ref 8.9–10.3)
Chloride: 107 mmol/L (ref 101–111)
Creatinine, Ser: 4.05 mg/dL — ABNORMAL HIGH (ref 0.44–1.00)
GFR calc Af Amer: 13 mL/min — ABNORMAL LOW (ref >60)
GFR, EST NON AFRICAN AMERICAN: 11 mL/min — AB (ref >60)
GLUCOSE: 88 mg/dL (ref 65–99)
POTASSIUM: 4.2 mmol/L (ref 3.5–5.1)
Sodium: 141 mmol/L (ref 135–145)

## 2017-06-12 LAB — PREPARE RBC (CROSSMATCH)

## 2017-06-12 LAB — RENAL FUNCTION PANEL
Albumin: 2.1 g/dL — ABNORMAL LOW (ref 3.5–5.0)
Anion gap: 11 (ref 5–15)
BUN: 54 mg/dL — AB (ref 6–20)
CHLORIDE: 107 mmol/L (ref 101–111)
CO2: 22 mmol/L (ref 22–32)
CREATININE: 4.01 mg/dL — AB (ref 0.44–1.00)
Calcium: 8 mg/dL — ABNORMAL LOW (ref 8.9–10.3)
GFR calc Af Amer: 13 mL/min — ABNORMAL LOW (ref >60)
GFR calc non Af Amer: 11 mL/min — ABNORMAL LOW (ref >60)
GLUCOSE: 90 mg/dL (ref 65–99)
Phosphorus: 5 mg/dL — ABNORMAL HIGH (ref 2.5–4.6)
Potassium: 4.2 mmol/L (ref 3.5–5.1)
Sodium: 140 mmol/L (ref 135–145)

## 2017-06-12 LAB — OCCULT BLOOD X 1 CARD TO LAB, STOOL: Fecal Occult Bld: NEGATIVE

## 2017-06-12 MED ORDER — METOPROLOL SUCCINATE ER 25 MG PO TB24
25.0000 mg | ORAL_TABLET | Freq: Every day | ORAL | Status: DC
  Administered 2017-06-13: 25 mg via ORAL
  Filled 2017-06-12: qty 1

## 2017-06-12 MED ORDER — SODIUM CHLORIDE 0.9 % IV SOLN: Freq: Once | INTRAVENOUS | Status: DC

## 2017-06-12 NOTE — Progress Notes (Signed)
PROGRESS NOTE    Diana Walters  UXN:[id number] DOB: [birth year] DOA: 06/05/2017 PCP: Renato Shin, MD  Brief Narrative: 62 y.o.femalewith a PMH of hypertension, thoracic aortic aneurysm status post stent graft repair at [facility] in 2015 with small contained leak, history of CVA who was admitted by the critical care team on 06/05/17 for treatment of respiratory distress/acute respiratory failure, acute renal failure. Cardiology was also consulted for elevated troponin on admission.  Summary of events by date: 06/05/17: Admitted by critical care team to the ICU. Dopplers negative for DVT. Placed on BiPAP. NG tube placed. Bradycardic into the 30s. Troponins elevated. Cardiology consulted and advised conservative care for now. Hyperkalemic. KUB ordered to evaluate abdominal pain: Negative for acute process. Lasix given for respiratory distress. 06/06/17: Weaned to nasal cannula. Placed on azithromycin for community-acquired pneumonia. Respiratory virus panel negative. Lasix increased by cardiology for acute systolic CHF. EF 35-40 percent on Echo, which was a change from her prior echo. Transferred tothe SDU. 06/07/17: Nephrology consultation requested for creatinine elevation to 4.12. 06/08/17: Hemoglobin 6.6. Received PRBCs. Remains bradycardic at times with heart rate into the 30s. Beta blocker held. 06/09/17: Started on low-dose Toprol by cardiology. Hemoglobin 7.6. 06/10/17: TRH assumes care of patient. 06/11/2017 patient resting in bed comfortably.  She slept well she walked in the room.  She denies any chest pain or shortness of breath at this time.      Assessment & Plan:   Principal Problem:   Acute respiratory failure (HCC) Active Problems:   Rheumatoid arthritis (HCC)   Heart murmur, systolic   Hypertensive cardiomegaly   Elevated troponin   Acute systolic CHF (congestive heart failure) (HCC)   Acute renal failure superimposed on stage 3 chronic kidney disease (HCC)   Anemia of chronic  disease   Bradycardia Acute respiratory failure (HCC) Resolved. Patient is breathing comfortably. Thought to be from acute systolic CHF however is on azithromycin with a recommended treatment course of 5 days per pulmonology.  Chest x-ray 425 showed cardiomegaly with diffuse bilateral pulmonary infiltrates or edema.  Active problems:  Acute on chronic stage III kidney disease with baseline creatinine 1.8-2.0 Was hemodynamically mediated. Renal ultrasound done back in July 2018 showed echogenic kidneys. Serum creatinine down to 4.01.Nephrology following and adjusting diuretic therapy. Will need outpatient nephrology follow-up.  Anemia of chronic disease/possible acute blood loss anemia contributory Received 1 unit of PRBCs for hemoglobin of 6.6. Given IV iron and Aranesp as well. Will need GI evaluation once stable.  Hemoglobin again down to 7.0.  I still do not see a fecal occult blood test done.  Spoke to the nurse in person to do a FOBT today.  Will consult GI during this admission if FOBT is positive.  Patient reports having bowel movements.  Happe in color.  She has seen some fresh blood occasionally.  Acute systolic CHFwith elevated troponin EF 35-40% by echocardiography, which is new. Cardiology following. Continue current therapy including Lipitor, Imdur, and hydralazine. Volume status appears to be stable. Cardiology has added low-dose metoprolol XL however she has recurrent bradycardia and has not tolerated this. Will change to metoprolol 12.5 mg twice daily and hold for heart rate less than 60.  Hypertension Blood pressure stable on current therapy.  Rheumatoid arthritis Has been treated with methotrexate in the past.  Bradycardia Hold beta-blocker with parameters.        DVT prophylaxis: SCD Code Status full code Family Communication: No family available Disposition Plan: To be determined Consultants: Nephrology cardiology  Antimicrobials:   azithromycin Subjective: Feeling well ambulated in the bedroom.   Objective: Vitals:   06/12/17 0341 06/12/17 0347 06/12/17 0509 06/12/17 0817  BP: 134/66  (!) 129/53 (!) 142/62  Pulse: 70   69  Resp: 16   16  Temp: 98.1 F (36.7 C)   97.9 F (36.6 C)  TempSrc: Oral   Oral  SpO2: 100%   100%  Weight:  68.1 kg (150 lb 3.2 oz)    Height:        Intake/Output Summary (Last 24 hours) at 06/12/2017 0937 Last data filed at 06/12/2017 0348 Gross per 24 hour  Intake 780 ml  Output 1224 ml  Net -444 ml   Filed Weights   06/10/17 0604 06/11/17 0622 06/12/17 0347  Weight: 68.3 kg (150 lb 8 oz) 67.7 kg (149 lb 4 oz) 68.1 kg (150 lb 3.2 oz)    Examination:  General exam: Appears calm and comfortable  Respiratory system: Clear to auscultation. Respiratory effort normal. Cardiovascular system: S1 & S2 heard, RRR. No JVD, murmurs, rubs, gallops or clicks. No pedal edema. Gastrointestinal system: Abdomen is nondistended, soft and nontender. No organomegaly or masses felt. Normal bowel sounds heard. Central nervous system: Alert and oriented. No focal neurological deficits. Extremities: Symmetric 5 x 5 power. Skin: No rashes, lesions or ulcers Psychiatry: Judgement and insight appear normal. Mood & affect appropriate.     Data Reviewed: I have personally reviewed following labs and imaging studies  CBC: Recent Labs  Lab 06/08/17 0150 06/08/17 0552 06/08/17 1041 06/09/17 0230 06/12/17 0254  WBC 10.5 9.5 9.1 8.7 8.6  NEUTROABS 8.9*  --   --  6.3 6.1  HGB 6.6* 6.7* 7.6* 7.6* 7.0*  HCT 20.6* 20.5* 23.3* 22.8* 21.5*  MCV 69.1* 69.7* 70.6* 72.8* 74.4*  PLT 359 361 408* 332 175   Basic Metabolic Panel: Recent Labs  Lab 06/06/17 0115  06/08/17 0150 06/09/17 0230 06/10/17 0244 06/11/17 0230 06/12/17 0254  NA 140   < > 138 139 139 139 140  141  K 4.1   < > 3.4* 3.5 3.8 3.9 4.2  4.2  CL 110   < > 106 109 108 108 107  107  CO2 16*   < > 19* 20* 19* 22 22  23   GLUCOSE 91    < > 112* 103* 98 107* 90  88  BUN 41*   < > 55* 56* 59* 58* 54*  54*  CREATININE 3.89*   < > 4.01* 3.85* 3.97* 3.76* 4.01*  4.05*  CALCIUM 7.8*   < > 7.6* 7.6*  7.6* 7.6* 7.9* 8.0*  8.0*  MG 2.1  --   --   --   --   --   --   PHOS 5.0*  --   --  4.5 4.9* 4.8* 5.0*   < > = values in this interval not displayed.   GFR: Estimated Creatinine Clearance: 14 mL/min (A) (by C-G formula based on SCr of 4.01 mg/dL (H)). Liver Function Tests: Recent Labs  Lab 06/09/17 0230 06/10/17 0244 06/11/17 0230 06/12/17 0254  ALBUMIN 2.0* 2.1* 2.1* 2.1*   No results for input(s): LIPASE, AMYLASE in the last 168 hours. No results for input(s): AMMONIA in the last 168 hours. Coagulation Profile: No results for input(s): INR, PROTIME in the last 168 hours. Cardiac Enzymes: Recent Labs  Lab 06/05/17 1324 06/06/17 0115 06/06/17 0658  TROPONINI 0.21* 0.15* 0.11*   BNP (last 3 results) No results for input(s): PROBNP  in the last 8760 hours. HbA1C: No results for input(s): HGBA1C in the last 72 hours. CBG: No results for input(s): GLUCAP in the last 168 hours. Lipid Profile: No results for input(s): CHOL, HDL, LDLCALC, TRIG, CHOLHDL, LDLDIRECT in the last 72 hours. Thyroid Function Tests: No results for input(s): TSH, T4TOTAL, FREET4, T3FREE, THYROIDAB in the last 72 hours. Anemia Panel: No results for input(s): VITAMINB12, FOLATE, FERRITIN, TIBC, IRON, RETICCTPCT in the last 72 hours. Sepsis Labs: Recent Labs  Lab 06/05/17 1038 06/06/17 0115  PROCALCITON  --  3.46  LATICACIDVEN 1.92* 1.6    Recent Results (from the past 240 hour(s))  MRSA PCR Screening     Status: None   Collection Time: 06/05/17  6:58 PM  Result Value Ref Range Status   MRSA by PCR NEGATIVE NEGATIVE Final    Comment:        The GeneXpert MRSA Assay (FDA approved for NASAL specimens only), is one component of a comprehensive MRSA colonization surveillance program. It is not intended to diagnose MRSA infection  nor to guide or monitor treatment for MRSA infections. Performed at [facility], [place] [address], [place], [place] [address]   Respiratory Panel by PCR     Status: None   Collection Time: 06/06/17 12:41 PM  Result Value Ref Range Status   Adenovirus NOT DETECTED NOT DETECTED Final   Coronavirus 229E NOT DETECTED NOT DETECTED Final   Coronavirus HKU1 NOT DETECTED NOT DETECTED Final   Coronavirus NL63 NOT DETECTED NOT DETECTED Final   Coronavirus OC43 NOT DETECTED NOT DETECTED Final   Metapneumovirus NOT DETECTED NOT DETECTED Final   Rhinovirus / Enterovirus NOT DETECTED NOT DETECTED Final   Influenza A NOT DETECTED NOT DETECTED Final   Influenza B NOT DETECTED NOT DETECTED Final   Parainfluenza Virus 1 NOT DETECTED NOT DETECTED Final   Parainfluenza Virus 2 NOT DETECTED NOT DETECTED Final   Parainfluenza Virus 3 NOT DETECTED NOT DETECTED Final   Parainfluenza Virus 4 NOT DETECTED NOT DETECTED Final   Respiratory Syncytial Virus NOT DETECTED NOT DETECTED Final   Bordetella pertussis NOT DETECTED NOT DETECTED Final   Chlamydophila pneumoniae NOT DETECTED NOT DETECTED Final   Mycoplasma pneumoniae NOT DETECTED NOT DETECTED Final    Comment: Performed at [facility], [place] [address] [place], [place] [address]         Radiology Studies: No results found.      Scheduled Meds: . amLODipine  5 mg Oral BID  . atorvastatin  40 mg Oral q1800  . furosemide  40 mg Oral Daily  . hydrALAZINE  75 mg Oral Q8H  . isosorbide mononitrate  60 mg Oral Daily  . Living Better with Heart Failure Book   Does not apply Once  . metoprolol tartrate  12.5 mg Oral BID  . pantoprazole  40 mg Oral Q1200   Continuous Infusions: . sodium chloride    . sodium chloride    . sodium chloride       LOS: 7 days    Georgette Shell, MD [facility]  If 7PM-7AM, please contact night-coverage [email or web address] Password [facility] 06/12/2017, 9:37 AM

## 2017-06-12 NOTE — Progress Notes (Signed)
Chart reviewed - on appropriate HF meds. Noted switch from Toprol XL to metoprolol tartrate 12.5 mg BID - this was a 1:1 dose switch (equivalent). No evidence of significant bradycardia (HR<50) - therefore would switch back to Toprol XL 25 mg daily. There is no clinical trial data for class effect with metoprolol for systolic CHF, however, Toprol XL, bisoprolol and carvedilol all have evidence for HF benefit.  Cardiology will sign-off. Follow-up with Dr. Harrington Challenger after discharge.  Pixie Casino, MD, [facility] [place], [place] Director of the [facility] Lip[facility]  [facility] Diplomate of the American Board of Clinical Lipidology Attending Cardiologist  Direct Dial: [phone]  Fax: [phone]  Website:  [email or web address]

## 2017-06-12 NOTE — Progress Notes (Signed)
Md notified of pt's hgb 7.0, cr 4.01. No signs of bleeding noted. Will continue to monitor. Saunders Revel T

## 2017-06-13 ENCOUNTER — Ambulatory Visit: Payer: Medicare Other | Admitting: Nurse Practitioner

## 2017-06-13 DIAGNOSIS — R710 Precipitous drop in hematocrit: Secondary | ICD-10-CM

## 2017-06-13 LAB — RENAL FUNCTION PANEL
ALBUMIN: 2.2 g/dL — AB (ref 3.5–5.0)
Anion gap: 9 (ref 5–15)
BUN: 53 mg/dL — ABNORMAL HIGH (ref 6–20)
CALCIUM: 8 mg/dL — AB (ref 8.9–10.3)
CO2: 21 mmol/L — ABNORMAL LOW (ref 22–32)
Chloride: 110 mmol/L (ref 101–111)
Creatinine, Ser: 3.81 mg/dL — ABNORMAL HIGH (ref 0.44–1.00)
GFR, EST AFRICAN AMERICAN: 13 mL/min — AB (ref >60)
GFR, EST NON AFRICAN AMERICAN: 12 mL/min — AB (ref >60)
Glucose, Bld: 95 mg/dL (ref 65–99)
PHOSPHORUS: 4.6 mg/dL (ref 2.5–4.6)
Potassium: 4.2 mmol/L (ref 3.5–5.1)
SODIUM: 140 mmol/L (ref 135–145)

## 2017-06-13 LAB — CBC WITH DIFFERENTIAL/PLATELET
BASOS ABS: 0 10*3/uL (ref 0.0–0.1)
Basophils Relative: 0 %
EOS ABS: 0.3 10*3/uL (ref 0.0–0.7)
Eosinophils Relative: 3 %
HCT: 25 % — ABNORMAL LOW (ref 36.0–46.0)
HEMOGLOBIN: 8.1 g/dL — AB (ref 12.0–15.0)
LYMPHS PCT: 16 %
Lymphs Abs: 1.6 10*3/uL (ref 0.7–4.0)
MCH: 24.8 pg — ABNORMAL LOW (ref 26.0–34.0)
MCHC: 32.4 g/dL (ref 30.0–36.0)
MCV: 76.5 fL — ABNORMAL LOW (ref 78.0–100.0)
Monocytes Absolute: 0.9 10*3/uL (ref 0.1–1.0)
Monocytes Relative: 9 %
NEUTROS ABS: 7.3 10*3/uL (ref 1.7–7.7)
NEUTROS PCT: 72 %
Platelets: 315 10*3/uL (ref 150–400)
RBC: 3.27 MIL/uL — AB (ref 3.87–5.11)
RDW: 22.4 % — ABNORMAL HIGH (ref 11.5–15.5)
WBC: 10.1 10*3/uL (ref 4.0–10.5)

## 2017-06-13 LAB — CBC
HCT: 30.1 % — ABNORMAL LOW (ref 36.0–46.0)
Hemoglobin: 9.7 g/dL — ABNORMAL LOW (ref 12.0–15.0)
MCH: 24.9 pg — ABNORMAL LOW (ref 26.0–34.0)
MCHC: 32.2 g/dL (ref 30.0–36.0)
MCV: 77.2 fL — AB (ref 78.0–100.0)
Platelets: 330 10*3/uL (ref 150–400)
RBC: 3.9 MIL/uL (ref 3.87–5.11)
RDW: 22.4 % — ABNORMAL HIGH (ref 11.5–15.5)
WBC: 10.9 10*3/uL — AB (ref 4.0–10.5)

## 2017-06-13 LAB — BPAM RBC
BLOOD PRODUCT EXPIRATION DATE: 201905152359
ISSUE DATE / TIME: 201904281111
UNIT TYPE AND RH: 5100

## 2017-06-13 LAB — TYPE AND SCREEN
ABO/RH(D): O POS
Antibody Screen: NEGATIVE
UNIT DIVISION: 0

## 2017-06-13 MED ORDER — FUROSEMIDE 40 MG PO TABS: 40.0000 mg | ORAL_TABLET | Freq: Every day | ORAL | 0 refills | Status: DC

## 2017-06-13 MED ORDER — AMLODIPINE BESYLATE 5 MG PO TABS: 5.0000 mg | ORAL_TABLET | Freq: Two times a day (BID) | ORAL | 0 refills | Status: DC

## 2017-06-13 MED ORDER — ISOSORBIDE MONONITRATE ER 60 MG PO TB24: 60.0000 mg | ORAL_TABLET | Freq: Every day | ORAL | 0 refills | Status: DC

## 2017-06-13 NOTE — Discharge Summary (Signed)
Physician Discharge Summary  Diana Walters KDX:[id number] DOB: Dec 26, 1955 DOA: 06/05/2017  PCP: Renato Shin, MD  Admit date: 06/05/2017 Discharge date: 06/13/2017  Admitted From: home Disposition:  home Recommendations for Outpatient Follow-up:  1. Follow up with PCP in 1-2 weeks 2. Please obtain BMP/CBC 06/15/2017  Home Health:yes Equipment/Devicesnone Discharge Condition:stable CODE STATUS:full Diet recommendation cardiac Brief/Interim Summary:61 y.o.femalewith a PMH of hypertension, thoracic aortic aneurysm status post stent graft repair at [facility] in 2015 with small contained leak, history of CVA who was admitted by the critical care team on 06/05/17 for treatment of respiratory distress/acute respiratory failure, acute renal failure. Cardiology was also consulted for elevated troponin on admission.  Summary of events by date: 06/05/17: Admitted by critical care team to the ICU. Dopplers negative for DVT. Placed on BiPAP. NG tube placed. Bradycardic into the 30s. Troponins elevated. Cardiology consulted and advised conservative care for now. Hyperkalemic. KUB ordered to evaluate abdominal pain: Negative for acute process. Lasix given for respiratory distress. 06/06/17: Weaned to nasal cannula. Placed on azithromycin for community-acquired pneumonia. Respiratory virus panel negative. Lasix increased by cardiology for acute systolic CHF. EF 35-40 percent on Echo, which was a change from her prior echo. Transferred tothe SDU. 06/07/17: Nephrology consultation requested for creatinine elevation to 4.12. 06/08/17: Hemoglobin 6.6. Received PRBCs. Remains bradycardic at times with heart rate into the 30s. Beta blocker held. 06/09/17: Started on low-dose Toprol by cardiology. Hemoglobin 7.6. 06/10/17: TRH assumes care of patient. 06/11/2017 patient resting in bed comfortably.  She slept well she walked in the room.  She denies any chest pain or shortness of breath at this time.     Discharge  Diagnoses:  Principal Problem:   Acute respiratory failure ([place]) Active Problems:   Rheumatoid arthritis ([place])   Heart murmur, systolic   Hypertensive cardiomegaly   Elevated troponin   Acute systolic CHF (congestive heart failure) (HCC)   Acute renal failure superimposed on stage 3 chronic kidney disease (HCC)   Anemia of chronic disease   Bradycardia  Acute respiratory failure (HCC) Resolved. Patient is breathing comfortably. Thought to be from acute systolic CHF.  Chest x-ray 425 showed cardiomegaly with diffuse bilateral pulmonary infiltrates or edema.  Continue Lasix 40 mg daily.  Active problems:  Acute on chronic stage III kidney disease with baseline creatinine 1.8-2.0 Was hemodynamically mediated. Renal ultrasound done back in July 2018 showed echogenic kidneys.  Follow-up with the nephrology.    Anemia of chronic disease-patient received 2 units of blood transfusion 1 unit of Feraheme.  Patient has a history of chronic anemia.  Patient was seen by GI during this admission who did not feel that she needs to be 6 had further work-up at this time.  She was FOBT negative.  She is a patient of Dr. Ardis Hughs.  She has had EGD and colonoscopy in 2015.  She will need outpatient hematology follow-up for anemia.  Acute systolic CHFwith elevated troponin EF 35-40% by echocardiography, which is new.  Follow-up with Dr. Harrington Challenger with cardiology.  Continue Lasix, hydralazine, Imdur, metoprolol succinate 25 mg.  Watch for bradycardia.  Hypertension Blood pressure stable on current therapy.  Rheumatoid arthritis Has been treated with methotrexate in the past.   Body mass index is 23.57 kg/m.     Discharge Instructions  Discharge Instructions    Avoid straining   Complete by:  As directed    Call MD for:  persistant dizziness or light-headedness   Complete by:  As directed    Call MD  for:  persistant nausea and vomiting   Complete by:  As directed    Call MD for:  redness,  tenderness, or signs of infection (pain, swelling, redness, odor or green/yellow discharge around incision site)   Complete by:  As directed    Diet - low sodium heart healthy   Complete by:  As directed    Heart Failure patients record your daily weight using the same scale at the same time of day   Complete by:  As directed    Increase activity slowly   Complete by:  As directed    STOP any activity that causes chest pain, shortness of breath, dizziness, sweating, or exessive weakness   Complete by:  As directed      Allergies as of 06/13/2017      Reactions   Codeine Other (See Comments)   hallucinations   Ibuprofen    Can't take due to medications      Medication List    STOP taking these medications   diphenhydrAMINE 25 mg capsule Commonly known as:  BENADRYL   triamterene-hydrochlorothiazide 37.5-25 MG tablet Commonly known as:  MAXZIDE-25     TAKE these medications   amLODipine 5 MG tablet Commonly known as:  NORVASC Take 1 tablet (5 mg total) by mouth 2 (two) times daily. What changed:    medication strength  how much to take  when to take this   aspirin EC 81 MG tablet Take 81 mg by mouth daily.   atorvastatin 40 MG tablet Commonly known as:  LIPITOR TAKE 1 TABLET BY MOUTH EVERY DAY   ferrous sulfate 325 (65 FE) MG tablet Take 1 tablet (325 mg total) daily with breakfast by mouth.   furosemide 40 MG tablet Commonly known as:  LASIX Take 1 tablet (40 mg total) by mouth daily. Start taking on:  06/14/2017   hydrALAZINE 50 MG tablet Commonly known as:  APRESOLINE Take 1.5 tablets (75 mg total) by mouth 3 (three) times daily.   isosorbide mononitrate 60 MG 24 hr tablet Commonly known as:  IMDUR Take 1 tablet (60 mg total) by mouth daily. Start taking on:  06/14/2017   levETIRAcetam 1000 MG tablet Commonly known as:  KEPPRA Take 1 tablet (1,000 mg total) by mouth 2 (two) times daily.   methotrexate 2.5 MG tablet Take 7.5 mg by mouth once a week.  Friday   metoprolol succinate 25 MG 24 hr tablet Commonly known as:  TOPROL-XL Take 1 tablet (25 mg total) by mouth daily. Take with or immediately following a meal.   multivitamin tablet Take 1 tablet by mouth daily.   REMICADE IV Inject into the vein. At rheumatology      Follow-up Information    Renato Shin, MD.   Specialty:  Endocrinology Contact information: [address] [place] [address] [phone]        Daune Perch, NP Follow up.   Specialty:  Nurse Practitioner Why:  Cardiology hospital follow up on May 8th at 9:30. Please arrive 15 minutes early for check in.  Contact information: [address] [place] [address] [phone]          Allergies  Allergen Reactions  . Codeine Other (See Comments)    hallucinations  . Ibuprofen     Can't take due to medications    Consultations:  Cardiology,gi   Procedures/Studies: Dg Chest 1 View  Result Date: 06/09/2017 CLINICAL DATA:  Shortness of breath. EXAM: CHEST  1 VIEW COMPARISON:  Chest x-ray 06/06/2017, 06/05/2017.  CT 06/05/2017. FINDINGS: Prior CABG. Aortic stent graft in stable position. Cardiac monitoring device again noted. Cardiomegaly with diffuse bilateral pulmonary infiltrates/edema again noted. Similar findings on prior exam. No pleural effusion or pneumothorax. IMPRESSION: 1. Prior CABG. Aortic stent graft in stable position. Cardiac monitor device again noted. 2. Cardiomegaly with diffuse bilateral pulmonary infiltrates/edema again noted. Similar findings on prior exam. Electronically Signed   By: Marcello Moores  Register   On: 06/09/2017 09:56   Ct Chest Wo Contrast  Result Date: 06/05/2017 CLINICAL DATA:  Worsening shortness of breath for several days. History of hypertension, thoracic aortic aneurysm. EXAM: CT CHEST WITHOUT CONTRAST TECHNIQUE: Multidetector CT imaging of the chest was performed following the standard protocol without IV contrast. COMPARISON:   Chest radiograph June 05, 2017 FINDINGS: CARDIOVASCULAR: Heart size is moderately enlarged, increased from prior CT. No pericardial effusion. Status post thoracic aorta endograft, similar noncontrast appearance. Calcific atherosclerosis aortic arch. Mild coronary artery calcifications. MEDIASTINUM/NODES: Worsening mediastinal lymphadenopathy measuring to 17 mm short axis, previously 13 mm. Assessment of lymphadenopathy limited by noncontrast CT. LUNGS/PLEURA: Small RIGHT pleural effusion. Multifocal consolidation bilateral lungs with perihilar predominance. Faint alveolar airspace opacities. Bibasilar atelectasis. Tracheobronchial tree is patent. UPPER ABDOMEN: Nonacute. 2.6 cm cyst LEFT interpolar kidney, incompletely imaged. Minimal cholelithiasis, incompletely assessed. MUSCULOSKELETAL: Nonacute. Status post median sternotomy. Mild degenerative change of lumbar spine, moderate T11-12 degenerative disc. IMPRESSION: 1. Multifocal consolidation seen with pulmonary edema and/or pneumonia. Small RIGHT pleural effusion. 2. Worsening moderate cardiomegaly. 3. Status post endograft repair of thoracic aortic aneurysm. Aortic Atherosclerosis (ICD10-I70.0). Electronically Signed   By: Elon Alas M.D.   On: 06/05/2017 13:19   Dg Chest Port 1 View  Result Date: 06/06/2017 CLINICAL DATA:  Respiratory failure, hypoxia, history of asthma. Previous thoracic aortic stent graft placement. EXAM: PORTABLE CHEST 1 VIEW COMPARISON:  Chest x-ray and chest CT scan of June 05, 2017 FINDINGS: The lungs are well-expanded. Increased interstitial and airspace opacities persist. The cardiac silhouette is enlarged. The pulmonary vascularity is not clearly engorged. There is no pleural effusion or pneumothorax. An aortic stent graft is present throughout the course of the thorax. The sternal wires are intact. IMPRESSION: Bilateral airspace opacities compatible with pneumonia or pulmonary edema. There may have been slight interval  improvement since yesterday's study. Electronically Signed   By: David  Martinique M.D.   On: 06/06/2017 07:47   Dg Chest Port 1 View  Result Date: 06/05/2017 CLINICAL DATA:  Shortness of breath today. EXAM: PORTABLE CHEST 1 VIEW COMPARISON:  CT chest 01/26/2016. FINDINGS: Extensive bilateral airspace disease is present with the most confluent opacities in the right mid and upper lung zones. Small to moderate bilateral pleural effusions are seen. No pneumothorax. Stent graft for aortic aneurysm repair is identified. No pneumothorax. IMPRESSION: Multifocal airspace disease most confluent in the right upper and mid lung zones worrisome for pneumonia. Coexistent pulmonary edema is also possible. Electronically Signed   By: Inge Rise M.D.   On: 06/05/2017 09:13   Dg Abd Portable 1v  Result Date: 06/05/2017 CLINICAL DATA:  Abdominal distension EXAM: PORTABLE ABDOMEN - 1 VIEW COMPARISON:  01/03/2015 FINDINGS: Scattered large and small bowel gas is noted without obstructive change. No free air is noted. Findings of prior thoracic aortic stent graft are seen. Calcified uterine fibroid is noted. No acute bony abnormality is noted. IMPRESSION: No acute abnormality noted. Electronically Signed   By: Inez Catalina M.D.   On: 06/05/2017 22:22    (  Echo, Carotid, EGD, Colonoscopy, ERCP)    Subjective:   Discharge Exam: Vitals:   06/13/17 0717 06/13/17 1133  BP: (!) 123/52 (!) 130/56  Pulse:    Resp: 18 17  Temp: 98.5 F (36.9 C) 98.7 F (37.1 C)  SpO2: 97% 100%   Vitals:   06/13/17 0417 06/13/17 0537 06/13/17 0717 06/13/17 1133  BP:  (!) 136/56 (!) 123/52 (!) 130/56  Pulse:      Resp: 15  18 17   Temp:   98.5 F (36.9 C) 98.7 F (37.1 C)  TempSrc:   Oral Oral  SpO2:   97% 100%  Weight: 68.2 kg (150 lb 6.4 oz)     Height:        General: Pt is alert, awake, not in acute distress Cardiovascular: RRR, S1/S2 +, no rubs, no gallops Respiratory: CTA bilaterally, no wheezing, no  rhonchi Abdominal: Soft, NT, ND, bowel sounds + Extremities: no edema, no cyanosis    The results of significant diagnostics from this hospitalization (including imaging, microbiology, ancillary and laboratory) are listed below for reference.     Microbiology: Recent Results (from the past 240 hour(s))  MRSA PCR Screening     Status: None   Collection Time: 06/05/17  6:58 PM  Result Value Ref Range Status   MRSA by PCR NEGATIVE NEGATIVE Final    Comment:        The GeneXpert MRSA Assay (FDA approved for NASAL specimens only), is one component of a comprehensive MRSA colonization surveillance program. It is not intended to diagnose MRSA infection nor to guide or monitor treatment for MRSA infections. Performed at [facility], [place] [address] [place], [place] [address]   Respiratory Panel by PCR     Status: None   Collection Time: 06/06/17 12:41 PM  Result Value Ref Range Status   Adenovirus NOT DETECTED NOT DETECTED Final   Coronavirus 229E NOT DETECTED NOT DETECTED Final   Coronavirus HKU1 NOT DETECTED NOT DETECTED Final   Coronavirus NL63 NOT DETECTED NOT DETECTED Final   Coronavirus OC43 NOT DETECTED NOT DETECTED Final   Metapneumovirus NOT DETECTED NOT DETECTED Final   Rhinovirus / Enterovirus NOT DETECTED NOT DETECTED Final   Influenza A NOT DETECTED NOT DETECTED Final   Influenza B NOT DETECTED NOT DETECTED Final   Parainfluenza Virus 1 NOT DETECTED NOT DETECTED Final   Parainfluenza Virus 2 NOT DETECTED NOT DETECTED Final   Parainfluenza Virus 3 NOT DETECTED NOT DETECTED Final   Parainfluenza Virus 4 NOT DETECTED NOT DETECTED Final   Respiratory Syncytial Virus NOT DETECTED NOT DETECTED Final   Bordetella pertussis NOT DETECTED NOT DETECTED Final   Chlamydophila pneumoniae NOT DETECTED NOT DETECTED Final   Mycoplasma pneumoniae NOT DETECTED NOT DETECTED Final    Comment: Performed at [facility], [place] [address], [place], [place] [address]      Labs: BNP (last 3 results) Recent Labs    06/05/17 0846  BNP >8,250.5*   Basic Metabolic Panel: Recent Labs  Lab 06/09/17 0230 06/10/17 0244 06/11/17 0230 06/12/17 0254 06/13/17 0437  NA 139 139 139 140  141 140  K 3.5 3.8 3.9 4.2  4.2 4.2  CL 109 108 108 107  107 110  CO2 20* 19* 22 22  23  21*  GLUCOSE 103* 98 107* 90  88 95  BUN 56* 59* 58* 54*  54* 53*  CREATININE 3.85* 3.97* 3.76* 4.01*  4.05* 3.81*  CALCIUM 7.6*  7.6* 7.6* 7.9* 8.0*  8.0* 8.0*  PHOS 4.5 4.9* 4.8* 5.0* 4.6   Liver Function Tests: Recent Labs  Lab 06/09/17 0230 06/10/17 0244 06/11/17 0230 06/12/17 0254 06/13/17 0437  ALBUMIN 2.0* 2.1* 2.1* 2.1* 2.2*   No results for input(s): LIPASE, AMYLASE in the last 168 hours. No results for input(s): AMMONIA in the last 168 hours. CBC: Recent Labs  Lab 06/08/17 0150  06/09/17 0230 06/12/17 0254 06/12/17 1839 06/13/17 0437 06/13/17 0923  WBC 10.5   < > 8.7 8.6 9.4 10.1 10.9*  NEUTROABS 8.9*  --  6.3 6.1 6.7 7.3  --   HGB 6.6*   < > 7.6* 7.0* 9.3* 8.1* 9.7*  HCT 20.6*   < > 22.8* 21.5* 28.4* 25.0* 30.1*  MCV 69.1*   < > 72.8* 74.4* 76.5* 76.5* 77.2*  PLT 359   < > 332 305 364 315 330   < > = values in this interval not displayed.   Cardiac Enzymes: No results for input(s): CKTOTAL, CKMB, CKMBINDEX, TROPONINI in the last 168 hours. BNP: Invalid input(s): POCBNP CBG: No results for input(s): GLUCAP in the last 168 hours. D-Dimer No results for input(s): DDIMER in the last 72 hours. Hgb A1c No results for input(s): HGBA1C in the last 72 hours. Lipid Profile No results for input(s): CHOL, HDL, LDLCALC, TRIG, CHOLHDL, LDLDIRECT in the last 72 hours. Thyroid function studies No results for input(s): TSH, T4TOTAL, T3FREE, THYROIDAB in the last 72 hours.  Invalid input(s): FREET3 Anemia work up No results for input(s): VITAMINB12, FOLATE, FERRITIN, TIBC, IRON, RETICCTPCT in the last 72 hours. Urinalysis    Component Value Date/Time    COLORURINE YELLOW 06/08/2017 1602   APPEARANCEUR CLEAR 06/08/2017 1602   LABSPEC 1.010 06/08/2017 1602   PHURINE 5.0 06/08/2017 1602   GLUCOSEU NEGATIVE 06/08/2017 1602   GLUCOSEU NEGATIVE 07/06/2013 1105   HGBUR SMALL (A) 06/08/2017 1602   BILIRUBINUR NEGATIVE 06/08/2017 1602   KETONESUR NEGATIVE 06/08/2017 1602   PROTEINUR 100 (A) 06/08/2017 1602   UROBILINOGEN 0.2 07/06/2013 1105   NITRITE NEGATIVE 06/08/2017 1602   LEUKOCYTESUR TRACE (A) 06/08/2017 1602   Sepsis Labs Invalid input(s): PROCALCITONIN,  WBC,  LACTICIDVEN Microbiology Recent Results (from the past 240 hour(s))  MRSA PCR Screening     Status: None   Collection Time: 06/05/17  6:58 PM  Result Value Ref Range Status   MRSA by PCR NEGATIVE NEGATIVE Final    Comment:        The GeneXpert MRSA Assay (FDA approved for NASAL specimens only), is one component of a comprehensive MRSA colonization surveillance program. It is not intended to diagnose MRSA infection nor to guide or monitor treatment for MRSA infections. Performed at [facility], [place] [address] [place], [place] [address]   Respiratory Panel by PCR     Status: None   Collection Time: 06/06/17 12:41 PM  Result Value Ref Range Status   Adenovirus NOT DETECTED NOT DETECTED Final   Coronavirus 229E NOT DETECTED NOT DETECTED Final   Coronavirus HKU1 NOT DETECTED NOT DETECTED Final   Coronavirus NL63 NOT DETECTED NOT DETECTED Final   Coronavirus OC43 NOT DETECTED NOT DETECTED Final   Metapneumovirus NOT DETECTED NOT DETECTED Final   Rhinovirus / Enterovirus NOT DETECTED NOT DETECTED Final   Influenza A NOT DETECTED NOT DETECTED Final   Influenza B NOT DETECTED NOT DETECTED Final   Parainfluenza Virus 1 NOT DETECTED NOT DETECTED Final   Parainfluenza Virus 2 NOT DETECTED NOT DETECTED Final   Parainfluenza Virus  3 NOT DETECTED NOT DETECTED Final   Parainfluenza Virus 4 NOT DETECTED NOT DETECTED Final   Respiratory Syncytial Virus NOT DETECTED  NOT DETECTED Final   Bordetella pertussis NOT DETECTED NOT DETECTED Final   Chlamydophila pneumoniae NOT DETECTED NOT DETECTED Final   Mycoplasma pneumoniae NOT DETECTED NOT DETECTED Final    Comment: Performed at [facility], [place] [address] [place], [place] [address]     Time coordinating discharge: Over 34 minutes  SIGNED:   Georgette Shell, MD  [facility] 06/13/2017, 2:58 PM Pager   If 7PM-7AM, please contact night-coverage [email or web address] Password [email or web address]

## 2017-06-13 NOTE — Progress Notes (Signed)
Discharge instructions gone over with patient and patient's sister in law, both stated that they understand.  All questions answered. Patient is ready for discharge.  Diana Walters [facility] 06/13/2017 4:32 PM

## 2017-06-13 NOTE — Consult Note (Signed)
[facility] CM Primary Care Navigator  06/13/2017  Diana Walters 06/19/1955 [id number]   Met with patient at the bedside to identify possible discharge needs. Patient reports being admitted with shortness of breath/ difficulty breathing and gradually worsening lower extremity edema that resulted to this admission.  Patient endorsesDr.Sean Loanne Drilling with [facility] as the primary care provider.   [facility] [address] to obtain medicationswith difficulty to afford (since she is financially helping out her grandchild) most medications that caused her to be non-compliant for almost a year and likely resulting to this admission.  She mentioned that she use to manage her own medications at home with use of "pill box" system filled weekly.  Patient verbalized that she has been driving prior to admission but her friend Judson Roch) and sister in-law Mateo Flow) can provide transportation to her doctors'appointments after discharge.  Patient states that her grandson lives with her at home, who can be of assistance with care when needed. Her daughter will also be helpful in assisting with care needs per patient.   Anticipated plan for discharge is home according to patient.   Patientexpressedunderstanding to call primary care provider officeafter dischargeto schedule a posthospitalfollow-up visit within 1- 2 weeksor sooner if needs arise.Patient letter(with PCP's contact number) wasgiven as a reminder.  Patienthas history of hypertension, thoracic aortic aneurysm status post stent repair at [facility] in 2015with small contained leak and a history of CVA.  Patient was admitted with new systolic congestive heart failure with BNP >4500, acute pulmonary edema and worsening renal failure. EF is 35-40%  by echocardiography.  Patient mentioned not havingaweighing scale to monitor weight at home. Patienthas minimal knowledge  ofsigns and symptomsofHFthatwill need medical assistance. She is not awarereportable weight gains, HF zones/ tool and ways to manage heart failure but aware to follow-up with provider when needed. She mentioned not taking medications at home due to affordability issues.  Explained to patient about [facility] care management services/ resources available for herand was interested of it, but she states preferring phone calls over home visits.She verbally agreed and opted for referralto [facility] Telephoniccare managementcoordinator after dischargeforfurther assessment of needsand provide information/ education on ways to manage HF.  Referral made to THNTelephoniccare managementcoordinator forfollow-upof needs in managing new HF, provide education/ informationand reinforce disease management ofheart failure at home; and referral to [place] for medication assistance and medication adherence.  THNcare managementinformation provided for future needs that may arise.  Patient's RN was notified of patient's concern regarding obtaining/ affording medications from pharmacy upon discharge and Inpatient CM was contacted.  For additional questions please contact:  Edwena Felty A. Lux Skilton, BSN, RN-BC [facility] PRIMARY CARE Navigator Cell: [phone]

## 2017-06-13 NOTE — Consult Note (Addendum)
Consultation  Referring Provider: Dr. Zigmund Daniel     Primary Care Physician:  Renato Shin, MD Primary Gastroenterologist: Dr. Ardis Hughs         Reason for Consultation: Anemia, GI bleed         Impression / Plan:   Impression: 1.  Acute on chronic Anemia: Hemoglobin on admission 7.7-->Decreased to 6.6 during admission, given 1 u prbcs and IV iron as well Aranesp-->yesterday 9.3 after 1 further unit PRBCs back down to 8.1 this morning with no signs of GI bleeding, fecal occult negative on 06/05/2017 2. Acute systolic CHF with elevated troponin: Cardiology has now signed off as patient is stable 3. Acute Respiratory failure: resolved, thought from acute systolic CHD-currently on Azithromycin per pulmonology 4. Acute on Chronic stage III kidney disease  Plan: 1. With anemia and no recent endoscopic work up, will discuss ? EGD/Colonoscopy  with Dr. Carlean Purl. 2. Continue supportive measures including monitoring of hgb with transfusion as needed <7. Will recheck hgb now. 3. Please await final recommendations from Dr. Carlean Purl later today  Thank you for your kind consultation, we will continue to follow.  Lavone Nian [facility]  06/13/2017, 9:44 AM Pager #: [phone]    [facility] Attending   I have taken an interval history, reviewed the chart and examined the patient. I agree with the Advanced Practitioner's note, impression and recommendations.   Decreased Hgb in setting of chronic anemia Based upon all available evidence I do not think she needs a GI endoscopic w/u now.  Heme negative anemia - likely chronic disease w/ CKD/CHF and anemia of acute illness also B12, Folate ferritin ok late 2018 and ferritin also ok now IBC panel has been low sat and low TIBC  I think ok to resume Plavix  I would have her see a hematologist as outpatient   If persistent ? Can return to GI   Thanks  Have rechecked Hgb but again was heme neg yesterday   Gatha Mayer, MD, [facility] 06/13/2017 12:29 PM         HPI:   Diana Walters is a 62 y.o. female with a past medical history of hypertension, thoracic aortic aneurysm status post stent graft repair at [facility] in 2015 with small contained leak, history of CVA on Plavix (recent echo with a EF 35% and severe tricuspid regurg) and others listed below, who was admitted by the critical care team on 06/05/2017 for treatment of respiratory distress/acute respiratory failure, acute renal failure.  Cardiology has also been consulted in regards to an elevated troponin on admission.  We were consulted today in regards to anemia and reports of GI bleeding prior to admission.    Today, explains that she has been anemic since she was 62 years old with a previous work-up, but none recently.  Describes dark stool just prior to admission which looked like "the inside of a grapefruit, thin and straggly and stuff".  Was also having trouble producing a bowel movement.  Since being in the hospital has been having normal stools and not seeing any further darkness or bright red blood.  Has not been on iron recently but has been in the past for her anemia.    Denies fever, chills, weight loss, abdominal pain, nausea, vomiting, heartburn or reflux.  Hospital Course (per hospitalist note) 06/05/17: Admitted by critical care team to the ICU. Dopplers negative for DVT. Placed on BiPAP. NG tube placed. Bradycardic into the 30s. Troponins elevated. Cardiology consulted  and advised conservative care for now. Hyperkalemic. KUB ordered to evaluate abdominal pain: Negative for acute process. Lasix given for respiratory distress. 06/06/17: Weaned to nasal cannula. Placed on azithromycin for community-acquired pneumonia. Respiratory virus panel negative. Lasix increased by cardiology for acute systolic CHF. EF 35-40 percent on Echo, which was a change from her prior echo. Transferred tothe SDU. 06/07/17: Nephrology consultation requested for creatinine  elevation to 4.12. 06/08/17: Hemoglobin 6.6. Received PRBCs. Remains bradycardic at times with heart rate into the 30s. Beta blocker held. 06/09/17: Started on low-dose Toprol by cardiology. Hemoglobin 7.6. 06/10/17: [facility] assumed care of patient.  Previous GI history: 09/26/2013 Colonoscopy Dr. Ardis Hughs: Normal colon with no polyps or cancers, repeat recommended in 10 years. 01/29/2004 EGD and colonoscopy Dr. Sharlett Iles: Done for anemia with low ferritin, low iron saturation; duodenitis without hemorrhage no celiac disease on duodenal bx 11/14/2000 EGD Dr. Deatra Ina: Done for anemia with low ferritin, normal exam  Past Medical History:  Diagnosis Date  . ANEMIA 10/17/2009  . ANXIETY 10/28/2006  . Arthritis   . ASTHMA 10/28/2006   no inhalers per pt  . Blood transfusion without reported diagnosis   . Depression   . Duodenitis without mention of hemorrhage 2005  . Heart murmur   . HYPERCHOLESTEROLEMIA 04/01/2008  . HYPERTENSION 10/28/2006  . Menopause    age 74  . Osteoporosis    bil knees  . SEIZURE DISORDER 04/01/2008  . Stroke ([place]) 11/2012  . 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 ESOPHAGOGASTRODUODENOSCOPY  01/29/2004  . 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 LOOP RECORDER IMPLANT N/A 12/01/2012   Procedure: LOOP RECORDER IMPLANT;  Surgeon: Coralyn Mark, MD;  Location: [facility]ATH [facility];  Service: Cardiovascular;  Laterality: N/A;  . TEE WITHOUT CARDIOVERSION N/A 12/01/2012   Procedure: TRANSESOPHAGEAL ECHOCARDIOGRAM (TEE);  Surgeon: Fay Records, MD;  Location: [facility] ENDOSCOPY;  Service: Cardiovascular;  Laterality: N/A;    Family History  Problem Relation Age of Onset  . Breast cancer Mother   . Heart disease Mother        before age 49  . Colon cancer Neg Hx   . Esophageal cancer Neg Hx   . Pancreatic cancer Neg Hx   . Rectal cancer Neg Hx   . Stomach cancer Neg Hx      Social  History   Tobacco Use  . Smoking status: Current Every Day Smoker    Packs/day: 0.50    Years: 12.00    Pack years: 6.00    Types: Cigarettes  . Smokeless tobacco: Never Used  Substance Use Topics  . Alcohol use: Yes    Alcohol/week: 0.0 oz  . Drug use: Yes    Types: Marijuana    Prior to Admission medications   Medication Sig Start Date End Date Taking? Authorizing Provider  amLODipine (NORVASC) 10 MG tablet Take 1 tablet (10 mg total) by mouth daily. 03/30/16   Fay Records, MD  aspirin EC 81 MG tablet Take 81 mg by mouth daily.    [provider]  atorvastatin (LIPITOR) 40 MG tablet TAKE 1 TABLET BY MOUTH EVERY DAY 07/19/16   Fay Records, MD  diphenhydrAMINE (BENADRYL) 25 mg capsule Take 25 mg by mouth daily as needed for allergies (TAKES THE DAY OF INFUSION).    [provider]  ferrous sulfate 325 (65 FE) MG tablet Take 1 tablet (  325 mg total) daily with breakfast by mouth. 12/29/16   Fay Records, MD  hydrALAZINE (APRESOLINE) 50 MG tablet Take 1.5 tablets (75 mg total) by mouth 3 (three) times daily. 10/22/16   Bhagat, Crista Luria, PA  InFLIXimab (REMICADE IV) Inject into the vein. At rheumatology    [provider]  levETIRAcetam (KEPPRA) 1000 MG tablet Take 1 tablet (1,000 mg total) by mouth 2 (two) times daily. 06/07/16   Dennie Bible, NP  methotrexate 2.5 MG tablet Take 7.5 mg by mouth once a week. Friday    [provider]  metoprolol succinate (TOPROL-XL) 25 MG 24 hr tablet Take 1 tablet (25 mg total) by mouth daily. Take with or immediately following a meal. 09/14/16   Carlisle Cater, PA-C  Multiple Vitamin (MULTIVITAMIN) tablet Take 1 tablet by mouth daily.      [provider]  triamterene-hydrochlorothiazide (MAXZIDE-25) 37.5-25 MG tablet Take 0.5 tablets by mouth daily.    [provider]    Current Facility-Administered Medications  Medication Dose Route Frequency Provider Last Rate Last Dose  . 0.9 %   sodium chloride infusion  250 mL Intravenous PRN Rama, Christina P, MD      . 0.9 %  sodium chloride infusion   Intravenous Once Rama, Venetia Maxon, MD      . 0.9 %  sodium chloride infusion   Intravenous Once Georgette Shell, MD      . acetaminophen (TYLENOL) tablet 650 mg  650 mg Oral Q4H PRN Rama, Venetia Maxon, MD      . amLODipine (NORVASC) tablet 5 mg  5 mg Oral BID Rama, Venetia Maxon, MD   5 mg at 06/13/17 0910  . atorvastatin (LIPITOR) tablet 40 mg  40 mg Oral q1800 Rama, Venetia Maxon, MD   40 mg at 06/11/17 1757  . furosemide (LASIX) tablet 40 mg  40 mg Oral Daily Rama, Venetia Maxon, MD   40 mg at 06/13/17 0910  . hydrALAZINE (APRESOLINE) injection 10-40 mg  10-40 mg Intravenous Q4H PRN Rama, Venetia Maxon, MD   40 mg at 06/06/17 0913  . hydrALAZINE (APRESOLINE) tablet 75 mg  75 mg Oral Q8H Rama, Venetia Maxon, MD   75 mg at 06/13/17 0537  . isosorbide mononitrate (IMDUR) 24 hr tablet 60 mg  60 mg Oral Daily Rama, Venetia Maxon, MD   60 mg at 06/13/17 0910  . Living Better with Heart Failure Book   Does not apply Once Georgette Shell, MD      . LORazepam (ATIVAN) injection 0.5-1 mg  0.5-1 mg Intravenous Q4H PRN Rama, Venetia Maxon, MD   0.5 mg at 06/07/17 0026  . metoprolol succinate (TOPROL-XL) 24 hr tablet 25 mg  25 mg Oral Daily Pixie Casino, MD   25 mg at 06/13/17 0913  . metoprolol tartrate (LOPRESSOR) tablet 12.5 mg  12.5 mg Oral BID Pixie Casino, MD   12.5 mg at 06/12/17 2142  . ondansetron (ZOFRAN) injection 4 mg  4 mg Intravenous Q6H PRN Rama, Venetia Maxon, MD      . pantoprazole (PROTONIX) EC tablet 40 mg  40 mg Oral Q1200 Rama, Venetia Maxon, MD   40 mg at 06/12/17 1200    Allergies as of 06/05/2017 - Review Complete 06/05/2017  Allergen Reaction Noted  . Codeine Other (See Comments) 06/05/2017  . Ibuprofen  06/05/2017     Review of Systems:    Constitutional: No weight loss, fever or chills Skin: No rash  Cardiovascular: No  chest pain Respiratory: No SOB    Gastrointestinal: See HPI and otherwise negative Genitourinary: No dysuria Neurological: No headache Musculoskeletal: No new muscle or joint pain Hematologic: No bruising Psychiatric: No history of depression or anxiety   Physical Exam:  Vital signs in last 24 hours: Temp:  [97.8 F (36.6 C)-98.9 F (37.2 C)] 98.5 F (36.9 C) (04/29 0717) Pulse Rate:  [62-74] 66 (04/28 2142) Resp:  [13-18] 18 (04/29 0717) BP: (105-143)/(50-60) 123/52 (04/29 0717) SpO2:  [97 %-100 %] 97 % (04/29 0717) Weight:  [150 lb 6.4 oz (68.2 kg)] 150 lb 6.4 oz (68.2 kg) (04/29 0417) Last BM Date: 06/12/17 General:   Pleasant AA female appears to be in NAD, Well developed, Well nourished, alert and cooperative Head:  Normocephalic and atraumatic. Eyes:   PEERL, EOMI. No icterus. Conjunctiva pink. Ears:  Normal auditory acuity. Neck:  Supple Throat: Oral cavity and pharynx without inflammation, swelling or lesion.  Lungs: Respirations even and unlabored. Lungs clear to auscultation bilaterally.   No wheezes, crackles, or rhonchi.  Heart: Normal S1, S2. No MRG. Regular rate and rhythm. No peripheral edema, cyanosis or pallor.  Abdomen:  Soft, nondistended, nontender. No rebound or guarding. Normal bowel sounds. No appreciable masses or hepatomegaly. Rectal:  Not performed.  Msk:  Symmetrical without gross deformities.  Extremities:  Without edema, no deformity or joint abnormality.  Neurologic:  Alert and  oriented x4;  grossly normal neurologically.  Skin:   Dry and intact without significant lesions or rashes. Psychiatric:  Demonstrates good judgement and reason without abnormal affect or behaviors.  LAB RESULTS: Recent Labs    06/12/17 0254 06/12/17 1839 06/13/17 0437  WBC 8.6 9.4 10.1  HGB 7.0* 9.3* 8.1*  HCT 21.5* 28.4* 25.0*  PLT 305 364 315   BMET Recent Labs    06/11/17 0230 06/12/17 0254 06/13/17 0437  NA 139 140  141 140  K 3.9 4.2  4.2 4.2  CL 108 107  107 110  CO2 22 22  23   21*  GLUCOSE 107* 90  88 95  BUN 58* 54*  54* 53*  CREATININE 3.76* 4.01*  4.05* 3.81*  CALCIUM 7.9* 8.0*  8.0* 8.0*   LFT Recent Labs    06/13/17 0437  ALBUMIN 2.2*   Lab Results  Component Value Date   FERRITIN 299 06/09/2017   Lab Results  Component Value Date   VITAMINB12 605 12/27/2016

## 2017-06-13 NOTE — Progress Notes (Signed)
Md aware of pt's hgb 8.1 this am. Yesterday after 1 unit of PRBC was 9.3.  No bleeding noted.  Will continue to monitor. Saunders Revel T

## 2017-06-15 ENCOUNTER — Other Ambulatory Visit: Payer: Self-pay | Admitting: Pharmacist

## 2017-06-15 NOTE — Patient Outreach (Signed)
[facility]) Care Management  06/15/2017  Diana Walters September 03, 1955 [id number]  62 year old female referred to [facility] by [facility] inpatient liaison for transition of care services.  [facility] services requested for medication assistance.  PMHx includes, but not limited to, HTN, HLD, asthma, COPD, thoracic aortic aneurysm s/p stent graft repair '15, hx CVA, RA, chronic systolic heart failure (EF 35-40%), and chronic kidney disease stage 3 with recent hospitalization 06/05/17 - 06/13/17 for ARF, anemia s/p 2 units PRBCs, and acute respiratory failure 2/2 acute decompensated heart failure.    Unsuccessful call to [name] today.  I left a HIPAA compliant voicemail on her home phone.    Plan: I will send patient an outreach letter describing [facility] services and attempt to contact her again in 3-4 business days.   Ralene Bathe, PharmD, [place] [phone]

## 2017-06-20 ENCOUNTER — Other Ambulatory Visit: Payer: Self-pay | Admitting: Pharmacist

## 2017-06-20 ENCOUNTER — Ambulatory Visit: Payer: Self-pay | Admitting: Pharmacist

## 2017-06-20 ENCOUNTER — Other Ambulatory Visit: Payer: Self-pay

## 2017-06-20 NOTE — Progress Notes (Signed)
Cardiology Office Note:    Date:  06/22/2017   ID:  Diana Walters, DOB [birth year], MRN [id number]  PCP:  Renato Shin, MD  Cardiologist:  Dorris Carnes, MD  Referring MD: Renato Shin, MD   Chief Complaint  Patient presents with  . Hospitalization Follow-up    pt denies any chest pains, SOB, pt does mention having swelling in feet    History of Present Illness:    Diana Walters is a 62 y.o. female with a past medical history significant for hypertension, thoracic aneurysm (s/p stent repair 2015 with small contained leak), CVA, stage III CKD.  [name] was admitted to the hospital for respiratory distress/acute respiratory failure and treated for acute systolic CHF and acute renal failure. She was placed on BiPap and noted to have bradycardia in the 30's and elevated troponins with peak of 0.21, flat pattrern.  CXR showed cardiomegaly with diffuse bilateral pulmonry infiltrates or edema. An echo showed EF 35-40% which was down from previous. She was diuresed with lasix IV. Nephrology was consulted for creatinine elevation to 4.12, thought to be hemodynamically mediated. Hemoglobin was 6.6 and she was transfused with 2 U PRBCs and 1 U Feraheme. She will need outpatient hematology follow up.  While hospitalized she was switched from Toprol XL to metoprolol but advised to switch back to Toprol XL, bisoprolol or carvedilol in setting of heart failure so she was switched to Toprol XL 25 mg daily. Will need to watch for bradycardia. Wt at discharge was 150 lbs.   She is here today alone for follow up. She says that her issues had started after she had not been taking her medications due to inability to afford them. She says that she now has her meds and is taking them although she did not take them this am as she did not want to be needing to use the bathroom during her appointment. She does not have her meds with her and thinks she may be missing some. I had her call home to have her son read off  what she is taking form the bottles. Apparently she is only taking 3 meds- amlodipine, Imdur and lasix. She does not have any other medications. She said that the medications were not there to pick up at the pharmacy.   She still had LE edema, but she says this has improved since her discharge. Her breathing is at her baseline. She has no shortness of breath going up her 3 steps at home, much better than prior to her hospitalization. She denies orthopnea, PND, palpitations or lightheadeness. No chest pain/pressure/tightness. She says she never had chest pain.   She continues to smoke but is cutting down. She has had the same pack of cigarettes for the last 6 days. She has confidence that she can stop as she did not crave smoking while in the hospital.   Past Medical History:  Diagnosis Date  . ANEMIA 10/17/2009  . ANXIETY 10/28/2006  . Arthritis   . ASTHMA 10/28/2006   no inhalers per pt  . Blood transfusion without reported diagnosis   . Depression   . Duodenitis without mention of hemorrhage 2005  . Heart murmur   . HYPERCHOLESTEROLEMIA 04/01/2008  . HYPERTENSION 10/28/2006  . Menopause    age 89  . Osteoporosis    bil knees  . SEIZURE DISORDER 04/01/2008  . Stroke ([place]) 11/2012  . 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  ESOPHAGOGASTRODUODENOSCOPY  01/29/2004  . 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 LOOP RECORDER IMPLANT N/A 12/01/2012   Procedure: LOOP RECORDER IMPLANT;  Surgeon: Coralyn Mark, MD;  Location: [facility];  Service: Cardiovascular;  Laterality: N/A;  . TEE WITHOUT CARDIOVERSION N/A 12/01/2012   Procedure: TRANSESOPHAGEAL ECHOCARDIOGRAM (TEE);  Surgeon: Fay Records, MD;  Location: [facility] ENDOSCOPY;  Service: Cardiovascular;  Laterality: N/A;    Current Medications: Current Meds  Medication Sig  . aspirin EC 81 MG tablet Take 1 tablet (81 mg total) by mouth  daily.  Marland Kitchen atorvastatin (LIPITOR) 40 MG tablet Take 1 tablet (40 mg total) by mouth daily.  . ferrous sulfate 325 (65 FE) MG tablet Take 1 tablet (325 mg total) daily with breakfast by mouth.  . furosemide (LASIX) 40 MG tablet Take 1 tablet (40 mg total) by mouth daily.  . hydrALAZINE (APRESOLINE) 50 MG tablet Take 1.5 tablets (75 mg total) by mouth 3 (three) times daily.  . isosorbide mononitrate (IMDUR) 60 MG 24 hr tablet Take 1 tablet (60 mg total) by mouth daily.  . methotrexate 2.5 MG tablet Take 7.5 mg by mouth once a week. Friday  . metoprolol succinate (TOPROL-XL) 25 MG 24 hr tablet Take 1 tablet (25 mg total) by mouth daily. Take with or immediately following a meal.  . Multiple Vitamin (MULTIVITAMIN) tablet Take 1 tablet by mouth daily.    . [DISCONTINUED] amLODipine (NORVASC) 5 MG tablet Take 1 tablet (5 mg total) by mouth 2 (two) times daily.  . [DISCONTINUED] aspirin EC 81 MG tablet Take 81 mg by mouth daily.  . [DISCONTINUED] atorvastatin (LIPITOR) 40 MG tablet TAKE 1 TABLET BY MOUTH EVERY DAY  . [DISCONTINUED] furosemide (LASIX) 40 MG tablet Take 1 tablet (40 mg total) by mouth daily.  . [DISCONTINUED] hydrALAZINE (APRESOLINE) 50 MG tablet Take 1.5 tablets (75 mg total) by mouth 3 (three) times daily.  . [DISCONTINUED] isosorbide mononitrate (IMDUR) 60 MG 24 hr tablet Take 1 tablet (60 mg total) by mouth daily.  . [DISCONTINUED] metoprolol succinate (TOPROL-XL) 25 MG 24 hr tablet Take 1 tablet (25 mg total) by mouth daily. Take with or immediately following a meal.     Allergies:   Codeine and Ibuprofen   Social History   Socioeconomic History  . Marital status: Single    Spouse name: Not on file  . Number of children: Not on file  . Years of education: Not on file  . Highest education level: Not on file  Occupational History  . Not on file  Social Needs  . Financial resource strain: Not on file  . Food insecurity:    Worry: Not on file    Inability: Not on file  .  Transportation needs:    Medical: Not on file    Non-medical: Not on file  Tobacco Use  . Smoking status: Current Every Day Smoker    Packs/day: 0.50    Years: 12.00    Pack years: 6.00    Types: Cigarettes  . Smokeless tobacco: Never Used  Substance and Sexual Activity  . Alcohol use: Yes    Alcohol/week: 0.0 oz  . Drug use: Yes    Types: Marijuana  . Sexual activity: Not on file  Lifestyle  . Physical activity:    Days per week: Not on file    Minutes per session: Not on file  . Stress: Not on file  Relationships  .  Social connections:    Talks on phone: Not on file    Gets together: Not on file    Attends religious service: Not on file    Active member of club or organization: Not on file    Attends meetings of clubs or organizations: Not on file    Relationship status: Not on file  Other Topics Concern  . Not on file  Social History Narrative  . Not on file     Family History: The patient's family history includes Breast cancer in her mother; Heart disease in her mother. There is no history of Colon cancer, Esophageal cancer, Pancreatic cancer, Rectal cancer, or Stomach cancer. ROS:   Please see the history of present illness.     All other systems reviewed and are negative.  EKGs/Labs/Other Studies Reviewed:    The following studies were reviewed today:  Echocardiogram 06/05/17 Study Conclusions - Left ventricle: Inferior , septal and apical hypokinesis The   cavity size was moderately dilated. Wall thickness was increased   in a pattern of mild LVH. Systolic function was moderately   reduced. The estimated ejection fraction was in the range of 35%   to 40%. Doppler parameters are consistent with both elevated   ventricular end-diastolic filling pressure and elevated left   atrial filling pressure. - Aortic valve: Calcified right coronary cusp. There was mild   regurgitation. - Mitral valve: Severe posterior annular calcification. There was   mild  regurgitation. - Left atrium: The atrium was mildly dilated. - Atrial septum: No defect or patent foramen ovale was identified. - Tricuspid valve: There was moderate-severe regurgitation.  EKG:  EKG is not ordered today.   Recent Labs: 06/05/2017: ALT 14; B Natriuretic Peptide >4,500.0 06/06/2017: Magnesium 2.1 06/07/2017: TSH 0.587 06/13/2017: BUN 53; Creatinine, Ser 3.81; Hemoglobin 9.7; Platelets 330; Potassium 4.2; Sodium 140   Recent Lipid Panel    Component Value Date/Time   CHOL 162 05/18/2016 0859   TRIG 97 05/18/2016 0859   HDL 48 05/18/2016 0859   CHOLHDL 3.4 05/18/2016 0859   CHOLHDL 3 12/20/2013 1016   VLDL 20.2 12/20/2013 1016   LDLCALC 95 05/18/2016 0859   LDLDIRECT 74.3 11/27/2010 1058    Physical Exam:    VS:  BP 112/78 (BP Location: Left Arm)   Pulse 84   Ht 5\' 10"  (1.778 m)   Wt 145 lb 12 oz (66.1 kg)   BMI 20.91 kg/m     Wt Readings from Last 3 Encounters:  06/22/17 145 lb 12 oz (66.1 kg)  06/13/17 150 lb 6.4 oz (68.2 kg)  12/20/16 158 lb 6.4 oz (71.8 kg)     Physical Exam  Constitutional: She is oriented to person, place, and time.  Elderly female. Walking very gingerly due to bilateral knee, hip and back pain.  HENT:  Head: Normocephalic and atraumatic.  Neck: Normal range of motion. Neck supple. JVD present.  Cardiovascular: Normal rate and regular rhythm.  Murmur heard.  Harsh holosystolic murmur is present with a grade of 2/6 at the upper right sternal border radiating to the neck. Pulmonary/Chest: Effort normal and breath sounds normal. No respiratory distress.  Course expiratory wheezes and scattered course rhonchi  Abdominal: Soft. Bowel sounds are normal. She exhibits no distension.  Musculoskeletal: She exhibits edema.  1+ bilateral ankle/pedal edema  Neurological: She is alert and oriented to person, place, and time.  Skin: Skin is warm and dry.  Psychiatric: She has a normal mood and affect. Her behavior is normal.  Thought content  normal.    ASSESSMENT:    1. Acute systolic CHF (congestive heart failure) ([place])   2. Acute renal failure superimposed on stage 3 chronic kidney disease, unspecified acute renal failure type ([place])   3. Anemia of chronic disease   4. Tobacco abuse   5. Other hyperlipidemia    PLAN:    In order of problems listed above:  Acute Systolic CHF: recent hospitalization for acute respiratory failure with acute systolic CHF and acute renal failure. Echo showed EF 35-40%, down from 55-60% in 12/2016. She was diuresed. She was discharged on toprol XL 25 mg daily, hydralazine 75 mg TID, Imdur 60 mg daily and lasix 40 mg daily but pt has not been taking the hydralazine, aspirin, atorvastatin, or toprol. She is only taking amlodipine 5 mg bid, lasix 40 mg daiy and Imdur 60 mg daily. She still appears to be volume overloaded with JVD and LE edema, but she denies dyspnea or orthopnea and says that she is back to her normal functioning. Her wt is down 5 lbs since discharge. Her BP is soft and she has not taken her meds today. I will stop her amlodipine and have her start taking the meds that she was missing- hydralazine, toprol, aspirin and atorvastatin. I discussed daily wts and what to report, low sodium diet and monitoring her breathing and swelling and report increase. The nurse also went over heart failure teaching in-depth and provided handout. She plans to buy a scale. Plan for close follow up in the office.   Stage III CKD: baseline Scr 1.8-2.0. Creatinine elevated in hospital to 4.12. SCr 3.81 on discharge. To follow up with nephrology, has appointment next week. Will check Bmet today.   Anemia: Of chronic disease. Hgb down to 6.6, requiring transfusion. Hgb 9.7 on discharge. Plans for hematology follow up. Will check CBC.   Tobacco abuse: continues to smoke but trying to cut down. Is currently smoking 2-5 cigarettes per day and has confidence that she can stop completely. I strongly advised her on  cessation.   Hyperlipidemia: Last LDL was 95 in 05/2016. Atorvastatin was started in the hospital. Will need FLP and AST in 4-6 weeks.   Medication Adjustments/Labs and Tests Ordered: Current medicines are reviewed at length with the patient today.  Concerns regarding medicines are outlined above. Labs and tests ordered and medication changes are outlined in the patient instructions below:  Patient Instructions  Medication Instructions:  Your physician has recommended you make the following change in your medication:   1. STOP: amlodipine  2. TAKE: Aspirin 81 mg tablet: Take 1 tablet daily  3. TAKE: atorvastatin 40 mg tablet: Take 1 tablet daily  4. TAKE: furosemide 40 mg tablet:Take 1 tablet daily  5. TAKE: hydralazine 50 mg tablet: Take 1.5 tablets (75 mg total) THREE times daily  6. TAKE: isosorbide mononitrate 60 mg tablet: Take 1 tablet daily  7. TAKE: metoprolol succinate 25 mg tablet: Take 1 tablet daily  Labwork: TODAY: CBC, BMET  Testing/Procedures: None ordered  Follow-Up: Your physician recommends that you have a follow-up appointment on 07/05/17 at 12:00 PM with Pecolia Ades, NP   Any Other Special Instructions Will Be Listed Below (If Applicable).  KEEP A RECORD OF YOUR WEIGHT AND WEIGH YOURSELF DAILY AT THE SAME TIME WITH THE SAME AMOUNT OF CLOTHES ON   Low-Sodium Eating Plan Sodium, which is an element that makes up salt, helps you maintain a healthy balance of fluids in your body. Too much  sodium can increase your blood pressure and cause fluid and waste to be held in your body. Your health care provider or dietitian may recommend following this plan if you have high blood pressure (hypertension), kidney disease, liver disease, or heart failure. Eating less sodium can help lower your blood pressure, reduce swelling, and protect your heart, liver, and kidneys. What are tips for following this plan? General guidelines  Most people on this plan should limit  their sodium intake to 1,500-2,000 mg (milligrams) of sodium each day. Reading food labels  The Nutrition Facts label lists the amount of sodium in one serving of the food. If you eat more than one serving, you must multiply the listed amount of sodium by the number of servings.  Choose foods with less than 140 mg of sodium per serving.  Avoid foods with 300 mg of sodium or more per serving. Shopping  Look for lower-sodium products, often labeled as "low-sodium" or "no salt added."  Always check the sodium content even if foods are labeled as "unsalted" or "no salt added".  Buy fresh foods. ? Avoid canned foods and premade or frozen meals. ? Avoid canned, cured, or processed meats  Buy breads that have less than 80 mg of sodium per slice. Cooking  Eat more home-cooked food and less restaurant, buffet, and fast food.  Avoid adding salt when cooking. Use salt-free seasonings or herbs instead of table salt or sea salt. Check with your health care provider or pharmacist before using salt substitutes.  Cook with plant-based oils, such as canola, sunflower, or olive oil. Meal planning  When eating at a restaurant, ask that your food be prepared with less salt or no salt, if possible.  Avoid foods that contain MSG (monosodium glutamate). MSG is sometimes added to [place] food, bouillon, and some canned foods. What foods are recommended? The items listed may not be a complete list. Talk with your dietitian about what dietary choices are best for you. Grains Low-sodium cereals, including oats, puffed wheat and rice, and shredded wheat. Low-sodium crackers. Unsalted rice. Unsalted pasta. Low-sodium bread. Whole-grain breads and whole-grain pasta. Vegetables Fresh or frozen vegetables. "No salt added" canned vegetables. "No salt added" tomato sauce and paste. Low-sodium or reduced-sodium tomato and vegetable juice. Fruits Fresh, frozen, or canned fruit. Fruit juice. Meats and other  protein foods Fresh or frozen (no salt added) meat, poultry, seafood, and fish. Low-sodium canned tuna and salmon. Unsalted nuts. Dried peas, beans, and lentils without added salt. Unsalted canned beans. Eggs. Unsalted nut butters. Dairy Milk. Soy milk. Cheese that is naturally low in sodium, such as ricotta cheese, fresh mozzarella, or Swiss cheese Low-sodium or reduced-sodium cheese. Cream cheese. Yogurt. Fats and oils Unsalted butter. Unsalted margarine with no trans fat. Vegetable oils such as canola or olive oils. Seasonings and other foods Fresh and dried herbs and spices. Salt-free seasonings. Low-sodium mustard and ketchup. Sodium-free salad dressing. Sodium-free light mayonnaise. Fresh or refrigerated horseradish. Lemon juice. Vinegar. Homemade, reduced-sodium, or low-sodium soups. Unsalted popcorn and pretzels. Low-salt or salt-free chips. What foods are not recommended? The items listed may not be a complete list. Talk with your dietitian about what dietary choices are best for you. Grains Instant hot cereals. Bread stuffing, pancake, and biscuit mixes. Croutons. Seasoned rice or pasta mixes. Noodle soup cups. Boxed or frozen macaroni and cheese. Regular salted crackers. Self-rising flour. Vegetables Sauerkraut, pickled vegetables, and relishes. Olives. [place] fries. Onion rings. Regular canned vegetables (not low-sodium or reduced-sodium). Regular canned tomato sauce  and paste (not low-sodium or reduced-sodium). Regular tomato and vegetable juice (not low-sodium or reduced-sodium). Frozen vegetables in sauces. Meats and other protein foods Meat or fish that is salted, canned, smoked, spiced, or pickled. Bacon, ham, sausage, hotdogs, corned beef, chipped beef, packaged lunch meats, salt pork, jerky, pickled herring, anchovies, regular canned tuna, sardines, salted nuts. Dairy Processed cheese and cheese spreads. Cheese curds. Blue cheese. Feta cheese. String cheese. Regular cottage  cheese. Buttermilk. Canned milk. Fats and oils Salted butter. Regular margarine. Ghee. Bacon fat. Seasonings and other foods Onion salt, garlic salt, seasoned salt, table salt, and sea salt. Canned and packaged gravies. Worcestershire sauce. Tartar sauce. Barbecue sauce. Teriyaki sauce. Soy sauce, including reduced-sodium. Steak sauce. Fish sauce. Oyster sauce. Cocktail sauce. Horseradish that you find on the shelf. Regular ketchup and mustard. Meat flavorings and tenderizers. Bouillon cubes. Hot sauce and Tabasco sauce. Premade or packaged marinades. Premade or packaged taco seasonings. Relishes. Regular salad dressings. Salsa. Potato and tortilla chips. Corn chips and puffs. Salted popcorn and pretzels. Canned or dried soups. Pizza. Frozen entrees and pot pies. Summary  Eating less sodium can help lower your blood pressure, reduce swelling, and protect your heart, liver, and kidneys.  Most people on this plan should limit their sodium intake to 1,500-2,000 mg (milligrams) of sodium each day.  Canned, boxed, and frozen foods are high in sodium. Restaurant foods, fast foods, and pizza are also very high in sodium. You also get sodium by adding salt to food.  Try to cook at home, eat more fresh fruits and vegetables, and eat less fast food, canned, processed, or prepared foods. This information is not intended to replace advice given to you by your health care provider. Make sure you discuss any questions you have with your health care provider. Document Released: 07/24/2001 Document Revised: 01/26/2016 Document Reviewed: 01/26/2016 Elsevier Interactive Patient Education  [employer].    If you need a refill on your cardiac medications before your next appointment, please call your pharmacy.     Signed, Daune Perch, NP  06/22/2017 12:08 PM    [facility]

## 2017-06-20 NOTE — Patient Outreach (Signed)
[place] [facility]) Care Management  06/20/2017  Diana Walters 06/20/1955 [id number]  Transition of care  Referral date: 06/14/17 Referral source: Hospital discharge/ [name], RN  Insurance: Medicare  Telephone call to patient regarding transition of care referral. Unable to reach patient. HIPAA compliant voice message left with call back phone number.    PLAN: RNCM will attempt 2nd telephone outreach to patient within 4 business days.  Outreach letter sent on 06/15/17 by [facility] care management pharmacist, Ralene Bathe.  [name] RN,BSN,CCM [facility] Telephonic  [phone]

## 2017-06-20 NOTE — Patient Outreach (Signed)
[facility] [facility]) Care Management  06/20/2017  Diana Walters May 11, 1955 [id number]  Unsuccessful call attempt #2 to [name] for medication assistance.  I left a HIPAA compliant voicemail on her home / mobile phone.    Plan: I will call [name] again later this week.    Ralene Bathe, PharmD, [place] [phone]

## 2017-06-22 ENCOUNTER — Encounter: Payer: Self-pay | Admitting: Cardiology

## 2017-06-22 ENCOUNTER — Ambulatory Visit (INDEPENDENT_AMBULATORY_CARE_PROVIDER_SITE_OTHER): Payer: Medicare Other | Admitting: Cardiology

## 2017-06-22 VITALS — BP 112/78 | HR 84 | Ht 70.0 in | Wt 145.8 lb

## 2017-06-22 DIAGNOSIS — N183 Chronic kidney disease, stage 3 (moderate): Secondary | ICD-10-CM

## 2017-06-22 DIAGNOSIS — D638 Anemia in other chronic diseases classified elsewhere: Secondary | ICD-10-CM | POA: Diagnosis not present

## 2017-06-22 DIAGNOSIS — I5021 Acute systolic (congestive) heart failure: Secondary | ICD-10-CM | POA: Diagnosis not present

## 2017-06-22 DIAGNOSIS — Z720 Tobacco use: Secondary | ICD-10-CM | POA: Diagnosis not present

## 2017-06-22 DIAGNOSIS — N179 Acute kidney failure, unspecified: Secondary | ICD-10-CM | POA: Diagnosis not present

## 2017-06-22 DIAGNOSIS — E7849 Other hyperlipidemia: Secondary | ICD-10-CM

## 2017-06-22 LAB — BASIC METABOLIC PANEL
BUN / CREAT RATIO: 9 — AB (ref 12–28)
BUN: 31 mg/dL — ABNORMAL HIGH (ref 8–27)
CHLORIDE: 104 mmol/L (ref 96–106)
CO2: 19 mmol/L — ABNORMAL LOW (ref 20–29)
Calcium: 8.2 mg/dL — ABNORMAL LOW (ref 8.7–10.3)
Creatinine, Ser: 3.61 mg/dL — ABNORMAL HIGH (ref 0.57–1.00)
GFR calc non Af Amer: 13 mL/min/{1.73_m2} — ABNORMAL LOW (ref >59)
GFR, EST AFRICAN AMERICAN: 15 mL/min/{1.73_m2} — AB (ref >59)
GLUCOSE: 93 mg/dL (ref 65–99)
Potassium: 4.2 mmol/L (ref 3.5–5.2)
SODIUM: 141 mmol/L (ref 134–144)

## 2017-06-22 LAB — CBC
Hematocrit: 31.8 % — ABNORMAL LOW (ref 34.0–46.6)
Hemoglobin: 10.3 g/dL — ABNORMAL LOW (ref 11.1–15.9)
MCH: 24.8 pg — ABNORMAL LOW (ref 26.6–33.0)
MCHC: 32.4 g/dL (ref 31.5–35.7)
MCV: 77 fL — AB (ref 79–97)
PLATELETS: 304 10*3/uL (ref 150–379)
RBC: 4.15 x10E6/uL (ref 3.77–5.28)
RDW: 21.7 % — AB (ref 12.3–15.4)
WBC: 10.2 10*3/uL (ref 3.4–10.8)

## 2017-06-22 MED ORDER — METOPROLOL SUCCINATE ER 25 MG PO TB24: 25.0000 mg | ORAL_TABLET | Freq: Every day | ORAL | 3 refills | Status: DC

## 2017-06-22 MED ORDER — ASPIRIN EC 81 MG PO TBEC: 81.0000 mg | DELAYED_RELEASE_TABLET | Freq: Every day | ORAL | 3 refills | Status: DC

## 2017-06-22 MED ORDER — FUROSEMIDE 40 MG PO TABS: 40.0000 mg | ORAL_TABLET | Freq: Every day | ORAL | 3 refills | Status: DC

## 2017-06-22 MED ORDER — ATORVASTATIN CALCIUM 40 MG PO TABS: 40.0000 mg | ORAL_TABLET | Freq: Every day | ORAL | 3 refills | Status: AC

## 2017-06-22 MED ORDER — ISOSORBIDE MONONITRATE ER 60 MG PO TB24: 60.0000 mg | ORAL_TABLET | Freq: Every day | ORAL | 3 refills | Status: AC

## 2017-06-22 MED ORDER — HYDRALAZINE HCL 50 MG PO TABS: 75.0000 mg | ORAL_TABLET | Freq: Three times a day (TID) | ORAL | 3 refills | Status: AC

## 2017-06-22 NOTE — Patient Instructions (Addendum)
Medication Instructions:  Your physician has recommended you make the following change in your medication:   1. STOP: amlodipine  2. TAKE: Aspirin 81 mg tablet: Take 1 tablet daily  3. TAKE: atorvastatin 40 mg tablet: Take 1 tablet daily  4. TAKE: furosemide 40 mg tablet:Take 1 tablet daily  5. TAKE: hydralazine 50 mg tablet: Take 1.5 tablets (75 mg total) THREE times daily  6. TAKE: isosorbide mononitrate 60 mg tablet: Take 1 tablet daily  7. TAKE: metoprolol succinate 25 mg tablet: Take 1 tablet daily  Labwork: TODAY: CBC, BMET  Testing/Procedures: None ordered  Follow-Up: Your physician recommends that you have a follow-up appointment on 07/05/17 at 12:00 PM with Pecolia Ades, NP   Any Other Special Instructions Will Be Listed Below (If Applicable).  KEEP A RECORD OF YOUR WEIGHT AND WEIGH YOURSELF DAILY AT THE SAME TIME WITH THE SAME AMOUNT OF CLOTHES ON   Low-Sodium Eating Plan Sodium, which is an element that makes up salt, helps you maintain a healthy balance of fluids in your body. Too much sodium can increase your blood pressure and cause fluid and waste to be held in your body. Your health care provider or dietitian may recommend following this plan if you have high blood pressure (hypertension), kidney disease, liver disease, or heart failure. Eating less sodium can help lower your blood pressure, reduce swelling, and protect your heart, liver, and kidneys. What are tips for following this plan? General guidelines  Most people on this plan should limit their sodium intake to 1,500-2,000 mg (milligrams) of sodium each day. Reading food labels  The Nutrition Facts label lists the amount of sodium in one serving of the food. If you eat more than one serving, you must multiply the listed amount of sodium by the number of servings.  Choose foods with less than 140 mg of sodium per serving.  Avoid foods with 300 mg of sodium or more per serving. Shopping  Look for  lower-sodium products, often labeled as "low-sodium" or "no salt added."  Always check the sodium content even if foods are labeled as "unsalted" or "no salt added".  Buy fresh foods. ? Avoid canned foods and premade or frozen meals. ? Avoid canned, cured, or processed meats  Buy breads that have less than 80 mg of sodium per slice. Cooking  Eat more home-cooked food and less restaurant, buffet, and fast food.  Avoid adding salt when cooking. Use salt-free seasonings or herbs instead of table salt or sea salt. Check with your health care provider or pharmacist before using salt substitutes.  Cook with plant-based oils, such as canola, sunflower, or olive oil. Meal planning  When eating at a restaurant, ask that your food be prepared with less salt or no salt, if possible.  Avoid foods that contain MSG (monosodium glutamate). MSG is sometimes added to [place] food, bouillon, and some canned foods. What foods are recommended? The items listed may not be a complete list. Talk with your dietitian about what dietary choices are best for you. Grains Low-sodium cereals, including oats, puffed wheat and rice, and shredded wheat. Low-sodium crackers. Unsalted rice. Unsalted pasta. Low-sodium bread. Whole-grain breads and whole-grain pasta. Vegetables Fresh or frozen vegetables. "No salt added" canned vegetables. "No salt added" tomato sauce and paste. Low-sodium or reduced-sodium tomato and vegetable juice. Fruits Fresh, frozen, or canned fruit. Fruit juice. Meats and other protein foods Fresh or frozen (no salt added) meat, poultry, seafood, and fish. Low-sodium canned tuna and salmon. Unsalted nuts.  Dried peas, beans, and lentils without added salt. Unsalted canned beans. Eggs. Unsalted nut butters. Dairy Milk. Soy milk. Cheese that is naturally low in sodium, such as ricotta cheese, fresh mozzarella, or Swiss cheese Low-sodium or reduced-sodium cheese. Cream cheese. Yogurt. Fats and  oils Unsalted butter. Unsalted margarine with no trans fat. Vegetable oils such as canola or olive oils. Seasonings and other foods Fresh and dried herbs and spices. Salt-free seasonings. Low-sodium mustard and ketchup. Sodium-free salad dressing. Sodium-free light mayonnaise. Fresh or refrigerated horseradish. Lemon juice. Vinegar. Homemade, reduced-sodium, or low-sodium soups. Unsalted popcorn and pretzels. Low-salt or salt-free chips. What foods are not recommended? The items listed may not be a complete list. Talk with your dietitian about what dietary choices are best for you. Grains Instant hot cereals. Bread stuffing, pancake, and biscuit mixes. Croutons. Seasoned rice or pasta mixes. Noodle soup cups. Boxed or frozen macaroni and cheese. Regular salted crackers. Self-rising flour. Vegetables Sauerkraut, pickled vegetables, and relishes. Olives. [place] fries. Onion rings. Regular canned vegetables (not low-sodium or reduced-sodium). Regular canned tomato sauce and paste (not low-sodium or reduced-sodium). Regular tomato and vegetable juice (not low-sodium or reduced-sodium). Frozen vegetables in sauces. Meats and other protein foods Meat or fish that is salted, canned, smoked, spiced, or pickled. Bacon, ham, sausage, hotdogs, corned beef, chipped beef, packaged lunch meats, salt pork, jerky, pickled herring, anchovies, regular canned tuna, sardines, salted nuts. Dairy Processed cheese and cheese spreads. Cheese curds. Blue cheese. Feta cheese. String cheese. Regular cottage cheese. Buttermilk. Canned milk. Fats and oils Salted butter. Regular margarine. Ghee. Bacon fat. Seasonings and other foods Onion salt, garlic salt, seasoned salt, table salt, and sea salt. Canned and packaged gravies. Worcestershire sauce. Tartar sauce. Barbecue sauce. Teriyaki sauce. Soy sauce, including reduced-sodium. Steak sauce. Fish sauce. Oyster sauce. Cocktail sauce. Horseradish that you find on the shelf.  Regular ketchup and mustard. Meat flavorings and tenderizers. Bouillon cubes. Hot sauce and Tabasco sauce. Premade or packaged marinades. Premade or packaged taco seasonings. Relishes. Regular salad dressings. Salsa. Potato and tortilla chips. Corn chips and puffs. Salted popcorn and pretzels. Canned or dried soups. Pizza. Frozen entrees and pot pies. Summary  Eating less sodium can help lower your blood pressure, reduce swelling, and protect your heart, liver, and kidneys.  Most people on this plan should limit their sodium intake to 1,500-2,000 mg (milligrams) of sodium each day.  Canned, boxed, and frozen foods are high in sodium. Restaurant foods, fast foods, and pizza are also very high in sodium. You also get sodium by adding salt to food.  Try to cook at home, eat more fresh fruits and vegetables, and eat less fast food, canned, processed, or prepared foods. This information is not intended to replace advice given to you by your health care provider. Make sure you discuss any questions you have with your health care provider. Document Released: 07/24/2001 Document Revised: 01/26/2016 Document Reviewed: 01/26/2016 Elsevier Interactive Patient Education  [employer].    If you need a refill on your cardiac medications before your next appointment, please call your pharmacy.

## 2017-06-23 ENCOUNTER — Ambulatory Visit: Payer: Self-pay

## 2017-06-23 ENCOUNTER — Other Ambulatory Visit: Payer: Self-pay | Admitting: Pharmacist

## 2017-06-23 ENCOUNTER — Ambulatory Visit: Payer: Self-pay | Admitting: Pharmacist

## 2017-06-23 NOTE — Patient Outreach (Signed)
[facility]) Care Management  06/23/2017  WINOLA DRUM 02-07-1956 [id number]  3rd unsuccessful call attempt to [name] today.  I left a HIPAA compliant voicemail on her home / phone number.   Plan: I will close [place] case if I have not heard back from [name] by May 15th, 2019.    Ralene Bathe, PharmD, [place] [phone]

## 2017-06-27 ENCOUNTER — Encounter: Payer: Self-pay | Admitting: Nephrology

## 2017-06-27 DIAGNOSIS — R809 Proteinuria, unspecified: Secondary | ICD-10-CM | POA: Diagnosis not present

## 2017-06-27 DIAGNOSIS — N185 Chronic kidney disease, stage 5: Secondary | ICD-10-CM | POA: Diagnosis not present

## 2017-06-27 DIAGNOSIS — Z9889 Other specified postprocedural states: Secondary | ICD-10-CM | POA: Diagnosis not present

## 2017-06-27 DIAGNOSIS — I120 Hypertensive chronic kidney disease with stage 5 chronic kidney disease or end stage renal disease: Secondary | ICD-10-CM | POA: Diagnosis not present

## 2017-06-27 DIAGNOSIS — G40909 Epilepsy, unspecified, not intractable, without status epilepticus: Secondary | ICD-10-CM | POA: Diagnosis not present

## 2017-06-27 DIAGNOSIS — E785 Hyperlipidemia, unspecified: Secondary | ICD-10-CM | POA: Diagnosis not present

## 2017-06-27 DIAGNOSIS — D631 Anemia in chronic kidney disease: Secondary | ICD-10-CM | POA: Diagnosis not present

## 2017-06-27 DIAGNOSIS — I5023 Acute on chronic systolic (congestive) heart failure: Secondary | ICD-10-CM | POA: Diagnosis not present

## 2017-06-27 DIAGNOSIS — Z720 Tobacco use: Secondary | ICD-10-CM | POA: Diagnosis not present

## 2017-06-30 ENCOUNTER — Other Ambulatory Visit: Payer: Self-pay | Admitting: Pharmacist

## 2017-06-30 NOTE — Patient Outreach (Signed)
[place] [facility]) Care Management  06/30/2017  Diana Walters [birth year] [id number]  [facility] pharmacy case is being closed due to the following reasons:  We have been unable to establish and/or maintain contact with the patient.   Case closure letter will be sent to provider and any active [facility] care team members will be updated as appropriate.    Ralene Bathe, PharmD, [place] [phone]

## 2017-07-04 ENCOUNTER — Other Ambulatory Visit: Payer: Self-pay

## 2017-07-04 NOTE — Patient Outreach (Signed)
[facility]) Care Management  07/04/2017  Diana Walters [birth year] [id number]   Transition of care  Referral date: 06/14/17 Referral source: Hospital discharge/ [name], RN  Insurance: Medicare Attempt #2  Telephone call to patient regarding transition of care referral. Unable to reach patient. HIPAA compliant voice message left with call back phone number.    PLAN: RNCM will attempt 3rd telephone outreach to patient within 4 business days.    [name] RN,BSN,CCM [facility] Telephonic  [phone]

## 2017-07-05 ENCOUNTER — Ambulatory Visit (INDEPENDENT_AMBULATORY_CARE_PROVIDER_SITE_OTHER): Payer: Medicare Other | Admitting: Cardiology

## 2017-07-05 ENCOUNTER — Other Ambulatory Visit: Payer: Self-pay

## 2017-07-05 ENCOUNTER — Encounter (INDEPENDENT_AMBULATORY_CARE_PROVIDER_SITE_OTHER): Payer: Self-pay

## 2017-07-05 ENCOUNTER — Encounter: Payer: Self-pay | Admitting: Cardiology

## 2017-07-05 VITALS — BP 140/60 | HR 76 | Ht 70.0 in | Wt 141.0 lb

## 2017-07-05 DIAGNOSIS — E7800 Pure hypercholesterolemia, unspecified: Secondary | ICD-10-CM | POA: Diagnosis not present

## 2017-07-05 DIAGNOSIS — I10 Essential (primary) hypertension: Secondary | ICD-10-CM

## 2017-07-05 DIAGNOSIS — N184 Chronic kidney disease, stage 4 (severe): Secondary | ICD-10-CM

## 2017-07-05 DIAGNOSIS — I5022 Chronic systolic (congestive) heart failure: Secondary | ICD-10-CM | POA: Diagnosis not present

## 2017-07-05 MED ORDER — AMLODIPINE BESYLATE 5 MG PO TABS: 5.0000 mg | ORAL_TABLET | Freq: Every day | ORAL | 3 refills | Status: AC

## 2017-07-05 NOTE — Patient Outreach (Signed)
[facility]) Care Management  07/05/2017  Diana Walters [birth year] [id number]    Transition of care  Referral date:06/14/17 Referral source:Hospital discharge/ [name], RN Insurance:Medicare   Subjective: Telephone call to patient regarding transition of care follow up.  [facility]) Care Management. Patient reports she has been newly diagnosed with congestive heart failure during recent hospitalization.  Patient reports having all medication except her iron and methotrexate. Reports difficulty affording her medications.  Patient states she needs assistance with transportation  Patient states she recently got a scale and has just started weight.  RNCM advised patient to weigh around the same time daily in same weighted clothes or without.  Advised patient to record weights.   Patient states she also has a blood pressure monitor and checks her blood pressure daily.  Discussed congestive heart failure symptoms with patient and heart failure action plan.  Advised patient to call doctor for symptoms as soon as possible. Advised to call 911 for severe onset of symptoms.  Patient states she drinks a supplement daily called [employer].  Patient states she had a history of weight loss. Reports her appetite is much better now.   Patient reports she is attempting to quit smoking.  Denies taking any assistive medication.  States she would like to look into smoking cessation classes if they are not to far.  RNCM discussed and offered [facility] care management services. Patient verbally agreed.  RNCM advised patient to notify MD of any changes in condition prior to scheduled appointment. RNCM provided contact name and number: [phone] or main office number [phone] and 24 hour nurse advise line [phone].  RNCM verified patient aware of 911 services for urgent/ emergent needs.'   Objective: see assessement  Current Medications:  Current Outpatient  Medications  Medication Sig Dispense Refill  . amLODipine (NORVASC) 5 MG tablet Take 1 tablet (5 mg total) by mouth daily. 90 tablet 3  . aspirin EC 81 MG tablet Take 1 tablet (81 mg total) by mouth daily. 90 tablet 3  . atorvastatin (LIPITOR) 40 MG tablet Take 1 tablet (40 mg total) by mouth daily. 90 tablet 3  . furosemide (LASIX) 40 MG tablet Take 1 tablet (40 mg total) by mouth daily. 90 tablet 3  . hydrALAZINE (APRESOLINE) 50 MG tablet Take 1.5 tablets (75 mg total) by mouth 3 (three) times daily. 405 tablet 3  . isosorbide mononitrate (IMDUR) 60 MG 24 hr tablet Take 1 tablet (60 mg total) by mouth daily. 90 tablet 3  . metoprolol succinate (TOPROL-XL) 25 MG 24 hr tablet Take 1 tablet (25 mg total) by mouth daily. Take with or immediately following a meal. 90 tablet 3  . ferrous sulfate 325 (65 FE) MG tablet Take 1 tablet (325 mg total) daily with breakfast by mouth. (Patient not taking: Reported on 07/05/2017) 30 tablet 3  . InFLIXimab (REMICADE IV) Inject into the vein. At rheumatology    . methotrexate 2.5 MG tablet Take 7.5 mg by mouth once a week. Friday    . Multiple Vitamin (MULTIVITAMIN) tablet Take 1 tablet by mouth daily.       No current facility-administered medications for this visit.     Functional Status:  In your present state of health, do you have any difficulty performing the following activities: 07/05/2017 06/06/2017  Hearing? N N  Vision? N N  Difficulty concentrating or making decisions? N N  Walking or climbing stairs? N N  Dressing or bathing? N N  Doing  errands, shopping? N N  Preparing Food and eating ? N -  Using the Toilet? N -  In the past six months, have you accidently leaked urine? N -  Do you have problems with loss of bowel control? N -  Managing your Medications? N -  Managing your Finances? N -  Housekeeping or managing your Housekeeping? N -  Some recent data might be hidden    Fall/Depression Screening: Fall Risk  07/05/2017  Falls in the  past year? No   PHQ 2/9 Scores 07/05/2017  PHQ - 2 Score 0   THN CM Care Plan Problem One     Most Recent Value  Care Plan Problem One  potential for readmission due to recent hospitalization  Role Documenting the Problem One  Care Management Telephonic [place] for Problem One  Active  [facility] Long Term Goal   patient will report no hospital readmission within 20 days  THN Long Term Goal Start Date  07/06/17  Interventions for Problem One Long Term Goal  RNCM advised patient to take her medication as prescribed, weigh daily as directed, reviewed signs/ symptoms of congestive heart failure  [facility] CM Short Term Goal #1   patient will be able verbalize 3 symptoms of heart failure within 30 days  THN CM Short Term Goal #1 Start Date  07/05/17  Interventions for Short Term Goal #1  RNCM reviewed heart failure symptoms with patient  THN CM Short Term Goal #2   patient will report she is weighing and recording weights daily within 30 days  THN CM Short Term Goal #2 Start Date  07/05/17  Interventions for Short Term Goal #2  RNCM discussed importance of weighing and recording weights daily  THN CM Short Term Goal #3  patient will report she is adhering to a low sodium diet as evidenced by patient stating 3 low salt food choices.   THN CM Short Term Goal #3 Start Date  07/05/17  Interventions for Short Tern Goal #3  [facility] mailed patient low salt diet EMMI education material to review.      Assessment: patient will benefit from ongoing transition of care/ heart failure education   Plan: RNCM will refer patient to social worker for transportation assistance and smoking cessation class information RNCM will refer patient to pharmacy for medication assistance.  RNCM will follow patient telephonically  RNCM will send patient [facility] care management welcome packet/ consent and EMMI education material on heart failure/ low salt diet.  RNCM will send patients primary MD involvement letter.   [name] RN,BSN,CCM [facility] Telephonic  [phone]

## 2017-07-05 NOTE — Progress Notes (Signed)
Cardiology Office Note:    Date:  07/06/2017   ID:  Diana Walters, DOB 11-03-1955, MRN [id number]  PCP:  Renato Shin, MD  Cardiologist:  Dorris Carnes, MD  Referring MD: Renato Shin, MD   Chief Complaint  Patient presents with  . Follow-up    CHF    History of Present Illness:    Diana Walters is a 62 y.o. 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  Diana Walters was admitted to the hospital for respiratory distress/acute respiratory failure and treated for acute systolic CHF and acute renal failure 4/21- 06/13/2017.  Had bradycardia in the 30s required BiPAP and elevated troponins with peak of 0.21 and flat pattern.  An echocardiogram showed EF 35-40% which was down from previous.  She was seen by me on 06/22/17 for hospital follow-up at which time she still had lower extremity edema but was much improved since her discharge.  She did appear to be volume overloaded on exam, although she denies any significant symptoms.  She was continuing to smoke but trying to cut down.  Upon review of her medications during that visit it was discovered that the patient had not been taking half of the medications that she was ordered upon discharge.  I resumed hydralazine, Toprol, aspirin and atorvastatin.  I stopped her amlodipine to allow for room for these medications.  We had reviewed heart failure teaching in depth with the patient in this visit.  She was seen in follow up by nephrology on 06/27/17 and noted to have progressed to late stage 4-early 5 CKD. She was told that she will likely need dialysis soon. It was noted that the patient had a sister on dialysis and she had bad memories of it. She does not want to go on dialysis. It was arranged for her to attend kidney options class and she agreed to an appt for access placement although it was felt that she  likley would not follow through. Hospice was offered as an alternative to dialysis and this shocked her. Nephrology plans to follow up in a month and discuss further. She was advised to continue on all current medications.   Diana Walters is here for follow up alone. She is feeling well today. No chest pain/pressure/tightness. Has occ congested cough. Continues to smoke 1/3 PPD. She complains about losing wt. Denies orthpnea, PND. Edema continues to improve, still has 1+ ankle edema. She is avoiding salt. She got a scale and has being weighing once a week.  She is trying to walk more but is limited due to left knee pain. She is a member at [employer] and wants to start water exercises. She did not bring her meds with her so we called her house to review her meds. She is taking them appropriately, however she did not stop the amlodipine as we had planned. Her BP is stable today.    Past Medical History:  Diagnosis Date  . ANEMIA 10/17/2009  . ANXIETY 10/28/2006  . Arthritis   . ASTHMA 10/28/2006   no inhalers per pt  . Blood transfusion without reported diagnosis   . Depression   . Duodenitis without mention of hemorrhage 2005  . Heart murmur   . HYPERCHOLESTEROLEMIA 04/01/2008  . HYPERTENSION 10/28/2006  . Menopause    age 12  . Osteoporosis    bil knees  . SEIZURE  DISORDER 04/01/2008  . Stroke ([place]) 11/2012  . 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 ESOPHAGOGASTRODUODENOSCOPY  01/29/2004  . 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 LOOP RECORDER IMPLANT N/A 12/01/2012   Procedure: LOOP RECORDER IMPLANT;  Surgeon: Coralyn Mark, MD;  Location: [place] CATH LAB;  Service: Cardiovascular;  Laterality: N/A;  . TEE WITHOUT CARDIOVERSION N/A 12/01/2012   Procedure: TRANSESOPHAGEAL ECHOCARDIOGRAM (TEE);  Surgeon: Fay Records, MD;  Location: [facility] ENDOSCOPY;  Service: Cardiovascular;  Laterality: N/A;     Current Medications: Current Meds  Medication Sig  . aspirin EC 81 MG tablet Take 1 tablet (81 mg total) by mouth daily.  Marland Kitchen atorvastatin (LIPITOR) 40 MG tablet Take 1 tablet (40 mg total) by mouth daily.  . ferrous sulfate 325 (65 FE) MG tablet Take 1 tablet (325 mg total) daily with breakfast by mouth. (Patient not taking: Reported on 07/05/2017)  . furosemide (LASIX) 40 MG tablet Take 1 tablet (40 mg total) by mouth daily.  . hydrALAZINE (APRESOLINE) 50 MG tablet Take 1.5 tablets (75 mg total) by mouth 3 (three) times daily.  . InFLIXimab (REMICADE IV) Inject into the vein. At rheumatology  . isosorbide mononitrate (IMDUR) 60 MG 24 hr tablet Take 1 tablet (60 mg total) by mouth daily.  . methotrexate 2.5 MG tablet Take 7.5 mg by mouth once a week. Friday  . metoprolol succinate (TOPROL-XL) 25 MG 24 hr tablet Take 1 tablet (25 mg total) by mouth daily. Take with or immediately following a meal.  . Multiple Vitamin (MULTIVITAMIN) tablet Take 1 tablet by mouth daily.       Allergies:   Codeine and Ibuprofen   Social History   Socioeconomic History  . Marital status: Single    Spouse name: Not on file  . Number of children: Not on file  . Years of education: Not on file  . Highest education level: Not on file  Occupational History  . Not on file  Social Needs  . Financial resource strain: Not on file  . Food insecurity:    Worry: Not on file    Inability: Not on file  . Transportation needs:    Medical: Not on file    Non-medical: Not on file  Tobacco Use  . Smoking status: Current Every Day Smoker    Packs/day: 0.50    Years: 12.00    Pack years: 6.00    Types: Cigarettes  . Smokeless tobacco: Never Used  Substance and Sexual Activity  . Alcohol use: Yes    Alcohol/week: 0.0 oz  . Drug use: Yes    Types: Marijuana  . Sexual activity: Not on file  Lifestyle  . Physical activity:    Days per week: Not on file    Minutes per session: Not on file  . Stress: Not on  file  Relationships  . Social connections:    Talks on phone: Not on file    Gets together: Not on file    Attends religious service: Not on file    Active member of club or organization: Not on file    Attends meetings of clubs or organizations: Not on file    Relationship status: Not on file  Other Topics Concern  . Not on file  Social History Narrative  . Not on file     Family History: The patient's family history includes Breast  cancer in her mother; Heart disease in her mother. There is no history of Colon cancer, Esophageal cancer, Pancreatic cancer, Rectal cancer, or Stomach cancer. ROS:   Please see the history of present illness.     All other systems reviewed and are negative.  EKGs/Labs/Other Studies Reviewed:    The following studies were reviewed today:  Echocardiogram 06/05/17 Study Conclusions - Left ventricle: Inferior , septal and apical hypokinesis The cavity size was moderately dilated. Wall thickness was increased in a pattern of mild LVH. Systolic function was moderately reduced. The estimated ejection fraction was in the range of 35% to 40%. Doppler parameters are consistent with both elevated ventricular end-diastolic filling pressure and elevated left atrial filling pressure. - Aortic valve: Calcified right coronary cusp. There was mild regurgitation. - Mitral valve: Severe posterior annular calcification. There was mild regurgitation. - Left atrium: The atrium was mildly dilated. - Atrial septum: No defect or patent foramen ovale was identified. - Tricuspid valve: There was moderate-severe regurgitation.    EKG:  EKG is not ordered today.    Recent Labs: 06/05/2017: ALT 14; B Natriuretic Peptide >4,500.0 06/06/2017: Magnesium 2.1 06/07/2017: TSH 0.587 06/22/2017: BUN 31; Creatinine, Ser 3.61; Hemoglobin 10.3; Platelets 304; Potassium 4.2; Sodium 141   Recent Lipid Panel    Component Value Date/Time   CHOL 162 05/18/2016 0859     TRIG 97 05/18/2016 0859   HDL 48 05/18/2016 0859   CHOLHDL 3.4 05/18/2016 0859   CHOLHDL 3 12/20/2013 1016   VLDL 20.2 12/20/2013 1016   LDLCALC 95 05/18/2016 0859   LDLDIRECT 74.3 11/27/2010 1058    Physical Exam:    VS:  BP 140/60   Pulse 76   Ht 5\' 10"  (1.778 m)   Wt 141 lb (64 kg)   SpO2 98%   BMI 20.23 kg/m     Wt Readings from Last 3 Encounters:  07/05/17 141 lb (64 kg)  07/05/17 141 lb (64 kg)  06/22/17 145 lb 12 oz (66.1 kg)      Physical Exam  Constitutional: She is oriented to person, place, and time. No distress.  Thin female. Walks gingerly due to knee pain from RA.   HENT:  Head: Normocephalic and atraumatic.  Neck: Normal range of motion. Neck supple. No JVD present.  Cardiovascular: Normal rate and regular rhythm. Exam reveals no gallop and no friction rub.  Murmur heard.  Systolic murmur is present with a grade of 2/6 at the upper right sternal border and upper left sternal border radiating to the neck. Pulmonary/Chest: Effort normal. No respiratory distress. She has wheezes. She has no rales.  Few scattered wheezes  Abdominal: Soft. Bowel sounds are normal.  Musculoskeletal: She exhibits edema.  Decrease ROM of bilateral lower extremities 1+ ankle edema  Neurological: She is alert and oriented to person, place, and time.  Skin: Skin is warm and dry.  Psychiatric: She has a normal mood and affect. Her behavior is normal. Thought content normal.     ASSESSMENT:    1. Acute systolic CHF (congestive heart failure) ([place])   2. Acute renal failure superimposed on stage 3 chronic kidney disease, unspecified acute renal failure type ([place])   3. Essential hypertension   4. HYPERCHOLESTEROLEMIA    PLAN:    In order of problems listed above:  t Chronic systolic CHF: She is now taking her her optimal medical therapy now. She still has some ankle edema but no orthopnea or dyspnea.  Wt is down 4 lbs but still  has some more fluid to remove. Continue  current therapy. Low sodium diet. Reviewed again heart failure teaching and advised her to weigh daily and report increase in wt, edema, shortness of breath or orthopnea. She verbalizes understanding. Multiple handouts given. Pt want to continue close follow up. Will see in one month.   Stage IV-V CKD:  baseline Scr 1.8-2.0. Creatinine elevated in hospital to 4.12. SCr 3.81 on discharge. (To follow up with nephrology, has appointment next week) serum creatinine at follow-up on 5/8 continue to improve at 3.61. Seen by nephrology who anticipate need for dialysis in the near future. Advised to continue all current meds. Pt has follow up in a month.   Hypertension: BP slightly elevated. Will add amlodipine back and continue all other meds.   Tobacco use: She is still working on cessation.  1Pack per 3 days.   Hyperlipidemia: Last LDL was 95 in 05/2016. Atorvastatin was started in the hospital. Will need FLP and AST at next appt.     Medication Adjustments/Labs and Tests Ordered: Current medicines are reviewed at length with the patient today.  Concerns regarding medicines are outlined above. Labs and tests ordered and medication changes are outlined in the patient instructions below:  Patient Instructions  Medication Instructions: Your physician has recommended you make the following change in your medication:  START: Amlodipine 5 mg taking 1 tablet daily   Continue all other medications   Labwork: None Ordered  Procedures/Testing: None Ordered  Follow-Up: Follow appointment with Daune Perch NP on 08/04/17 at 8:00 AM Please arrive 15 mins early prior to appointment   Any Additional Special Instructions Will Be Listed Below (If Applicable).   Heart Failure Action Plan A heart failure action plan helps you understand what to do when you have symptoms of heart failure. Follow the plan that was created by you and your health care provider. Review your plan each time you visit your health  care provider. Red zone These signs and symptoms mean you should get medical help right away:  You have trouble breathing when resting.  You have a dry cough that is getting worse.  You have swelling or pain in your legs or abdomen that is getting worse.  You suddenly gain more than 2-3 lb (0.9-1.4 kg) in a day, or more than 5 lb (2.3 kg) in one week. This amount may be more or less depending on your condition.  You have trouble staying awake or you feel confused.  You have chest pain.  You do not have an appetite.  You pass out.  If you experience any of these symptoms:  Call your local emergency services (911 in the [place] right away or seek help at the emergency department of the nearest hospital.  Yellow zone These signs and symptoms mean your condition may be getting worse and you should make some changes:  You have trouble breathing when you are active or you need to sleep with extra pillows.  You have swelling in your legs or abdomen.  You gain 2-3 lb (0.9-1.4 kg) in one day, or 5 lb (2.3 kg) in one week. This amount may be more or less depending on your condition.  You get tired easily.  You have trouble sleeping.  You have a dry cough.  If you experience any of these symptoms:  Contact your health care provider within the next day.  Your health care provider may adjust your medicines.  Green zone These signs mean you are doing well and  can continue what you are doing:  You do not have shortness of breath.  You have very little swelling or no new swelling.  Your weight is stable (no gain or loss).  You have a normal activity level.  You do not have chest pain or any other new symptoms.  Follow these instructions at home:  Take over-the-counter and prescription medicines only as told by your health care provider.  Weigh yourself daily. Your target weight is __________ lb (__________ kg). ? Call your health care provider if you gain more than  __________ lb (__________ kg) in a day, or more than __________ lb (__________ kg) in one week.  Eat a heart-healthy diet. Work with a diet and nutrition specialist (dietitian) to create an eating plan that is best for you.  Keep all follow-up visits as told by your health care provider. This is important. Where to find more information:  American Heart Association: www.heart.org Summary  Follow the action plan that was created by you and your health care provider.  Get help right away if you have any symptoms in the Red zone. This information is not intended to replace advice given to you by your health care provider. Make sure you discuss any questions you have with your health care provider. Document Released: 03/13/2016 Document Revised: 03/13/2016 Document Reviewed: 03/13/2016 Elsevier Interactive Patient Education  [employer].     If you need a refill on your cardiac medications before your next appointment, please call your pharmacy.      Signed, Daune Perch, NP  07/06/2017 1:37 PM    [facility]

## 2017-07-05 NOTE — Patient Instructions (Signed)
Medication Instructions: Your physician has recommended you make the following change in your medication:  START: Amlodipine 5 mg taking 1 tablet daily   Continue all other medications   Labwork: None Ordered  Procedures/Testing: None Ordered  Follow-Up: Follow appointment with Daune Perch NP on 08/04/17 at 8:00 AM Please arrive 15 mins early prior to appointment   Any Additional Special Instructions Will Be Listed Below (If Applicable).   Heart Failure Action Plan A heart failure action plan helps you understand what to do when you have symptoms of heart failure. Follow the plan that was created by you and your health care provider. Review your plan each time you visit your health care provider. Red zone These signs and symptoms mean you should get medical help right away:  You have trouble breathing when resting.  You have a dry cough that is getting worse.  You have swelling or pain in your legs or abdomen that is getting worse.  You suddenly gain more than 2-3 lb (0.9-1.4 kg) in a day, or more than 5 lb (2.3 kg) in one week. This amount may be more or less depending on your condition.  You have trouble staying awake or you feel confused.  You have chest pain.  You do not have an appetite.  You pass out.  If you experience any of these symptoms:  Call your local emergency services (911 in the [place] right away or seek help at the emergency department of the nearest hospital.  Yellow zone These signs and symptoms mean your condition may be getting worse and you should make some changes:  You have trouble breathing when you are active or you need to sleep with extra pillows.  You have swelling in your legs or abdomen.  You gain 2-3 lb (0.9-1.4 kg) in one day, or 5 lb (2.3 kg) in one week. This amount may be more or less depending on your condition.  You get tired easily.  You have trouble sleeping.  You have a dry cough.  If you experience any of these  symptoms:  Contact your health care provider within the next day.  Your health care provider may adjust your medicines.  Green zone These signs mean you are doing well and can continue what you are doing:  You do not have shortness of breath.  You have very little swelling or no new swelling.  Your weight is stable (no gain or loss).  You have a normal activity level.  You do not have chest pain or any other new symptoms.  Follow these instructions at home:  Take over-the-counter and prescription medicines only as told by your health care provider.  Weigh yourself daily. Your target weight is __________ lb (__________ kg). ? Call your health care provider if you gain more than __________ lb (__________ kg) in a day, or more than __________ lb (__________ kg) in one week.  Eat a heart-healthy diet. Work with a diet and nutrition specialist (dietitian) to create an eating plan that is best for you.  Keep all follow-up visits as told by your health care provider. This is important. Where to find more information:  American Heart Association: www.heart.org Summary  Follow the action plan that was created by you and your health care provider.  Get help right away if you have any symptoms in the Red zone. This information is not intended to replace advice given to you by your health care provider. Make sure you discuss any questions you have  with your health care provider. Document Released: 03/13/2016 Document Revised: 03/13/2016 Document Reviewed: 03/13/2016 Elsevier Interactive Patient Education  [employer].     If you need a refill on your cardiac medications before your next appointment, please call your pharmacy.

## 2017-07-13 ENCOUNTER — Other Ambulatory Visit: Payer: Self-pay

## 2017-07-13 NOTE — Patient Outreach (Signed)
[facility]) Care Management  07/13/2017  Diana Walters July 31, 1955 [id number]   Transition of care  Referral date: 06/14/17 Referral source: Hospital discharge/ [name], RN  Insurance: Medicare Attempt #1  Telephone call to patient regarding utilization management referral. Unable to reach patient. HIPAA compliant voice message left with call back phone number.   PLAN: RNCM will attempt 2nd telephone call to patient within 4 business days. RNCM will send outreach letter.   [name] RN,BSN,CCM [facility] Telephonic  [phone]

## 2017-07-14 ENCOUNTER — Other Ambulatory Visit: Payer: Self-pay

## 2017-07-14 DIAGNOSIS — Z01812 Encounter for preprocedural laboratory examination: Secondary | ICD-10-CM

## 2017-07-14 DIAGNOSIS — N184 Chronic kidney disease, stage 4 (severe): Secondary | ICD-10-CM

## 2017-07-15 ENCOUNTER — Encounter: Payer: Self-pay | Admitting: Vascular Surgery

## 2017-07-15 ENCOUNTER — Ambulatory Visit (HOSPITAL_COMMUNITY)
Admission: RE | Admit: 2017-07-15 | Discharge: 2017-07-15 | Disposition: A | Discharge status: Home / Self Care | Payer: Medicare Other | Source: Ambulatory Visit | Attending: Vascular Surgery | Admitting: Vascular Surgery

## 2017-07-15 ENCOUNTER — Other Ambulatory Visit: Payer: Self-pay

## 2017-07-15 ENCOUNTER — Ambulatory Visit (INDEPENDENT_AMBULATORY_CARE_PROVIDER_SITE_OTHER): Payer: Medicare Other | Admitting: Vascular Surgery

## 2017-07-15 ENCOUNTER — Ambulatory Visit (INDEPENDENT_AMBULATORY_CARE_PROVIDER_SITE_OTHER)
Admission: RE | Admit: 2017-07-15 | Discharge: 2017-07-15 | Disposition: A | Discharge status: Home / Self Care | Payer: Medicare Other | Source: Ambulatory Visit | Attending: Vascular Surgery | Admitting: Vascular Surgery

## 2017-07-15 VITALS — BP 155/65 | HR 61 | Temp 97.1°F | Resp 16 | Ht 70.0 in | Wt 141.0 lb

## 2017-07-15 DIAGNOSIS — Z01812 Encounter for preprocedural laboratory examination: Secondary | ICD-10-CM

## 2017-07-15 DIAGNOSIS — I129 Hypertensive chronic kidney disease with stage 1 through stage 4 chronic kidney disease, or unspecified chronic kidney disease: Secondary | ICD-10-CM | POA: Insufficient documentation

## 2017-07-15 DIAGNOSIS — E785 Hyperlipidemia, unspecified: Secondary | ICD-10-CM | POA: Insufficient documentation

## 2017-07-15 DIAGNOSIS — N184 Chronic kidney disease, stage 4 (severe): Secondary | ICD-10-CM

## 2017-07-15 NOTE — Progress Notes (Signed)
[facility]ist of [place]  Patient name: Diana Walters MRN: [id number] DOB: 10/25/1955 Sex: female  REASON FOR VISIT: Discuss access options for hemodialysis  HPI: ALEXSIS KATHMAN is a 62 y.o. female here today for discussion of access options for hemodialysis.  She had been seen in our practice in the past by Dr. Trula Slade for follow-up of a hybrid thoracic aneurysm repair at [facility].  She has had progressive renal insufficiency and was seen by [facility] 2 weeks ago with request for urgent appointment.  She has had progression but is not on hemodialysis currently.  She had a sister, Sherlyn Lick who was on hemodialysis for years.  She reports that she is ambidextrous.  Past Medical History:  Diagnosis Date  . ANEMIA 10/17/2009  . ANXIETY 10/28/2006  . Arthritis   . ASTHMA 10/28/2006   no inhalers per pt  . Blood transfusion without reported diagnosis   . Depression   . Duodenitis without mention of hemorrhage 2005  . Heart murmur   . HYPERCHOLESTEROLEMIA 04/01/2008  . HYPERTENSION 10/28/2006  . Menopause    age 21  . Osteoporosis    bil knees  . SEIZURE DISORDER 04/01/2008  . Stroke ([place]) 11/2012  . THORACIC AORTIC ANEURYSM 04/01/2008    Family History  Problem Relation Age of Onset  . Breast cancer Mother   . Heart disease Mother        before age 39  . Colon cancer Neg Hx   . Esophageal cancer Neg Hx   . Pancreatic cancer Neg Hx   . Rectal cancer Neg Hx   . Stomach cancer Neg Hx     SOCIAL HISTORY: Social History   Tobacco Use  . Smoking status: Current Every Day Smoker    Packs/day: 0.50    Years: 12.00    Pack years: 6.00    Types: Cigarettes  . Smokeless tobacco: Never Used  Substance Use Topics  . Alcohol use: Yes    Alcohol/week: 0.0 oz    Allergies  Allergen Reactions  . Codeine Other (See Comments)    hallucinations  . Ibuprofen     Can't take due to medications    Current Outpatient  Medications  Medication Sig Dispense Refill  . amLODipine (NORVASC) 5 MG tablet Take 1 tablet (5 mg total) by mouth daily. 90 tablet 3  . aspirin EC 81 MG tablet Take 1 tablet (81 mg total) by mouth daily. 90 tablet 3  . atorvastatin (LIPITOR) 40 MG tablet Take 1 tablet (40 mg total) by mouth daily. 90 tablet 3  . ferrous sulfate 325 (65 FE) MG tablet Take 1 tablet (325 mg total) daily with breakfast by mouth. 30 tablet 3  . furosemide (LASIX) 40 MG tablet Take 1 tablet (40 mg total) by mouth daily. 90 tablet 3  . hydrALAZINE (APRESOLINE) 50 MG tablet Take 1.5 tablets (75 mg total) by mouth 3 (three) times daily. 405 tablet 3  . InFLIXimab (REMICADE IV) Inject into the vein. At rheumatology    . methotrexate 2.5 MG tablet Take 7.5 mg by mouth once a week. Friday    . metoprolol succinate (TOPROL-XL) 25 MG 24 hr tablet Take 1 tablet (25 mg total) by mouth daily. Take with or immediately following a meal. 90 tablet 3  . Multiple Vitamin (MULTIVITAMIN) tablet Take 1 tablet by mouth daily.      . isosorbide mononitrate (IMDUR) 60 MG 24 hr tablet Take 1 tablet (60 mg total)  by mouth daily. 90 tablet 3   No current facility-administered medications for this visit.     REVIEW OF SYSTEMS:  [X]  denotes positive finding, [ ]  denotes negative finding Cardiac  Comments:  Chest pain or chest pressure:    Shortness of breath upon exertion:    Short of breath when lying flat:    Irregular heart rhythm:        Vascular    Pain in calf, thigh, or hip brought on by ambulation:    Pain in feet at night that wakes you up from your sleep:     Blood clot in your veins:    Leg swelling:           PHYSICAL EXAM: Vitals:   07/15/17 1306 07/15/17 1312  BP: (!) 158/67 (!) 155/65  Pulse: 62 61  Resp: 16   Temp: (!) 97.1 F (36.2 C)   TempSrc: Oral   SpO2: 100%   Weight: 141 lb (64 kg)   Height: 5\' 10"  (1.778 m)     GENERAL: The patient is a well-nourished female, in no acute distress. The vital  signs are documented above. CARDIOVASCULAR: Diminished but palpable left radial pulse.  2-3+ right radial pulse.  Very small surface veins bilaterally PULMONARY: There is good air exchange  MUSCULOSKELETAL: There are no major deformities or cyanosis. NEUROLOGIC: No focal weakness or paresthesias are detected. SKIN: There are no ulcers or rashes noted. PSYCHIATRIC: The patient has a normal affect.  DATA:  Upper extremity vein mapping showed small surface veins bilaterally.  On her right arm the cephalic vein was approximately 2-1/2 mm.  On the basilic vein was 3 mm.  The vein was less than 2 mm on the left arm.  Arterial flow on the left side with monophasic flow throughout  MEDICAL ISSUES: I discussed options with the patient and her family present regarding catheter for acute hemodialysis and AV fistulas and AV grafts.  She is not a candidate for left arm access due to her ligation at the time of stent grafting at [facility].  Her veins are marginal on the right arm for fistula.  I explained that at the time of surgery that decision would be made whether to proceed with either a cephalic or basilic fistula versus primarily placing a right arm AV graft.  She has an appointment with [facility] in 2 weeks for continued discussion.  We are available for right arm access if the decision to proceed is made.    Rosetta Posner, MD FACS [facility] Tel [phone] Pager [phone]

## 2017-07-15 NOTE — Progress Notes (Signed)
Vitals:   07/15/17 1306  BP: (!) 158/67  Pulse: 62  Resp: 16  Temp: (!) 97.1 F (36.2 C)  TempSrc: Oral  SpO2: 100%  Weight: 141 lb (64 kg)  Height: 5\' 10"  (1.778 m)

## 2017-07-15 NOTE — Patient Outreach (Signed)
[place] [facility]) Care Management  07/15/2017  Diana Walters December 22, 1955 [id number]   Transition of care  Referral date:06/14/17 Referral source:Hospital discharge/ [name], RN Insurance:Medicare Attempt #3  Telephone call to patient regarding utilization management referral. Unable to reach patient. HIPAA compliant voice message left with call back phone number.   PLAN: RNCM will attempt 3rd telephone call to patient within 4 business days.   [name] RN,BSN,CCM [facility] Telephonic  [phone]

## 2017-07-20 ENCOUNTER — Ambulatory Visit: Payer: Self-pay

## 2017-07-25 ENCOUNTER — Other Ambulatory Visit: Payer: Self-pay

## 2017-07-25 NOTE — Patient Outreach (Signed)
[place] [facility]) Care Management  07/25/2017  Diana Walters December 22, 1955 [id number]    Transition of care  Referral date:06/14/17 Referral source:Hospital discharge/ [name], RN Insurance:Medicare Attempt #3  Telephone call to patient regarding utilization management referral. Unable to reach patient. HIPAA compliant voice message left with call back phone number.   PLAN: if no return call from patient will proceed with case closure  [name] RN,BSN,CCM [facility] Telephonic  [phone]

## 2017-07-27 ENCOUNTER — Other Ambulatory Visit: Payer: Self-pay

## 2017-07-27 NOTE — Patient Outreach (Signed)
[place] [facility]) Care Management  07/27/2017  Diana Walters 1955-03-26 [id number]  . No response from patient after 3 telephone calls and outreach letter attempt.  PLAN;  RNCM will close patient due to being unable to establish contact with patient.  RNCM will send closure notification to patient's primary care provider  [name] RN,BSN,CCM [facility] Telephonic  [phone]

## 2017-08-03 DIAGNOSIS — Z720 Tobacco use: Secondary | ICD-10-CM | POA: Insufficient documentation

## 2017-08-03 NOTE — Progress Notes (Addendum)
Cardiology Office Note:    Date:  08/04/2017   ID:  Diana Walters, DOB Jun 03, 1955, MRN [id number]  PCP:  Renato Shin, MD  Cardiologist:  Dorris Carnes, MD  Referring MD: Renato Shin, MD   Chief Complaint  Patient presents with  . Follow-up    CHF    History of Present Illness:    Diana Walters is a 62 y.o. 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  Ms. Veronica was admitted to the hospital for respiratory distress/acute respiratory failure and treated for acute systolic CHF and acute renal failure 4/21- 06/13/2017.  Had bradycardia in the 30s required BiPAP and elevated troponins with peak of 0.21 and flat pattern.  An echocardiogram showed EF 35-40% which was down from previous.  I saw her in follow up on 06/22/17 at which time she denied HF symptoms but appeared volume overloaded with JVD, crackles in lung bases and lower extremity edema. She also was confused on medications and was not taking most of them. We got those restarted and she was doing better at the next appt on 07/05/17 with improved peripheral edema but it was still present, and her JVD had improved. Her wt was down 4 lbs but she still looked to have some excess volume. We reviewed heart failure teaching at both appointments.   She was seen in follow up by nephrology on 06/27/17 and noted to have progressed to late stage 4-early 5 CKD. She was told that she will likely need dialysis soon. It was noted that the patient had a sister on dialysis and she had bad memories of it and she initially declined dialysis but has been in discussions with nephrology. She has been evaluated by Dr. Donnetta Hutching, vascular surgeon for possible dialysis access.   She is here today in a wheelchair with her sister in law for close follow up of systolic heart failure. She denies shortness of breath except when  she is "struggling" due to joint pain which is significant due to RA. No orthopnea or PND. No chest pain/pressure/tightness. Still has lower leg edema, improved. She is unable to use compression stockings because she cant get them on with her limited hand movement and strength related to arthritis. Her biggest complaint is knee and generalized joint pain and she wants pain med. She asked me several times to prescribe pain meds and I referred her to her PCP for that. She does appear uncomfortable with reduced ability to ambulate and get around.  She reports that she is not using any salt and she reads packages to check sodium content. SHe has not finished readign all of the information I gave her at last visit but she still has the info. She has not gotten her scale yet. Her daughter has one for her but has not delivered it yet. She reports compliance with all of her meds.    Past Medical History:  Diagnosis Date  . ANEMIA 10/17/2009  . ANXIETY 10/28/2006  . Arthritis   . ASTHMA 10/28/2006   no inhalers per pt  . Blood transfusion without reported diagnosis   . Depression   . Duodenitis without mention of hemorrhage 2005  . Heart murmur   . HYPERCHOLESTEROLEMIA 04/01/2008  . HYPERTENSION 10/28/2006  . Menopause    age 21  . Osteoporosis    bil knees  . SEIZURE DISORDER 04/01/2008  .  Stroke ([place]) 11/2012  . 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 ESOPHAGOGASTRODUODENOSCOPY  01/29/2004  . 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 LOOP RECORDER IMPLANT N/A 12/01/2012   Procedure: LOOP RECORDER IMPLANT;  Surgeon: Coralyn Mark, MD;  Location: [place] CATH LAB;  Service: Cardiovascular;  Laterality: N/A;  . TEE WITHOUT CARDIOVERSION N/A 12/01/2012   Procedure: TRANSESOPHAGEAL ECHOCARDIOGRAM (TEE);  Surgeon: Fay Records, MD;  [facility] ENDOSCOPY;  Service: Cardiovascular;  Laterality: N/A;     Current Medications: Current Meds  Medication Sig  . amLODipine (NORVASC) 5 MG tablet Take 1 tablet (5 mg total) by mouth daily.  Marland Kitchen aspirin EC 81 MG tablet Take 1 tablet (81 mg total) by mouth daily.  Marland Kitchen atorvastatin (LIPITOR) 40 MG tablet Take 1 tablet (40 mg total) by mouth daily.  . furosemide (LASIX) 40 MG tablet Take 1 tablet (40 mg total) by mouth daily.  . hydrALAZINE (APRESOLINE) 50 MG tablet Take 1.5 tablets (75 mg total) by mouth 3 (three) times daily.  . isosorbide mononitrate (IMDUR) 60 MG 24 hr tablet Take 1 tablet (60 mg total) by mouth daily.  . methotrexate 2.5 MG tablet Take 7.5 mg by mouth once a week. Friday  . metoprolol succinate (TOPROL-XL) 25 MG 24 hr tablet Take 25 mg by mouth daily. Take with or immediately following a meal. Take with 50 mg to equal 75 mg daily.  . metoprolol succinate (TOPROL-XL) 50 MG 24 hr tablet Take 50 mg by mouth daily. Take with or immediately following a meal. Take with 25 mg to equal 75 mg daily.  . Multiple Vitamin (MULTIVITAMIN) tablet Take 1 tablet by mouth daily.       Allergies:   Codeine and Ibuprofen   Social History   Socioeconomic History  . Marital status: Single    Spouse name: Not on file  . Number of children: Not on file  . Years of education: Not on file  . Highest education level: Not on file  Occupational History  . Not on file  Social Needs  . Financial resource strain: Not on file  . Food insecurity:    Worry: Not on file    Inability: Not on file  . Transportation needs:    Medical: Not on file    Non-medical: Not on file  Tobacco Use  . Smoking status: Current Every Day Smoker    Packs/day: 0.50    Years: 12.00    Pack years: 6.00    Types: Cigarettes  . Smokeless tobacco: Never Used  Substance and Sexual Activity  . Alcohol use: Yes    Alcohol/week: 0.0 oz  . Drug use: Yes    Types: Marijuana  . Sexual activity: Not on file  Lifestyle  . Physical activity:    Days per week: Not on file     Minutes per session: Not on file  . Stress: Not on file  Relationships  . Social connections:    Talks on phone: Not on file    Gets together: Not on file    Attends religious service: Not on file    Active member of club or organization: Not on file    Attends meetings of clubs or organizations: Not on file    Relationship status: Not on file  Other Topics Concern  . Not on file  Social History Narrative  . Not on  file     Family History: The patient's family history includes Breast cancer in her mother; Heart disease in her mother. There is no history of Colon cancer, Esophageal cancer, Pancreatic cancer, Rectal cancer, or Stomach cancer. ROS:   Please see the history of present illness.     All other systems reviewed and are negative.  EKGs/Labs/Other Studies Reviewed:    The following studies were reviewed today:  Echocardiogram 06/05/17 Study Conclusions - Left ventricle: Inferior , septal and apical hypokinesis The cavity size was moderately dilated. Wall thickness was increased in a pattern of mild LVH. Systolic function was moderately reduced. The estimated ejection fraction was in the range of 35% to 40%. Doppler parameters are consistent with both elevated ventricular end-diastolic filling pressure and elevated left atrial filling pressure. - Aortic valve: Calcified right coronary cusp. There was mild regurgitation. - Mitral valve: Severe posterior annular calcification. There was mild regurgitation. - Left atrium: The atrium was mildly dilated. - Atrial septum: No defect or patent foramen ovale was identified. - Tricuspid valve: There was moderate-severe regurgitation.   EKG:  EKG is not ordered today.    Recent Labs: 06/05/2017: ALT 14; B Natriuretic Peptide >4,500.0 06/06/2017: Magnesium 2.1 06/07/2017: TSH 0.587 06/22/2017: BUN 31; Creatinine, Ser 3.61; Hemoglobin 10.3; Platelets 304; Potassium 4.2; Sodium 141   Recent Lipid Panel      Component Value Date/Time   CHOL 162 05/18/2016 0859   TRIG 97 05/18/2016 0859   HDL 48 05/18/2016 0859   CHOLHDL 3.4 05/18/2016 0859   CHOLHDL 3 12/20/2013 1016   VLDL 20.2 12/20/2013 1016   LDLCALC 95 05/18/2016 0859   LDLDIRECT 74.3 11/27/2010 1058    Physical Exam:    VS:  BP (!) 154/64   Pulse 63   Ht 5\' 10"  (1.778 m)   Wt 133 lb 12.8 oz (60.7 kg)   BMI 19.20 kg/m     Wt Readings from Last 3 Encounters:  08/04/17 133 lb 12.8 oz (60.7 kg)  07/15/17 141 lb (64 kg)  07/05/17 141 lb (64 kg)     Physical Exam  Constitutional: She is oriented to person, place, and time. No distress.  Thin, frail female in wheelchair  HENT:  Head: Normocephalic and atraumatic.  Neck: Normal range of motion. Neck supple. JVD present.  1/2 cm JVD  Cardiovascular: Normal rate and regular rhythm.  Murmur heard.  Harsh midsystolic murmur is present with a grade of 3/6 at the upper right sternal border and upper left sternal border radiating to the neck. Pulmonary/Chest: Effort normal and breath sounds normal. No respiratory distress. She has no wheezes. She has no rales.  Abdominal: Soft. Bowel sounds are normal. She exhibits no distension.  Musculoskeletal: She exhibits edema.  Decreased ROM, generalized weakness and generalized joint tenderness especially in the knees.  Trace-1+ lower leg edema  Neurological: She is alert and oriented to person, place, and time.  Skin: Skin is warm and dry.  Psychiatric: She has a normal mood and affect. Her behavior is normal. Thought content normal.     ASSESSMENT:    1. Acute systolic CHF (congestive heart failure) ([place])   2. Essential hypertension   3. CKD (chronic kidney disease) stage 4, GFR 15-29 ml/min (HCC)   4. Tobacco abuse   5. HYPERCHOLESTEROLEMIA    PLAN:    In order of problems listed above:  Chronic systolic heart failure: Echo 05/2017 EF 35-40%, down from 55-60% in 12/2016. On guideline directed medical therapy considering CKD,  Toprol, Imdur, hydralazine, lasix. Now taking her meds consistently. Appears to still be mildy volume overloaded, but better than she has been. I think she would benefit from the Advanced Heart Failure team considering her complexity with worsening renal failure. I will discuss with Dr. Harrington Challenger.   Stage IV-V CKD: baseline Scr 1.8-2.0. Creatinine elevated in hospital to 4.12. SCr 3.81 on discharge. (To follow up with nephrology, Missed her appt yesterday due to transportation, has appointment next week) serum creatinine at follow-up on 5/8 continue to improve at 3.61. Seen by nephrology who anticipate need for dialysis in the near future. Advised to continue all current meds. She has been evaluated by vascular surgeon for possible dialysis access placement.   Hypertension:   Amlodipine had been previously discontinue but I added it back at last appt for elevated BP. BP is still mildly elevated, but pt is in considerable pain. She will see nephrology next week  Tobacco Use: Still smoking ~3 cigarettes per day and is working on down to 2.   Hyperlipidemia: Last LDL was 95 in 05/2016. Atorvastatin was started in the hospital. Will check FLP and AST at next appt.    Medication Adjustments/Labs and Tests Ordered: Current medicines are reviewed at length with the patient today.  Concerns regarding medicines are outlined above. Labs and tests ordered and medication changes are outlined in the patient instructions below:  Patient Instructions  Medication Instructions: Your physician recommends that you continue on your current medications as directed. Please refer to the Current Medication list given to you today.   Labwork: None  Procedures/Testing: None  Follow-Up: Your physician recommends that you schedule a follow-up appointment in: 3 months with Dr.Ross   Any Additional Special Instructions Will Be Listed Below (If Applicable).     If you need a refill on your cardiac medications before  your next appointment, please call your pharmacy.      Signed, Daune Perch, NP  08/04/2017 1:26 PM    [facility]

## 2017-08-04 ENCOUNTER — Encounter: Payer: Self-pay | Admitting: Cardiology

## 2017-08-04 ENCOUNTER — Ambulatory Visit (INDEPENDENT_AMBULATORY_CARE_PROVIDER_SITE_OTHER): Payer: Medicare Other | Admitting: Cardiology

## 2017-08-04 VITALS — BP 154/64 | HR 63 | Ht 70.0 in | Wt 133.8 lb

## 2017-08-04 DIAGNOSIS — N184 Chronic kidney disease, stage 4 (severe): Secondary | ICD-10-CM

## 2017-08-04 DIAGNOSIS — I10 Essential (primary) hypertension: Secondary | ICD-10-CM | POA: Diagnosis not present

## 2017-08-04 DIAGNOSIS — E7800 Pure hypercholesterolemia, unspecified: Secondary | ICD-10-CM

## 2017-08-04 DIAGNOSIS — I5021 Acute systolic (congestive) heart failure: Secondary | ICD-10-CM

## 2017-08-04 DIAGNOSIS — Z720 Tobacco use: Secondary | ICD-10-CM

## 2017-08-04 NOTE — Patient Instructions (Signed)
Medication Instructions: Your physician recommends that you continue on your current medications as directed. Please refer to the Current Medication list given to you today.   Labwork: None  Procedures/Testing: None  Follow-Up: Your physician recommends that you schedule a follow-up appointment in: 3 months with Dr.Ross   Any Additional Special Instructions Will Be Listed Below (If Applicable).     If you need a refill on your cardiac medications before your next appointment, please call your pharmacy.

## 2017-08-12 ENCOUNTER — Telehealth: Payer: Self-pay | Admitting: Endocrinology

## 2017-08-12 DIAGNOSIS — D631 Anemia in chronic kidney disease: Secondary | ICD-10-CM | POA: Diagnosis not present

## 2017-08-12 DIAGNOSIS — I5023 Acute on chronic systolic (congestive) heart failure: Secondary | ICD-10-CM | POA: Diagnosis not present

## 2017-08-12 DIAGNOSIS — G40909 Epilepsy, unspecified, not intractable, without status epilepticus: Secondary | ICD-10-CM | POA: Diagnosis not present

## 2017-08-12 DIAGNOSIS — Z9889 Other specified postprocedural states: Secondary | ICD-10-CM | POA: Diagnosis not present

## 2017-08-12 DIAGNOSIS — R809 Proteinuria, unspecified: Secondary | ICD-10-CM | POA: Diagnosis not present

## 2017-08-12 DIAGNOSIS — N185 Chronic kidney disease, stage 5: Secondary | ICD-10-CM | POA: Diagnosis not present

## 2017-08-12 DIAGNOSIS — Z720 Tobacco use: Secondary | ICD-10-CM | POA: Diagnosis not present

## 2017-08-12 DIAGNOSIS — E785 Hyperlipidemia, unspecified: Secondary | ICD-10-CM | POA: Diagnosis not present

## 2017-08-12 DIAGNOSIS — I120 Hypertensive chronic kidney disease with stage 5 chronic kidney disease or end stage renal disease: Secondary | ICD-10-CM | POA: Diagnosis not present

## 2017-08-12 NOTE — Telephone Encounter (Signed)
OK 

## 2017-08-12 NOTE — Telephone Encounter (Signed)
I called & advised daughter that it was fine to take GAS X as directed on box.

## 2017-08-12 NOTE — Telephone Encounter (Signed)
Patient daughter is calling to see if it is okay for patient to take GAS X  Please advise  Sharyn Lull [phone]

## 2017-08-12 NOTE — Telephone Encounter (Signed)
Please advise 

## 2017-08-26 ENCOUNTER — Telehealth: Payer: Self-pay | Admitting: Physician Assistant

## 2017-08-26 NOTE — Telephone Encounter (Signed)
[name] has been struggling with depression for a while.  Today, the symptoms were especially bad.  Her sister-in-law called because she has been crying and is very upset.  I spoke to both the patient and her sister-in-law by phone.  She has not seen Dr. Loanne Drilling in a long time, has not called him about this.  The sister-in-law states if [name] could relax and sleep, get something to calm her nerves, she feels that will help a great deal.  I advised that [name] can take over-the-counter medications such as melatonin or Benadryl to sleep, but that I was not able to prescribe nerve medicine or antidepressants.  [name] is also having pressure in her shoulder blades that she gets from gas, she states Gas-X is relieving this.  Suggested [name] and her family call Dr. Cordelia Pen office back and see if they can get some help.  If they are not able to get help from Dr. Cordelia Pen office, consider taking her to an Urgent Care so that she can be evaluated.  She does sound like she is very upset.  Diana Ferries, Diana Walters 08/26/2017 7:16 PM Beeper [phone]

## 2017-08-29 ENCOUNTER — Telehealth: Payer: Self-pay | Admitting: Internal Medicine

## 2017-08-29 NOTE — Progress Notes (Signed)
Pt scheduled for echo and appointment with Dr. Harrington Challenger on 09/06/17.  She is aware and in agreement with date/times.

## 2017-08-29 NOTE — Telephone Encounter (Signed)
Pt calling to ask what med she can't take to help with BM's, has been a few days. Asked is she called her PCP and they won't advise her due to her not being seen in over 3 years. pls call  [phone]

## 2017-08-29 NOTE — Telephone Encounter (Addendum)
Called patient. Advised she could take miralax to help with constipation.  No BM in last 3-4 days.  She is using GAS X because of a lot of gas pains.I strongly urged her to get back to primary care. She stated Dr. Loanne Drilling is not taking patients for primary care anymore, so she would need a referral to see him. Provided phone numbers for [facility] primary care and [facility] referral line. Diana Walters was present on the call and wrote the numbers. Pt audibly upset on the phone.  Uncomfortable with constipation and "I need something to help me deal with life". They are going to call referral lines for new PCP when we hung up.

## 2017-09-06 ENCOUNTER — Other Ambulatory Visit: Payer: Self-pay | Admitting: Internal Medicine

## 2017-09-06 ENCOUNTER — Encounter: Payer: Self-pay | Admitting: Internal Medicine

## 2017-09-06 ENCOUNTER — Other Ambulatory Visit: Payer: Self-pay | Admitting: *Deleted

## 2017-09-06 ENCOUNTER — Ambulatory Visit (HOSPITAL_BASED_OUTPATIENT_CLINIC_OR_DEPARTMENT_OTHER): Payer: Medicare Other

## 2017-09-06 ENCOUNTER — Other Ambulatory Visit: Payer: Self-pay

## 2017-09-06 ENCOUNTER — Encounter (INDEPENDENT_AMBULATORY_CARE_PROVIDER_SITE_OTHER): Payer: Self-pay

## 2017-09-06 ENCOUNTER — Ambulatory Visit (INDEPENDENT_AMBULATORY_CARE_PROVIDER_SITE_OTHER): Payer: Medicare Other | Admitting: Internal Medicine

## 2017-09-06 VITALS — BP 118/60 | HR 76 | Ht 70.0 in | Wt 130.0 lb

## 2017-09-06 DIAGNOSIS — S27892A Contusion of other specified intrathoracic organs, initial encounter: Secondary | ICD-10-CM | POA: Diagnosis not present

## 2017-09-06 DIAGNOSIS — Z8673 Personal history of transient ischemic attack (TIA), and cerebral infarction without residual deficits: Secondary | ICD-10-CM | POA: Insufficient documentation

## 2017-09-06 DIAGNOSIS — D649 Anemia, unspecified: Secondary | ICD-10-CM | POA: Diagnosis not present

## 2017-09-06 DIAGNOSIS — R011 Cardiac murmur, unspecified: Secondary | ICD-10-CM

## 2017-09-06 DIAGNOSIS — I110 Hypertensive heart disease with heart failure: Secondary | ICD-10-CM | POA: Insufficient documentation

## 2017-09-06 DIAGNOSIS — I10 Essential (primary) hypertension: Secondary | ICD-10-CM | POA: Diagnosis not present

## 2017-09-06 DIAGNOSIS — T82330A Leakage of aortic (bifurcation) graft (replacement), initial encounter: Secondary | ICD-10-CM | POA: Diagnosis not present

## 2017-09-06 DIAGNOSIS — E785 Hyperlipidemia, unspecified: Secondary | ICD-10-CM | POA: Insufficient documentation

## 2017-09-06 DIAGNOSIS — I5043 Acute on chronic combined systolic (congestive) and diastolic (congestive) heart failure: Secondary | ICD-10-CM | POA: Diagnosis not present

## 2017-09-06 DIAGNOSIS — N2581 Secondary hyperparathyroidism of renal origin: Secondary | ICD-10-CM | POA: Diagnosis not present

## 2017-09-06 DIAGNOSIS — J90 Pleural effusion, not elsewhere classified: Secondary | ICD-10-CM | POA: Diagnosis not present

## 2017-09-06 DIAGNOSIS — N185 Chronic kidney disease, stage 5: Secondary | ICD-10-CM | POA: Diagnosis not present

## 2017-09-06 DIAGNOSIS — N179 Acute kidney failure, unspecified: Secondary | ICD-10-CM | POA: Diagnosis not present

## 2017-09-06 DIAGNOSIS — I712 Thoracic aortic aneurysm, without rupture, unspecified: Secondary | ICD-10-CM

## 2017-09-06 DIAGNOSIS — I5022 Chronic systolic (congestive) heart failure: Secondary | ICD-10-CM

## 2017-09-06 DIAGNOSIS — T82898A Other specified complication of vascular prosthetic devices, implants and grafts, initial encounter: Secondary | ICD-10-CM | POA: Diagnosis not present

## 2017-09-06 DIAGNOSIS — I081 Rheumatic disorders of both mitral and tricuspid valves: Secondary | ICD-10-CM

## 2017-09-06 DIAGNOSIS — D62 Acute posthemorrhagic anemia: Secondary | ICD-10-CM | POA: Diagnosis not present

## 2017-09-06 DIAGNOSIS — R0602 Shortness of breath: Secondary | ICD-10-CM | POA: Diagnosis not present

## 2017-09-06 DIAGNOSIS — J45909 Unspecified asthma, uncomplicated: Secondary | ICD-10-CM

## 2017-09-06 DIAGNOSIS — D259 Leiomyoma of uterus, unspecified: Secondary | ICD-10-CM | POA: Diagnosis not present

## 2017-09-06 NOTE — Patient Instructions (Signed)
Your physician recommends that you continue on your current medications as directed. Please refer to the Current Medication list given to you today. Your physician recommends that you have blood work (cbc, bmet)---take prescription with you.

## 2017-09-06 NOTE — Progress Notes (Signed)
Cardiology Office Note   Date:  09/06/2017   ID:  Diana Walters, DOB Jul 21, 1955, MRN [id number]  PCP:  Renato Shin, MD  Cardiologist:   Dorris Carnes, MD    F/U of HTN and CHF      History of Present Illness: Diana Walters is a 62 y.o. female with a history of CVA  TEE showed small PFO  Alos history of thoracic aneurysm, s/p stent repair  Pt has hasitory of HTN She wa admitted to [facility] for acute systolic CHF and acute renal failure in April 2019   HR low   Pt on biPAP   Troponin peak at 0.21   Echo done showed LVEF 35 to 40%, down from previous She was seen in May and June by Buren Kos.  In May, pt had some volume overload   BP also up and amlodipiine added back to regimen  In June complained of joint pain  Some LE edema   Recomm f/u in advanced CHF clinic     The pt is not very active    Does complain of "gas like pain in between shoulder blades   Occasionally notices in chest    Takes Gas X   Lasts about 1 min.   Current Meds  Medication Sig  . acetaminophen (TYLENOL) 325 MG tablet Take 650 mg by mouth every 6 (six) hours as needed.  Marland Kitchen amLODipine (NORVASC) 5 MG tablet Take 1 tablet (5 mg total) by mouth daily.  Marland Kitchen aspirin EC 81 MG tablet Take 1 tablet (81 mg total) by mouth daily.  Marland Kitchen atorvastatin (LIPITOR) 40 MG tablet Take 1 tablet (40 mg total) by mouth daily.  . furosemide (LASIX) 40 MG tablet Take 1 tablet (40 mg total) by mouth daily.  . hydrALAZINE (APRESOLINE) 50 MG tablet Take 1.5 tablets (75 mg total) by mouth 3 (three) times daily.  . isosorbide mononitrate (IMDUR) 60 MG 24 hr tablet Take 1 tablet (60 mg total) by mouth daily.  . methotrexate 2.5 MG tablet Take 7.5 mg by mouth once a week. Friday  . metoprolol succinate (TOPROL-XL) 25 MG 24 hr tablet Take 25 mg by mouth daily. Take with or immediately following a meal. Take with 50 mg to equal 75 mg daily.  . Multiple Vitamin (MULTIVITAMIN) tablet Take 1 tablet by mouth daily.    . Simethicone (GAS-X PO) Take by  mouth as directed.  . sodium bicarbonate 650 MG tablet Take 650 mg by mouth 3 (three) times daily.     Allergies:   Codeine and Ibuprofen   Past Medical History:  Diagnosis Date  . ANEMIA 10/17/2009  . ANXIETY 10/28/2006  . Arthritis   . ASTHMA 10/28/2006   no inhalers per pt  . Blood transfusion without reported diagnosis   . Depression   . Duodenitis without mention of hemorrhage 2005  . Heart murmur   . HYPERCHOLESTEROLEMIA 04/01/2008  . HYPERTENSION 10/28/2006  . Menopause    age 26  . Osteoporosis    bil knees  . SEIZURE DISORDER 04/01/2008  . Stroke ([place]) 11/2012  . 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 ESOPHAGOGASTRODUODENOSCOPY  01/29/2004  . 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 LOOP RECORDER IMPLANT N/A 12/01/2012   Procedure: LOOP RECORDER IMPLANT;  Surgeon: Coralyn Mark, MD;  Location: [facility] CATH LAB;  Service: Cardiovascular;  Laterality: N/A;  .  TEE WITHOUT CARDIOVERSION N/A 12/01/2012   Procedure: TRANSESOPHAGEAL ECHOCARDIOGRAM (TEE);  Surgeon: Fay Records, MD;  Location: [facility] ENDOSCOPY;  Service: Cardiovascular;  Laterality: N/A;     Social History:  The patient  reports that she has been smoking cigarettes.  She has a 6.00 pack-year smoking history. She has never used smokeless tobacco. She reports that she drinks alcohol. She reports that she has current or past drug history. Drug: Marijuana.   Family History:  The patient's family history includes Breast cancer in her mother; Heart disease in her mother.    ROS:  Please see the history of present illness. All other systems are reviewed and  Negative to the above problem except as noted.    PHYSICAL EXAM: VS:  BP 118/60 (BP Location: Left Arm, Patient Position: Sitting, Cuff Size: Normal)   Pulse 76   Ht 5\' 10"  (1.778 m)   Wt 130 lb (59 kg)   SpO2 96%   BMI 18.65 kg/m   GEN: Thin 62 yo in no  acute distress  HEENT: normal  Neck: JVP normal   Cardiac: RRR; Gr II/VI systolic murmur base of heart  No S3, or S4    Respiratory:  clear to auscultation bilaterally, normal work of breathing GI: soft, nontender, nondistended, + BS  No hepatomegaly  MS: no deformity Moving all extremities   Skin: warm and dry, no rash Neuro:  Strength and sensation are intact Psych: euthymic mood, full affect   EKG:  EKG is not  ordered today   Lipid Panel    Component Value Date/Time   CHOL 162 05/18/2016 0859   TRIG 97 05/18/2016 0859   HDL 48 05/18/2016 0859   CHOLHDL 3.4 05/18/2016 0859   CHOLHDL 3 12/20/2013 1016   VLDL 20.2 12/20/2013 1016   LDLCALC 95 05/18/2016 0859   LDLDIRECT 74.3 11/27/2010 1058      Wt Readings from Last 3 Encounters:  09/06/17 130 lb (59 kg)  08/04/17 133 lb 12.8 oz (60.7 kg)  07/15/17 141 lb (64 kg)      ASSESSMENT AND PLAN:   1  CHF   Volume status is OK   Echo today shows LVEF is back to normal  Vigorous   I would follow volume   I would not plan further testing for now  2  Back pain   Not clear what origin is   ? GI   LVEF has improved  I am not convinced that it represents Angina   Follow  1  HTN  BP is good   Keep on meds    2  HL   LAast LDL 95 lasat sprin   Will follow on nex visit     3  CVA  Has LINQ  On plavix    4  S/p  AAA repair  With endolead Followed by Pierre Bali. With CT scans periodically  Check BMET and CBC today   Current medicines are reviewed at length with the patient today.  The patient does not have concerns regarding medicines.  Signed, Dorris Carnes, MD  09/06/2017 11:20 AM    [address], [place], [place]  [address] Phone: [phone]; Fax: [phone]

## 2017-09-07 ENCOUNTER — Other Ambulatory Visit: Payer: Self-pay | Admitting: Nephrology

## 2017-09-07 ENCOUNTER — Inpatient Hospital Stay (HOSPITAL_COMMUNITY)
Admission: EM | Admit: 2017-09-07 | Discharge: 2017-09-23 | DRG: 220 | Disposition: A | Discharge status: Home Health | Payer: Medicare Other | Attending: Internal Medicine | Admitting: Internal Medicine

## 2017-09-07 ENCOUNTER — Ambulatory Visit
Admission: RE | Admit: 2017-09-07 | Discharge: 2017-09-07 | Disposition: A | Discharge status: Home / Self Care | Payer: Medicare Other | Source: Ambulatory Visit | Attending: Nephrology | Admitting: Nephrology

## 2017-09-07 ENCOUNTER — Other Ambulatory Visit: Payer: Self-pay

## 2017-09-07 ENCOUNTER — Encounter (HOSPITAL_COMMUNITY): Payer: Self-pay | Admitting: Emergency Medicine

## 2017-09-07 ENCOUNTER — Emergency Department (HOSPITAL_COMMUNITY): Payer: Medicare Other

## 2017-09-07 DIAGNOSIS — Y832 Surgical operation with anastomosis, bypass or graft as the cause of abnormal reaction of the patient, or of later complication, without mention of misadventure at the time of the procedure: Secondary | ICD-10-CM | POA: Diagnosis present

## 2017-09-07 DIAGNOSIS — R0602 Shortness of breath: Secondary | ICD-10-CM | POA: Diagnosis not present

## 2017-09-07 DIAGNOSIS — T82330A Leakage of aortic (bifurcation) graft (replacement), initial encounter: Principal | ICD-10-CM | POA: Diagnosis present

## 2017-09-07 DIAGNOSIS — S27892A Contusion of other specified intrathoracic organs, initial encounter: Secondary | ICD-10-CM | POA: Diagnosis present

## 2017-09-07 DIAGNOSIS — T82898A Other specified complication of vascular prosthetic devices, implants and grafts, initial encounter: Secondary | ICD-10-CM | POA: Diagnosis not present

## 2017-09-07 DIAGNOSIS — R634 Abnormal weight loss: Secondary | ICD-10-CM | POA: Diagnosis present

## 2017-09-07 DIAGNOSIS — Z8679 Personal history of other diseases of the circulatory system: Secondary | ICD-10-CM

## 2017-09-07 DIAGNOSIS — D510 Vitamin B12 deficiency anemia due to intrinsic factor deficiency: Secondary | ICD-10-CM | POA: Diagnosis not present

## 2017-09-07 DIAGNOSIS — M1610 Unilateral primary osteoarthritis, unspecified hip: Secondary | ICD-10-CM | POA: Diagnosis present

## 2017-09-07 DIAGNOSIS — Z803 Family history of malignant neoplasm of breast: Secondary | ICD-10-CM

## 2017-09-07 DIAGNOSIS — I712 Thoracic aortic aneurysm, without rupture, unspecified: Secondary | ICD-10-CM | POA: Diagnosis present

## 2017-09-07 DIAGNOSIS — R011 Cardiac murmur, unspecified: Secondary | ICD-10-CM | POA: Diagnosis present

## 2017-09-07 DIAGNOSIS — J449 Chronic obstructive pulmonary disease, unspecified: Secondary | ICD-10-CM | POA: Diagnosis present

## 2017-09-07 DIAGNOSIS — M1711 Unilateral primary osteoarthritis, right knee: Secondary | ICD-10-CM | POA: Diagnosis present

## 2017-09-07 DIAGNOSIS — N185 Chronic kidney disease, stage 5: Secondary | ICD-10-CM | POA: Diagnosis not present

## 2017-09-07 DIAGNOSIS — E7800 Pure hypercholesterolemia, unspecified: Secondary | ICD-10-CM | POA: Diagnosis present

## 2017-09-07 DIAGNOSIS — R110 Nausea: Secondary | ICD-10-CM | POA: Diagnosis not present

## 2017-09-07 DIAGNOSIS — F1210 Cannabis abuse, uncomplicated: Secondary | ICD-10-CM | POA: Diagnosis present

## 2017-09-07 DIAGNOSIS — Z886 Allergy status to analgesic agent status: Secondary | ICD-10-CM

## 2017-09-07 DIAGNOSIS — I120 Hypertensive chronic kidney disease with stage 5 chronic kidney disease or end stage renal disease: Secondary | ICD-10-CM | POA: Diagnosis not present

## 2017-09-07 DIAGNOSIS — E538 Deficiency of other specified B group vitamins: Secondary | ICD-10-CM | POA: Diagnosis not present

## 2017-09-07 DIAGNOSIS — Z716 Tobacco abuse counseling: Secondary | ICD-10-CM

## 2017-09-07 DIAGNOSIS — D649 Anemia, unspecified: Secondary | ICD-10-CM | POA: Diagnosis not present

## 2017-09-07 DIAGNOSIS — D72829 Elevated white blood cell count, unspecified: Secondary | ICD-10-CM | POA: Diagnosis present

## 2017-09-07 DIAGNOSIS — G8929 Other chronic pain: Secondary | ICD-10-CM | POA: Diagnosis present

## 2017-09-07 DIAGNOSIS — R079 Chest pain, unspecified: Secondary | ICD-10-CM | POA: Diagnosis not present

## 2017-09-07 DIAGNOSIS — Z7982 Long term (current) use of aspirin: Secondary | ICD-10-CM

## 2017-09-07 DIAGNOSIS — G40909 Epilepsy, unspecified, not intractable, without status epilepticus: Secondary | ICD-10-CM | POA: Diagnosis present

## 2017-09-07 DIAGNOSIS — Z8249 Family history of ischemic heart disease and other diseases of the circulatory system: Secondary | ICD-10-CM

## 2017-09-07 DIAGNOSIS — Z6821 Body mass index (BMI) 21.0-21.9, adult: Secondary | ICD-10-CM

## 2017-09-07 DIAGNOSIS — D509 Iron deficiency anemia, unspecified: Secondary | ICD-10-CM | POA: Diagnosis present

## 2017-09-07 DIAGNOSIS — R001 Bradycardia, unspecified: Secondary | ICD-10-CM | POA: Diagnosis not present

## 2017-09-07 DIAGNOSIS — N179 Acute kidney failure, unspecified: Secondary | ICD-10-CM | POA: Diagnosis not present

## 2017-09-07 DIAGNOSIS — J45909 Unspecified asthma, uncomplicated: Secondary | ICD-10-CM | POA: Diagnosis present

## 2017-09-07 DIAGNOSIS — F17210 Nicotine dependence, cigarettes, uncomplicated: Secondary | ICD-10-CM | POA: Diagnosis present

## 2017-09-07 DIAGNOSIS — Z8673 Personal history of transient ischemic attack (TIA), and cerebral infarction without residual deficits: Secondary | ICD-10-CM

## 2017-09-07 DIAGNOSIS — E1122 Type 2 diabetes mellitus with diabetic chronic kidney disease: Secondary | ICD-10-CM | POA: Diagnosis present

## 2017-09-07 DIAGNOSIS — N184 Chronic kidney disease, stage 4 (severe): Secondary | ICD-10-CM | POA: Diagnosis present

## 2017-09-07 DIAGNOSIS — D631 Anemia in chronic kidney disease: Secondary | ICD-10-CM | POA: Diagnosis present

## 2017-09-07 DIAGNOSIS — IMO0001 Reserved for inherently not codable concepts without codable children: Secondary | ICD-10-CM

## 2017-09-07 DIAGNOSIS — E785 Hyperlipidemia, unspecified: Secondary | ICD-10-CM | POA: Diagnosis present

## 2017-09-07 DIAGNOSIS — F329 Major depressive disorder, single episode, unspecified: Secondary | ICD-10-CM | POA: Diagnosis present

## 2017-09-07 DIAGNOSIS — M810 Age-related osteoporosis without current pathological fracture: Secondary | ICD-10-CM | POA: Diagnosis present

## 2017-09-07 DIAGNOSIS — Z79899 Other long term (current) drug therapy: Secondary | ICD-10-CM

## 2017-09-07 DIAGNOSIS — R52 Pain, unspecified: Secondary | ICD-10-CM

## 2017-09-07 DIAGNOSIS — E875 Hyperkalemia: Secondary | ICD-10-CM | POA: Diagnosis present

## 2017-09-07 DIAGNOSIS — Z95828 Presence of other vascular implants and grafts: Secondary | ICD-10-CM

## 2017-09-07 DIAGNOSIS — I729 Aneurysm of unspecified site: Secondary | ICD-10-CM

## 2017-09-07 DIAGNOSIS — R490 Dysphonia: Secondary | ICD-10-CM | POA: Diagnosis not present

## 2017-09-07 DIAGNOSIS — E778 Other disorders of glycoprotein metabolism: Secondary | ICD-10-CM | POA: Diagnosis not present

## 2017-09-07 DIAGNOSIS — M25462 Effusion, left knee: Secondary | ICD-10-CM | POA: Diagnosis present

## 2017-09-07 DIAGNOSIS — D62 Acute posthemorrhagic anemia: Secondary | ICD-10-CM | POA: Diagnosis present

## 2017-09-07 DIAGNOSIS — K5900 Constipation, unspecified: Secondary | ICD-10-CM | POA: Diagnosis not present

## 2017-09-07 DIAGNOSIS — M7989 Other specified soft tissue disorders: Secondary | ICD-10-CM | POA: Diagnosis not present

## 2017-09-07 DIAGNOSIS — Z992 Dependence on renal dialysis: Secondary | ICD-10-CM

## 2017-09-07 DIAGNOSIS — Z885 Allergy status to narcotic agent status: Secondary | ICD-10-CM

## 2017-09-07 DIAGNOSIS — N2581 Secondary hyperparathyroidism of renal origin: Secondary | ICD-10-CM | POA: Diagnosis not present

## 2017-09-07 DIAGNOSIS — Z9119 Patient's noncompliance with other medical treatment and regimen: Secondary | ICD-10-CM

## 2017-09-07 DIAGNOSIS — M069 Rheumatoid arthritis, unspecified: Secondary | ICD-10-CM | POA: Diagnosis present

## 2017-09-07 DIAGNOSIS — E872 Acidosis: Secondary | ICD-10-CM | POA: Diagnosis not present

## 2017-09-07 LAB — CBC WITH DIFFERENTIAL/PLATELET
Basophils Absolute: 0 10*3/uL (ref 0.0–0.1)
Basophils Relative: 0 %
Eosinophils Absolute: 0.1 10*3/uL (ref 0.0–0.7)
Eosinophils Relative: 1 %
HCT: 17.5 % — ABNORMAL LOW (ref 36.0–46.0)
HEMOGLOBIN: 5.1 g/dL — AB (ref 12.0–15.0)
LYMPHS PCT: 14 %
Lymphs Abs: 1.4 10*3/uL (ref 0.7–4.0)
MCH: 25.8 pg — ABNORMAL LOW (ref 26.0–34.0)
MCHC: 29.1 g/dL — ABNORMAL LOW (ref 30.0–36.0)
MCV: 88.4 fL (ref 78.0–100.0)
MONOS PCT: 8 %
Monocytes Absolute: 0.8 10*3/uL (ref 0.1–1.0)
NEUTROS PCT: 77 %
Neutro Abs: 7.8 10*3/uL — ABNORMAL HIGH (ref 1.7–7.7)
Platelets: 539 10*3/uL — ABNORMAL HIGH (ref 150–400)
RBC: 1.98 MIL/uL — ABNORMAL LOW (ref 3.87–5.11)
RDW: 23 % — ABNORMAL HIGH (ref 11.5–15.5)
WBC: 10.1 10*3/uL (ref 4.0–10.5)

## 2017-09-07 LAB — COMPREHENSIVE METABOLIC PANEL
ALBUMIN: 2.7 g/dL — AB (ref 3.5–5.0)
ALK PHOS: 57 U/L (ref 38–126)
ALT: 12 U/L (ref 0–44)
ANION GAP: 12 (ref 5–15)
AST: 19 U/L (ref 15–41)
BUN: 54 mg/dL — ABNORMAL HIGH (ref 8–23)
CO2: 20 mmol/L — AB (ref 22–32)
Calcium: 8.6 mg/dL — ABNORMAL LOW (ref 8.9–10.3)
Chloride: 108 mmol/L (ref 98–111)
Creatinine, Ser: 3.33 mg/dL — ABNORMAL HIGH (ref 0.44–1.00)
GFR calc Af Amer: 16 mL/min — ABNORMAL LOW (ref >60)
GFR calc non Af Amer: 14 mL/min — ABNORMAL LOW (ref >60)
Glucose, Bld: 87 mg/dL (ref 70–99)
Potassium: 4.5 mmol/L (ref 3.5–5.1)
SODIUM: 140 mmol/L (ref 135–145)
TOTAL PROTEIN: 7.9 g/dL (ref 6.5–8.1)
Total Bilirubin: 0.5 mg/dL (ref 0.3–1.2)

## 2017-09-07 LAB — RETICULOCYTES
RBC.: 1.99 MIL/uL — ABNORMAL LOW (ref 3.87–5.11)
RETIC COUNT ABSOLUTE: 73.6 10*3/uL (ref 19.0–186.0)
Retic Ct Pct: 3.7 % — ABNORMAL HIGH (ref 0.4–3.1)

## 2017-09-07 LAB — POC OCCULT BLOOD, ED: FECAL OCCULT BLD: NEGATIVE

## 2017-09-07 LAB — PROTIME-INR
INR: 1.09
Prothrombin Time: 14 seconds (ref 11.4–15.2)

## 2017-09-07 LAB — APTT: aPTT: 27 seconds (ref 24–36)

## 2017-09-07 LAB — PREPARE RBC (CROSSMATCH)

## 2017-09-07 MED ORDER — SODIUM CHLORIDE 0.9 % IV SOLN
INTRAVENOUS | Status: DC
  Administered 2017-09-07 – 2017-09-08 (×2): via INTRAVENOUS

## 2017-09-07 MED ORDER — FENTANYL CITRATE (PF) 100 MCG/2ML IJ SOLN
50.0000 ug | Freq: Once | INTRAMUSCULAR | Status: AC
  Administered 2017-09-07: 50 ug via INTRAVENOUS
  Filled 2017-09-07: qty 2

## 2017-09-07 MED ORDER — SODIUM CHLORIDE 0.9% IV SOLUTION
Freq: Once | INTRAVENOUS | Status: AC
  Administered 2017-09-07: 22:00:00 via INTRAVENOUS

## 2017-09-07 NOTE — ED Notes (Signed)
No blood draw at this time,   Pt receiving blood transfusion. 

## 2017-09-07 NOTE — ED Notes (Signed)
Pt reports going to the doctor and being told her blood levels were low and to come to the hospital. Pt reported her hemoglobin was low.

## 2017-09-07 NOTE — ED Triage Notes (Signed)
Patient advised by her doctor to go to ER for blood transfusion , her HGB= 4.6 this morning , pt. denies bleeding , she added upper back pain / no injury.

## 2017-09-07 NOTE — ED Provider Notes (Addendum)
[facility] EMERGENCY DEPARTMENT Provider Note   CSN: [id number] Arrival date & time: 09/07/17  1912     History   Chief Complaint Chief Complaint  Patient presents with  . Needs Blood Transfusion    HPI Diana Walters is a 62 y.o. female.  HPI  Patient is a 62 year old female with a history of CKD stage III, CVA, thoracic aortic aneurysm repair (>10 years ago at [facility]), heart failure preserved ejection fraction, hypercholesterolemia, hypertension presenting for low hemoglobin.  Patient reports that she was at her nephrologist office at [facility] today, when they instructed her to come to the emergency department after getting routine lab work showing that her hemoglobin was 4.6.  Patient reports that she has a history of transfusion before, when she was in the hospital for respiratory failure and April 2019.  Patient reports that she has seen some darker colored stools over the past several weeks, but no melena.  Patient reports that she has had thoracic back pain is made her short of breath over the past 2 to 3 weeks.  Patient denies trauma.  Patient denies any hematemesis, hematuria, or vaginal bleeding.  No fever or chills.  No chest pain at present.  Patient also notes that her left arm has been more swollen compared to the right over the past 2 weeks.  Past Medical History:  Diagnosis Date  . ANEMIA 10/17/2009  . ANXIETY 10/28/2006  . Arthritis   . ASTHMA 10/28/2006   no inhalers per pt  . Blood transfusion without reported diagnosis   . Depression   . Duodenitis without mention of hemorrhage 2005  . Heart murmur   . HYPERCHOLESTEROLEMIA 04/01/2008  . HYPERTENSION 10/28/2006  . Menopause    age 85  . Osteoporosis    bil knees  . SEIZURE DISORDER 04/01/2008  . Stroke ([place]) 11/2012  . THORACIC AORTIC ANEURYSM 04/01/2008    Patient Active Problem List   Diagnosis Date Noted  . Tobacco abuse 08/03/2017  . Elevated troponin 06/10/2017  . Acute systolic  CHF (congestive heart failure) ([place]) 06/10/2017  . CKD (chronic kidney disease) stage 4, GFR 15-29 ml/min (HCC) 06/10/2017  . Anemia of chronic disease 06/10/2017  . Bradycardia 06/10/2017  . Acute respiratory failure ([place]) 06/05/2017  . Seizure disorder ([place]) 06/07/2016  . Hypertensive cardiomegaly 11/03/2013  . History of stroke 11/03/2013  . Heart murmur, systolic 81/15/7262  . Encounter for screening colonoscopy 09/12/2013  . Goiter, simple 07/06/2013  . Routine general medical examination at a health care facility 07/06/2013  . Acute CVA (cerebrovascular accident) ([place]) 11/29/2012  . Iron deficiency anemia, unspecified 06/16/2012  . Encounter for long-term current use of medication 05/17/2012  . Arthralgia 05/17/2012  . Menopause 11/27/2010  . Hematuria 11/27/2010  . KNEE PAIN, LEFT 02/18/2010  . HYPERCHOLESTEROLEMIA 04/01/2008  . 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 Convulsions ([place]) 04/01/2008  . C O P D 05/29/2007  . PRURITUS 05/25/2007  . ANXIETY 10/28/2006  . Essential hypertension 10/28/2006  . ASTHMA 10/28/2006  . Rheumatoid arthritis ([place]) 10/28/2006    Past Surgical History:  Procedure Laterality Date  . ESOPHAGOGASTRODUODENOSCOPY  01/29/2004  . 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 LOOP RECORDER IMPLANT N/A 12/01/2012   Procedure: LOOP RECORDER IMPLANT;  Surgeon: Coralyn Mark, MD;  Location: [facility] CATH LAB;  Service: Cardiovascular;  Laterality: N/A;  . TEE WITHOUT CARDIOVERSION N/A 12/01/2012   Procedure: TRANSESOPHAGEAL ECHOCARDIOGRAM (  TEE);  Surgeon: Fay Records, MD;  Location: [facility] ENDOSCOPY;  Service: Cardiovascular;  Laterality: N/A;     OB History   None      Home Medications    Prior to Admission medications   Medication Sig Start Date End Date Taking? Authorizing Provider  acetaminophen (TYLENOL) 325 MG tablet Take 650 mg by mouth every 6 (six) hours as needed.    [provider]  amLODipine (NORVASC) 5 MG tablet Take 1 tablet (5 mg total) by mouth daily. 07/05/17 10/03/17  Daune Perch, NP  aspirin EC 81 MG tablet Take 1 tablet (81 mg total) by mouth daily. 06/22/17   Daune Perch, NP  atorvastatin (LIPITOR) 40 MG tablet Take 1 tablet (40 mg total) by mouth daily. 06/22/17   Daune Perch, NP  furosemide (LASIX) 40 MG tablet Take 1 tablet (40 mg total) by mouth daily. 06/22/17   Daune Perch, NP  hydrALAZINE (APRESOLINE) 50 MG tablet Take 1.5 tablets (75 mg total) by mouth 3 (three) times daily. 06/22/17   Daune Perch, NP  isosorbide mononitrate (IMDUR) 60 MG 24 hr tablet Take 1 tablet (60 mg total) by mouth daily. 06/22/17   Daune Perch, NP  methotrexate 2.5 MG tablet Take 7.5 mg by mouth once a week. Friday    [provider]  metoprolol succinate (TOPROL-XL) 25 MG 24 hr tablet Take 25 mg by mouth daily. Take with or immediately following a meal. Take with 50 mg to equal 75 mg daily.    [provider]  Multiple Vitamin (MULTIVITAMIN) tablet Take 1 tablet by mouth daily.      [provider]  Simethicone (GAS-X PO) Take by mouth as directed.    [provider]  sodium bicarbonate 650 MG tablet Take 650 mg by mouth 3 (three) times daily. 08/19/17   [provider]    Family History Family History  Problem Relation Age of Onset  . Breast cancer Mother   . Heart disease Mother        before age 35  . Colon cancer Neg Hx   . Esophageal cancer Neg Hx   . Pancreatic cancer Neg Hx   . Rectal cancer Neg Hx   . Stomach cancer Neg Hx     Social History Social History   Tobacco Use  . Smoking status: Current Every Day Smoker    Packs/day: 0.50    Years: 12.00    Pack years: 6.00    Types: Cigarettes  . Smokeless tobacco: Never Used  Substance Use Topics  . Alcohol use: Yes    Alcohol/week: 0.0 oz  . Drug use: Yes    Types: Marijuana     Allergies   Codeine and Ibuprofen   Review of Systems Review  of Systems  Constitutional: Negative for chills and fever.  HENT: Negative for congestion and sore throat.   Eyes: Negative for visual disturbance.  Respiratory: Positive for shortness of breath. Negative for chest tightness.   Cardiovascular: Negative for chest pain.  Gastrointestinal: Negative for abdominal pain, nausea and vomiting.  Genitourinary: Negative for dysuria and flank pain.  Musculoskeletal: Positive for back pain. Negative for myalgias.  Skin: Negative for rash.  Neurological: Negative for dizziness, syncope, light-headedness and headaches.     Physical Exam Updated Vital Signs BP 138/63   Pulse (!) 25   Temp 98.7 F (37.1 C) (Oral)   Resp (!) 21   Ht 5\' 10"  (1.778 m)   Wt 60.3 kg (133  lb)   SpO2 100%   BMI 19.08 kg/m   Physical Exam  Constitutional: She appears well-developed and well-nourished.  Poorly nourished, appearing older than stated age.  HENT:  Head: Normocephalic and atraumatic.  Mouth/Throat: Oropharynx is clear and moist.  Eyes: Pupils are equal, round, and reactive to light. Conjunctivae and EOM are normal.  Neck: Normal range of motion. Neck supple.  Cardiovascular: Normal rate, regular rhythm, S1 normal and S2 normal.  No murmur heard. Pulses:      Radial pulses are 2+ on the right side, and 2+ on the left side.       Dorsalis pedis pulses are 2+ on the right side, and 2+ on the left side.  Pulmonary/Chest:  Tachypnea noted.  Diminished breath sounds in bilateral lung bases.  Abdominal: Soft. She exhibits no distension. There is no tenderness. There is no guarding.  Musculoskeletal: Normal range of motion. She exhibits no edema or deformity.  Slightly more edematous LUE than RUE. Nonpitting.  Lymphadenopathy:    She has no cervical adenopathy.  Neurological: She is alert.  Cranial nerves grossly intact. Patient moves extremities symmetrically and with good coordination.  Skin: Skin is warm and dry. No rash noted. No erythema.    Psychiatric: She has a normal mood and affect. Her behavior is normal. Judgment and thought content normal.  Nursing note and vitals reviewed.    ED Treatments / Results  Labs (all labs ordered are listed, but only abnormal results are displayed) Labs Reviewed  CBC WITH DIFFERENTIAL/PLATELET - Abnormal; Notable for the following components:      Result Value   RBC 1.98 (*)    Hemoglobin 5.1 (*)    HCT 17.5 (*)    MCH 25.8 (*)    MCHC 29.1 (*)    RDW 23.0 (*)    Platelets 539 (*)    Neutro Abs 7.8 (*)    All other components within normal limits  COMPREHENSIVE METABOLIC PANEL - Abnormal; Notable for the following components:   CO2 20 (*)    BUN 54 (*)    Creatinine, Ser 3.33 (*)    Calcium 8.6 (*)    Albumin 2.7 (*)    GFR calc non Af Amer 14 (*)    GFR calc Af Amer 16 (*)    All other components within normal limits  PROTIME-INR  APTT  VITAMIN B12  FOLATE  IRON AND TIBC  FERRITIN  RETICULOCYTES  URINALYSIS, ROUTINE W REFLEX MICROSCOPIC  TROPONIN I  POC OCCULT BLOOD, ED  TYPE AND SCREEN  PREPARE RBC (CROSSMATCH)    EKG None  ED ECG REPORT   Date: 09/08/2017  Rate: 51   Rhythm: normal sinus rhythm  QRS Axis: normal  Intervals: normal  ST/T Wave abnormalities: nonspecific T wave changes  Conduction Disutrbances:Atrial premature complexes in couplets.  Narrative Interpretation:   Old EKG Reviewed: changes noted    Radiology Dg Chest 2 View  Result Date: 09/07/2017 CLINICAL DATA:  Pain between shoulders EXAM: CHEST - 2 VIEW COMPARISON:  06/09/2017 FINDINGS: Cardiac shadow is again enlarged. Loop recorder is noted. Postsurgical changes are seen. Thoracic aortic stent is again identified. Increased soft tissue density is noted along the aortic arch portion of the stent graft new from the prior exam. The patient positioning somewhat different than that seen on the prior exam although this increased soft tissue density is suspicious given the patient's clinical  history of back pain. CT is recommended for further evaluation. The lungs are clear  bilaterally. Postsurgical changes in the right subclavicular area are noted. No bony abnormality is seen. IMPRESSION: Aortic stent graft is stable in appearance although increased soft tissue density is noted superior to the aortic arch portion of the graft. Given the patient's history of chest pain this is somewhat suspicious for possible expanding aneurysm. CT of the chest (ideally with contrast) is recommended for further evaluation. These results will be called to the ordering clinician or representative by the Radiologist Assistant, and communication documented in the PACS or zVision Dashboard. Electronically Signed   By: Inez Catalina M.D.   On: 09/07/2017 14:16    Procedures Procedures (including critical care time)  CRITICAL CARE Performed by: Albesa Seen   Total critical care time: 35 minutes  Critical care time was exclusive of separately billable procedures and treating other patients.  Critical care was necessary to treat or prevent imminent or life-threatening deterioration.  Critical care was time spent personally by me on the following activities: development of treatment plan with patient and/or surrogate as well as nursing, discussions with consultants, evaluation of patient's response to treatment, examination of patient, obtaining history from patient or surrogate, ordering and performing treatments and interventions, ordering and review of laboratory studies, ordering and review of radiographic studies, pulse oximetry and re-evaluation of patient's condition.   Medications Ordered in ED Medications  0.9 %  sodium chloride infusion ( Intravenous New Bag/Given 09/07/17 2134)  fentaNYL (SUBLIMAZE) injection 50 mcg (has no administration in time range)  0.9 %  sodium chloride infusion (Manually program via Guardrails IV Fluids) ( Intravenous New Bag/Given 09/07/17 2145)     Initial  Impression / Assessment and Plan / ED Course  I have reviewed the triage vital signs and the nursing notes.  Pertinent labs & imaging results that were available during my care of the patient were reviewed by me and considered in my medical decision making (see chart for details).  Clinical Course as of Sep 08 217  Wed Sep 07, 2017  2140 Noted. Blood products ordered from triage. Will transfuse each unit over 1 hour d/t hx of CHF.  Hemoglobin(!!): 5.1 [AM]  2318 Spoke with CT tech who will talk to radiology regarding performing CTA with patient's kidney function. Will talk to radiology about reduced dose.   [AM]  2319 CT Angio Chest/Abd/Pel for Dissection W and/or W/WO [AM]  2320 Noted findings of soft tissue changes around patient's aortic graft.  Given the clinical picture, do need to emergently assess with CTA.  DG Chest 2 View [AM]  2327 Fax of CXR report as well as clinical concern was just received from off the printer.  The information that patient had a chest x-ray earlier today was not relayed on patient's initial presentation.   [AM]  2353 Spoke with CT tech now that patient has IV in right Ac, can go to CT.   [AM]  Thu Sep 08, 2017  0036 Spoke with Dr. Moshe Cipro of nephrology, who states that she does not recommend any further course of action at this time for bolusing patient after the CTA.  She recommends pursuing the emergent situation at hand and they will address renal function inpatient.  I appreciate Dr. Shelva Majestic involvement in the care of this patient.   [AM]  0038 Negative troponin noted.  This was collected while the blood transfusion was running.  Attending physician Dr. Tyrone Nine and I note that there could be false results as a result of this. Will repeat.  Troponin i,  poc: 0.00 [AM]  95 Spoke with cardiology who denies concerns about underlying conduction disease.  Does not feel that patient's EKG is consistent with Mobitz II block. Feels that patient has slow  sinus rhythm with superimposed PACs.    [AM]  [phone] Spoke with Dr. Gerilyn Nestle of radiology. Increased clot around graft and exterior to the graft per Dr. Minette Brine of radiology. No obvious contrast extravasation. Inferior of the arch. Appears to be active endo leak with clot extending out of graft.  I appreciate Dr. Minette Brine' involvement in reviewing the CT scan.   [AM]  0123 Spoke with Dr. Donzetta Matters of vascular surgery, who will come to evaluate the patient.  I appreciate his involvement in the care of this patient.  Recommends consulting critical care for ICU admission for close monitoring.   [AM]  [phone] Spoke with Dr. Jimmy Footman of critical care who recommends re-consulting if needed after his evaluation. I appreciate Dr. Deterding's involvement in the care of this patient.   [AM]  0215 Dr. Donzetta Matters at bedside with patient, and recommends transport to [facility]. He will initiate transfer line discussion to [facility] vascular surgery team. I appreciate Dr. Claretha Cooper involvement in the care of this patient.    [AM]    Clinical Course User Index [AM] Albesa Seen, PA-C    Patient does have evidence of active endoleak on CT angiogram, reviewed by Dr. Donzetta Matters.  Initially, patient may have been transferred to [facility], however patient decided to remain at [facility].  Patient to be taken the operating room by Dr. Donzetta Matters tomorrow.  Requests critical care admission. Patient remain n.p.o. at midnight.  Patient received 2 units of packed red blood cells in the emergency department, and remains hemodynamically stable.  Repeat H & H pending. Pain from patient's rheumatoid arthritis and from the aneurysm are controlled at this time.  Patient updated on the plan of care.  This is a shared visit with Dr. Deno Etienne. Patient was independently evaluated by this attending physician. Attending physician consulted in evaluation and admission management.  Final Clinical Impressions(s) / ED Diagnoses   Final diagnoses:  Symptomatic anemia    Endoleak post endovascular aneurysm repair, initial encounter [facility])    ED Discharge Orders    None       Albesa Seen, PA-C 09/08/17 0304    Langston Masker B, PA-C 09/08/17 [place], DO 09/08/17 1548

## 2017-09-07 NOTE — ED Provider Notes (Addendum)
Patient placed in Quick Look pathway, seen and evaluated   Chief Complaint: anemia  HPI:   Diana Walters is a 62 y.o. female who presents to the ED for anemia and back pain. Patient reports that her back has been hurting for 2 weeks. She went to her PCP today and they are not sure what the back pain is from but they told her she was severely anemic and needed to come to the ED for a blood transfusion.  ROS: M/S: back pain  Physical Exam:  BP (!) 145/71 (BP Location: Right Arm)   Pulse 76   Temp 98.8 F (37.1 C) (Oral)   Resp 16   Ht 5\' 10"  (1.778 m)   Wt 60.3 kg (133 lb)   SpO2 100%   BMI 19.08 kg/m    Gen: appears uncomfortable  Neuro: Awake and Alert  Heart: normal rate   Initiation of care has begun. The patient has been counseled on the process, plan, and necessity for staying for the completion/evaluation, and the remainder of the medical screening examination    Ashley Murrain, NP 09/07/17 1933    Debroah Baller [place], NP 09/10/17 1613    Virgel Manifold, MD 09/11/17 [phone]

## 2017-09-07 NOTE — ED Notes (Signed)
Occult card at bedside 

## 2017-09-08 ENCOUNTER — Inpatient Hospital Stay (HOSPITAL_COMMUNITY): Payer: Medicare Other | Admitting: Anesthesiology

## 2017-09-08 ENCOUNTER — Inpatient Hospital Stay (HOSPITAL_COMMUNITY): Payer: Medicare Other

## 2017-09-08 ENCOUNTER — Encounter (HOSPITAL_COMMUNITY)
Admission: EM | Disposition: A | Discharge status: Home Health | Payer: Self-pay | Source: Home / Self Care | Attending: Internal Medicine

## 2017-09-08 ENCOUNTER — Encounter (HOSPITAL_COMMUNITY): Payer: Self-pay

## 2017-09-08 ENCOUNTER — Other Ambulatory Visit: Payer: Self-pay

## 2017-09-08 ENCOUNTER — Emergency Department (HOSPITAL_COMMUNITY): Payer: Medicare Other

## 2017-09-08 DIAGNOSIS — Z79899 Other long term (current) drug therapy: Secondary | ICD-10-CM | POA: Diagnosis not present

## 2017-09-08 DIAGNOSIS — E8770 Fluid overload, unspecified: Secondary | ICD-10-CM | POA: Diagnosis not present

## 2017-09-08 DIAGNOSIS — N179 Acute kidney failure, unspecified: Secondary | ICD-10-CM | POA: Diagnosis not present

## 2017-09-08 DIAGNOSIS — N186 End stage renal disease: Secondary | ICD-10-CM | POA: Diagnosis not present

## 2017-09-08 DIAGNOSIS — M25462 Effusion, left knee: Secondary | ICD-10-CM | POA: Diagnosis not present

## 2017-09-08 DIAGNOSIS — I130 Hypertensive heart and chronic kidney disease with heart failure and stage 1 through stage 4 chronic kidney disease, or unspecified chronic kidney disease: Secondary | ICD-10-CM | POA: Diagnosis not present

## 2017-09-08 DIAGNOSIS — I132 Hypertensive heart and chronic kidney disease with heart failure and with stage 5 chronic kidney disease, or end stage renal disease: Secondary | ICD-10-CM | POA: Diagnosis not present

## 2017-09-08 DIAGNOSIS — D649 Anemia, unspecified: Secondary | ICD-10-CM | POA: Diagnosis present

## 2017-09-08 DIAGNOSIS — Z7982 Long term (current) use of aspirin: Secondary | ICD-10-CM | POA: Diagnosis not present

## 2017-09-08 DIAGNOSIS — I5021 Acute systolic (congestive) heart failure: Secondary | ICD-10-CM | POA: Diagnosis not present

## 2017-09-08 DIAGNOSIS — I120 Hypertensive chronic kidney disease with stage 5 chronic kidney disease or end stage renal disease: Secondary | ICD-10-CM | POA: Diagnosis present

## 2017-09-08 DIAGNOSIS — K922 Gastrointestinal hemorrhage, unspecified: Secondary | ICD-10-CM | POA: Diagnosis not present

## 2017-09-08 DIAGNOSIS — Z452 Encounter for adjustment and management of vascular access device: Secondary | ICD-10-CM | POA: Diagnosis not present

## 2017-09-08 DIAGNOSIS — R609 Edema, unspecified: Secondary | ICD-10-CM | POA: Diagnosis not present

## 2017-09-08 DIAGNOSIS — I729 Aneurysm of unspecified site: Secondary | ICD-10-CM | POA: Diagnosis not present

## 2017-09-08 DIAGNOSIS — D72829 Elevated white blood cell count, unspecified: Secondary | ICD-10-CM | POA: Diagnosis present

## 2017-09-08 DIAGNOSIS — Z8673 Personal history of transient ischemic attack (TIA), and cerebral infarction without residual deficits: Secondary | ICD-10-CM | POA: Diagnosis not present

## 2017-09-08 DIAGNOSIS — N2581 Secondary hyperparathyroidism of renal origin: Secondary | ICD-10-CM | POA: Diagnosis not present

## 2017-09-08 DIAGNOSIS — Z992 Dependence on renal dialysis: Secondary | ICD-10-CM | POA: Diagnosis not present

## 2017-09-08 DIAGNOSIS — E7800 Pure hypercholesterolemia, unspecified: Secondary | ICD-10-CM | POA: Diagnosis present

## 2017-09-08 DIAGNOSIS — N184 Chronic kidney disease, stage 4 (severe): Secondary | ICD-10-CM | POA: Diagnosis not present

## 2017-09-08 DIAGNOSIS — E1122 Type 2 diabetes mellitus with diabetic chronic kidney disease: Secondary | ICD-10-CM | POA: Diagnosis present

## 2017-09-08 DIAGNOSIS — Y832 Surgical operation with anastomosis, bypass or graft as the cause of abnormal reaction of the patient, or of later complication, without mention of misadventure at the time of the procedure: Secondary | ICD-10-CM | POA: Diagnosis present

## 2017-09-08 DIAGNOSIS — R011 Cardiac murmur, unspecified: Secondary | ICD-10-CM | POA: Diagnosis present

## 2017-09-08 DIAGNOSIS — T82330A Leakage of aortic (bifurcation) graft (replacement), initial encounter: Secondary | ICD-10-CM | POA: Diagnosis present

## 2017-09-08 DIAGNOSIS — D259 Leiomyoma of uterus, unspecified: Secondary | ICD-10-CM | POA: Diagnosis not present

## 2017-09-08 DIAGNOSIS — J449 Chronic obstructive pulmonary disease, unspecified: Secondary | ICD-10-CM | POA: Diagnosis not present

## 2017-09-08 DIAGNOSIS — M7989 Other specified soft tissue disorders: Secondary | ICD-10-CM | POA: Diagnosis not present

## 2017-09-08 DIAGNOSIS — J90 Pleural effusion, not elsewhere classified: Secondary | ICD-10-CM | POA: Diagnosis not present

## 2017-09-08 DIAGNOSIS — I10 Essential (primary) hypertension: Secondary | ICD-10-CM | POA: Diagnosis not present

## 2017-09-08 DIAGNOSIS — M810 Age-related osteoporosis without current pathological fracture: Secondary | ICD-10-CM | POA: Diagnosis present

## 2017-09-08 DIAGNOSIS — R52 Pain, unspecified: Secondary | ICD-10-CM | POA: Diagnosis not present

## 2017-09-08 DIAGNOSIS — M069 Rheumatoid arthritis, unspecified: Secondary | ICD-10-CM | POA: Diagnosis present

## 2017-09-08 DIAGNOSIS — D62 Acute posthemorrhagic anemia: Secondary | ICD-10-CM | POA: Diagnosis present

## 2017-09-08 DIAGNOSIS — M25562 Pain in left knee: Secondary | ICD-10-CM | POA: Diagnosis not present

## 2017-09-08 DIAGNOSIS — E875 Hyperkalemia: Secondary | ICD-10-CM | POA: Diagnosis present

## 2017-09-08 DIAGNOSIS — J45909 Unspecified asthma, uncomplicated: Secondary | ICD-10-CM | POA: Diagnosis not present

## 2017-09-08 DIAGNOSIS — M06062 Rheumatoid arthritis without rheumatoid factor, left knee: Secondary | ICD-10-CM | POA: Diagnosis not present

## 2017-09-08 DIAGNOSIS — N189 Chronic kidney disease, unspecified: Secondary | ICD-10-CM | POA: Diagnosis not present

## 2017-09-08 DIAGNOSIS — Z886 Allergy status to analgesic agent status: Secondary | ICD-10-CM | POA: Diagnosis not present

## 2017-09-08 DIAGNOSIS — Z95828 Presence of other vascular implants and grafts: Secondary | ICD-10-CM | POA: Diagnosis not present

## 2017-09-08 DIAGNOSIS — I712 Thoracic aortic aneurysm, without rupture: Secondary | ICD-10-CM | POA: Diagnosis not present

## 2017-09-08 DIAGNOSIS — Z9119 Patient's noncompliance with other medical treatment and regimen: Secondary | ICD-10-CM | POA: Diagnosis not present

## 2017-09-08 DIAGNOSIS — S27892A Contusion of other specified intrathoracic organs, initial encounter: Secondary | ICD-10-CM | POA: Diagnosis present

## 2017-09-08 DIAGNOSIS — Z6821 Body mass index (BMI) 21.0-21.9, adult: Secondary | ICD-10-CM | POA: Diagnosis not present

## 2017-09-08 DIAGNOSIS — N185 Chronic kidney disease, stage 5: Secondary | ICD-10-CM | POA: Diagnosis present

## 2017-09-08 DIAGNOSIS — R634 Abnormal weight loss: Secondary | ICD-10-CM | POA: Diagnosis present

## 2017-09-08 DIAGNOSIS — D631 Anemia in chronic kidney disease: Secondary | ICD-10-CM | POA: Diagnosis present

## 2017-09-08 DIAGNOSIS — Z885 Allergy status to narcotic agent status: Secondary | ICD-10-CM | POA: Diagnosis not present

## 2017-09-08 HISTORY — PX: THORACIC AORTIC ENDOVASCULAR STENT GRAFT: SHX6112

## 2017-09-08 LAB — URINALYSIS, ROUTINE W REFLEX MICROSCOPIC
BILIRUBIN URINE: NEGATIVE
Bacteria, UA: NONE SEEN
GLUCOSE, UA: NEGATIVE mg/dL
Hgb urine dipstick: NEGATIVE
KETONES UR: NEGATIVE mg/dL
LEUKOCYTES UA: NEGATIVE
NITRITE: NEGATIVE
PH: 6 (ref 5.0–8.0)
Protein, ur: 100 mg/dL — AB
Specific Gravity, Urine: 1.014 (ref 1.005–1.030)

## 2017-09-08 LAB — ECHO INTRAOPERATIVE TEE
Height: 70 in
Weight: 2128 oz

## 2017-09-08 LAB — POCT I-STAT, CHEM 8
BUN: 41 mg/dL — ABNORMAL HIGH (ref 8–23)
Calcium, Ion: 1.19 mmol/L (ref 1.15–1.40)
Chloride: 113 mmol/L — ABNORMAL HIGH (ref 98–111)
Creatinine, Ser: 2.9 mg/dL — ABNORMAL HIGH (ref 0.44–1.00)
Glucose, Bld: 82 mg/dL (ref 70–99)
HCT: 25 % — ABNORMAL LOW (ref 36.0–46.0)
HEMOGLOBIN: 8.5 g/dL — AB (ref 12.0–15.0)
Potassium: 4.4 mmol/L (ref 3.5–5.1)
SODIUM: 142 mmol/L (ref 135–145)
TCO2: 22 mmol/L (ref 22–32)

## 2017-09-08 LAB — GLUCOSE, CAPILLARY
GLUCOSE-CAPILLARY: 75 mg/dL (ref 70–99)
Glucose-Capillary: 74 mg/dL (ref 70–99)
Glucose-Capillary: 80 mg/dL (ref 70–99)

## 2017-09-08 LAB — CBC
HEMATOCRIT: 25.6 % — AB (ref 36.0–46.0)
HEMOGLOBIN: 8 g/dL — AB (ref 12.0–15.0)
MCH: 27.2 pg (ref 26.0–34.0)
MCHC: 31.3 g/dL (ref 30.0–36.0)
MCV: 87.1 fL (ref 78.0–100.0)
PLATELETS: 385 10*3/uL (ref 150–400)
RBC: 2.94 MIL/uL — AB (ref 3.87–5.11)
RDW: 18.2 % — ABNORMAL HIGH (ref 11.5–15.5)
WBC: 9.9 10*3/uL (ref 4.0–10.5)

## 2017-09-08 LAB — PREPARE RBC (CROSSMATCH)

## 2017-09-08 LAB — POCT I-STAT 3, ART BLOOD GAS (G3+)
ACID-BASE DEFICIT: 7 mmol/L — AB (ref 0.0–2.0)
ACID-BASE DEFICIT: 8 mmol/L — AB (ref 0.0–2.0)
Bicarbonate: 19.5 mmol/L — ABNORMAL LOW (ref 20.0–28.0)
Bicarbonate: 18.3 mmol/L — ABNORMAL LOW (ref 20.0–28.0)
O2 Saturation: 100 %
O2 Saturation: 95 %
PCO2 ART: 42 mmHg (ref 32.0–48.0)
PH ART: 7.274 — AB (ref 7.350–7.450)
PO2 ART: 81 mmHg — AB (ref 83.0–108.0)
Patient temperature: 96.7
TCO2: 21 mmol/L — ABNORMAL LOW (ref 22–32)
TCO2: 20 mmol/L — ABNORMAL LOW (ref 22–32)
pCO2 arterial: 39.6 mmHg (ref 32.0–48.0)
pH, Arterial: 7.269 — ABNORMAL LOW (ref 7.350–7.450)
pO2, Arterial: 417 mmHg — ABNORMAL HIGH (ref 83.0–108.0)

## 2017-09-08 LAB — BASIC METABOLIC PANEL
ANION GAP: 14 (ref 5–15)
BUN: 50 mg/dL — ABNORMAL HIGH (ref 8–23)
CO2: 16 mmol/L — ABNORMAL LOW (ref 22–32)
Calcium: 8.1 mg/dL — ABNORMAL LOW (ref 8.9–10.3)
Chloride: 111 mmol/L (ref 98–111)
Creatinine, Ser: 3.02 mg/dL — ABNORMAL HIGH (ref 0.44–1.00)
GFR, EST AFRICAN AMERICAN: 18 mL/min — AB (ref >60)
GFR, EST NON AFRICAN AMERICAN: 15 mL/min — AB (ref >60)
Glucose, Bld: 68 mg/dL — ABNORMAL LOW (ref 70–99)
POTASSIUM: 4.8 mmol/L (ref 3.5–5.1)
Sodium: 141 mmol/L (ref 135–145)

## 2017-09-08 LAB — HIV ANTIBODY (ROUTINE TESTING W REFLEX): HIV Screen 4th Generation wRfx: NONREACTIVE

## 2017-09-08 LAB — HEMOGLOBIN AND HEMATOCRIT, BLOOD
HEMATOCRIT: 25 % — AB (ref 36.0–46.0)
Hemoglobin: 8.1 g/dL — ABNORMAL LOW (ref 12.0–15.0)

## 2017-09-08 LAB — PHOSPHORUS: Phosphorus: 5.1 mg/dL — ABNORMAL HIGH (ref 2.5–4.6)

## 2017-09-08 LAB — I-STAT TROPONIN, ED: TROPONIN I, POC: 0 ng/mL (ref 0.00–0.08)

## 2017-09-08 LAB — TROPONIN I: Troponin I: <0.03 ng/mL (ref <0.03)

## 2017-09-08 LAB — IRON AND TIBC
Iron: 9 ug/dL — ABNORMAL LOW (ref 28–170)
Saturation Ratios: 4 % — ABNORMAL LOW (ref 10.4–31.8)
TIBC: 224 ug/dL — ABNORMAL LOW (ref 250–450)
UIBC: 215 ug/dL

## 2017-09-08 LAB — FERRITIN: FERRITIN: 343 ng/mL — AB (ref 11–307)

## 2017-09-08 LAB — MAGNESIUM: Magnesium: 2.1 mg/dL (ref 1.7–2.4)

## 2017-09-08 LAB — VITAMIN B12: Vitamin B-12: 442 pg/mL (ref 180–914)

## 2017-09-08 LAB — SURGICAL PCR SCREEN
MRSA, PCR: NEGATIVE
Staphylococcus aureus: NEGATIVE

## 2017-09-08 LAB — FOLATE: Folate: 8.9 ng/mL (ref >5.9)

## 2017-09-08 SURGERY — INSERTION, ENDOVASCULAR STENT GRAFT, AORTA, THORACIC
Anesthesia: General

## 2017-09-08 MED ORDER — IODIXANOL 320 MG/ML IV SOLN
INTRAVENOUS | Status: DC | PRN
  Administered 2017-09-08: 64.2 mL via INTRAVENOUS

## 2017-09-08 MED ORDER — MILRINONE LACTATE IN DEXTROSE 20-5 MG/100ML-% IV SOLN
0.1250 ug/kg/min | INTRAVENOUS | Status: DC
  Filled 2017-09-08: qty 100

## 2017-09-08 MED ORDER — SODIUM CHLORIDE 0.9 % IV SOLN
10.0000 mL/h | INTRAVENOUS | Status: DC
  Administered 2017-09-08: 10 mL/h via INTRAVENOUS

## 2017-09-08 MED ORDER — SODIUM CHLORIDE 0.9 % IV SOLN
10.0000 mL/h | Freq: Once | INTRAVENOUS | Status: AC
  Administered 2017-09-08: 10 mL/h via INTRAVENOUS

## 2017-09-08 MED ORDER — TRANEXAMIC ACID (OHS) PUMP PRIME SOLUTION
2.0000 mg/kg | INTRAVENOUS | Status: DC
  Filled 2017-09-08: qty 1.21

## 2017-09-08 MED ORDER — PROTAMINE SULFATE 10 MG/ML IV SOLN
INTRAVENOUS | Status: AC
  Filled 2017-09-08: qty 5

## 2017-09-08 MED ORDER — ONDANSETRON HCL 4 MG/2ML IJ SOLN
4.0000 mg | Freq: Four times a day (QID) | INTRAMUSCULAR | Status: DC | PRN
  Administered 2017-09-09: 4 mg via INTRAVENOUS
  Filled 2017-09-08 (×2): qty 2

## 2017-09-08 MED ORDER — TRANEXAMIC ACID 1000 MG/10ML IV SOLN
1.5000 mg/kg/h | INTRAVENOUS | Status: DC
  Filled 2017-09-08: qty 25

## 2017-09-08 MED ORDER — HEPARIN SODIUM (PORCINE) 1000 UNIT/ML IJ SOLN
INTRAMUSCULAR | Status: DC | PRN
  Administered 2017-09-08 (×2): 3000 [IU] via INTRAVENOUS

## 2017-09-08 MED ORDER — SUGAMMADEX SODIUM 200 MG/2ML IV SOLN
INTRAVENOUS | Status: DC | PRN
  Administered 2017-09-08: 200 mg via INTRAVENOUS

## 2017-09-08 MED ORDER — ONDANSETRON HCL 4 MG/2ML IJ SOLN
INTRAMUSCULAR | Status: DC | PRN
  Administered 2017-09-08: 4 mg via INTRAVENOUS

## 2017-09-08 MED ORDER — FENTANYL CITRATE (PF) 100 MCG/2ML IJ SOLN
INTRAMUSCULAR | Status: DC | PRN
  Administered 2017-09-08 (×2): 50 ug via INTRAVENOUS
  Administered 2017-09-08: 100 ug via INTRAVENOUS
  Administered 2017-09-08: 50 ug via INTRAVENOUS

## 2017-09-08 MED ORDER — HYDROMORPHONE HCL 1 MG/ML IJ SOLN
0.5000 mg | Freq: Once | INTRAMUSCULAR | Status: AC
  Administered 2017-09-08: 0.5 mg via INTRAVENOUS
  Filled 2017-09-08: qty 0.5

## 2017-09-08 MED ORDER — HYDRALAZINE HCL 20 MG/ML IJ SOLN
5.0000 mg | INTRAMUSCULAR | Status: AC | PRN
  Administered 2017-09-14 – 2017-09-16 (×2): 5 mg via INTRAVENOUS
  Filled 2017-09-08 (×2): qty 1

## 2017-09-08 MED ORDER — ROCURONIUM BROMIDE 100 MG/10ML IV SOLN
INTRAVENOUS | Status: DC | PRN
  Administered 2017-09-08: 20 mg via INTRAVENOUS
  Administered 2017-09-08: 50 mg via INTRAVENOUS

## 2017-09-08 MED ORDER — SODIUM CHLORIDE 0.9 % IV SOLN
750.0000 mg | INTRAVENOUS | Status: DC
  Filled 2017-09-08: qty 750

## 2017-09-08 MED ORDER — MIDAZOLAM HCL 2 MG/2ML IJ SOLN
INTRAMUSCULAR | Status: AC
  Filled 2017-09-08: qty 2

## 2017-09-08 MED ORDER — CHLORHEXIDINE GLUCONATE CLOTH 2 % EX PADS
6.0000 | MEDICATED_PAD | Freq: Once | CUTANEOUS | Status: AC
  Administered 2017-09-08: 6 via TOPICAL

## 2017-09-08 MED ORDER — DEXTROSE 5 % IV SOLN
0.0000 ug/min | INTRAVENOUS | Status: DC
  Filled 2017-09-08: qty 4

## 2017-09-08 MED ORDER — SODIUM CHLORIDE 0.9 % IV SOLN
INTRAVENOUS | Status: DC | PRN
  Administered 2017-09-08: 20 ug/min via INTRAVENOUS

## 2017-09-08 MED ORDER — SODIUM CHLORIDE 0.9 % IV SOLN: 500.0000 mL | Freq: Once | INTRAVENOUS | Status: DC | PRN

## 2017-09-08 MED ORDER — IOPAMIDOL (ISOVUE-370) INJECTION 76%
100.0000 mL | Freq: Once | INTRAVENOUS | Status: AC | PRN
Start: 2017-09-08 — End: 2017-09-08
  Administered 2017-09-08: 80 mL via INTRAVENOUS

## 2017-09-08 MED ORDER — LACTATED RINGERS IV BOLUS
500.0000 mL | Freq: Once | INTRAVENOUS | Status: AC | PRN
  Administered 2017-09-09: 500 mL via INTRAVENOUS

## 2017-09-08 MED ORDER — PROTAMINE SULFATE 10 MG/ML IV SOLN
INTRAVENOUS | Status: DC | PRN
  Administered 2017-09-08: 20 mg via INTRAVENOUS
  Administered 2017-09-08: 10 mg via INTRAVENOUS
  Administered 2017-09-08: 20 mg via INTRAVENOUS

## 2017-09-08 MED ORDER — CHLORHEXIDINE GLUCONATE 0.12 % MT SOLN: 15.0000 mL | Freq: Once | OROMUCOSAL | Status: DC

## 2017-09-08 MED ORDER — INSULIN ASPART 100 UNIT/ML ~~LOC~~ SOLN
1.0000 [IU] | Freq: Four times a day (QID) | SUBCUTANEOUS | Status: DC
  Administered 2017-09-11: 1 [IU] via SUBCUTANEOUS
  Administered 2017-09-11: 2 [IU] via SUBCUTANEOUS

## 2017-09-08 MED ORDER — HYDROMORPHONE HCL 1 MG/ML IJ SOLN
0.5000 mg | Freq: Once | INTRAMUSCULAR | Status: AC
  Administered 2017-09-08: 0.5 mg via INTRAVENOUS
  Filled 2017-09-08: qty 1

## 2017-09-08 MED ORDER — POTASSIUM CHLORIDE CRYS ER 20 MEQ PO TBCR: 20.0000 meq | EXTENDED_RELEASE_TABLET | Freq: Every day | ORAL | Status: DC | PRN

## 2017-09-08 MED ORDER — NITROGLYCERIN IN D5W 200-5 MCG/ML-% IV SOLN
2.0000 ug/min | INTRAVENOUS | Status: DC
  Filled 2017-09-08: qty 250

## 2017-09-08 MED ORDER — SODIUM CHLORIDE 0.9 % IV SOLN
INTRAVENOUS | Status: DC
  Filled 2017-09-08: qty 1

## 2017-09-08 MED ORDER — MAGNESIUM SULFATE 50 % IJ SOLN
40.0000 meq | INTRAMUSCULAR | Status: DC
  Filled 2017-09-08: qty 9.85

## 2017-09-08 MED ORDER — PROPOFOL 10 MG/ML IV BOLUS
INTRAVENOUS | Status: AC
  Filled 2017-09-08: qty 20

## 2017-09-08 MED ORDER — ACETAMINOPHEN 10 MG/ML IV SOLN
1000.0000 mg | Freq: Four times a day (QID) | INTRAVENOUS | Status: DC | PRN
  Administered 2017-09-08: 1000 mg via INTRAVENOUS
  Filled 2017-09-08 (×2): qty 100

## 2017-09-08 MED ORDER — SODIUM CHLORIDE 0.9 % IV SOLN
30.0000 ug/min | INTRAVENOUS | Status: DC
  Filled 2017-09-08: qty 2

## 2017-09-08 MED ORDER — SODIUM CHLORIDE 0.9 % IV SOLN
1250.0000 mg | INTRAVENOUS | Status: DC
  Filled 2017-09-08: qty 1250

## 2017-09-08 MED ORDER — LACTATED RINGERS IV SOLN
INTRAVENOUS | Status: DC | PRN
  Administered 2017-09-08 (×3): via INTRAVENOUS

## 2017-09-08 MED ORDER — PLASMA-LYTE 148 IV SOLN
INTRAVENOUS | Status: DC
  Filled 2017-09-08: qty 2.5

## 2017-09-08 MED ORDER — CHLORHEXIDINE GLUCONATE CLOTH 2 % EX PADS: 6.0000 | MEDICATED_PAD | Freq: Once | CUTANEOUS | Status: DC

## 2017-09-08 MED ORDER — FENTANYL CITRATE (PF) 250 MCG/5ML IJ SOLN
INTRAMUSCULAR | Status: AC
  Filled 2017-09-08: qty 5

## 2017-09-08 MED ORDER — LABETALOL HCL 5 MG/ML IV SOLN: 10.0000 mg | INTRAVENOUS | Status: DC | PRN

## 2017-09-08 MED ORDER — MIDAZOLAM HCL 5 MG/5ML IJ SOLN
INTRAMUSCULAR | Status: DC | PRN
  Administered 2017-09-08: 2 mg via INTRAVENOUS

## 2017-09-08 MED ORDER — CEFAZOLIN SODIUM-DEXTROSE 2-4 GM/100ML-% IV SOLN: 2.0000 g | Freq: Three times a day (TID) | INTRAVENOUS | Status: DC

## 2017-09-08 MED ORDER — SODIUM CHLORIDE 0.9 % IV SOLN
INTRAVENOUS | Status: DC
  Filled 2017-09-08: qty 30

## 2017-09-08 MED ORDER — IOPAMIDOL (ISOVUE-370) INJECTION 76%
INTRAVENOUS | Status: AC
  Filled 2017-09-08: qty 100

## 2017-09-08 MED ORDER — PROPOFOL 10 MG/ML IV BOLUS
INTRAVENOUS | Status: DC | PRN
  Administered 2017-09-08: 110 mg via INTRAVENOUS

## 2017-09-08 MED ORDER — SODIUM CHLORIDE 0.9 % IV SOLN: 250.0000 mL | INTRAVENOUS | Status: DC | PRN

## 2017-09-08 MED ORDER — METOPROLOL TARTRATE 12.5 MG HALF TABLET: 12.5000 mg | ORAL_TABLET | Freq: Once | ORAL | Status: DC

## 2017-09-08 MED ORDER — DOPAMINE-DEXTROSE 3.2-5 MG/ML-% IV SOLN
0.0000 ug/kg/min | INTRAVENOUS | Status: DC
  Filled 2017-09-08: qty 250

## 2017-09-08 MED ORDER — FAMOTIDINE IN NACL 20-0.9 MG/50ML-% IV SOLN
20.0000 mg | INTRAVENOUS | Status: DC
  Administered 2017-09-08: 20 mg via INTRAVENOUS
  Filled 2017-09-08: qty 50

## 2017-09-08 MED ORDER — 0.9 % SODIUM CHLORIDE (POUR BTL) OPTIME
TOPICAL | Status: DC | PRN
  Administered 2017-09-08: 1000 mL

## 2017-09-08 MED ORDER — SODIUM CHLORIDE 0.9 % IV SOLN
1.5000 g | INTRAVENOUS | Status: AC
  Administered 2017-09-08: 1.5 g via INTRAVENOUS
  Filled 2017-09-08: qty 1.5

## 2017-09-08 MED ORDER — CEFAZOLIN SODIUM-DEXTROSE 1-4 GM/50ML-% IV SOLN
1.0000 g | Freq: Two times a day (BID) | INTRAVENOUS | Status: DC
  Administered 2017-09-09: 1 g via INTRAVENOUS
  Filled 2017-09-08 (×2): qty 50

## 2017-09-08 MED ORDER — HEPARIN SODIUM (PORCINE) 5000 UNIT/ML IJ SOLN
INTRAMUSCULAR | Status: AC
  Filled 2017-09-08 (×2): qty 1.2

## 2017-09-08 MED ORDER — DOCUSATE SODIUM 100 MG PO CAPS
100.0000 mg | ORAL_CAPSULE | Freq: Every day | ORAL | Status: DC
  Administered 2017-09-09 – 2017-09-19 (×11): 100 mg via ORAL
  Filled 2017-09-08 (×13): qty 1

## 2017-09-08 MED ORDER — HYDROMORPHONE HCL 1 MG/ML IJ SOLN
0.5000 mg | INTRAMUSCULAR | Status: DC | PRN
  Administered 2017-09-08 – 2017-09-10 (×7): 0.5 mg via INTRAVENOUS
  Filled 2017-09-08 (×7): qty 0.5

## 2017-09-08 MED ORDER — PHENYLEPHRINE HCL 10 MG/ML IJ SOLN
INTRAMUSCULAR | Status: DC | PRN
  Administered 2017-09-08: 80 ug via INTRAVENOUS

## 2017-09-08 MED ORDER — SODIUM CHLORIDE 0.9 % IV SOLN
INTRAVENOUS | Status: DC | PRN
  Administered 2017-09-08: 500 mL

## 2017-09-08 MED ORDER — ESMOLOL HCL 100 MG/10ML IV SOLN
INTRAVENOUS | Status: DC | PRN
  Administered 2017-09-08: 20 mg via INTRAVENOUS

## 2017-09-08 MED ORDER — LABETALOL HCL 5 MG/ML IV SOLN: 5.0000 mg | INTRAVENOUS | Status: DC | PRN

## 2017-09-08 MED ORDER — MAGNESIUM SULFATE 2 GM/50ML IV SOLN: 2.0000 g | Freq: Every day | INTRAVENOUS | Status: DC | PRN

## 2017-09-08 MED ORDER — HEPARIN SODIUM (PORCINE) 5000 UNIT/ML IJ SOLN
5000.0000 [IU] | Freq: Three times a day (TID) | INTRAMUSCULAR | Status: DC
  Administered 2017-09-08 – 2017-09-23 (×43): 5000 [IU] via SUBCUTANEOUS
  Filled 2017-09-08 (×43): qty 1

## 2017-09-08 MED ORDER — SODIUM BICARBONATE 8.4 % IV SOLN
50.0000 meq | Freq: Once | INTRAVENOUS | Status: AC
  Administered 2017-09-08: 50 meq via INTRAVENOUS
  Filled 2017-09-08: qty 50

## 2017-09-08 MED ORDER — POTASSIUM CHLORIDE 2 MEQ/ML IV SOLN
80.0000 meq | INTRAVENOUS | Status: DC
  Filled 2017-09-08: qty 40

## 2017-09-08 MED ORDER — FAMOTIDINE IN NACL 20-0.9 MG/50ML-% IV SOLN: 20.0000 mg | Freq: Two times a day (BID) | INTRAVENOUS | Status: DC

## 2017-09-08 MED ORDER — DEXMEDETOMIDINE HCL IN NACL 400 MCG/100ML IV SOLN
0.1000 ug/kg/h | INTRAVENOUS | Status: DC
  Filled 2017-09-08: qty 100

## 2017-09-08 MED ORDER — TRANEXAMIC ACID (OHS) BOLUS VIA INFUSION
15.0000 mg/kg | INTRAVENOUS | Status: DC
  Filled 2017-09-08: qty 905

## 2017-09-08 MED ORDER — PANTOPRAZOLE SODIUM 40 MG PO TBEC
40.0000 mg | DELAYED_RELEASE_TABLET | Freq: Every day | ORAL | Status: DC
  Administered 2017-09-08 – 2017-09-20 (×13): 40 mg via ORAL
  Filled 2017-09-08 (×13): qty 1

## 2017-09-08 SURGICAL SUPPLY — 170 items
ADAPTER CARDIO PERF ANTE/RETRO (ADAPTER) ×1 IMPLANT
ADH SKN CLS APL DERMABOND .7 (GAUZE/BANDAGES/DRESSINGS) ×1
ADH SKN CLS LQ APL DERMABOND (GAUZE/BANDAGES/DRESSINGS) ×1
ADH SRG 12 PREFL SYR 3 SPRDR (MISCELLANEOUS)
ADPR PRFSN 84XANTGRD RTRGD (ADAPTER)
BAG DECANTER FOR FLEXI CONT (MISCELLANEOUS) ×1 IMPLANT
BAG SNAP BAND KOVER 36X36 (MISCELLANEOUS) ×2 IMPLANT
BLADE CLIPPER SURG (BLADE) IMPLANT
BLADE CORE FAN STRYKER (BLADE) ×2 IMPLANT
BLADE STERNUM SYSTEM 6 (BLADE) ×1 IMPLANT
BLADE SURG 11 STRL SS (BLADE) ×2 IMPLANT
BLADE SURG 15 STRL LF DISP TIS (BLADE) ×1 IMPLANT
BLADE SURG 15 STRL SS (BLADE)
BRUSH SCRUB EZ PLAIN DRY (MISCELLANEOUS) ×2 IMPLANT
CANISTER SUCT 3000ML PPV (MISCELLANEOUS) ×4 IMPLANT
CANN PRFSN .5XCNCT 15X34-48 (MISCELLANEOUS)
CANNULA ARTERIAL 007325 (MISCELLANEOUS) IMPLANT
CANNULA ARTERIAL 14F 007324 (MISCELLANEOUS) IMPLANT
CANNULA PRFSN .5XCNCT 15X34-48 (MISCELLANEOUS) IMPLANT
CANNULA VEN 2 STAGE (MISCELLANEOUS)
CATH ACCU-VU SIZ PIG 5F 100CM (CATHETERS) ×2 IMPLANT
CATH ANGIO 5F BER2 65CM (CATHETERS) ×2 IMPLANT
CATH BALLN TRILOBE 26-42 (BALLOONS) ×2 IMPLANT
CATH CPB KIT GERHARDT (MISCELLANEOUS) ×1 IMPLANT
CATH DIAG EXPO 6F VENT PIG 145 (CATHETERS) ×2 IMPLANT
CATH RETROPLEGIA CORONARY 14FR (CATHETERS) ×1 IMPLANT
CATH THORACIC 28FR (CATHETERS) IMPLANT
CATH VISIONS PV .035 IVUS (CATHETERS) ×2 IMPLANT
CLIP VESOCCLUDE MED 6/CT (CLIP) ×1 IMPLANT
CLIP VESOCCLUDE SM WIDE 24/CT (CLIP) IMPLANT
CLIP VESOCCLUDE SM WIDE 6/CT (CLIP) IMPLANT
CLOSURE WOUND 1/2 X4 (GAUZE/BANDAGES/DRESSINGS)
CONN 1/2X1/2X1/2  BEN (MISCELLANEOUS)
CONN 1/2X1/2X1/2 BEN (MISCELLANEOUS) IMPLANT
CONN 3/8X3/8 GISH STERILE (MISCELLANEOUS) IMPLANT
CONN Y 3/8X3/8X3/8  BEN (MISCELLANEOUS)
CONN Y 3/8X3/8X3/8 BEN (MISCELLANEOUS) IMPLANT
COVER DOME SNAP 22 D (MISCELLANEOUS) ×4 IMPLANT
COVER PROBE W GEL 5X96 (DRAPES) ×5 IMPLANT
COVER SURGICAL LIGHT HANDLE (MISCELLANEOUS) ×2 IMPLANT
CRADLE DONUT ADULT HEAD (MISCELLANEOUS) IMPLANT
DERMABOND ADHESIVE PROPEN (GAUZE/BANDAGES/DRESSINGS) ×2
DERMABOND ADVANCED (GAUZE/BANDAGES/DRESSINGS) ×2
DERMABOND ADVANCED .7 DNX12 (GAUZE/BANDAGES/DRESSINGS) ×1 IMPLANT
DERMABOND ADVANCED .7 DNX6 (GAUZE/BANDAGES/DRESSINGS) IMPLANT
DEVICE CLOSURE PERCLS PRGLD 6F (VASCULAR PRODUCTS) IMPLANT
DRAIN CHANNEL 15F RND FF W/TCR (WOUND CARE) IMPLANT
DRAIN CHANNEL 19F RND (DRAIN) IMPLANT
DRAIN CHANNEL 28F RND 3/8 FF (WOUND CARE) ×1 IMPLANT
DRAIN SNY 10X20 3/4 PERF (WOUND CARE) IMPLANT
DRAIN WOUND SNY 15 RND (WOUND CARE) IMPLANT
DRAPE BRACHIAL (DRAPES) ×2 IMPLANT
DRAPE INCISE IOBAN 66X45 STRL (DRAPES) ×4 IMPLANT
DRAPE ZERO GRAVITY STERILE (DRAPES) ×3 IMPLANT
DRSG AQUACEL AG ADV 3.5X14 (GAUZE/BANDAGES/DRESSINGS) ×1 IMPLANT
DRSG TEGADERM 2-3/8X2-3/4 SM (GAUZE/BANDAGES/DRESSINGS) ×3 IMPLANT
DRYSEAL FLEXSHEATH 16FR 33CM (SHEATH) ×2
DRYSEAL FLEXSHEATH 22FR 33CM (SHEATH) ×4
ELECT BLADE 4.0 EZ CLEAN MEGAD (MISCELLANEOUS)
ELECT CAUTERY BLADE 6.4 (BLADE) ×4 IMPLANT
ELECT REM PT RETURN 9FT ADLT (ELECTROSURGICAL) ×6
ELECTRODE BLDE 4.0 EZ CLN MEGD (MISCELLANEOUS) ×1 IMPLANT
ELECTRODE REM PT RTRN 9FT ADLT (ELECTROSURGICAL) ×4 IMPLANT
ENDOPROSTHESIS THORAC 34X34X10 (Endovascular Graft) IMPLANT
ENDOPROTH THORACIC 34X34X10 (Endovascular Graft) ×3 IMPLANT
EVACUATOR SILICONE 100CC (DRAIN) IMPLANT
FELT TEFLON 6X6 (MISCELLANEOUS) IMPLANT
GAUZE SPONGE 4X4 12PLY STRL (GAUZE/BANDAGES/DRESSINGS) ×2 IMPLANT
GAUZE SPONGE 4X4 16PLY XRAY LF (GAUZE/BANDAGES/DRESSINGS) ×2 IMPLANT
GLOVE BIO SURGEON STRL SZ 6.5 (GLOVE) ×7 IMPLANT
GLOVE BIO SURGEON STRL SZ7.5 (GLOVE) ×3 IMPLANT
GLOVE BIO SURGEONS STRL SZ 6.5 (GLOVE) ×4
GLOVE BIOGEL PI IND STRL 6.5 (GLOVE) IMPLANT
GLOVE BIOGEL PI IND STRL 7.5 (GLOVE) IMPLANT
GLOVE BIOGEL PI INDICATOR 6.5 (GLOVE) ×10
GLOVE BIOGEL PI INDICATOR 7.5 (GLOVE) ×4
GOWN STRL REUS W/ TWL LRG LVL3 (GOWN DISPOSABLE) ×6 IMPLANT
GOWN STRL REUS W/ TWL XL LVL3 (GOWN DISPOSABLE) ×1 IMPLANT
GOWN STRL REUS W/TWL LRG LVL3 (GOWN DISPOSABLE) ×12
GOWN STRL REUS W/TWL XL LVL3 (GOWN DISPOSABLE) ×3
GRAFT BALLN CATH 65CM (STENTS) IMPLANT
HEMOSTAT POWDER SURGIFOAM 1G (HEMOSTASIS) IMPLANT
HEMOSTAT SNOW SURGICEL 2X4 (HEMOSTASIS) IMPLANT
HEMOSTAT SURGICEL 2X14 (HEMOSTASIS) IMPLANT
INSERT FOGARTY SM (MISCELLANEOUS) ×1 IMPLANT
KIT BASIN OR (CUSTOM PROCEDURE TRAY) ×4 IMPLANT
KIT SUCTION CATH 14FR (SUCTIONS) IMPLANT
KIT TURNOVER KIT B (KITS) ×4 IMPLANT
LEAD PACING MYOCARDI (MISCELLANEOUS) ×1 IMPLANT
NDL PERC 18GX7CM (NEEDLE) ×1 IMPLANT
NEEDLE AORTIC AIR ASPIRATING (NEEDLE) IMPLANT
NEEDLE PERC 18GX7CM (NEEDLE) ×3 IMPLANT
NS IRRIG 1000ML POUR BTL (IV SOLUTION) ×7 IMPLANT
PACK E OPEN HEART (SUTURE) IMPLANT
PACK ENDOVASCULAR (PACKS) ×3 IMPLANT
PACK OPEN HEART (CUSTOM PROCEDURE TRAY) ×1 IMPLANT
PAD ARMBOARD 7.5X6 YLW CONV (MISCELLANEOUS) ×8 IMPLANT
PENCIL BUTTON HOLSTER BLD 10FT (ELECTRODE) ×5 IMPLANT
PERCLOSE PROGLIDE 6F (VASCULAR PRODUCTS) ×6
PROTECTION STATION PRESSURIZED (MISCELLANEOUS) ×3
SET DILATOR ENDOVASCULAR 24FR (SET/KITS/TRAYS/PACK) ×2 IMPLANT
SET MICROPUNCTURE 5F STIFF (MISCELLANEOUS) ×2 IMPLANT
SHEATH AVANTI 11CM 8FR (SHEATH) IMPLANT
SHEATH DRYSEAL FLEX 16FR 33CM (SHEATH) IMPLANT
SHEATH DRYSEAL FLEX 22FR 33CM (SHEATH) IMPLANT
SHEATH PINNACLE 8F 10CM (SHEATH) ×2 IMPLANT
SHIELD RADPAD SCOOP 12X17 (MISCELLANEOUS) ×6 IMPLANT
SLEEVE ISOL F/PACE RF HD COVER (MISCELLANEOUS) ×2 IMPLANT
SPONGE LAP 18X18 X RAY DECT (DISPOSABLE) IMPLANT
SPONGE LAP 4X18 RFD (DISPOSABLE) IMPLANT
STAPLER VISISTAT 35W (STAPLE) IMPLANT
STATION PROTECTION PRESSURIZED (MISCELLANEOUS) IMPLANT
STENT GRAFT BALLN CATH 65CM (STENTS)
STENT THORAC ACS 34X34X20 (Endovascular Graft) ×2 IMPLANT
STOPCOCK 4 WAY LG BORE MALE ST (IV SETS) IMPLANT
STOPCOCK MORSE 400PSI 3WAY (MISCELLANEOUS) ×3 IMPLANT
STRIP CLOSURE SKIN 1/2X4 (GAUZE/BANDAGES/DRESSINGS) IMPLANT
SUT ETHIBOND 2 0 SH (SUTURE)
SUT ETHIBOND 2 0 SH 36X2 (SUTURE) ×4 IMPLANT
SUT ETHILON 3 0 PS 1 (SUTURE) IMPLANT
SUT MNCRL AB 3-0 PS2 18 (SUTURE) IMPLANT
SUT MNCRL AB 4-0 PS2 18 (SUTURE) ×2 IMPLANT
SUT PROLENE 3 0 RB 1 (SUTURE) ×1 IMPLANT
SUT PROLENE 3 0 SH 1 (SUTURE) ×1 IMPLANT
SUT PROLENE 3 0 SH DA (SUTURE) ×1 IMPLANT
SUT PROLENE 4 0 SH DA (SUTURE) IMPLANT
SUT PROLENE 4 0 TF (SUTURE) ×2 IMPLANT
SUT PROLENE 5 0 C 1 24 (SUTURE) IMPLANT
SUT PROLENE 5 0 C 1 36 (SUTURE) ×2 IMPLANT
SUT PROLENE 6 0 CC (SUTURE) IMPLANT
SUT PROLENE 7 0 BV 1 (SUTURE) IMPLANT
SUT PROLENE 7 0 BV1 MDA (SUTURE) IMPLANT
SUT SILK 1 TIES 10X30 (SUTURE) ×1 IMPLANT
SUT SILK 2 0 SH CR/8 (SUTURE) ×2 IMPLANT
SUT SILK 2 0 TIES 17X18 (SUTURE)
SUT SILK 2-0 18XBRD TIE BLK (SUTURE) ×1 IMPLANT
SUT SILK 3 0 SH CR/8 (SUTURE) ×1 IMPLANT
SUT SILK 4 0 TIES 17X18 (SUTURE) ×1 IMPLANT
SUT STEEL 6MS V (SUTURE) ×1 IMPLANT
SUT STEEL STERNAL CCS#1 18IN (SUTURE) IMPLANT
SUT STEEL SZ 6 DBL 3X14 BALL (SUTURE) IMPLANT
SUT TEM PAC WIRE 2 0 SH (SUTURE) ×2 IMPLANT
SUT VIC AB 1 CT1 18XCR BRD 8 (SUTURE) IMPLANT
SUT VIC AB 1 CT1 8-18 (SUTURE)
SUT VIC AB 1 CTX 18 (SUTURE) ×2 IMPLANT
SUT VIC AB 1 CTX 27 (SUTURE) ×2 IMPLANT
SUT VIC AB 2-0 CT1 27 (SUTURE)
SUT VIC AB 2-0 CT1 TAPERPNT 27 (SUTURE) IMPLANT
SUT VIC AB 2-0 CTX 36 (SUTURE) ×1 IMPLANT
SUT VIC AB 3-0 SH 27 (SUTURE)
SUT VIC AB 3-0 SH 27X BRD (SUTURE) IMPLANT
SUT VIC AB 3-0 X1 27 (SUTURE) IMPLANT
SUT VICRYL 4-0 PS2 18IN ABS (SUTURE) IMPLANT
SYR 10ML KIT SKIN ADHESIVE (MISCELLANEOUS) IMPLANT
SYR 10ML LL (SYRINGE) ×6 IMPLANT
SYR 20CC LL (SYRINGE) ×2 IMPLANT
SYR 30ML LL (SYRINGE) ×2 IMPLANT
SYR 50ML LL SCALE MARK (SYRINGE) ×3 IMPLANT
SYSTEM SAHARA CHEST DRAIN ATS (WOUND CARE) ×1 IMPLANT
TOWEL GREEN STERILE (TOWEL DISPOSABLE) ×10 IMPLANT
TOWEL GREEN STERILE FF (TOWEL DISPOSABLE) ×1 IMPLANT
TRAY CATH LUMEN 1 20CM STRL (SET/KITS/TRAYS/PACK) IMPLANT
TRAY FOLEY MTR SLVR 16FR STAT (SET/KITS/TRAYS/PACK) ×3 IMPLANT
TUBE FEEDING 8FR 16IN STR KANG (MISCELLANEOUS) ×1 IMPLANT
TUBING HIGH PRESSURE 120CM (CONNECTOR) ×3 IMPLANT
WATER STERILE IRR 1000ML POUR (IV SOLUTION) ×2 IMPLANT
WIRE AMPLATZ SS-J .035X180CM (WIRE) ×2 IMPLANT
WIRE BENTSON .035X145CM (WIRE) ×2 IMPLANT
WIRE HI TORQ VERSACORE J 260CM (WIRE) ×2 IMPLANT
WIRE STIFF LUNDERQUIST 260CM (WIRE) ×2 IMPLANT

## 2017-09-08 NOTE — Anesthesia Procedure Notes (Signed)
Procedure Name: Intubation Date/Time: 09/08/2017 2:47 PM Performed by: Audry Pili, MD Pre-anesthesia Checklist: Patient identified, Emergency Drugs available, Suction available, Patient being monitored and Timeout performed Patient Re-evaluated:Patient Re-evaluated prior to induction Oxygen Delivery Method: Circle system utilized Preoxygenation: Pre-oxygenation with 100% oxygen Induction Type: IV induction Ventilation: Mask ventilation without difficulty and Oral airway inserted - appropriate to patient size Laryngoscope Size: Sabra Heck and 2 Grade View: Grade II Tube size: 7.5 mm Number of attempts: 1 Placement Confirmation: ETT inserted through vocal cords under direct vision,  positive ETCO2,  CO2 detector and breath sounds checked- equal and bilateral Tube secured with: Tape Dental Injury: Teeth and Oropharynx as per pre-operative assessment

## 2017-09-08 NOTE — Progress Notes (Signed)
  Echocardiogram 2D Echocardiogram has been performed.  Diana Walters 09/08/2017, 1:46 PM

## 2017-09-08 NOTE — Consult Note (Addendum)
[facility] Renal Consultation Note  Requesting MD: Dr. Vernie Murders Indication for Consultation: CKD V with arteriography planned.    HPI: Diana DESANTIAGO is a 62 y.o. 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 Given her aortic aneurysm history s/p multiple repairs, a CTA chest was performed which revealed an acute endoleak at the level of the aortic arch w/ extravasation of contrast material into the native aneurysm sac. The patient was seen by vascular and cardiothoracic surgery's (Dr.'s Donzetta Matters and Servando Snare) who are planning to proceed with arteriography and hopeful stenting following completion of her blood transfusion for profound anemia of 5.1g/dL and overall medical stabilization. They are hopeful to avoid median sternotomy w/ open repair as this will likely result in poor outcomes. Given the patients CKD stage V she will possibly require hemodialysis following the arteriography prompting consultation to nephrology.  The patient denied acute concerns in the room today. She stated that she was in agreement with proceeding with the possible procedure and that nephrology would be monitoring her kidney's while she was admitted. She follows with Dr. Joelyn Oms at [facility]. She denied headache, nausea, vomiting, diarrhea, constipation, abdominal pain, myalgias, fever, chills but endorsed weakness.   Creat  Date/Time Value Ref Range Status  06/10/2014 08:13 AM 1.10 0.50 - 1.10 mg/dL Final   Creatinine, Ser  Date/Time Value Ref Range Status  09/08/2017 06:09 AM 3.02 (H) 0.44 - 1.00 mg/dL Final  09/07/2017 08:09 PM 3.33 (H) 0.44 - 1.00 mg/dL Final  06/22/2017 11:06 AM 3.61 (H) 0.57 - 1.00 mg/dL Final  06/13/2017 04:37 AM 3.81 (H) 0.44 - 1.00 mg/dL Final   06/12/2017 02:54 AM 4.01 (H) 0.44 - 1.00 mg/dL Final  06/12/2017 02:54 AM 4.05 (H) 0.44 - 1.00 mg/dL Final  06/11/2017 02:30 AM 3.76 (H) 0.44 - 1.00 mg/dL Final  06/10/2017 02:44 AM 3.97 (H) 0.44 - 1.00 mg/dL Final  06/09/2017 02:30 AM 3.85 (H) 0.44 - 1.00 mg/dL Final  06/08/2017 01:50 AM 4.01 (H) 0.44 - 1.00 mg/dL Final  06/07/2017 04:29 AM 4.12 (H) 0.44 - 1.00 mg/dL Final  06/06/2017 01:15 AM 3.89 (H) 0.44 - 1.00 mg/dL Final  06/05/2017 08:52 AM 3.60 (H) 0.44 - 1.00 mg/dL Final  06/05/2017 08:11 AM 3.66 (H) 0.44 - 1.00 mg/dL Final  12/20/2016 11:45 AM 1.78 (H) 0.57 - 1.00 mg/dL Final  09/14/2016 10:26 AM 1.85 (H) 0.44 - 1.00 mg/dL Final  04/16/2016 10:54 AM 1.59 (H) 0.57 - 1.00 mg/dL Final  03/15/2016 10:57 AM 1.57 (H) 0.57 - 1.00 mg/dL Final  11/29/2012 11:00 AM 0.62 0.50 - 1.10 mg/dL Final  05/17/2012 11:14 AM 0.7 0.4 - 1.2 mg/dL Final  03/30/2012 09:19 PM 1.02 0.50 - 1.10 mg/dL Final  11/27/2010 10:58 AM 0.7 0.4 - 1.2 mg/dL Final  09/16/2009 09:35 AM 0.6 0.4 - 1.2 mg/dL Final  05/31/2008 08:35 AM 0.8 0.4 - 1.2 mg/dL Final  04/09/2007 01:20 PM 0.67  Final  04/08/2007 02:32 PM 1.0  Final  12/27/2006 04:19 PM 0.7 0.4 - 1.2 mg/dL Final  10/31/2006 11:00 AM 0.8 0.4 - 1.2 mg/dL Final  09/02/2006 10:06 PM 1.0  Final     PMHx:   Past Medical History:  Diagnosis Date  . ANEMIA 10/17/2009  . ANXIETY 10/28/2006  . Arthritis   . ASTHMA 10/28/2006   no inhalers  per pt  . Blood transfusion without reported diagnosis   . Depression   . Duodenitis without mention of hemorrhage 2005  . Heart murmur   . HYPERCHOLESTEROLEMIA 04/01/2008  . HYPERTENSION 10/28/2006  . Menopause    age 82  . Osteoporosis    bil knees  . SEIZURE DISORDER 04/01/2008   past- medications caused it   . Stroke ([place]) 11/2012  . 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 ESOPHAGOGASTRODUODENOSCOPY  01/29/2004  . LOOP RECORDER IMPLANT  12-01-2012   MDT LinQ  implanted by Dr Rayann Heman for cyrptogenic stroke  . LOOP RECORDER IMPLANT N/A 12/01/2012   Procedure: LOOP RECORDER IMPLANT;  Surgeon: Coralyn Mark, MD;  Location: [facility] CATH LAB;  Service: Cardiovascular;  Laterality: N/A;  . TEE WITHOUT CARDIOVERSION N/A 12/01/2012   Procedure: TRANSESOPHAGEAL ECHOCARDIOGRAM (TEE);  Surgeon: Fay Records, MD;  Location: [facility] ENDOSCOPY;  Service: Cardiovascular;  Laterality: N/A;  . VASCULAR SURGERY  8-9 years ago per patient   thoracic aortic anyuresm repair with patch     Family Hx:  Family History  Problem Relation Age of Onset  . Breast cancer Mother   . Heart disease Mother        before age 6  . Colon cancer Neg Hx   . Esophageal cancer Neg Hx   . Pancreatic cancer Neg Hx   . Rectal cancer Neg Hx   . Stomach cancer Neg Hx     Social History:  reports that she has been smoking cigarettes.  She has a 3.00 pack-year smoking history. She has never used smokeless tobacco. She reports that she drank alcohol. She reports that she has current or past drug history. Drug: Marijuana.  Allergies:  Allergies  Allergen Reactions  . Codeine Other (See Comments)    hallucinations  . Ibuprofen     Can't take due to medications    Medications: Prior to Admission medications   Medication Sig Start Date End Date Taking? Authorizing Provider  amLODipine (NORVASC) 5 MG tablet Take 1 tablet (5 mg total) by mouth daily. 07/05/17 10/03/17 Yes Daune Perch, NP  aspirin EC 81 MG tablet Take 1 tablet (81 mg total) by mouth daily. 06/22/17  Yes Daune Perch, NP  atorvastatin (LIPITOR) 40 MG tablet Take 1 tablet (40 mg total) by mouth daily. 06/22/17  Yes Daune Perch, NP  furosemide (LASIX) 40 MG tablet Take 1 tablet (40 mg total) by mouth daily. 06/22/17  Yes Daune Perch, NP  hydrALAZINE (APRESOLINE) 50 MG tablet Take 1.5 tablets (75 mg total) by mouth 3 (three) times daily. 06/22/17  Yes Daune Perch, NP  isosorbide mononitrate (IMDUR) 60 MG 24 hr tablet Take 1  tablet (60 mg total) by mouth daily. 06/22/17  Yes Daune Perch, NP  metoprolol succinate (TOPROL-XL) 25 MG 24 hr tablet Take 25 mg by mouth daily. Take with or immediately following a meal. Take with 50 mg to equal 75 mg daily.   Yes [provider]  sodium bicarbonate 650 MG tablet Take 650 mg by mouth 3 (three) times daily. 08/19/17  Yes [provider]  acetaminophen (TYLENOL) 325 MG tablet Take 650 mg by mouth every 6 (six) hours as needed.    [provider]  methotrexate 2.5 MG tablet Take 7.5 mg by mouth once a week. Friday    [provider]  Multiple Vitamin (MULTIVITAMIN) tablet Take 1 tablet by mouth daily.      [provider]  Simethicone (GAS-X PO) Take by mouth as directed.    [provider]    I have reviewed the patient's current medications.  Labs:  Results for orders placed or performed during the hospital encounter of 09/07/17 (from the past 48 hour(s))  Type and screen [place]     Status: None (Preliminary result)   Collection Time: 09/07/17  8:00 PM  Result Value Ref Range   ABO/RH(D) O POS    Antibody Screen NEG    Sample Expiration 09/10/2017    Unit Number [id number]    Blood Component Type RED CELLS,LR    Unit division 00    Status of Unit ISSUED,FINAL    Transfusion Status OK TO TRANSFUSE    Crossmatch Result      Compatible Performed at [facility], [place] [address] [place], [place] [address]    Unit Number [id number]    Blood Component Type RED CELLS,LR    Unit division 00    Status of Unit ISSUED    Transfusion Status OK TO TRANSFUSE    Crossmatch Result Compatible   Prepare RBC     Status: None   Collection Time: 09/07/17  8:00 PM  Result Value Ref Range   Order Confirmation      ORDER PROCESSED BY BLOOD BANK Performed at [facility], [place] [address] [place], [place] [address]   CBC with Differential/Platelet     Status: Abnormal   Collection Time:  09/07/17  8:09 PM  Result Value Ref Range   WBC 10.1 4.0 - 10.5 K/uL   RBC 1.98 (L) 3.87 - 5.11 MIL/uL   Hemoglobin 5.1 (LL) 12.0 - 15.0 g/dL    Comment: REPEATED TO VERIFY SPECIMEN CHECKED FOR CLOTS CRITICAL RESULT CALLED TO, READ BACK BY AND VERIFIED WITH: S [facility] 2111 09/07/2017 WBOND    HCT 17.5 (L) 36.0 - 46.0 %   MCV 88.4 78.0 - 100.0 fL   MCH 25.8 (L) 26.0 - 34.0 pg   MCHC 29.1 (L) 30.0 - 36.0 g/dL   RDW 23.0 (H) 11.5 - 15.5 %   Platelets 539 (H) 150 - 400 K/uL   Neutrophils Relative % 77 %   Lymphocytes Relative 14 %   Monocytes Relative 8 %   Eosinophils Relative 1 %   Basophils Relative 0 %   Neutro Abs 7.8 (H) 1.7 - 7.7 K/uL   Lymphs Abs 1.4 0.7 - 4.0 K/uL   Monocytes Absolute 0.8 0.1 - 1.0 K/uL   Eosinophils Absolute 0.1 0.0 - 0.7 K/uL   Basophils Absolute 0.0 0.0 - 0.1 K/uL   RBC Morphology MIXED RBC POPULATION     Comment: POLYCHROMASIA PRESENT Schistocytes present    WBC Morphology VACUOLATED NEUTROPHILS     Comment: Performed at [facility], [place] [address] [place], [place] [address]  Comprehensive metabolic panel     Status: Abnormal   Collection Time: 09/07/17  8:09 PM  Result Value Ref Range   Sodium 140 135 - 145 mmol/L   Potassium 4.5 3.5 - 5.1 mmol/L   Chloride 108 98 - 111 mmol/L   CO2 20 (L) 22 - 32 mmol/L   Glucose, Bld 87 70 - 99 mg/dL   BUN 54 (H) 8 - 23 mg/dL   Creatinine, Ser 3.33 (H) 0.44 - 1.00 mg/dL   Calcium 8.6 (L) 8.9 - 10.3 mg/dL   Total Protein 7.9 6.5 - 8.1 g/dL   Albumin 2.7 (L) 3.5 -  5.0 g/dL   AST 19 15 - 41 U/L   ALT 12 0 - 44 U/L   Alkaline Phosphatase 57 38 - 126 U/L   Total Bilirubin 0.5 0.3 - 1.2 mg/dL   GFR calc non Af Amer 14 (L) >60 mL/min   GFR calc Af Amer 16 (L) >60 mL/min    Comment: (NOTE) The eGFR has been calculated using the CKD EPI equation. This calculation has not been validated in all clinical situations. eGFR's persistently <60 mL/min signify possible Chronic Kidney Disease.    Anion gap  12 5 - 15    Comment: Performed at [facility] [address], [place], [place] [address]  Protime-INR     Status: None   Collection Time: 09/07/17  8:09 PM  Result Value Ref Range   Prothrombin Time 14.0 11.4 - 15.2 seconds   INR 1.09     Comment: Performed at [facility] [address] [place], [place] [address]  PTT     Status: None   Collection Time: 09/07/17  8:09 PM  Result Value Ref Range   aPTT 27 24 - 36 seconds    Comment: Performed at [facility] [address] [place], [place] [address]  Vitamin B12     Status: None   Collection Time: 09/07/17  8:09 PM  Result Value Ref Range   Vitamin B-12 442 180 - 914 pg/mL    Comment: (NOTE) This assay is not validated for testing neonatal or myeloproliferative syndrome specimens for Vitamin B12 levels. Performed at [facility], [place] [address] [place], [place] [address]   Folate     Status: None   Collection Time: 09/07/17  8:09 PM  Result Value Ref Range   Folate 8.9 >5.9 ng/mL    Comment: Performed at [facility], [place] [address], [place], [place] [address]  Iron and TIBC     Status: Abnormal   Collection Time: 09/07/17  8:09 PM  Result Value Ref Range   Iron 9 (L) 28 - 170 ug/dL   TIBC 224 (L) 250 - 450 ug/dL   Saturation Ratios 4 (L) 10.4 - 31.8 %   UIBC 215 ug/dL    Comment: Performed at [facility], [place] [address] [place], [place] [address]  Ferritin     Status: Abnormal   Collection Time: 09/07/17  8:09 PM  Result Value Ref Range   Ferritin 343 (H) 11 - 307 ng/mL    Comment: Performed at [facility], [place] [address] [place], [place] [address]  Reticulocytes     Status: Abnormal   Collection Time: 09/07/17  8:09 PM  Result Value Ref Range   Retic Ct Pct 3.7 (H) 0.4 - 3.1 %   RBC. 1.99 (L) 3.87 - 5.11 MIL/uL   Retic Count, Absolute 73.6 19.0 - 186.0 K/uL    Comment: Performed at [facility] [address], [place],  [address]  POC occult blood,  ED     Status: None   Collection Time: 09/07/17  9:39 PM  Result Value Ref Range   Fecal Occult Bld NEGATIVE NEGATIVE  Urinalysis, Routine w reflex microscopic     Status: Abnormal   Collection Time: 09/07/17 11:40 PM  Result Value Ref Range   Color, Urine YELLOW YELLOW   APPearance CLEAR CLEAR   Specific Gravity, Urine 1.014 1.005 - 1.030   pH 6.0 5.0 - 8.0   Glucose,  UA NEGATIVE NEGATIVE mg/dL   Hgb urine dipstick NEGATIVE NEGATIVE   Bilirubin Urine NEGATIVE NEGATIVE   Ketones, ur NEGATIVE NEGATIVE mg/dL   Protein, ur 100 (A) NEGATIVE mg/dL   Nitrite NEGATIVE NEGATIVE   Leukocytes, UA NEGATIVE NEGATIVE   RBC / HPF 0-5 0 - 5 RBC/hpf   WBC, UA 0-5 0 - 5 WBC/hpf   Bacteria, UA NONE SEEN NONE SEEN   Squamous Epithelial / LPF 0-5 0 - 5    Comment: Performed at [facility], [place] [address] [place], [place] [address]  I-Stat Troponin, ED (not at [facility])     Status: None   Collection Time: 09/08/17 12:04 AM  Result Value Ref Range   Troponin i, poc 0.00 0.00 - 0.08 ng/mL   Comment 3            Comment: Due to the release kinetics of cTnI, a negative result within the first hours of the onset of symptoms does not rule out myocardial infarction with certainty. If myocardial infarction is still suspected, repeat the test at appropriate intervals.   Troponin I     Status: None   Collection Time: 09/08/17  1:33 AM  Result Value Ref Range   Troponin I <0.03 <0.03 ng/mL    Comment: Performed at [facility] [address] [place], [place] [address]  Glucose, capillary     Status: None   Collection Time: 09/08/17  6:08 AM  Result Value Ref Range   Glucose-Capillary 74 70 - 99 mg/dL   Comment 1 Capillary Specimen   Basic metabolic panel     Status: Abnormal   Collection Time: 09/08/17  6:09 AM  Result Value Ref Range   Sodium 141 135 - 145 mmol/L   Potassium 4.8 3.5 - 5.1 mmol/L   Chloride 111 98 - 111 mmol/L   CO2 16 (L) 22 - 32 mmol/L   Glucose, Bld 68 (L) 70 - 99  mg/dL   BUN 50 (H) 8 - 23 mg/dL   Creatinine, Ser 3.02 (H) 0.44 - 1.00 mg/dL   Calcium 8.1 (L) 8.9 - 10.3 mg/dL   GFR calc non Af Amer 15 (L) >60 mL/min   GFR calc Af Amer 18 (L) >60 mL/min    Comment: (NOTE) The eGFR has been calculated using the CKD EPI equation. This calculation has not been validated in all clinical situations. eGFR's persistently <60 mL/min signify possible Chronic Kidney Disease.    Anion gap 14 5 - 15    Comment: Performed at [facility] [address] [place], [place] [address]  CBC     Status: Abnormal   Collection Time: 09/08/17  6:09 AM  Result Value Ref Range   WBC 9.9 4.0 - 10.5 K/uL   RBC 2.94 (L) 3.87 - 5.11 MIL/uL   Hemoglobin 8.0 (L) 12.0 - 15.0 g/dL    Comment: POST TRANSFUSION SPECIMEN DELTA CHECK NOTED    HCT 25.6 (L) 36.0 - 46.0 %   MCV 87.1 78.0 - 100.0 fL   MCH 27.2 26.0 - 34.0 pg   MCHC 31.3 30.0 - 36.0 g/dL   RDW 18.2 (H) 11.5 - 15.5 %   Platelets 385 150 - 400 K/uL    Comment: Performed at [facility], [place] [address] [place], [place] [address]  Magnesium     Status: None   Collection Time: 09/08/17  6:09 AM  Result Value Ref Range   Magnesium 2.1 1.7 - 2.4  mg/dL    Comment: Performed at [facility], [place] [address] [place], [place] [address]  Phosphorus     Status: Abnormal   Collection Time: 09/08/17  6:09 AM  Result Value Ref Range   Phosphorus 5.1 (H) 2.5 - 4.6 mg/dL    Comment: Performed at [facility] [address] [place], [place] [address]  Glucose, capillary     Status: None   Collection Time: 09/08/17  7:37 AM  Result Value Ref Range   Glucose-Capillary 80 70 - 99 mg/dL   Comment 1 Capillary Specimen      IFO:YDXAJOINO items are noted in HPI.  Physical Exam: Vitals:   09/08/17 0800 09/08/17 0900  BP: (!) 144/76 (!) 152/66  Pulse:    Resp: 13 10  Temp:  (!) 97.2 F (36.2 C)  SpO2: 97% 98%     Physical Exam  Constitutional: She is oriented to person, place, and time. She  appears well-nourished. No distress.  HENT:  Head: Atraumatic.  Cardiovascular: Normal rate and regular rhythm.  Murmur heard. Pulmonary/Chest: Effort normal and breath sounds normal. No respiratory distress.  Abdominal: Soft. Bowel sounds are normal. She exhibits no distension. There is no tenderness.  Musculoskeletal: She exhibits no edema.  Neurological: She is alert and oriented to person, place, and time.  Skin: Skin is warm. Capillary refill takes less than 2 seconds. She is not diaphoretic.  Vitals reviewed.   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 She was noted to have an aortic leak and prepared for stenting vs surgical intervention depending on the severity of the leak as per vascular/CT surgery's notes.   Assessment/Plan: 1. Renal - CKD stage V - See's [facility], last encounter on 07/24. 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 Was scheduled for a renal biopsy but this was canceled as per patient due to hypertension. She was to see [facility] for AVF placement in the upcoming month as per nephrologies last outpatient note given her worsening renal function. WE will monitor this post procedure and for consideration of advancing treatment based on renal function and outcomes.  2. Hypertension/volume  - HTN persistent since admission. Please closely follow with vascular surgery for recommendation regarding treatment but I feel she will need tight BP control moving forward. May need to consider continuing home antihypertensives.  3. Thoracic aortic aneurysm and perforation - As per vascular surgery, see above. Will follow for renal needs following  procedure.  4.  Anemia, normocytic, acute on chronic - Acute loss most likely secondary to internal hemorrhage vs hemolysis without overt signs of the latter. Patients Hgb 5.1 prior to transfusion of two units PRBC's. Post transfusion Hgb >8.0. Continue to monitor. Preadmission levels appear to be ~9-10. Iron studies with iron deficiency with Iron of 9, saturation of 4% and TIBC of 224 with a ferritin 343. Would consider ESA with HD as well as Vit D/Ca after iron replacement.   Kathi Ludwig, MD Internal Medicine PGY-2  I agree with Dr Parke Simmers note and saw and examined the patient  The decline in renal function is not clear and was scheduled for a renal biopsy although this was not done. At this point, the  patient is admitted for a cardiovascular emergency and a renal biopsy is unlikely to change her management and would therefore defer this to another time. I think there is a danger of acute on chronic renal failure and the patient will have to be managed carefully. Her volume status is good and agree that control of blood pressure is likely to be key as well as volume control and management of anemia. She is being transfused so that intervention can be done with repair of leaking thoracic aneurysm. Access planning is also underway  Thank you very much for allowing [place] to participate in the care of Ms Kauffmann

## 2017-09-08 NOTE — ED Notes (Signed)
Contacted [facility], Dr. Ysidro Evert vascular

## 2017-09-08 NOTE — H&P (Addendum)
PULMONARY / CRITICAL CARE MEDICINE   Name: Diana Walters MRN: [id number] DOB: 14-Sep-1955    ADMISSION DATE:  09/07/2017 CONSULTATION DATE:  09/08/2017  REFERRING MD:  Dr. 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  Lives at home with her daughter and grandson.  Uses a cane occasionally with arthritis flares otherwise independent in ADLs.  Progressive weakness since April after hospitalization.  Was advised to go to the ER by her doctor after outpatient labs noted for Hgb of 4.6.  Complained of dull like intermittent gas pains in between shoulder blades radiating to front of chest for 2 weeks.  Has taken gas-x but didn't really help and pain aggravated around meal times.  Reports some dark stools with diarrhea a few days ago but that since has resolved and now has formed Arenson stools. Takes baby aspirin daily, denies regular NSAID use.  Reports unintentional weight loss since last admission.  On chart review, patient down 17 lbs since April.   In ER, hemodynamically stable.  Labs noted for Hgb 5.1 ( down from 10.3 on 5/8), sCr 3.33 (consistent w/baseline 3.6-4.12 in 2019), troponin <0.03, Plts 539, normal coags, hemoccult negative.  CTA chest report of evidence of acute endoleak at the level of the aortic arch with active extravasation of contrast into the native aneurysm sac causing enlargement of the native sac with extensive hematoma in the mediastinum around the arch and descending aorta, and small left pleural effusion.  Vascular was consulted.  [facility] not accepting patient back; apparently patient was noncompliant with her follow-up at [facility].  Vascular to consult with CVTS in the morning.  Patient  transfused 2 units of PRBC.  Additionally, patient placed on contact precautions in ER after removing bed bugs from hair bonnet.  PCCM asked to admit to ICU.   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  PAST SURGICAL HISTORY: She  has a past surgical history that includes Esophagogastroduodenoscopy (01/29/2004); TEE without cardioversion (N/A, 12/01/2012); Loop recorder implant (12-01-2012); and loop recorder implant (N/A, 12/01/2012).  Allergies  Allergen Reactions  . Codeine Other (See Comments)    hallucinations  . Ibuprofen     Can't take due to medications    No current facility-administered medications on file prior to encounter.    Current Outpatient Medications on File Prior to Encounter  Medication Sig  . acetaminophen (TYLENOL) 325 MG tablet Take 650 mg by mouth every 6 (six) hours as needed.  Marland Kitchen amLODipine (NORVASC) 5 MG tablet Take 1 tablet (5 mg total) by mouth daily.  Marland Kitchen aspirin EC 81 MG tablet Take 1 tablet (81 mg total) by mouth daily.  Marland Kitchen atorvastatin (LIPITOR) 40 MG tablet Take 1 tablet (40 mg total) by mouth daily.  . furosemide (LASIX) 40 MG tablet Take 1 tablet (40 mg total) by mouth daily.  . hydrALAZINE (APRESOLINE) 50 MG tablet Take 1.5 tablets (75 mg total) by mouth 3 (three) times daily.  . isosorbide mononitrate (IMDUR) 60 MG 24  hr tablet Take 1 tablet (60 mg total) by mouth daily.  . methotrexate 2.5 MG tablet Take 7.5 mg by mouth once a week. Friday  . metoprolol succinate (TOPROL-XL) 25 MG 24 hr tablet Take 25 mg by mouth daily. Take with or immediately following a meal. Take  with 50 mg to equal 75 mg daily.  . Multiple Vitamin (MULTIVITAMIN) tablet Take 1 tablet by mouth daily.    . Simethicone (GAS-X PO) Take by mouth as directed.  . sodium bicarbonate 650 MG tablet Take 650 mg by mouth 3 (three) times daily.    FAMILY HISTORY:  Her family history includes Breast cancer in her mother; Heart disease in her mother. There is no history of Colon cancer, Esophageal cancer, Pancreatic cancer, Rectal cancer, or Stomach cancer.  SOCIAL HISTORY: She  reports that she has been smoking cigarettes.  She has a 6.00 pack-year smoking history. She has never used smokeless tobacco. She reports that she drinks alcohol. She reports that she has current or past drug history. Drug: Marijuana.  REVIEW OF SYSTEMS:  POSITIVES IN BOLD Gen: Denies fever, chills, weight loss , fatigue, night sweats HEENT: Denies vision changes, sinus congestion, sore throat, neck stiffness, dysphagia PULM: Denies intermittent shortness of breath, cough, sputum production, hemoptysis, wheezing CV: Denies chest pain, edema, orthopnea, palpitations, back pain GI: Denies abdominal pain, nausea, vomiting, diarrhea,change in bowel habits GU: Denies dysuria, hematuria, polyuria, oliguria Endocrine: Denies hot or cold intolerance, polyuria, polyphagia or appetite change Derm: Denies rash or skin change Heme: Denies easy bruising, bleeding Neuro: Denies headache, numbness, weakness, slurred speech, syncope   SUBJECTIVE:  Currently complaining of 8.5 pain in left hip  VITAL SIGNS: BP (!) 145/79   Pulse 74   Temp 98.6 F (37 C) (Oral)   Resp (!) 28   Ht 5\' 10"  (1.778 m)   Wt 133 lb (60.3 kg)   SpO2 96%   BMI 19.08 kg/m   HEMODYNAMICS:   VENTILATOR SETTINGS:   INTAKE / OUTPUT: No intake/output data recorded.  PHYSICAL EXAMINATION: General:  Thin older adult female lying in ER stretcher in NAD HEENT: MM pink/moist, pupils 3/reactive, anicteric, +JVD  Neuro: Alert, oriented, MAE,  non-focal CV: IR RR, + systolic murmur, 2+ pulses PULM: even/non-labored, lungs bilaterally clear anteriorly, faint insp wheeze in left base XB:MWUX, non-tender, bs active  Extremities: warm/dry, +1 edema  Skin: no rashes or excoriations noted  LABS:  BMET Recent Labs  Lab 09/07/17 2009  NA 140  K 4.5  CL 108  CO2 20*  BUN 54*  CREATININE 3.33*  GLUCOSE 87    Electrolytes Recent Labs  Lab 09/07/17 2009  CALCIUM 8.6*    CBC Recent Labs  Lab 09/07/17 2009  WBC 10.1  HGB 5.1*  HCT 17.5*  PLT 539*    Coag's Recent Labs  Lab 09/07/17 2009  APTT 27  INR 1.09    Sepsis Markers No results for input(s): LATICACIDVEN, PROCALCITON, O2SATVEN in the last 168 hours.  ABG No results for input(s): PHART, PCO2ART, PO2ART in the last 168 hours.  Liver Enzymes Recent Labs  Lab 09/07/17 2009  AST 19  ALT 12  ALKPHOS 57  BILITOT 0.5  ALBUMIN 2.7*    Cardiac Enzymes Recent Labs  Lab 09/08/17 0133  TROPONINI <0.03    Glucose No results for input(s): GLUCAP in the last 168 hours.  STUDIES:  7/25 CTA chest >> 1. Aortic endograft extending from the ascending to the lower descending thoracic aorta. There is  evidence of acute endoleak at the level of the aortic arch with active extravasation of contrast material into the native aneurysm sac causing enlargement of the native sac. There is also extensive hematoma in the mediastinum around the arch and descending aorta.  2. Small left pleural effusion, possibly hemorrhagic, with atelectasis in the left base. 3. No evidence of aneurysm or dissection of the abdominal aorta.  CULTURES: none  ANTIBIOTICS: none  SIGNIFICANT EVENTS: 7/25 Admit  LINES/TUBES: PIV, 20g x 2  DISCUSSION: 62 year old presenting with symptomatic anemia, 5.1 on admit with concern for acute endovascular leak after prior complex thoracic aneurysm repair at [facility] in 2014.  Transfused 2 units PRBC. No obvious other source of bleeding,  hemoccult negative. Currently hemodynamically stable.    ASSESSMENT / PLAN:  PULMONARY A: Left pleural effusion- small, concern for hemorrhagic etiology vs transudate  Tobacco and marijuana abuse  - currently sats > 96% on room air  P:   Monitor Supplemental O2 prn  Cessation counseling Offered nicotine patch, patient ok without one at this time  CARDIOVASCULAR A:  Thoracic aneurysm s/p hybrid repair at [facility] 2014, tx to vascular care here 2016 w/ Dr. Trula Slade Hx HTN, HLD, murmur, HF - repeat TTE 09/06/17 with improvement in EF from 35-40 to 50-55%, G2DD, calcified aortic and mitral valves without stenosis; prior hx of small PFO found on TEE - EKG with SR with PAC in couplets; troponin neg P:  Tele monitoring Goal SBP < 160 per Vascular Goal HR <100 Vascular to consult CTVS in am for surgical plan Goal Hgb > 8 labetalol PRN, may consider esmolol gtt  Establish large bore PIV, hold on aline/ CVL for now until surgical plan Hold home lipitor, norvasc, ASA, lasix, apresoline, imdur, and toprol-xl   RENAL A:   CKD- appears stable, at risk for AKI s/p IV contrast P:   NS at 125 ml/hr Trend BMP / mag/ phos/ daily wts/ urinary output PRN bladder scan Replace electrolytes as indicated Avoid nephrotoxic agents, ensure adequate renal perfusion   GASTROINTESTINAL A:   No acute issues - hemoccult neg P:   NPO for now Pepcid daily   HEMATOLOGIC A:   Symptomatic Anemia - 10.3 in May, now 5.1 - s/p 2 units PRBC P:  H/H q 4 Transfuse for Hgb < 7 SCD only  Trend CBC Assess HIV  INFECTIOUS A:   No acute concerns - on methotrexate P:   Monitor clinically Trend WBC/ fever curve On contact precautions due to concerns of bed bugs from patient's bonnet found in ER  ENDOCRINE A:   No issues  P:   CBG q 6 SSI sensitive   NEUROLOGIC A:   Hx of CVA without residual deficits per patient, arthritis osteo/ RA P:   ofirmev prn  Dilaudid prn for severe pain   Monitoring  Hold methotrexate   FAMILY  - Updates: No family at bedside.  States she has one child, a daughter Vanita Ingles for next of kin.  Another person, Dre, called who stated he was her son and a Environmental consultant, however patient states he is only a friend of her daughters.    - Inter-disciplinary family meet or Palliative Care meeting due by:  8/1  Kennieth Rad, AGACNP-BC [place] Pgr: [phone] or if no answer [phone] 09/08/2017, 5:03 AM

## 2017-09-08 NOTE — Anesthesia Procedure Notes (Addendum)
Central Venous Catheter Insertion Performed by: Audry Pili, MD, anesthesiologist Start/End7/25/2019 2:08 PM, 09/08/2017 2:11 PM Patient location: Pre-op. Preanesthetic checklist: patient identified, IV checked, risks and benefits discussed, surgical consent, monitors and equipment checked, pre-op evaluation, timeout performed and anesthesia consent Hand hygiene performed  and maximum sterile barriers used  Total catheter length 10. PA cath was placed.Swan type:thermodilution PA Cath depth:50 Procedure performed without using ultrasound guided technique. Attempts: 1 Patient tolerated the procedure well with no immediate complications.

## 2017-09-08 NOTE — Transfer of Care (Signed)
Immediate Anesthesia Transfer of Care Note  Patient: Diana Walters  Procedure(s) Performed: THORACIC AORTIC ENDOVASCULAR STENT GRAFT (N/A )  Patient Location: PACU  Anesthesia Type:General  Level of Consciousness: awake, alert , oriented and patient cooperative  Airway & Oxygen Therapy: Patient Spontanous Breathing and Patient connected to nasal cannula oxygen  Post-op Assessment: Report given to RN and Post -op Vital signs reviewed and stable  Post vital signs: Reviewed and stable  Last Vitals:  Vitals Value Taken Time  BP    Temp    Pulse    Resp    SpO2      Last Pain:  Vitals:   09/08/17 1200  TempSrc: Oral  PainSc:          Complications: No apparent anesthesia complications

## 2017-09-08 NOTE — ED Notes (Signed)
No phone calls per the patient.

## 2017-09-08 NOTE — Progress Notes (Signed)
Made Dr. Prescott Gum aware during his evening rounds that the patient's arterial line is reading 549I systolic and her BP cuff is reading 264B systolic. Instructed to follow the arterial line reading. Also made him aware of the ABG results. Orders for 1 amp of bicarb.

## 2017-09-08 NOTE — Anesthesia Procedure Notes (Addendum)
Central Venous Catheter Insertion Performed by: Audry Pili, MD, anesthesiologist Start/End7/25/2019 1:58 PM, 09/08/2017 2:10 AM Patient location: Pre-op. Preanesthetic checklist: patient identified, IV checked, risks and benefits discussed, surgical consent, monitors and equipment checked, pre-op evaluation, timeout performed and anesthesia consent Position: Trendelenburg Lidocaine 1% used for infiltration and patient sedated Hand hygiene performed , maximum sterile barriers used  and Seldinger technique used Catheter size: 8.5 Fr Total catheter length 10. Central line was placed.MAC introducer Swan type:thermodilution PA Cath depth:50 Procedure performed using ultrasound guided technique. Ultrasound Notes:anatomy identified, needle tip was noted to be adjacent to the nerve/plexus identified, no ultrasound evidence of intravascular and/or intraneural injection and image(s) printed for medical record Attempts: 1 Following insertion, line sutured, dressing applied and Biopatch. Post procedure assessment: blood return through all ports, free fluid flow and no air  Patient tolerated the procedure well with no immediate complications.

## 2017-09-08 NOTE — Progress Notes (Signed)
   I reexamined patient with Dr. Servando Snare.  We have reviewed the CT scan together.  We are in agreement that patient will need angiogram from a femoral approach to evaluate for cause of leak.  Hopefully this is a type III or possibly for leak which could be treated with relining of the graft at the aortic arch.  Worse case scenario this patient has a type I a leak which could require median sternotomy with open repair.  All of this is at risk to her kidneys and her life.  We have discussed this in detail she agrees to proceed.  Diana Jordan C. Donzetta Matters, MD [facility] Office: [phone] Pager: [phone]

## 2017-09-08 NOTE — Progress Notes (Signed)
Pt verbalized that she has not had any black tarry stools. RN will continue to monitor.

## 2017-09-08 NOTE — Consult Note (Signed)
Hospital Consult    Reason for Consult:  Thoracic aneurysm, back pain, anemia Referring Physician:  ED MRN #:  [id number]  History of Present Illness: This is a 62 y.o. 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  She is now sent here with anemia with outside hemoglobin less than 5.  States she has a greater than 3-week history of back pain between her scapula.  She also has chronic pain due to rheumatoid arthritis.  She has been feeling very weak for several weeks.  She has not had any evidence of bleeding in her urine or GI tract has had no vomiting.  She has been followed for endoleak here in the past.  States that she is no longer followed at [facility].  Past Medical History:  Diagnosis Date  . ANEMIA 10/17/2009  . ANXIETY 10/28/2006  . Arthritis   . ASTHMA 10/28/2006   no inhalers per pt  . Blood transfusion without reported diagnosis   . Depression   . Duodenitis without mention of hemorrhage 2005  . Heart murmur   . HYPERCHOLESTEROLEMIA 04/01/2008  . HYPERTENSION 10/28/2006  . Menopause    age 34  . Osteoporosis    bil knees  . SEIZURE DISORDER 04/01/2008  . Stroke ([place]) 11/2012  . 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 ESOPHAGOGASTRODUODENOSCOPY  01/29/2004  . 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 LOOP RECORDER IMPLANT N/A 12/01/2012   Procedure: LOOP RECORDER IMPLANT;  Surgeon: Coralyn Mark, MD;  Location: [facility] CATH LAB;  Service: Cardiovascular;  Laterality: N/A;  . TEE WITHOUT CARDIOVERSION N/A 12/01/2012   Procedure: TRANSESOPHAGEAL ECHOCARDIOGRAM (TEE);  Surgeon: Fay Records, MD;  Location: [facility] ENDOSCOPY;  Service: Cardiovascular;  Laterality: N/A;    Allergies  Allergen Reactions  . Codeine Other (See Comments)    hallucinations  . Ibuprofen     Can't take due to medications    Prior to Admission  medications   Medication Sig Start Date End Date Taking? Authorizing Provider  acetaminophen (TYLENOL) 325 MG tablet Take 650 mg by mouth every 6 (six) hours as needed.    [provider]  amLODipine (NORVASC) 5 MG tablet Take 1 tablet (5 mg total) by mouth daily. 07/05/17 10/03/17  Daune Perch, NP  aspirin EC 81 MG tablet Take 1 tablet (81 mg total) by mouth daily. 06/22/17   Daune Perch, NP  atorvastatin (LIPITOR) 40 MG tablet Take 1 tablet (40 mg total) by mouth daily. 06/22/17   Daune Perch, NP  furosemide (LASIX) 40 MG tablet Take 1 tablet (40 mg total) by mouth daily. 06/22/17   Daune Perch, NP  hydrALAZINE (APRESOLINE) 50 MG tablet Take 1.5 tablets (75 mg total) by mouth 3 (three) times daily. 06/22/17   Daune Perch, NP  isosorbide mononitrate (IMDUR) 60 MG 24 hr tablet Take 1 tablet (60 mg total) by mouth daily. 06/22/17   Daune Perch, NP  methotrexate 2.5 MG tablet Take 7.5 mg by mouth once a week. Friday    [provider]  metoprolol succinate (TOPROL-XL) 25 MG 24 hr tablet Take 25 mg by mouth daily. Take with or immediately following a meal. Take with 50 mg to equal 75 mg daily.    [provider]  Multiple Vitamin (MULTIVITAMIN) tablet Take 1 tablet by mouth daily.      [provider]  Simethicone (GAS-X PO) Take by mouth as directed.    [provider]  sodium bicarbonate 650 MG tablet Take 650 mg by mouth 3 (three) times daily. 08/19/17   [provider]    Social History   Socioeconomic History  . Marital status: Single    Spouse name: Not on file  . Number of children: Not on file  . Years of education: Not on file  . Highest education level: Not on file  Occupational History  . Not on file  Social Needs  . Financial resource strain: Not on file  . Food insecurity:    Worry: Not on file    Inability: Not on file  . Transportation needs:    Medical: Not on file    Non-medical: Not on file  Tobacco Use    . Smoking status: Current Every Day Smoker    Packs/day: 0.50    Years: 12.00    Pack years: 6.00    Types: Cigarettes  . Smokeless tobacco: Never Used  Substance and Sexual Activity  . Alcohol use: Yes    Alcohol/week: 0.0 oz  . Drug use: Yes    Types: Marijuana  . Sexual activity: Not on file  Lifestyle  . Physical activity:    Days per week: Not on file    Minutes per session: Not on file  . Stress: Not on file  Relationships  . Social connections:    Talks on phone: Not on file    Gets together: Not on file    Attends religious service: Not on file    Active member of club or organization: Not on file    Attends meetings of clubs or organizations: Not on file    Relationship status: Not on file  . Intimate partner violence:    Fear of current or ex partner: Not on file    Emotionally abused: Not on file    Physically abused: Not on file    Forced sexual activity: Not on file  Other Topics Concern  . Not on file  Social History Narrative  . Not on file     Family History  Problem Relation Age of Onset  . Breast cancer Mother   . Heart disease Mother        before age 64  . Colon cancer Neg Hx   . Esophageal cancer Neg Hx   . Pancreatic cancer Neg Hx   . Rectal cancer Neg Hx   . Stomach cancer Neg Hx     ROS:   Cardiovascular: [x]  chest pain/pressure []  palpitations []  SOB lying flat []  DOE []  pain in legs while walking []  pain in legs at rest []  pain in legs at night []  non-healing ulcers []  hx of DVT []  swelling in legs  Pulmonary: []  productive cough []  asthma/wheezing []  home O2  Neurologic: []  weakness in []  arms []  legs []  numbness in []  arms []  legs []  hx of CVA []  mini stroke [] difficulty speaking or slurred speech []  temporary loss of vision in one eye []  dizziness  Hematologic: []  hx of cancer []  bleeding problems []  problems with blood clotting easily  Endocrine:   []  diabetes []  thyroid disease  GI []  vomiting  blood []  blood in stool  GU: [x]  CKD/renal failure []  HD--[]  M/W/F or []  T/T/S []  burning with urination []  blood in urine  Psychiatric: []  anxiety []  depression  Musculoskeletal: []  arthritis []  joint pain  Integumentary: []  rashes []   ulcers  Constitutional: []  fever []  chills   Physical Examination  Vitals:   09/08/17 0135 09/08/17 0156  BP: (!) 130/57 (!) 136/58  Pulse: (!) 57 (!) 55  Resp: 17 14  Temp: 98.5 F (36.9 C) 98.6 F (37 C)  SpO2: 98% 97%   Body mass index is 19.08 kg/m.  General:  WDWN in NAD HENT: WNL, normocephalic Pulmonary: normal non-labored breathing Cardiac: Palpable radial pulses bilaterally Palpable femoral pulses palpable popliteal pulses palpable dorsalis pedis pulses bilaterally Abdomen: soft, NT/ND, no masses Extremities: She has no clubbing cyanosis or edema Musculoskeletal: no muscle wasting or atrophy  Neurologic: A&O X 3; Appropriate Affect ; SENSATION: normal; MOTOR FUNCTION:  moving all extremities equally. Speech is fluent/normal   CBC    Component Value Date/Time   WBC 10.1 09/07/2017 2009   RBC 1.98 (L) 09/07/2017 2009   RBC 1.99 (L) 09/07/2017 2009   HGB 5.1 (LL) 09/07/2017 2009   HGB 10.3 (L) 06/22/2017 1106   HCT 17.5 (L) 09/07/2017 2009   HCT 31.8 (L) 06/22/2017 1106   PLT 539 (H) 09/07/2017 2009   PLT 304 06/22/2017 1106   MCV 88.4 09/07/2017 2009   MCV 77 (L) 06/22/2017 1106   MCH 25.8 (L) 09/07/2017 2009   MCHC 29.1 (L) 09/07/2017 2009   RDW 23.0 (H) 09/07/2017 2009   RDW 21.7 (H) 06/22/2017 1106   LYMPHSABS 1.4 09/07/2017 2009   LYMPHSABS 1.9 01/19/2017 1128   MONOABS 0.8 09/07/2017 2009   EOSABS 0.1 09/07/2017 2009   EOSABS 0.3 01/19/2017 1128   BASOSABS 0.0 09/07/2017 2009   BASOSABS 0.0 01/19/2017 1128    BMET    Component Value Date/Time   NA 140 09/07/2017 2009   NA 141 06/22/2017 1106   K 4.5 09/07/2017 2009   CL 108 09/07/2017 2009   CO2 20 (L) 09/07/2017 2009   GLUCOSE 87  09/07/2017 2009   BUN 54 (H) 09/07/2017 2009   BUN 31 (H) 06/22/2017 1106   CREATININE 3.33 (H) 09/07/2017 2009   CREATININE 1.10 06/10/2014 0813   CALCIUM 8.6 (L) 09/07/2017 2009   CALCIUM 7.6 (L) 06/09/2017 0230   GFRNONAA 14 (L) 09/07/2017 2009   GFRAA 16 (L) 09/07/2017 2009    COAGS: Lab Results  Component Value Date   INR 1.09 09/07/2017   INR 1.26 06/05/2017   INR 1.19 09/14/2016     Non-Invasive Vascular Imaging:   IMPRESSION: 1. Aortic endograft extending from the ascending to the lower descending thoracic aorta. There is evidence of acute endoleak at the level of the aortic arch with active extravasation of contrast material into the native aneurysm sac causing enlargement of the native sac. There is also extensive hematoma in the mediastinum around the arch and descending aorta. 2. Small left pleural effusion, possibly hemorrhagic, with atelectasis in the left base. 3. No evidence of aneurysm or dissection of the abdominal aorta.    ASSESSMENT/PLAN: This is a 62 y.o. female with previous complex thoracic aortic aneurysm repair with aorto innominate and aorto left common carotid artery bypasses and thoracic endograft from her a sending aorta down to approximately the level of her diaphragm.  She has previously been seen for what appeared to be a type I a endoleak as well as a possible type III endoleak.  She now presents today with symptomatic anemia  and has been feeling weak for period of several weeks.  She also has at least a 3-week.  Of back pain between her scapula.  CT  angios performed today demonstrates what is read as an acute endoleak on the inner wall of the aortic arch.  The aneurysm has also significantly dilated with the native sac up to just under 7 cm by my measurement.  She has a small left pleural effusion.  I think this explains her pain but not necessarily her anemia given the small effusion in the time course of events over the past several weeks.  I  discussed her case with Dr. Ysidro Evert who performed her original repair approximately 10 years ago.  He is unwilling to accept her if she will not adhere to his follow-up guidelines in the future given that she has been lost to follow-up in the past.  At this time she does not think that she will be able to follow-up due to transportation issues.  Because of this we will admit her to critical care here.  She is on her second unit of transfusion and is hemodynamically stable currently.  I will review her CT scan with CT surgery in the morning and plan the best course of action.  Please keep the patient n.p.o..     Rashay Barnette C. Donzetta Matters, MD [facility] Office: [phone] Pager: [phone]

## 2017-09-08 NOTE — ED Notes (Signed)
Son Dre called, he is driving in to town to see his mother.  Wanted to inform us that "he is a Environmental consultant."  I replied "thanks" and patched him through to her room.

## 2017-09-08 NOTE — Op Note (Signed)
Patient name: Diana Walters MRN: [id number] DOB: Apr 28, 1955 Sex: female  09/08/2017 Pre-operative Diagnosis: Symptomatic thoracic aortic aneurysm with concern for contained rupture Post-operative diagnosis:  Same Surgeon:  Eda Paschal. Donzetta Matters, MD Co-surgeon: Ceasar Mons, MD Procedure Performed: 1.  Percutaneous access and closure of right common femoral artery 2.  Nonselective aortic cannulation with intravascular ultrasound of thoracic aorta 3.  Endovascular repair of aortic arch and descending thoracic aorta with proximal 34 x 200 mm extended into the descending aorta with 34 x 100 mm extension  Indications: 62 year old female has a history of extensive previous operations all performed at [facility].  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  She subsequently had TEVAR extending past her distal arch and later had a rupture requiring further endograft and distally to the level of the diaphragm.  She now presents with symptomatic anemia in 3-week history of back pain in her mid upper back and CT evidence of large endoleak on the inner curve of her aortic arch causing periaortic hematoma and small left pleural effusion.  She is now indicated for arch aortogram with possible endovascular versus open repair of her thoracic aortic aneurysm.  Findings: There was an apparent fracture of the stent strut on the inner curve of the aorta.  There was active extravasation at this level.  Following relining of the graft there was in-line flow from the area that had been replaced with the Dacron proximally all the way to the native aorta distal to the diaphragm.  There were strong signals in the bilateral dorsalis pedis arteries at completion.   Procedure:  The patient was identified in the holding area and taken to the operating room where she was placed supine on the operating table and general  endotracheal anesthesia was induced.  She was sterilely prepped and draped in the chest abdomen bilateral groins in the usual fashion given antibiotics and a timeout was called.  We began by using ultrasound to evaluate her right common femoral artery which was circumferentially diseased however not heavily calcified.  This was cannulated with micropuncture needle and a wire and sheath were placed.  I then made a skin neck dissected down to the top of the level of the artery which was heavily scarred.  A Bentson wire was placed we deployed 2 pro glide devices at 10:00 and 2:00.  We then placed an 8 French sheath and the patient was given 8000 units of heparin.  We then placed a Bentson wire and pigtail into the a sending aorta and performed angiogram in the left anterior oblique position.  This demonstrated our stent strut fracture as well as our extravasation on the inner curve.  We then performed IVIS of the thoracic aorta which demonstrated much of the same as well as a ballooned out graft at the aortic arch.  We were able to identify our aorto innominate and aorta carotid bypass origination.  We measured diameters there just above that bypass graft to have a 27 x 26mm diameter.  15 to 20 cm distally to that we had a 22 x 26 mm diameter.  With that we elected to place a 34 x 200 mm graft.  We exchanged for a Lunderquist wire.  We then serially dilated our wire tract up to a total of 24.  We then placed our 22 French sheath under direct fluoroscopic guidance and the patient was given additional 3000  units of heparin.  We then brought our first 34 x 200 graft into place.  This was deployed covering some of the previous varus transfer from the Medtronic graft in the ascending aorta.  The length of the outer curve did eat up some of our distance and so we then removed this graft and extended with a 34 x 100 mm graft distally.  These were all ballooned with a trilobed balloon.  Completion angiogram demonstrated no  further endoleak.  We are filling our branch vessel graft as well as her aorta distally.  There were no identifiable type II endoleak's with delay.  Satisfied with this we then removed our sheath under fluoroscopic guidance.  The pro glide devices were cinched down.  Patient had strong signals in her bilateral feet at the posterior tibial arteries and dorsalis pedis arteries.  50 mg of protamine was administered.  We then trimmed our suture closed the skin neck with 4-0 Monocryl and Dermabond was placed to level the skin.  She was then allowed away from anesthesia having tolerated procedure well without immediate complication.  All counts were correct at completion.  Next  EBL 250 cc.  Next  Contrast: 45 cc.   Brandon C. Donzetta Matters, MD [facility] Office: [phone] Pager: [phone]

## 2017-09-08 NOTE — Addendum Note (Signed)
Addendum  created 09/08/17 1810 by Audry Pili, MD   Intraprocedure Blocks edited, LDA updated via procedure documentation, Sign clinical note

## 2017-09-08 NOTE — Progress Notes (Signed)
CTS  Awake, comfortable No groin hematoma Postop ABG with base deficit -8 , bicarb ordered

## 2017-09-08 NOTE — Anesthesia Preprocedure Evaluation (Addendum)
Anesthesia Evaluation  Patient identified by MRN, date of birth, ID band Patient awake    Reviewed: Allergy & Precautions, NPO status , Patient's Chart, lab work & pertinent test results, reviewed documented beta blocker date and time   History of Anesthesia Complications Negative for: history of anesthetic complications  Airway Mallampati: II  TM Distance: >3 FB Neck ROM: Full    Dental  (+) Poor Dentition   Pulmonary COPD, Current Smoker,    breath sounds clear to auscultation       Cardiovascular hypertension, Pt. on medications and Pt. on home beta blockers + Peripheral Vascular Disease   Rhythm:Regular Rate:Normal   '19 TTE - Moderate concentric LVH. EF 50% to 55%. Grade 2 diastolic dysfunction. Trivial AI. Severe posterior MAC with mild MR. Severely dilated LA. RA was moderately dilated.  Moderate TR. PASP 32 mmHg.   EKG - SB 51bpm, LVH   Neuro/Psych Seizures -,  Anxiety Depression CVA    GI/Hepatic negative GI ROS, (+)     substance abuse  marijuana use,   Endo/Other  negative endocrine ROS  Renal/GU CRFRenal disease  negative genitourinary   Musculoskeletal  (+) Arthritis ,   Abdominal   Peds  Hematology  (+) anemia ,   Anesthesia Other Findings   Reproductive/Obstetrics                           Anesthesia Physical Anesthesia Plan  ASA: IV  Anesthesia Plan: General   Post-op Pain Management:    Induction: Intravenous  PONV Risk Score and Plan: 3 and Treatment may vary due to age or medical condition  Airway Management Planned: Oral ETT  Additional Equipment: Arterial line, CVP, PA Cath, TEE and Ultrasound Guidance Line Placement  Intra-op Plan: Utilization Of Total Body Hypothermia per surgeon request and Delibrate Circulatory arrest per surgeon request  Post-operative Plan: Post-operative intubation/ventilation  Informed Consent: I have reviewed the patients  History and Physical, chart, labs and discussed the procedure including the risks, benefits and alternatives for the proposed anesthesia with the patient or authorized representative who has indicated his/her understanding and acceptance.   Dental advisory given  Plan Discussed with: CRNA, Anesthesiologist and Surgeon  Anesthesia Plan Comments:        Anesthesia Quick Evaluation

## 2017-09-08 NOTE — Consult Note (Signed)
[address]       [place] [address]             [phone]        Ramie L Cowin [place] Medical Record #[id number] Date of Birth: 1955/11/02  Referring: No ref. provider found Primary Care: Renato Shin, MD Primary Cardiologist:Paula Harrington Challenger, MD  Chief Complaint:    Chief Complaint  Patient presents with  . Needs Blood Transfusion    History of Present Illness:     I was asked by Dr. Donzetta Matters to see [name].  She was seen in the cardiology office on July 23 in follow-up after admission in April 4431 for acute systolic congestive heart failure and acute renal failure and depressed LV function with ejection fraction of 35 to 45%.  She complained of gas-like pain between her shoulder blades on July 23 she noted that this is been going on for several weeks.  She was then referred to the emergency room for anemia.  Hemoglobin was down to 5.  In 1:00 AM this morning a CT scan was done suggesting increasing endoleak .  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  See notes below.  After transfusion of 2 units of packed red blood cells last night the patient feels better now.   Surgeries Performed: 04/24/2007 PROCEDURE:  1. Right Axillary Cannulation (CPT [id number])  2. Ascending Aortic Aneurysm Repair (#26 mm Dacron graft) (CPT 33860)  3. Transverse Aortic Arch Graft (Hemi-Arch) (#30 mm Dacron graft) with HCA/ACP Circulation Management (CPT [phone]) 4. Arch debranching procedure with ascending aorta to innominate artery (CPT [phone]) and left common carotid artery (CPT [phone]) bypass (14 mm x 8 mm x 8 mm Vascutek trifurcated Dacron graft)   03/31/2012 1.) Endovascular repair of descending thoracic aneurysm (Gore C-TAG device: 28 mm x 15 cm device; 31 mm x 15 cm device) including radiologic supervision and interpretation  2.) Placement of proximal extension  prosthesis for endovascular repair of descending thoracic aneurysm (Gore C-TAG device: 34 mm x 10 cm device) including radiologic supervision and interpretation  3.) Open left femoral artery exposure by groin incision  4.) Percutaneous right femoral arterial access under fluoroscopic guidance  5.) Thoracic aortogram including radiologic supervision and interpretation  6.) Introduction of catheter aorta, bilateral  7.) Left tube thoracostomy under fluoroscopic imaging guidance 03/31/2012: 1.) Thoracoscopic left lung decortication, evacuation of hemothorax and chest tube placement       Current Activity/ Functional Status: Patient is not independent with mobility/ambulation, transfers, ADL's, IADL's.   Zubrod Score: At the time of surgery this patient's most appropriate activity status/level should be described as: []     0    Normal activity, no symptoms []     1    Restricted in physical strenuous activity but ambulatory, able to do out light work []     2    Ambulatory and capable of self care, unable to do work activities, up and about                 more than 50%  Of the time                            [x]     3    Only limited self care, in bed greater than 50% of waking hours []   4    Completely disabled, no self care, confined to bed or chair []     5    Moribund  Past Medical History:  Diagnosis Date  . ANEMIA 10/17/2009  . ANXIETY 10/28/2006  . Arthritis   . ASTHMA 10/28/2006   no inhalers per pt  . Blood transfusion without reported diagnosis   . Depression   . Duodenitis without mention of hemorrhage 2005  . Heart murmur   . HYPERCHOLESTEROLEMIA 04/01/2008  . HYPERTENSION 10/28/2006  . Menopause    age 5  . Osteoporosis    bil knees  . SEIZURE DISORDER 04/01/2008   past- medications caused it   . Stroke ([place]) 11/2012  . 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 ESOPHAGOGASTRODUODENOSCOPY  01/29/2004  . 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 LOOP RECORDER IMPLANT N/A 12/01/2012   Procedure: LOOP RECORDER IMPLANT;  Surgeon: Coralyn Mark, MD;  Location: [facility] CATH LAB;  Service: Cardiovascular;  Laterality: N/A;  . TEE WITHOUT CARDIOVERSION N/A 12/01/2012   Procedure: TRANSESOPHAGEAL ECHOCARDIOGRAM (TEE);  Surgeon: Fay Records, MD;  Location: [facility] ENDOSCOPY;  Service: Cardiovascular;  Laterality: N/A;  . VASCULAR SURGERY  8-9 years ago per patient   thoracic aortic anyuresm repair with patch     Social History   Tobacco Use  Smoking Status Current Every Day Smoker  . Packs/day: 0.25  . Years: 12.00  . Pack years: 3.00  . Types: Cigarettes  Smokeless Tobacco Never Used  Tobacco Comment   patient is ready to stop    Social History   Substance and Sexual Activity  Alcohol Use Not Currently  . Alcohol/week: 0.0 oz     Allergies  Allergen Reactions  . Codeine Other (See Comments)    hallucinations  . Ibuprofen     Can't take due to medications    Current Facility-Administered Medications  Medication Dose Route Frequency Provider Last Rate Last Dose  . 0.9 %  sodium chloride infusion   Intravenous Continuous Ashley Murrain, NP   Stopped at 09/08/17 [phone]  . 0.9 %  sodium chloride infusion  250 mL Intravenous PRN Hammonds, Sharyn Blitz, MD      . acetaminophen (OFIRMEV) IV 1,000 mg  1,000 mg Intravenous Q6H PRN Hammonds, Sharyn Blitz, MD 400 mL/hr at 09/08/17 0800    . famotidine (PEPCID) IVPB 20 mg premix  20 mg Intravenous Q24H Jennelle Human B, NP 100 mL/hr at 09/08/17 0800    . HYDROmorphone (DILAUDID) injection 0.5 mg  0.5 mg Intravenous Q3H PRN Hammonds, Sharyn Blitz, MD   0.5 mg at 09/08/17 0753  . insulin aspart (novoLOG) injection 1-3 Units  1-3 Units Subcutaneous Q6H Hammonds, Sharyn Blitz, MD      . labetalol (NORMODYNE,TRANDATE) injection 5-10 mg  5-10 mg Intravenous Q2H PRN Hammonds, Sharyn Blitz, MD      . ondansetron (ZOFRAN) injection  4 mg  4 mg Intravenous Q6H PRN Hammonds, Sharyn Blitz, MD        Medications Prior to Admission  Medication Sig Dispense Refill Last Dose  . amLODipine (NORVASC) 5 MG tablet Take 1 tablet (5 mg total) by mouth daily. 90 tablet 3 09/07/2017 at Unknown time  . aspirin EC 81 MG tablet Take 1 tablet (81 mg total) by mouth daily. 90 tablet 3 09/07/2017 at Unknown time  . atorvastatin (LIPITOR) 40 MG tablet Take 1 tablet (  40 mg total) by mouth daily. 90 tablet 3 09/07/2017 at Unknown time  . furosemide (LASIX) 40 MG tablet Take 1 tablet (40 mg total) by mouth daily. 90 tablet 3 09/07/2017 at Unknown time  . hydrALAZINE (APRESOLINE) 50 MG tablet Take 1.5 tablets (75 mg total) by mouth 3 (three) times daily. 405 tablet 3 09/07/2017 at Unknown time  . isosorbide mononitrate (IMDUR) 60 MG 24 hr tablet Take 1 tablet (60 mg total) by mouth daily. 90 tablet 3 09/07/2017 at Unknown time  . metoprolol succinate (TOPROL-XL) 25 MG 24 hr tablet Take 25 mg by mouth daily. Take with or immediately following a meal. Take with 50 mg to equal 75 mg daily.   09/07/2017 at Unknown time  . sodium bicarbonate 650 MG tablet Take 650 mg by mouth 3 (three) times daily.  6 09/07/2017 at Unknown time  . acetaminophen (TYLENOL) 325 MG tablet Take 650 mg by mouth every 6 (six) hours as needed.   Unknown at Unknown time  . methotrexate 2.5 MG tablet Take 7.5 mg by mouth once a week. Friday   Unknown at Unknown time  . Multiple Vitamin (MULTIVITAMIN) tablet Take 1 tablet by mouth daily.     Unknown at Unknown time  . Simethicone (GAS-X PO) Take by mouth as directed.   Unknown at Unknown time    Family History  Problem Relation Age of Onset  . Breast cancer Mother   . Heart disease Mother        before age 67  . Colon cancer Neg Hx   . Esophageal cancer Neg Hx   . Pancreatic cancer Neg Hx   . Rectal cancer Neg Hx   . Stomach cancer Neg Hx      Review of Systems:       Cardiac Review of Systems: Y or  [    ]= no  Chest Pain [  y   ]  Resting SOB [  y ] Exertional SOB  [  y]  Orthopnea [ y   Pedal Edema [ y  ]    Palpitations [  n] Syncope  [ n ]   Presyncope [ n  ]  General Review of Systems: [Y] = yes [  ]=no Constitional: recent weight change [  ]; anorexia [  ]; fatigue [  ]; nausea [  ]; night sweats [  ]; fever [  ]; or chills [  ]                                                               Dental: Last Dentist visit:   Eye : blurred vision [  ]; diplopia [   ]; vision changes [  ];  Amaurosis fugax[  ]; Resp: cough [  ];  wheezing[  ];  hemoptysis[  ]; shortness of breath[ y ]; paroxysmal nocturnal dyspnea[  y]; dyspnea on exertion[y  ]; or orthopnea[  ];  GI:  gallstones[  ], vomiting[  ];  dysphagia[  ]; melena[  ];  hematochezia [  ]; heartburn[  ];   Hx of  Colonoscopy[  ]; GU: kidney stones [  ]; hematuria[  ];   dysuria [  ];  nocturia[  ];  history of     obstruction [  ];  urinary frequency [  ]             Skin: rash, swelling[  ];, hair loss[  ];  peripheral edema[  ];  or itching[  ]; Musculosketetal: myalgias[ y ];  joint swelling[y  ];  joint erythema[ y ];  joint pain[y  ];  back pain[y  ];  Heme/Lymph: bruising[  ];  bleeding[  ];  anemia[  ];  Neuro: TIA[  ];  headaches[  ];  stroke[  ];  vertigo[  ];  seizures[  ];   paresthesias[  ];  difficulty walking[ y ];  Psych:depression[  ]; anxiety[  ];  Endocrine: diabetes[  ];  thyroid dysfunction[  ];              Physical Exam: BP (!) 157/78   Pulse 60   Temp 98.4 F (36.9 C) (Oral)   Resp 12   Ht 5\' 10"  (1.778 m)   Wt 133 lb (60.3 kg)   SpO2 97%   BMI 19.08 kg/m    General appearance: alert, cooperative, appears older than stated age and no distress Head: Normocephalic, without obvious abnormality, atraumatic Neck: no adenopathy, no carotid bruit, no JVD, supple, symmetrical, trachea midline and thyroid not enlarged, symmetric, no tenderness/mass/nodules Lymph nodes: Cervical, supraclavicular, and axillary nodes normal. Resp:  diminished breath sounds bibasilar Back: negative, symmetric, no curvature. ROM normal. No CVA tenderness. Cardio: Patient with bradycardia in the 50s sinus GI: soft, non-tender; bowel sounds normal; no masses,  no organomegaly Extremities: extremities normal, atraumatic, no cyanosis or edema and Chronic arthritic changes of joints, patient has palpable femoral DP and PT pulses bilaterally Neurologic: Grossly normal Patient has previous sternotomy incision team to the left, a right infraclavicular incision, bilateral groin incisions Diagnostic Studies & Laboratory data:     Recent Radiology Findings:   Dg Chest 2 View  Result Date: 09/07/2017 CLINICAL DATA:  Initial evaluation for acute shortness of breath. EXAM: CHEST - 2 VIEW COMPARISON:  Prior radiograph from earlier the same day. FINDINGS: Cardiomegaly again noted, stable. Median sternotomy wires underlying loop recorder. Aortic endograft again seen, stable in position and appearance. Increased soft tissue density again noted along the arch portion of the graft, similar to previous radiograph from earlier today, but new from prior radiograph from 06/09/2017. Lungs well inflated. Perihilar vascular congestion with interstitial prominence without frank pulmonary edema, somewhat increased from previous. No focal infiltrates. No pneumothorax. No acute osseous abnormality. IMPRESSION: 1. Increased soft tissue density along the superior margin/arch portion of the aortic stent endograft, similar relative to previous radiograph from earlier today, but new relative to 06/09/17. Again, further evaluation with CTA recommended. 2. Stable cardiomegaly with diffuse pulmonary interstitial congestion, increased from radiograph earlier today. Electronically Signed   By: Jeannine Boga M.D.   On: 09/07/2017 22:36   Dg Chest 2 View  Result Date: 09/07/2017 CLINICAL DATA:  Pain between shoulders EXAM: CHEST - 2 VIEW COMPARISON:  06/09/2017 FINDINGS: Cardiac  shadow is again enlarged. Loop recorder is noted. Postsurgical changes are seen. Thoracic aortic stent is again identified. Increased soft tissue density is noted along the aortic arch portion of the stent graft new from the prior exam. The patient positioning somewhat different than that seen on the prior exam although this increased soft tissue density is suspicious given the patient's clinical history of back pain. CT is recommended for further evaluation. The lungs are clear bilaterally. Postsurgical changes in the right subclavicular area are noted. No bony abnormality  is seen. IMPRESSION: Aortic stent graft is stable in appearance although increased soft tissue density is noted superior to the aortic arch portion of the graft. Given the patient's history of chest pain this is somewhat suspicious for possible expanding aneurysm. CT of the chest (ideally with contrast) is recommended for further evaluation. These results will be called to the ordering clinician or representative by the Radiologist Assistant, and communication documented in the PACS or zVision Dashboard. Electronically Signed   By: Inez Catalina M.D.   On: 09/07/2017 14:16   Ct Angio Chest/abd/pel For Dissection W And/or W/wo  Result Date: 09/08/2017 CLINICAL DATA:  History of thoracic aneurysm repair 10 years ago. Now with upper back pain and decreased hemoglobin. EXAM: CT ANGIOGRAPHY CHEST, ABDOMEN AND PELVIS TECHNIQUE: Multidetector CT imaging through the chest, abdomen and pelvis was performed using the standard protocol during bolus administration of intravenous contrast. Multiplanar reconstructed images and MIPs were obtained and reviewed to evaluate the vascular anatomy. CONTRAST:  43mL ISOVUE-370 IOPAMIDOL (ISOVUE-370) INJECTION 76%. Patient has decreased GFR but contrast material was administered due to emergent nature of the patient's condition. Patient was made aware risks and situation was discussed with nephrology. COMPARISON:  CT  chest 06/05/2017.  CT a chest 01/26/2016 FINDINGS: CTA CHEST FINDINGS Cardiovascular: Noncontrast images of the chest demonstrate an ascending and descending thoracic aortic endograft with mural calcification around the native aneurysm extending from the ascending to the lower descending aorta. There is some increased density within the hematoma in the native sac suggesting acute hemorrhage at the level of the aortic arch. Images obtained during arterial phase after contrast administration demonstrate patency of the endograft. Since the previous study, there is enlargement of the native sac at the level of the aortic arch with current measurement of 6.2 cm compared with 4.4 cm previously. There is active extravasation of contrast material into the native sac inferiorly and medial to the aortic arch consistent with acute endoleak. There is increase material in the mediastinum surrounding the aortic arch and descending aorta outside of the native aneurysm sac suggesting hematoma. Great vessel origins are patent. There is no dissection. Diffuse cardiac enlargement. No pericardial effusion. Central pulmonary arteries are well opacified without evidence of significant pulmonary embolus. Mediastinum/Nodes: Diffuse mediastinal hematoma surrounding the arch and descending aorta as discussed. No significant lymphadenopathy. Esophagus is decompressed. Lungs/Pleura: Small left pleural effusion, possibly hemorrhagic, with atelectasis in the left lung base. Right lung is clear. No pneumothorax. Airways are patent. Musculoskeletal: Degenerative changes in the spine. No destructive bone lesions. Sternotomy wires. Review of the MIP images confirms the above findings. CTA ABDOMEN AND PELVIS FINDINGS VASCULAR Aorta: Normal caliber abdominal aorta with diffuse calcification. No aneurysm or dissection. Celiac: Patent without evidence of aneurysm, dissection, vasculitis or significant stenosis. SMA: Patent without evidence of aneurysm,  dissection, vasculitis or significant stenosis. Renals: Duplicated renal arteries bilaterally. Renal arteries are patent without aneurysm or dissection. IMA: Patent without evidence of aneurysm, dissection, vasculitis or significant stenosis. Inflow: Patent without evidence of aneurysm, dissection, vasculitis or significant stenosis. Veins: No obvious venous abnormality within the limitations of this arterial phase study. Review of the MIP images confirms the above findings. NON-VASCULAR Hepatobiliary: No focal liver abnormality is seen. No gallstones, gallbladder wall thickening, or biliary dilatation. Pancreas: Unremarkable. No pancreatic ductal dilatation or surrounding inflammatory changes. Spleen: Normal in size without focal abnormality. Adrenals/Urinary Tract: No adrenal gland nodules. Cyst in the left kidney is unchanged. Renal nephrograms are symmetrical. No hydronephrosis or hydroureter. Bladder wall is  not thickened. Stomach/Bowel: Stomach, small bowel, and colon are not abnormally distended and are predominantly decompressed. No obvious wall thickening or inflammatory changes although decompression limits examination. Appendix is not definitively identified. Lymphatic: No significant lymphadenopathy. Reproductive: Uterus is diffusely enlarged and nodular with multiple calcifications consistent with uterine fibroids. No abnormal adnexal masses. Other: No free air or free fluid in the abdomen. Abdominal wall musculature appears intact. Musculoskeletal: Degenerative changes in the spine. No destructive bone lesions. Review of the MIP images confirms the above findings. IMPRESSION: 1. Aortic endograft extending from the ascending to the lower descending thoracic aorta. There is evidence of acute endoleak at the level of the aortic arch with active extravasation of contrast material into the native aneurysm sac causing enlargement of the native sac. There is also extensive hematoma in the mediastinum around  the arch and descending aorta. 2. Small left pleural effusion, possibly hemorrhagic, with atelectasis in the left base. 3. No evidence of aneurysm or dissection of the abdominal aorta. These results were discussed by telephone prior to the time of interpretation on 09/08/2017 at 1:12 am with PA. Langston Masker , who verbally acknowledged these results. Electronically Signed   By: Lucienne Capers M.D.   On: 09/08/2017 01:15     I have independently reviewed the above radiologic studies and discussed with the patient   Recent Lab Findings: Lab Results  Component Value Date   WBC 9.9 09/08/2017   HGB 8.0 (L) 09/08/2017   HCT 25.6 (L) 09/08/2017   PLT 385 09/08/2017   GLUCOSE 68 (L) 09/08/2017   CHOL 162 05/18/2016   TRIG 97 05/18/2016   HDL 48 05/18/2016   LDLDIRECT 74.3 11/27/2010   LDLCALC 95 05/18/2016   ALT 12 09/07/2017   AST 19 09/07/2017   NA 141 09/08/2017   K 4.8 09/08/2017   CL 111 09/08/2017   CREATININE 3.02 (H) 09/08/2017   BUN 50 (H) 09/08/2017   CO2 16 (L) 09/08/2017   TSH 0.587 06/07/2017   INR 1.09 09/07/2017   HGBA1C 5.8 (H) 11/30/2012      Assessment / Plan:   Extensive aortic disease, "mega-aorta" with 2 previous repairs in 2009 in 2014 now with evidence of increasing endoleak, appears centered on the distal aortic arch 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   Acute on chronic renal failure  History of acute systolic heart failure  Severe rheumatoid arthritis  I reviewed the CTs images and patient's history with Dr. Donzetta Matters.  With the mediastinal blood, agree with proceeding with arteriography identifying the exact cause of endoleak and hopefully reseal with additional stent grafts.  Open redo cardiac surgery on this patient would likely result in a poor outcome.  Risks and options including death infection stroke myocardial infarction bleeding blood transfusion, possible paralysis of the lower extremities more  threatened limb ischemia have all been discussed with the patient.  We will plan to proceed with Dr. Donzetta Matters today.       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  ANESTHESIOLOGIST: Responsible provider cannot be found from this context.  INDICATION FOR PROCEDURE: Ruptured thoracic aneurysm  DESCRIPTION OF PROCEDURE:  The patient is properly identified and brought to the operating room and moved to the operating table. A "time out" is performed confirming correct patient, procedure, and antibiotic administration. I Michelle Piper, MD was present for the entire operation. I acted as Building control surveyor together with the Scientific laboratory technician. The pathology involved vessels in both the thorax and the abdomen and periphery. The complexity of the procedure and the potential for Immediate vascular repair of different vascular beds mandated co-surgeons acting together for safe performance of the repair. March 31, 2012  The patient is brought emergently to the hybrid operating room and placed supine on the angiography table. The entire chest, abdomen and both groins are prepped with multiple layers of DuraPrep and draped as a sterile field. Because the previous operation utilized the right femoral artery, we make an oblique  incision in the left groin and expose the left femoral artery. The right femoral artery is accessed percutaneously. Some difficulty is encountered due to scarring from the previous surgery, but with several wire exchanges and dilatations, a 5-French sheath is successfully placed. A 7-French sheath is placed on the left side, and the patient is heparinized. Guidewires and catheters are then advanced to the arch from each access. There is a large aneurysm distal to the existing stent which requires some manipulation to ensure the wire and catheters pass into the existing device and onto the arch. When this is accomplished, the wire on the left side is exchanged for a Lunderquist wire, and a 22-French DrySeal sheath is advanced to the aorta from the left femoral artery. A marker arteriogram is then performed. 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 Two of the stents of the existing Medtronic device have a wide gap, and it is suspected that the distal device has migrated out of the proximal one. Initially a 28 mm x 15 cm CTAG device is deployed just proximal to the celiac axis to extend the existing stent. Next, a 31 x 15 CTAG component is deployed more proximally. This provides inadequate coverage of the modular disconnection, however, and a 3rd piece is required. For this we use a 34 x 10 component. The 3 new devices are then annealed with the Tri- Lobe balloon. A completion arteriogram is then performed, and this documents excellent seal of the modular disconnection as well as the aneurysm distal to the existing stent. The catheters and guidewires are then removed, and the femoral artery repaired with interrupted 5-0 Prolene sutures. Dr. Ysidro Evert then inserts a chest tube in the large left sanguinous pleural effusion. Correct position of the tube is confirmed with fluoroscopy. The groin wound is then closed in layers with Vicryl  sutures. The patient is taken to the cardiothoracic intensive care unit for postoperative monitoring.  Doc # [id number]  IMPLANTS:  Implant Name Type Inv. Item Serial No. Manufacturer Lot No. LRB No. Used Action  ENDOPROS, THORACIC TAG CONFORM [id number] - [id number] Donalds [id number] N/A 1 Implanted  ENDOPROS, THORACIC TAG CONFORM [id number] - [id number] N/A 1 Implanted  ENDOPROS, THORACIC TAG CONFORM [id number] - [id number]  [employer] [id number] N/A 1 Implanted   ATTESTATION:  I was present for the entire procedure in the role of co-surgeon. Multiple surgeons were required due to the complexity of the procedure. ATTESTATION:  TP- Surgery (Co-Surgeon) - I personally performed the entire procedure. It should be noted that Dr. Ysidro Evert served as the co-surgeon for entire procedure..   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  POST-OPERATIVE DIAGNOSIS: Same.   PROCEDURE:  1.) Endovascular repair of descending thoracic aneurysm (Gore C-TAG device: 28 mm x 15 cm device; 31 mm x 15 cm device) including radiologic supervision and interpretation (CPT [phone] & [id number])  2.) Placement of proximal extension prosthesis for endovascular repair of descending thoracic aneurysm (Gore C-TAG device: 34 mm x 10 cm device) including radiologic supervision and interpretation (CPT [id number] & [phone]) 3.) Open left femoral artery exposure by groin incision (CPT 34812)  4.) Percutaneous right femoral arterial access (CPT 36140) under fluoroscopic guidance (CPT 77001) 5.) Thoracic aortogram including radiologic supervision and interpretation (CPT 75605  & 62130)  6.) Introduction of catheter aorta, bilateral (CPT 36200-50)  7.) Left tube thoracostomy (CPT 32551) under fluoroscopic imaging guidance (CPT [phone])   CO-SURGEONS:  G. Mali Hughes, M.D.  Derenda Fennel, M.D.   ASSISTANT: Herminio Commons, M.D. (CT Resident)  ANESTHESIA: General endotracheal.   RISK STRATIFICATION:  Pre-operative risk evaluation.  This is a 62 year-old woman s/p prior TEVAR here at [facility] 5 years ago. She has been lost to follow-up and not seen in nearly two years. She presented to an OSH this evening with back pain and a ruptured descending thoracic aneurysm with large left hemothorax. The etiology of the rupture appears to be component separation with type III endoleak. She also has a distal type Ib endoleak which does not backfill the ruptured aneurysm sac. She is now taken emergently for endovascular repair.  SURGICAL PRIORITY: Emergent.  ASA CLASS: 4   SURGICAL INDICATIONS: Ruptured 7.2 cm descending thoracic aortic aneurysm with large left hemothorax.   PREPARATION:  The patient was taken to the operating room and placed supine on the operating room table. Prophylactic antibiotics were administered preoperatively. Invasive monitoring lines were placed by the Cardiothoracic Anesthesia team. SSEP and MEP monitoring was not used given the emergent nature of the case. The patient is prepped and draped from the chin to the mid thighs. Surgical time-out was performed.   OPERATIVE TECHNIQUE:  A 5 cm incision is made at the level of the inguinal ligament on the left. Dissection is carried down to the level of the common femoral artery. The artery is dissected free over a length of several centimeters. A #7-French sheath is placed through a separate stab incision into the left common femoral artery. The right common femoral artery is accessed using a micropuncture technique and a #5-French sheath placed under fluoroscopic guidance. The patient is systemically heparinized  with 5000 units of IV heparin.   A long Glidewire was passed up from the left-sided sheath and this wire is exchanged over a catheter for a Lunderquist LESDC stiff wire. The #7-French sheath is then exchanged for a #22-French Gore DrySeal sheath. Next, a long Glidewire was passed up from the right  groin sheath. A marker arteriogram catheter was then passed over this wire into the proximal descending thoracic aorta. The C-arm was then placed in appropriate LAO angulation to lay out the celiac axis and descending aorta. A marker thoracic arteriogram is shot to road-map the aneurysm location as well as the landing zones. The arteriogram clearly demonstrates a type III endoleak with component separation of two of the previously implanted Talent endografts due to distal device migration. The arteriogram also clearly demonstrates the area of aortic rupture adjacent to the type III endoleak.  Based on pre and intra-operative measurements, a Gore C-TAG 28 mm x 15 cm device is selected to be deployed distally. The device is passed up from the left groin and deployed with the distal end of the device just above the celiac axis in normal aorta. A 31 mm x 15 C-TAG device is then passed up and deployed with 5 cm of distal seal within the 28 mm device. This 31 mm device does not reach our intended proximal landing zone and thus a proximal extension prosthesis is required. A 34 mm x 10 cm Gore C-TAG device is passed up and deployed with 5 cm of distal seal in the 31 mm device and 5 cm of proximal seal in the existing proximal descending thoracic Talent endograft. The overlap and seal zones of the new C-TAG devices are then balloon molded using a 2nd generation Trilobe balloon.  Next, the marker arteriogram catheter is passed up through the C-TAG endografts and a completion thoracic arteriogram shot. This demonstrates no endoleak and complete exclusion of the aneurysm/rupture. A second arteriogram with more LAO angulation  confirms an excellent angiographic result. The introducer sheath is removed and the left common femoral artery clamped and repaired with interrupted 5-0 Prolene suture. There was an excellent pulse in the artery distal to the repair following repair completion. Protamine is administered and hemostasis is obtained. The groin incision is closed in multiple layers with Vicryl suture and the skin closed with a subcuticular absorbable stitch of 4-0 Monocryl. The right common femoral artery sheath is left in place to be removed in the ICU later when the patient's ACT has normalized. A sterile occlusive dressing is applied.  A 2 cm incision is then made in approximately the 6th ICS along the left anterior axillary line. Dissection is carried down to the rib and the pleural space entered. A large bloody pleural effusion is encountered and over 1L of bloody fluid is drained. A #24-Fr right angled chest tube is placed using fluoroscopy to assist with tube position. The tube is secured in place, connected to a Pleuravac, and a dressing applied. All sponge, needle, and instrument counts are reported correct at the end of the case.   COMPLICATIONS: There were no technical difficulties.   COMPLETION DETAIL: Completion angiography revealed no endoleak.   DISPOSITION: The patient was transported extubated to the [place] ICU in stable condition at the completion of the procedure.   ATTESTATION: I, G. Mali Hughes, attending co-surgeon, was scrubbed and present for the entire operative procedure. Dr. Verner Mould and I jointly performed the procedure and all of the critical components. Given the endovascular procedure involving the thoracic aorta with rupture, Cardiothoracic and Vascular co-surgeons were required by medical necessity.    Electronically signed by Gretchen Portela, MD at 03/31/2012 11:05 AM EST         Oros, Camani 04/24/2007 DOB: 05/07/1955 Age: 48 TSU Operative Report G. Mali Hughes, MD [facility]  Case Number  [facility]  Division of Thoracic Surgery   REFERRING PHYSICIAN: Lilia Argue. Servando Snare, M.D.   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   POST-OPERATIVE DIAGNOSIS: Same.   PROCEDURE:  1. Right Axillary Cannulation (CPT [id number])  2. Ascending Aortic Aneurysm Repair (#26 mm Dacron graft) (CPT 33860)  3. Transverse Aortic Arch Graft (Hemi-Arch) (#30 mm Dacron graft) with HCA/ACP Circulation Management (CPT [phone]) 4. Arch debranching procedure with ascending aorta to innominate artery (CPT [phone]) and left common carotid artery (CPT 35626) bypass (14 mm x 8 mm x 8 mm Vascutek trifurcated Dacron graft)   TIMES:  Cardiopulmonary bypass: 226 minutes  Aortic Cross-clamp: 74 minutes  HCA: 11 minutes  HCA/ACP: 15 minutes   SURGEONS: G. Mali Hughes, M.D. (Attending Surgeon)  Pernell Dupre, M.D. (CT Fellow)   ANESTHESIA: General endotracheal.   RISK STRATIFICATION:  PRE-OPERATIVE RISK EVALUATION.  This is a 62 year-old woman with malignant hypertension, rheumatoid arthritis, and asymptomatic "Mega Aorta syndrome" including ascending, transverse arch, and descending thoracic aneurysms. After work up, the decision was made to proceed with repair after a full discussion with the patient.   I estimated an overall perioperative mortality rate of approximately 3% and CVA/stroke risk of 3% for this 62 year-old. The patient understands the full risks and benefits of total proximal Thoracic Aortic surgery.   SURGICAL PRIORITY.  Elective.   ASA CLASS: 3   SURGICAL INDICATIONS:  NYHA: 1  Transverse Aortic Arch Graft (Hemi-Arch) Indications: 5.7 cm ascending aortic and proximal arch aneurysm.  Ascending Aortic Indications: Same. Clinical symptoms: none. Hemodynamic indications: none. No aortic insufficiency. Arch Debranching Indications: "Mega Aorta" with severe atherosclerotic change in transverse  arch and planned second stage endovascular repair of distal arch/descending aortic pathology.   FINDINGS:  Transverse Aortic Arch Graft (Hemi-Arch) Findings: 5.7 cm ascending aortic and transverse arch aneurysm with severe atherosclerotic change.   Aortic Root Findings: Tricuspid aortic valve with normal leaflets. Annular diameter: 23 mm. Sinus segment: non-aneurysmal with normal quality aorta; atherosclerotic change minimal below STJ. Sinotubular junction: 25 mm. 0+ aortic insufficiency. Ejection fraction (TEE): 55%.   Arch Debranching Findings: Innominate artery and left common carotid artery both free of disease beyond their origins and normal at level of arch debranching graft anastomoses.   PREPARATION:  This operation was done under continuous TEE and EEG monitoring. The patient was taken to the operating room and placed supine on the operating room table. Prophylactic antibiotics were administered preoperatively along with neuroprotective pharmacologic agents. Invasive monitoring lines including a Swan-Ganz catheter and bilateral radial arterial lines were placed by the Cardiothoracic Anesthesia team. The neck, chest, abdomen, and legs were prepped with Duraprep and draped in the usual sterile manner. Surgical time-out was performed.   Right Axillary Cannulation:  A 5-6 cm incision was made 1-2 finger breadths below the lateral 2/3rds of the right clavicle. Dissection was carried down to the pectoralis major fascia which was divided with cautery. The deltopectoral groove was entered and the clavipectoral fascia divided. The pectoralis minor muscle was partially divided with cautery. This afforded excellent exposure of the axillary artery. 3000 units of IV heparin were administered and the axillary artery clamped and opened. An 79mm Dacron graft was then anastomosed to the axillary artery in an end-to-side manner using 5-0 Prolene (side graft technique). The graft was de-aired and connected to the  arterial inflow line of the CPB circuit.   Cardiopulmonary Bypass:  A median sternotomy  was performed and the incision extended along the anterior border of the left sternocleidomastoid muscle; the heart was suspended in a pericardial cradle. The patient was fully systemically heparinized. Venous cannulation was accomplished using a right atrium location with a two-staged cannula. Using digital palpation and TEE, the ascending aorta was carefully evaluated. The ascending aorta was severely diseased and we felt that aortic cross-clamp was contraindicated. Further, the patient developed EEG changes suggestive of seizure activity after minimal manipulation/dissection of the ascending aorta. Deep systemic hypothermia was used with duration of cooling guided by neurocerebral monitoring with intraoperative EEG. The lowest systemic temperature was 22.7 degrees Centigrade core and 13.6 degrees Centigrade nasopharyngeal. The following vents were used for cardiac decompression: aortic root and right superior pulmonary vein.   Myocardial Protection:  Aortic occlusion was performed using a no clamp technique until after completion of the hemi-arch anastomosis. Cardioplegia was antegrade and retrograde delivered through the aortic root, coronary sinus, and direct ostial. Induction was performed using intermittent cold cardioplegia. Induction was performed with cold blood/crystalloid solution and high potassium. Maintenance was performed with cold blood/crystalloid solution and low potassium. Myocardial temperature fell to 7 degrees Centigrade. The heart was also cooled with topical iced saline. Quality of myocardial protection was excellent.   OPERATIVE TECHNIQUE:  Technical details of transverse aortic arch graft (Hemi-Arch). After completion of profound cooling and electrocerebral inactivity ([place]) by EEG, the circulation was stopped and the ascending aorta and proximal arch arch opened; the ascending aorta was not  clamped prior to this due to severe atherosclerotic change. The heart was then arrested using cardioplegia as described above. Dissection of the innominate and left common carotid arteries was then carried out during a brief period of circulatory arrest. The base of these arteries was then clamped and antegrade cerebral perfusion (ACP) initiated via the right axillary graft to a target right radial arterial line pressure of approximately 50-70 mm Hg at an inflow temperature of 12 degrees Centigrade in a slight Trendelenburg position. This allowed for a flow rate of approximately 5-15 cc/kg/min. The field was insufflated with CO2 at 6 L/minute during the entire open portion of the case to displace intracardiac/vascular air. Bright red blood was seen emanating very nicely from the orifice of the left subclavian artery indicating an intact Circle of Willis and adequate left cerebral hemisphere perfusion. The entire aortic arch was resected as distally as possible and sent to pathology for microscopic examination. A #30 mm Dacron graft was then used to reconstruct the aortic arch in an aggressive lHemi-Archm fashion. Utilizing a 4-0 BB Prolene running continuous suture, the arch reconstruction was performed. At completion of both the posterior and anterior suture lines, the aorta and graft were allowed to fill via the right axillary artery, after removal of the clamps on the proximal innominate and left common carotid arteries, to completely de-air the cerebral vessels and cardiovascular system. At this point, the clamp was placed on the graft just proximal to the arch anastomosis. Antegrade graft perfusion and CPB were then restarted. Full flow CPB at 12 degrees Centigrade (cold reperfusion) was performed for 5 minutes to allow free radicle washout and minimize reperfusion injury. The aortic arch anastomosis was then pressurized and checked and hemostasis found to be satisfactory. No felt was used. Attention was then  focused on the ascending aorta and root.   Technical details of the ascending aortic aneurysm repair. After the ascending aorta was opened and induction cardioplegia administration complete following completion of profound cooling and electrocerebral inactivity (  [place]) by EEG, the ascending aorta was opened and divided down to the right pulmonary artery. After completion of the hemi-arch anastomosis, attention was returnted to the ascending aorta, which was removed in its entirety from just above the sinotubular junction and the specimen sent to pathology for microscopic examination. The aortic valve was carefully inspected with findings as outlined above. The aortic valve was spared. A 26 mm Dacron graft was chosen for replacement of the ascending aorta from the sinotubular junction to the arch graft. The proximal end of the graft was then anastomosed to the sinotubular junction using running 4-0 Prolene suture on a BB needle. This completed the aortic root reconstruction and re-established normal sinotubular junction anatomy. After completing the proximal anastomosis, antegrade cardioplegia was directly administered into this lNeo-aortic root.m The proximal anastomosis was simultaneously checked for bleeding and found to be very satisfactory. In addition, the spared aortic valve appeared to be quite competent. Next, the graft to graft anastomosis of the proximal ascending graft to the distal arch graft was done using running 3-0 Prolene suture. This nicely simulated the normal curvature of the ascending aorta. After this graft to graft anastomosis was completed, the patient was placed into a deep Trendelenburg position for standard de-airing maneuvers. After careful maneuvers to evacuate air, the cross clamp was removed. The heart was defibrillated.   The ascending graft was then clamped using a partial clamp technique and the trifurcated 14/8/8 mm head vessel graft then trimmed to the appropriate length and  anastomosed end-to-side to the ascending graft with running 4-0 Prolene suture. [facility] locator radiographic markers were placed around the origin of the trifurcated head vessel graft to allow identification under fluoroscopy. The first 8 mm side limb of the trifurcated head vessel graft was clipped and oversewn as we did not re-implant the left subclavian artery. Next, the left common carotid is clamped proximally and distally and the second 8 mm side limb of the trifurcated graft sewn end-to-end to the divided left common carotid artery using running 5-0 Prolene. The stump of the left common carotid is oversewn with plegeted 4-0 Prolene suture. The graft and carotid are carefully de-aired and full flow to the left common carotid resumed; EEG monitoring remains normal during this time.   Ventricular pacing wires were placed and pacing initiated. A period of reperfusion and rewarming was performed and the left ventricular vent removed. Atrial pacing wires were placed and dual chamber pacing initiated. The patient is weaned off of CPB save for continued perfusion of the right axillary graft at 500-700 cc/min. The innominate is clamped proximally and distally and the 14 mm main body of the trifurcated graft sewn end-to-end to the divided innominate artery using running 5-0 Prolene. The stump of the innominate is oversewn with plegeted 4-0 Prolene suture. The graft and innominate are carefully de-aired and full flow to the innominate resumed; EEG monitoring initially shows some right sided slowing following innominate artery clamp, but this resolves with increasing the right brain flows via the pump.   COMPLETION: The patient weaned off CPB without difficulty on no inotropes. The patient was de-cannulated and heparin reversed with protamine. Hemostasis was achieved and was satisfactory. Mediastinal drainage tubes were placed and the sternum closed with stainless steel wires. Subcutaneous tissues were closed with  absorbable suture and the skin with staples. All sponge, needle, and instrument counts were reported correct at the end of the case.   COMPLICATIONS: There were no technical difficulties.   NOTES: Severely atherosclerotic ascending aorta  and transverse arch.   COMPLETION DETAIL: Postoperative TEE revealed normal left ventricular function and trace AI. Postoperative EEG returned symmetric to baseline.   DISPOSITION: The patient was transported while intubated to the 3200 ICU in critical condition at the completion of the procedure.   ATTESTATION STATEMENT: I, G. Mali Hughes, the attending cardiac surgeon, was present for the entire operative procedure.     G. Mali Hughes, MD Division of Cardiovascular and Thoracic Surgery ELECTRONICALLY SIGNED ON May 01, 2007 AT 5:38:27 PM [place] Dictated on: 04/24/2007 Transcribed on: 04/24/2007

## 2017-09-08 NOTE — Progress Notes (Signed)
[facility] P[facility]M AM Follow Up Note - see H&P from 0530 for full details.    Brief Summary: 62 y/o F with hx of complex hybrid thoracic aneurysm repair at [facility] (03/31/12), asthma, anemia, RA on methotrexate, smoker, marijuana abuse, HTN, CVA and seizures admitted early 7/25 am with generalized weakness since 05/2017 and mid-scapular back pain for three weeks.  Work up notable for CT which showed an acute endovascular leak at the level of the aortic arch with acute extravasation of contrast into the native aneurysmal sac and extensive hematoma in the mediastinum around the arch and descending aorta.  Hgb down to 5.1 from baseline of 9-10. She was transfused 2 units PRBC's.  VVS consulted (Dr. Donzetta Matters) and recommended stabilization prior to surgery. CVTS involved in her care as well.  After discussion, it was planned for femoral angiogram to evaluate for site of leak.     S:  Pt stable on RA.  No acute events overnight.  Hgb 0600 8.0.  Pt denies change in pain/shortness of breath.  States mouth dry.  Not sure what time she is going to surgery.   O: Blood pressure (!) 152/66, pulse 60, temperature (!) 97.2 F (36.2 C), temperature source Oral, resp. rate 10, height 5\' 10"  (1.778 m), weight 133 lb (60.3 kg), SpO2 98 %.  General:  Adult female in NAD HEENT: MM pink/moist, no jvd  Neuro: AAOx4, speech clear, MAE CV: s1s2 rrr, no m/r/g PULM: even/non-labored, lungs bilaterally clear YT:KZSW, non-tender, bsx4 active  Extremities: warm/dry, trace BLE edema  Skin: no rashes or lesions  Recent Labs  Lab 09/07/17 2009 09/08/17 0609  HGB 5.1* 8.0*  HCT 17.5* 25.6*  WBC 10.1 9.9  PLT 539* 385   Recent Labs  Lab 09/07/17 2009 09/08/17 0609  NA 140 141  K 4.5 4.8  CL 108 111  CO2 20* 16*  GLUCOSE 87 68*  BUN 54* 50*  CREATININE 3.33* 3.02*  CALCIUM 8.6* 8.1*  MG  --  2.1  PHOS  --  5.1*    A: Thoracic Aneurysm, concern for acute endovascular leak Hx HTN, HLD, Murmur, HF Left Pleural Effusion   Mediastinal Hematoma  Tobacco Abuse, Marijuana Abuse  CKD Anemia  RA on Methotrexate  Hx CVA - no residual deficit per patient  P: Await timing of angiogram  Appreciate VVS, CVTS  Trend Hgb Q4 for now  Transfuse for Hgb <7 or concern for active bleeding  If requires thoracic surgery, will follow up post procure for ICU / vent needs NS @ 125 ml/hr  Goal SBP < 160 Will continue to follow.  See full H&P  Noe Gens, NP-C [facility] Pgr: [phone] or if no answer [phone] 09/08/2017, 11:43 AM

## 2017-09-08 NOTE — Progress Notes (Signed)
Called Dr. Fransisco Beau to let him know that A line and BP cuff numbers did not match.  A line was 110's over 60's and BP cuff was 160's to 180's over 70's. He was ok with transport to 2H15.

## 2017-09-08 NOTE — Anesthesia Postprocedure Evaluation (Signed)
Anesthesia Post Note  Patient: Diana Walters  Procedure(s) Performed: THORACIC AORTIC ENDOVASCULAR STENT GRAFT (N/A )     Patient location during evaluation: PACU Anesthesia Type: General Level of consciousness: awake and alert Pain management: pain level controlled Vital Signs Assessment: post-procedure vital signs reviewed and stable Respiratory status: spontaneous breathing, nonlabored ventilation, respiratory function stable and patient connected to nasal cannula oxygen Cardiovascular status: blood pressure returned to baseline and stable Postop Assessment: no apparent nausea or vomiting Anesthetic complications: no    Last Vitals:  Vitals:   09/08/17 1200 09/08/17 1713  BP: (!) 154/80   Pulse:  76  Resp: 16 20  Temp: 36.7 C   SpO2: 99% 100%    Last Pain:  Vitals:   09/08/17 1713  TempSrc:   PainSc: 0-No pain                 Audry Pili

## 2017-09-08 NOTE — Anesthesia Procedure Notes (Signed)
Arterial Line Insertion Start/End7/25/2019 2:00 PM, 09/08/2017 2:00 PM Performed by: CRNA  Patient location: Pre-op. Preanesthetic checklist: patient identified, IV checked, site marked, risks and benefits discussed, surgical consent, monitors and equipment checked, pre-op evaluation, timeout performed and anesthesia consent Lidocaine 1% used for infiltration Left, radial was placed Catheter size: 20 G Hand hygiene performed  and maximum sterile barriers used   Attempts: 2 Procedure performed without using ultrasound guided technique. Following insertion, dressing applied and Biopatch. Post procedure assessment: normal  Patient tolerated the procedure well with no immediate complications.

## 2017-09-09 ENCOUNTER — Other Ambulatory Visit: Payer: Self-pay

## 2017-09-09 DIAGNOSIS — D649 Anemia, unspecified: Secondary | ICD-10-CM

## 2017-09-09 LAB — GLUCOSE, CAPILLARY
GLUCOSE-CAPILLARY: 87 mg/dL (ref 70–99)
Glucose-Capillary: 78 mg/dL (ref 70–99)
Glucose-Capillary: 93 mg/dL (ref 70–99)
Glucose-Capillary: 98 mg/dL (ref 70–99)

## 2017-09-09 LAB — POCT I-STAT 3, ART BLOOD GAS (G3+)
ACID-BASE DEFICIT: 7 mmol/L — AB (ref 0.0–2.0)
BICARBONATE: 18.5 mmol/L — AB (ref 20.0–28.0)
O2 SAT: 94 %
PH ART: 7.345 — AB (ref 7.350–7.450)
TCO2: 20 mmol/L — AB (ref 22–32)
pCO2 arterial: 33.8 mmHg (ref 32.0–48.0)
pO2, Arterial: 72 mmHg — ABNORMAL LOW (ref 83.0–108.0)

## 2017-09-09 LAB — CBC
HCT: 21.5 % — ABNORMAL LOW (ref 36.0–46.0)
Hemoglobin: 6.7 g/dL — CL (ref 12.0–15.0)
MCH: 27 pg (ref 26.0–34.0)
MCHC: 31.2 g/dL (ref 30.0–36.0)
MCV: 86.7 fL (ref 78.0–100.0)
Platelets: 275 10*3/uL (ref 150–400)
RBC: 2.48 MIL/uL — ABNORMAL LOW (ref 3.87–5.11)
RDW: 18.6 % — ABNORMAL HIGH (ref 11.5–15.5)
WBC: 10.6 10*3/uL — ABNORMAL HIGH (ref 4.0–10.5)

## 2017-09-09 LAB — PREPARE RBC (CROSSMATCH)

## 2017-09-09 LAB — COMPREHENSIVE METABOLIC PANEL
ALBUMIN: 2.2 g/dL — AB (ref 3.5–5.0)
ALT: 10 U/L (ref 0–44)
AST: 14 U/L — ABNORMAL LOW (ref 15–41)
Alkaline Phosphatase: 46 U/L (ref 38–126)
Anion gap: 9 (ref 5–15)
BILIRUBIN TOTAL: 0.5 mg/dL (ref 0.3–1.2)
BUN: 45 mg/dL — ABNORMAL HIGH (ref 8–23)
CO2: 20 mmol/L — ABNORMAL LOW (ref 22–32)
Calcium: 8 mg/dL — ABNORMAL LOW (ref 8.9–10.3)
Chloride: 112 mmol/L — ABNORMAL HIGH (ref 98–111)
Creatinine, Ser: 3.07 mg/dL — ABNORMAL HIGH (ref 0.44–1.00)
GFR calc Af Amer: 18 mL/min — ABNORMAL LOW (ref >60)
GFR, EST NON AFRICAN AMERICAN: 15 mL/min — AB (ref >60)
GLUCOSE: 71 mg/dL (ref 70–99)
POTASSIUM: 4.7 mmol/L (ref 3.5–5.1)
Sodium: 141 mmol/L (ref 135–145)
TOTAL PROTEIN: 6 g/dL — AB (ref 6.5–8.1)

## 2017-09-09 LAB — HEMOGLOBIN AND HEMATOCRIT, BLOOD
HCT: 30.9 % — ABNORMAL LOW (ref 36.0–46.0)
Hemoglobin: 9.9 g/dL — ABNORMAL LOW (ref 12.0–15.0)

## 2017-09-09 MED ORDER — METOPROLOL TARTRATE 12.5 MG HALF TABLET
12.5000 mg | ORAL_TABLET | Freq: Two times a day (BID) | ORAL | Status: DC
Start: 2017-09-09 — End: 2017-09-11
  Administered 2017-09-09 – 2017-09-11 (×3): 12.5 mg via ORAL
  Filled 2017-09-09 (×3): qty 1

## 2017-09-09 MED ORDER — ALBUMIN HUMAN 5 % IV SOLN
12.5000 g | Freq: Once | INTRAVENOUS | Status: AC
  Administered 2017-09-09: 12.5 g via INTRAVENOUS
  Filled 2017-09-09: qty 250

## 2017-09-09 MED ORDER — MAGNESIUM SULFATE 2 GM/50ML IV SOLN
2.0000 g | Freq: Once | INTRAVENOUS | Status: AC
  Administered 2017-09-09: 2 g via INTRAVENOUS
  Filled 2017-09-09: qty 50

## 2017-09-09 MED ORDER — DARBEPOETIN ALFA 100 MCG/0.5ML IJ SOSY
100.0000 ug | PREFILLED_SYRINGE | INTRAMUSCULAR | Status: DC
  Administered 2017-09-09: 100 ug via SUBCUTANEOUS
  Filled 2017-09-09: qty 0.5

## 2017-09-09 MED ORDER — OXYCODONE-ACETAMINOPHEN 5-325 MG PO TABS
1.0000 | ORAL_TABLET | ORAL | Status: DC | PRN
  Administered 2017-09-09 (×3): 1 via ORAL
  Administered 2017-09-10 (×2): 2 via ORAL
  Administered 2017-09-10: 1 via ORAL
  Administered 2017-09-11 – 2017-09-14 (×12): 2 via ORAL
  Administered 2017-09-15: 1 via ORAL
  Administered 2017-09-15: 2 via ORAL
  Administered 2017-09-16 (×2): 1 via ORAL
  Administered 2017-09-17 – 2017-09-22 (×9): 2 via ORAL
  Filled 2017-09-09 (×2): qty 2
  Filled 2017-09-09: qty 1
  Filled 2017-09-09: qty 2
  Filled 2017-09-09: qty 1
  Filled 2017-09-09 (×5): qty 2
  Filled 2017-09-09: qty 1
  Filled 2017-09-09 (×6): qty 2
  Filled 2017-09-09: qty 1
  Filled 2017-09-09 (×7): qty 2
  Filled 2017-09-09: qty 1
  Filled 2017-09-09 (×2): qty 2
  Filled 2017-09-09: qty 1
  Filled 2017-09-09 (×2): qty 2

## 2017-09-09 MED ORDER — AMLODIPINE BESYLATE 5 MG PO TABS
5.0000 mg | ORAL_TABLET | Freq: Every day | ORAL | Status: DC
  Administered 2017-09-09 – 2017-09-15 (×7): 5 mg via ORAL
  Filled 2017-09-09 (×7): qty 1

## 2017-09-09 MED ORDER — SODIUM CHLORIDE 0.9% FLUSH: 10.0000 mL | INTRAVENOUS | Status: DC | PRN

## 2017-09-09 MED ORDER — SODIUM CHLORIDE 0.9% IV SOLUTION
Freq: Once | INTRAVENOUS | Status: AC
  Administered 2017-09-09: 06:00:00 via INTRAVENOUS

## 2017-09-09 MED ORDER — SODIUM CHLORIDE 0.9% FLUSH
10.0000 mL | Freq: Two times a day (BID) | INTRAVENOUS | Status: DC
  Administered 2017-09-09: 20 mL
  Administered 2017-09-09 – 2017-09-22 (×21): 10 mL

## 2017-09-09 MED ORDER — FAMOTIDINE 20 MG PO TABS
20.0000 mg | ORAL_TABLET | Freq: Every day | ORAL | Status: DC
  Administered 2017-09-09 – 2017-09-22 (×14): 20 mg via ORAL
  Filled 2017-09-09 (×14): qty 1

## 2017-09-09 MED ORDER — CHLORHEXIDINE GLUCONATE CLOTH 2 % EX PADS
6.0000 | MEDICATED_PAD | Freq: Every day | CUTANEOUS | Status: DC
  Administered 2017-09-09 – 2017-09-10 (×2): 6 via TOPICAL

## 2017-09-09 NOTE — Progress Notes (Signed)
Paged Dr. Prescott Gum about patient's rhythm. EKG showed sinus bradycardia with premature supraventricular complexes. MAPs dropping to low 60s with each missed beat. Orders for 5% albumin x 1 and 2g magnesium IVPB.

## 2017-09-09 NOTE — Progress Notes (Signed)
PULMONARY / CRITICAL CARE MEDICINE   Name: Diana Walters MRN: [id number] DOB: 1955/12/01    ADMISSION DATE:  09/07/2017 CONSULTATION DATE:  09/08/2017  REFERRING MD:  Dr. Donzetta Matters  CHIEF COMPLAINT:  Weakness/ back pain  HISTORY OF PRESENT ILLNESS:   62 y/o F with hx of complex hybrid thoracic aneurysm repair at [facility] (03/31/12), asthma, anemia, RA (reportedly on methotrexate but not on med list), smoker, marijuana abuse, HTN, CVA and seizures admitted early 7/25 am with generalized weakness since 05/2017 and mid-scapular back pain for three weeks.  Work up notable for CT which showed an acute endovascular leak at the level of the aortic arch with acute extravasation of contrast into the native aneurysmal sac and extensive hematoma in the mediastinum around the arch and descending aorta.  Hgb down to 5.1 from baseline of 9-10. She was transfused 2 units PRBC's.  VVS consulted (Dr. Donzetta Matters) and recommended stabilization prior to surgery. CVTS involved in her care as well.  After discussion, it was planned for femoral angiogram to evaluate for site of leak.     SUBJECTIVE:  RN reports no acute events.  Pt reports RA pain.  Afebrile.  VSS. Received 2 units PRBC's for Hgb 6.7.  VITAL SIGNS: BP (!) 162/71   Pulse 60   Temp 98.2 F (36.8 C) (Oral)   Resp 11   Ht 5\' 10"  (1.778 m)   Wt 146 lb 2.6 oz (66.3 kg)   SpO2 97%   BMI 20.97 kg/m   HEMODYNAMICS: PAP: (31-53)/(7-29) 39/14 CVP:  [0 mmHg-16 mmHg] 5 mmHg   VENTILATOR SETTINGS:     INTAKE / OUTPUT: I/O last 3 completed shifts: In: 6405.7 [I.V.:5672.6; IV Piggyback:733.1] Out: 960 [Urine:810; Blood:150]  PHYSICAL EXAMINATION: General: adult female in NAD lying in bed HEENT: MM pink/moist, no jvd, Swan in L IJ  Neuro: AAOx4, speech clear, MAE  CV: s1s2 rrr, no m/r/g PULM: even/non-labored, lungs bilaterally clear anterior, diminished bases with few crackles GU:RKYH, non-tender, bsx4 active  Extremities: warm/dry, trace generalized edema   Skin: no rashes or lesions  LABS:  BMET Recent Labs  Lab 09/07/17 2009 09/08/17 0609 09/08/17 1453 09/09/17 0332  NA 140 141 142 141  K 4.5 4.8 4.4 4.7  CL 108 111 113* 112*  CO2 20* 16*  --  20*  BUN 54* 50* 41* 45*  CREATININE 3.33* 3.02* 2.90* 3.07*  GLUCOSE 87 68* 82 71    Electrolytes Recent Labs  Lab 09/07/17 2009 09/08/17 0609 09/09/17 0332  CALCIUM 8.6* 8.1* 8.0*  MG  --  2.1  --   PHOS  --  5.1*  --     CBC Recent Labs  Lab 09/07/17 2009 09/08/17 0609 09/08/17 1207 09/08/17 1453 09/09/17 0332  WBC 10.1 9.9  --   --  10.6*  HGB 5.1* 8.0* 8.1* 8.5* 6.7*  HCT 17.5* 25.6* 25.0* 25.0* 21.5*  PLT 539* 385  --   --  275    Coag's Recent Labs  Lab 09/07/17 2009  APTT 27  INR 1.09    Sepsis Markers No results for input(s): LATICACIDVEN, PROCALCITON, O2SATVEN in the last 168 hours.  ABG Recent Labs  Lab 09/08/17 1456 09/08/17 1952 09/09/17 0334  PHART 7.274* 7.269* 7.345*  PCO2ART 42.0 39.6 33.8  PO2ART 417.0* 81.0* 72.0*    Liver Enzymes Recent Labs  Lab 09/07/17 2009 09/09/17 0332  AST 19 14*  ALT 12 10  ALKPHOS 57 46  BILITOT 0.5 0.5  ALBUMIN 2.7* 2.2*  Cardiac Enzymes Recent Labs  Lab 09/08/17 0133  TROPONINI <0.03    Glucose Recent Labs  Lab 09/08/17 0608 09/08/17 0737 09/08/17 1154 09/08/17 2335 09/09/17 0610  GLUCAP 74 80 75 87 78    STUDIES:  7/25 CTA chest >>  Aortic endograft extending from the ascending to the lower descending thoracic aorta. There is evidence of acute endoleak at the level of the aortic arch with active extravasation of contrast material into the native aneurysm sac causing enlargement of the native sac. There is also extensive hematoma in the mediastinum around the arch and descending aorta. Small left pleural effusion, possibly hemorrhagic, with atelectasis in the left base. No evidence of aneurysm or dissection of the abdominal aorta.  CULTURES:   ANTIBIOTICS:   SIGNIFICANT  EVENTS: 7/25 Admit, to OR for repair of endovascular leak  LINES/TUBES: PIV, 20g x 2 Donell Sievert 7/25 >>   DISCUSSION: 62 year old presenting with symptomatic anemia, 5.1 on admit with concern for acute endovascular leak after prior complex thoracic aneurysm repair at [facility] in 2014.  Transfused 2 units PRBC on admit. No obvious other source of bleeding, hemoccult negative. Hemodynamically stable.  To OR PM of 7/25 for endovascular repair of aortic arch.  Post-operative course complicated by anemia, AKI.      ASSESSMENT / PLAN:  PULMONARY A: Left pleural effusion- small, concern for hemorrhagic etiology vs transudate  Tobacco and marijuana abuse  P:   Pulmonary hygiene- IS, mobilize  Smoking cessation counseling  Intermittent CXR if new symptoms   CARDIOVASCULAR A:  Endovascular Leak  Thoracic aneurysm s/p hybrid repair at [facility] 2014, tx to vascular care here 2016 w/ Dr. Trula Slade Hx HTN, HLD, murmur, HF - repeat TTE 09/06/17 with improvement in EF from 35-40 to 50-55%, G2DD, calcified aortic and mitral valves without stenosis; prior hx of small PFO found on TEE P:  Tele monitoring  SBP goal < 160, HR <100  VVS, CVTS following  Hgb goal >7 Resume norvasc, metoprolol   RENAL A:   CKD- appears stable, at risk for AKI s/p IV contrast P:   KVO IVF  Trend BMP / urinary output Replace electrolytes as indicated Avoid nephrotoxic agents, ensure adequate renal perfusion  GASTROINTESTINAL A:   No acute issues P:   Pepcid Diet as tolerated   HEMATOLOGIC A:   Symptomatic Anemia - 10.3 in May, now 5.1 - s/p 2 units PRBC P:  Trend CBC  Transfuse for Hgb <7 SCD's  INFECTIOUS A:   No acute concerns - on methotrexate P:   Monitor  PRN percocet Hold methotrexate  ENDOCRINE A:   No issues  P:   SSI   NEUROLOGIC A:   Hx of CVA without residual deficits per patient, arthritis osteo/ RA P:   Supportive care Hold methotrexate   FAMILY  - Updates: Pt updated at  bedside on plan of care   - Inter-disciplinary family meet or Palliative Care meeting due by:  8/1  - Global:  To TRH as of 7/27 am.     Noe Gens, NP-C [facility] Pgr: [phone] or if no answer [phone] 09/09/2017, 12:43 PM

## 2017-09-09 NOTE — Progress Notes (Signed)
Patient ID: Diana Walters, female   DOB: 1955-09-10, 62 y.o.   MRN: [id number] TCTS DAILY ICU PROGRESS NOTE                   [address]            [place] [address]          [phone]   1 Day Post-Op Procedure(s) (LRB): THORACIC AORTIC ENDOVASCULAR STENT GRAFT (N/A)  Total Length of Stay:  LOS: 1 day   Subjective: Feels well this am, some back pain but improved   Objective: Vital signs in last 24 hours: Temp:  [96.7 F (35.9 C)-98.3 F (36.8 C)] 98.2 F (36.8 C) (07/26 0800) Pulse Rate:  [66-76] 72 (07/26 0745) Cardiac Rhythm: Normal sinus rhythm;Other (Comment) (07/26 0400) Resp:  [9-27] 26 (07/26 0745) BP: (146-182)/(59-80) 161/69 (07/26 0745) SpO2:  [96 %-100 %] 99 % (07/26 0745) Arterial Line BP: (107-113)/(60-64) 113/64 (07/25 1730) Weight:  [146 lb 2.6 oz (66.3 kg)] 146 lb 2.6 oz (66.3 kg) (07/26 0600)  Filed Weights   09/07/17 1919 09/07/17 1926 09/09/17 0600  Weight: 130 lb (59 kg) 133 lb (60.3 kg) 146 lb 2.6 oz (66.3 kg)    Weight change: 16 lb 2.6 oz (7.332 kg)   Hemodynamic parameters for last 24 hours: PAP: (31-43)/(7-21) 41/16 CVP:  [0 mmHg-9 mmHg] 5 mmHg  Intake/Output from previous day: 07/25 0701 - 07/26 0700 In: 3290.3 [I.V.:2557.2; IV Piggyback:733.1] Out: 960 [Urine:810; Blood:150]  Intake/Output this shift: No intake/output data recorded.  Current Meds: Scheduled Meds: . Chlorhexidine Gluconate Cloth  6 each Topical Daily  . docusate sodium  100 mg Oral Daily  . heparin  5,000 Units Subcutaneous Q8H  . insulin aspart  1-3 Units Subcutaneous Q6H  . pantoprazole  40 mg Oral Daily  . sodium chloride flush  10-40 mL Intracatheter Q12H   Continuous Infusions: . sodium chloride    . sodium chloride 10 mL/hr (09/09/17 0700)  .  ceFAZolin (ANCEF) IV Stopped (09/09/17 0356)  . famotidine (PEPCID) IV Stopped (09/08/17 3716)  . magnesium sulfate 1 - 4 g bolus IVPB     PRN Meds:.sodium chloride, hydrALAZINE, HYDROmorphone  (DILAUDID) injection, labetalol, magnesium sulfate 1 - 4 g bolus IVPB, ondansetron (ZOFRAN) IV, potassium chloride, sodium chloride flush  General appearance: alert, cooperative and no distress Neurologic: intact Heart: regular rate and rhythm, S1, S2 normal, no murmur, click, rub or gallop Lungs: diminished breath sounds bibasilar Abdomen: soft, non-tender; bowel sounds normal; no masses,  no organomegaly Extremities: intact neuro function in lower extremities  Wound: right groin intact   Lab Results: CBC: Recent Labs    09/08/17 0609  09/08/17 1453 09/09/17 0332  WBC 9.9  --   --  10.6*  HGB 8.0*   < > 8.5* 6.7*  HCT 25.6*   < > 25.0* 21.5*  PLT 385  --   --  275   < > = values in this interval not displayed.   BMET:  Recent Labs    09/08/17 0609 09/08/17 1453 09/09/17 0332  NA 141 142 141  K 4.8 4.4 4.7  CL 111 113* 112*  CO2 16*  --  20*  GLUCOSE 68* 82 71  BUN 50* 41* 45*  CREATININE 3.02* 2.90* 3.07*  CALCIUM 8.1*  --  8.0*    CMET: Lab Results  Component Value Date   WBC 10.6 (H) 09/09/2017   HGB 6.7 (LL) 09/09/2017   HCT 21.5 (  L) 09/09/2017   PLT 275 09/09/2017   GLUCOSE 71 09/09/2017   CHOL 162 05/18/2016   TRIG 97 05/18/2016   HDL 48 05/18/2016   LDLDIRECT 74.3 11/27/2010   LDLCALC 95 05/18/2016   ALT 10 09/09/2017   AST 14 (L) 09/09/2017   NA 141 09/09/2017   K 4.7 09/09/2017   CL 112 (H) 09/09/2017   CREATININE 3.07 (H) 09/09/2017   BUN 45 (H) 09/09/2017   CO2 20 (L) 09/09/2017   TSH 0.587 06/07/2017   INR 1.09 09/07/2017   HGBA1C 5.8 (H) 11/30/2012      PT/INR:  Recent Labs    09/07/17 2009  LABPROT 14.0  INR 1.09   Radiology: Dg Chest Port 1 View  Result Date: 09/08/2017 CLINICAL DATA:  Catheter placement EXAM: PORTABLE CHEST 1 VIEW COMPARISON:  09/08/2017, CT chest 09/08/2017, radiograph 09/07/2017, 06/06/2017, 06/09/2017 FINDINGS: Post sternotomy changes and surgical clips in the right axilla. Aortic stent graft with interval  grafting at the ascending aorta and arch. Right-sided central venous Swan-Ganz catheter tip projects over the right pulmonary artery. The right lung is clear. There is scarring at the left base. Stable mild cardiomegaly. Stable enlarged mediastinal silhouette with soft tissue silhouetting the aortic stent graft, corresponding to the mediastinal hematoma and changes noted on the recent chest CT. There is no pneumothorax. IMPRESSION: 1. Interim insertion of right IJ Swan-Ganz catheter with tip overlying the right pulmonary artery. 2. Interval grafting of the ascending aorta arch and knob since prior radiograph. 3. Stable widened mediastinum with soft tissue silhouetting the aortic arch, corresponding to mediastinal hematoma and changes noted on recent CT. Electronically Signed   By: Donavan Foil M.D.   On: 09/08/2017 19:09   Dg Chest Port 1 View  Result Date: 09/08/2017 CLINICAL DATA:  Hypertension.  Asthma. EXAM: PORTABLE CHEST 1 VIEW COMPARISON:  Chest CT 09/08/2017.  Chest x-ray 09/07/2017. FINDINGS: Prior CABG. Stable cardiomegaly. Thoracic aortic aneurysm with stent graft again noted. This appears stable. Lungs are clear of acute infiltrates. Left-sided pleural thickening versus pleural effusion. No pneumothorax. Surgical clips right chest. IMPRESSION: 1. Prior CABG. Stable cardiomegaly. Thoracic aortic aneurysm with stent graft again noted. Mild left-sided pleural thickening versus pleural scarring. 2.  No acute pulmonary disease. Electronically Signed   By: Marcello Moores  Register   On: 09/08/2017 12:32   Chronic Kidney Disease   Stage I     GFR >90  Stage II    GFR 60-89  Stage IIIA GFR 45-59  Stage IIIB GFR 30-44  Stage IV   GFR 15-29  Stage V    GFR  <15  Lab Results  Component Value Date   CREATININE 3.07 (H) 09/09/2017   Estimated Creatinine Clearance: 19.6 mL/min (A) (by C-G formula based on SCr of 3.07 mg/dL (H)).  Assessment/Plan: S/P Procedure(s) (LRB): THORACIC AORTIC ENDOVASCULAR  STENT GRAFT (N/A) Mobilize Diuresis Diabetes control Expected Acute  Blood - loss Anemia- continue to monitor - getting two units prbc Cr stable at base line Stable stage iv ckd   Grace Isaac 09/09/2017 8:15 AM

## 2017-09-09 NOTE — Progress Notes (Addendum)
[facility] ROUNDING NOTE   Subjective:   Interval History: has complaints of persistent RA pain in her joints as well as the ack pain which brought her to the ED. She denied fever, chills nausea of vomiting.  Objective:  Vital signs in last 24 hours:  Temp:  [96.7 F (35.9 C)-98.3 F (36.8 C)] 98.2 F (36.8 C) (07/26 1143) Pulse Rate:  [60-85] 60 (07/26 1100) Resp:  [9-27] 11 (07/26 1130) BP: (146-182)/(59-80) 152/73 (07/26 1130) SpO2:  [96 %-100 %] 96 % (07/26 1130) Arterial Line BP: (107-113)/(60-64) 113/64 (07/25 1730) Weight:  [146 lb 2.6 oz (66.3 kg)] 146 lb 2.6 oz (66.3 kg) (07/26 0600)  Weight change: 16 lb 2.6 oz (7.332 kg) Filed Weights   09/07/17 1919 09/07/17 1926 09/09/17 0600  Weight: 130 lb (59 kg) 133 lb (60.3 kg) 146 lb 2.6 oz (66.3 kg)   Intake/Output: I/O last 3 completed shifts: In: 6405.7 [I.V.:5672.6; IV Piggyback:733.1] Out: 960 [Urine:810; Blood:150]   Intake/Output this shift:  Total I/O In: 708.8 [P.O.:240; I.V.:38.8; Blood:430] Out: 235 [Urine:235]  Physical Exam: General: A/O x4, in no acute distress, afebrile, nondiaphoretic CVS- RRR, G4 Sys murmur RS- CTA bilaterally  ABD- BS present soft non-distended EXT- no edema bilaterally   Basic Metabolic Panel: Recent Labs  Lab 09/07/17 2009 09/08/17 0609 09/08/17 1453 09/09/17 0332  NA 140 141 142 141  K 4.5 4.8 4.4 4.7  CL 108 111 113* 112*  CO2 20* 16*  --  20*  GLUCOSE 87 68* 82 71  BUN 54* 50* 41* 45*  CREATININE 3.33* 3.02* 2.90* 3.07*  CALCIUM 8.6* 8.1*  --  8.0*  MG  --  2.1  --   --   PHOS  --  5.1*  --   --     Liver Function Tests: Recent Labs  Lab 09/07/17 2009 09/09/17 0332  AST 19 14*  ALT 12 10  ALKPHOS 57 46  BILITOT 0.5 0.5  PROT 7.9 6.0*  ALBUMIN 2.7* 2.2*   No results for input(s): LIPASE, AMYLASE in the last 168 hours. No results for input(s): AMMONIA in the last 168 hours.  CBC: Recent Labs  Lab 09/07/17 2009 09/08/17 0609  09/08/17 1207 09/08/17 1453 09/09/17 0332  WBC 10.1 9.9  --   --  10.6*  NEUTROABS 7.8*  --   --   --   --   HGB 5.1* 8.0* 8.1* 8.5* 6.7*  HCT 17.5* 25.6* 25.0* 25.0* 21.5*  MCV 88.4 87.1  --   --  86.7  PLT 539* 385  --   --  275    Cardiac Enzymes: Recent Labs  Lab 09/08/17 0133  TROPONINI <0.03    BNP: Invalid input(s): POCBNP  CBG: Recent Labs  Lab 09/08/17 0608 09/08/17 0737 09/08/17 1154 09/08/17 2335 09/09/17 0610  GLUCAP 74 80 75 87 72    Microbiology: Results for orders placed or performed during the hospital encounter of 09/07/17  Surgical pcr screen     Status: None   Collection Time: 09/08/17  4:59 AM  Result Value Ref Range Status   MRSA, PCR NEGATIVE NEGATIVE Final   Staphylococcus aureus NEGATIVE NEGATIVE Final    Comment: (NOTE) The Xpert SA Assay (FDA approved for NASAL specimens in patients 55 years of age and older), is one component of a comprehensive surveillance program. It is not intended to diagnose infection nor to guide or monitor treatment. Performed at [facility], [place] [address], [place], [place] [address]  Coagulation Studies: Recent Labs    09/07/17 2007/05/16  LABPROT 14.0  INR 1.09    Urinalysis: Recent Labs    09/07/17 2340  COLORURINE YELLOW  LABSPEC 1.014  PHURINE 6.0  GLUCOSEU NEGATIVE  HGBUR NEGATIVE  BILIRUBINUR NEGATIVE  KETONESUR NEGATIVE  PROTEINUR 100*  NITRITE NEGATIVE  LEUKOCYTESUR NEGATIVE      Imaging: Dg Chest 2 View  Result Date: 09/07/2017 CLINICAL DATA:  Initial evaluation for acute shortness of breath. EXAM: CHEST - 2 VIEW COMPARISON:  Prior radiograph from earlier the same day. FINDINGS: Cardiomegaly again noted, stable. Median sternotomy wires underlying loop recorder. Aortic endograft again seen, stable in position and appearance. Increased soft tissue density again noted along the arch portion of the graft, similar to previous radiograph from earlier today, but new from prior  radiograph from 06/09/2017. Lungs well inflated. Perihilar vascular congestion with interstitial prominence without frank pulmonary edema, somewhat increased from previous. No focal infiltrates. No pneumothorax. No acute osseous abnormality. IMPRESSION: 1. Increased soft tissue density along the superior margin/arch portion of the aortic stent endograft, similar relative to previous radiograph from earlier today, but new relative to 06/09/17. Again, further evaluation with CTA recommended. 2. Stable cardiomegaly with diffuse pulmonary interstitial congestion, increased from radiograph earlier today. Electronically Signed   By: Jeannine Boga M.D.   On: 09/07/2017 22:36   Dg Chest Port 1 View  Result Date: 09/08/2017 CLINICAL DATA:  Catheter placement EXAM: PORTABLE CHEST 1 VIEW COMPARISON:  09/08/2017, CT chest 09/08/2017, radiograph 09/07/2017, 06/06/2017, 06/09/2017 FINDINGS: Post sternotomy changes and surgical clips in the right axilla. Aortic stent graft with interval grafting at the ascending aorta and arch. Right-sided central venous Swan-Ganz catheter tip projects over the right pulmonary artery. The right lung is clear. There is scarring at the left base. Stable mild cardiomegaly. Stable enlarged mediastinal silhouette with soft tissue silhouetting the aortic stent graft, corresponding to the mediastinal hematoma and changes noted on the recent chest CT. There is no pneumothorax. IMPRESSION: 1. Interim insertion of right IJ Swan-Ganz catheter with tip overlying the right pulmonary artery. 2. Interval grafting of the ascending aorta arch and knob since prior radiograph. 3. Stable widened mediastinum with soft tissue silhouetting the aortic arch, corresponding to mediastinal hematoma and changes noted on recent CT. Electronically Signed   By: Donavan Foil M.D.   On: 09/08/2017 19:09   Dg Chest Port 1 View  Result Date: 09/08/2017 CLINICAL DATA:  Hypertension.  Asthma. EXAM: PORTABLE CHEST 1 VIEW  COMPARISON:  Chest CT 09/08/2017.  Chest x-ray 09/07/2017. FINDINGS: Prior CABG. Stable cardiomegaly. Thoracic aortic aneurysm with stent graft again noted. This appears stable. Lungs are clear of acute infiltrates. Left-sided pleural thickening versus pleural effusion. No pneumothorax. Surgical clips right chest. IMPRESSION: 1. Prior CABG. Stable cardiomegaly. Thoracic aortic aneurysm with stent graft again noted. Mild left-sided pleural thickening versus pleural scarring. 2.  No acute pulmonary disease. Electronically Signed   By: Marcello Moores  Register   On: 09/08/2017 12:32   Ct Angio Chest/abd/pel For Dissection W And/or W/wo  Result Date: 09/08/2017 CLINICAL DATA:  History of thoracic aneurysm repair 10 years ago. Now with upper back pain and decreased hemoglobin. EXAM: CT ANGIOGRAPHY CHEST, ABDOMEN AND PELVIS TECHNIQUE: Multidetector CT imaging through the chest, abdomen and pelvis was performed using the standard protocol during bolus administration of intravenous contrast. Multiplanar reconstructed images and MIPs were obtained and reviewed to evaluate the vascular anatomy. CONTRAST:  61mL ISOVUE-370 IOPAMIDOL (ISOVUE-370) INJECTION 76%. Patient has decreased GFR but contrast  material was administered due to emergent nature of the patient's condition. Patient was made aware risks and situation was discussed with nephrology. COMPARISON:  CT chest 06/05/2017.  CT a chest 01/26/2016 FINDINGS: CTA CHEST FINDINGS Cardiovascular: Noncontrast images of the chest demonstrate an ascending and descending thoracic aortic endograft with mural calcification around the native aneurysm extending from the ascending to the lower descending aorta. There is some increased density within the hematoma in the native sac suggesting acute hemorrhage at the level of the aortic arch. Images obtained during arterial phase after contrast administration demonstrate patency of the endograft. Since the previous study, there is enlargement  of the native sac at the level of the aortic arch with current measurement of 6.2 cm compared with 4.4 cm previously. There is active extravasation of contrast material into the native sac inferiorly and medial to the aortic arch consistent with acute endoleak. There is increase material in the mediastinum surrounding the aortic arch and descending aorta outside of the native aneurysm sac suggesting hematoma. Great vessel origins are patent. There is no dissection. Diffuse cardiac enlargement. No pericardial effusion. Central pulmonary arteries are well opacified without evidence of significant pulmonary embolus. Mediastinum/Nodes: Diffuse mediastinal hematoma surrounding the arch and descending aorta as discussed. No significant lymphadenopathy. Esophagus is decompressed. Lungs/Pleura: Small left pleural effusion, possibly hemorrhagic, with atelectasis in the left lung base. Right lung is clear. No pneumothorax. Airways are patent. Musculoskeletal: Degenerative changes in the spine. No destructive bone lesions. Sternotomy wires. Review of the MIP images confirms the above findings. CTA ABDOMEN AND PELVIS FINDINGS VASCULAR Aorta: Normal caliber abdominal aorta with diffuse calcification. No aneurysm or dissection. Celiac: Patent without evidence of aneurysm, dissection, vasculitis or significant stenosis. SMA: Patent without evidence of aneurysm, dissection, vasculitis or significant stenosis. Renals: Duplicated renal arteries bilaterally. Renal arteries are patent without aneurysm or dissection. IMA: Patent without evidence of aneurysm, dissection, vasculitis or significant stenosis. Inflow: Patent without evidence of aneurysm, dissection, vasculitis or significant stenosis. Veins: No obvious venous abnormality within the limitations of this arterial phase study. Review of the MIP images confirms the above findings. NON-VASCULAR Hepatobiliary: No focal liver abnormality is seen. No gallstones, gallbladder wall  thickening, or biliary dilatation. Pancreas: Unremarkable. No pancreatic ductal dilatation or surrounding inflammatory changes. Spleen: Normal in size without focal abnormality. Adrenals/Urinary Tract: No adrenal gland nodules. Cyst in the left kidney is unchanged. Renal nephrograms are symmetrical. No hydronephrosis or hydroureter. Bladder wall is not thickened. Stomach/Bowel: Stomach, small bowel, and colon are not abnormally distended and are predominantly decompressed. No obvious wall thickening or inflammatory changes although decompression limits examination. Appendix is not definitively identified. Lymphatic: No significant lymphadenopathy. Reproductive: Uterus is diffusely enlarged and nodular with multiple calcifications consistent with uterine fibroids. No abnormal adnexal masses. Other: No free air or free fluid in the abdomen. Abdominal wall musculature appears intact. Musculoskeletal: Degenerative changes in the spine. No destructive bone lesions. Review of the MIP images confirms the above findings. IMPRESSION: 1. Aortic endograft extending from the ascending to the lower descending thoracic aorta. There is evidence of acute endoleak at the level of the aortic arch with active extravasation of contrast material into the native aneurysm sac causing enlargement of the native sac. There is also extensive hematoma in the mediastinum around the arch and descending aorta. 2. Small left pleural effusion, possibly hemorrhagic, with atelectasis in the left base. 3. No evidence of aneurysm or dissection of the abdominal aorta. These results were discussed by telephone prior to the time of  interpretation on 09/08/2017 at 1:12 am with PA. Langston Masker , who verbally acknowledged these results. Electronically Signed   By: Lucienne Capers M.D.   On: 09/08/2017 01:15     Medications:   . sodium chloride    . sodium chloride 10 mL/hr (09/09/17 1100)  . magnesium sulfate 1 - 4 g bolus IVPB     .  Chlorhexidine Gluconate Cloth  6 each Topical Daily  . darbepoetin (ARANESP) injection - NON-DIALYSIS  100 mcg Subcutaneous Weekly  . docusate sodium  100 mg Oral Daily  . famotidine  20 mg Oral Daily  . heparin  5,000 Units Subcutaneous Q8H  . insulin aspart  1-3 Units Subcutaneous Q6H  . pantoprazole  40 mg Oral Daily  . 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 She was noted to have an aortic leak and prepared for stenting vs surgical intervention depending on the severity of the leak as per vascular/CT surgery's notes.   Assessment/ Plan:  1. Renal - CKD stage V - Scr stable today after her arteriogram. Up to 3.07 from 2.90. No plans to proceed with AVF placement at this time. She will need to recover prior to proceeding with this unless an emergent dialysis need arises. BMP stable today. Will likely sign off in am if Cr stable and follow as an outpatient for her renal failure.  BMP daily.  2. Hypertension/volume  - HTN persistent since admission. Would recommend resuming home antihypertensives once hemodynamically stable. Receiving multiple units of PRBC's for anemia of 6.7 this am. 3. Thoracic aortic aneurysm and perforation - As per vascular surgery, see above. Will follow for renal needs following procedure.  4.  Anemia, normocytic, acute on chronic - Acute loss most likely secondary to internal hemorrhage and her procedure. Receiving 2 units of PRBC's this am.  Will follow CBC after.  -Will start Darbepoetin today at 161mcg weekly Renal function continues to be stable following stent placement, there appears to be no indications for dialysis at this point. She is being optimized as far and blood pressure and volume are concerned and resuming her home dose of antihypertensives  She is anemic most likely from blood loss and is receiving blood transfusions. She otherwise appears to be stable. I will follow her another day to make sure that there are no additional needs and will see in the morning  LOS: 1 Kathi Ludwig, MD Internal Medicine PGY-2

## 2017-09-09 NOTE — Progress Notes (Signed)
CRITICAL VALUE ALERT  Critical Value:  hemoglobin 6.7  Date & Time Notied:  09/09/2017 @ 9198  Provider Notified: Prescott Gum, MD  Orders Received/Actions taken: 2 units pRBCs

## 2017-09-09 NOTE — Progress Notes (Signed)
  Progress Note    09/09/2017 9:21 AM 1 Day Post-Op  Subjective: Back pain is improved Right groin is sore Leg pain is stable from arthritis  Vitals:   09/09/17 0800 09/09/17 0900  BP: (!) 163/75 (!) 152/61  Pulse: 79   Resp: 15   Temp: 98.2 F (36.8 C) 98.2 F (36.8 C)  SpO2: 99%     Physical Exam: Awake alert oriented Nonlabored respirations Abdomen is soft and nontender Right groin without hematoma Palpable dorsalis pedis pulses bilaterally  CBC    Component Value Date/Time   WBC 10.6 (H) 09/09/2017 0332   RBC 2.48 (L) 09/09/2017 0332   HGB 6.7 (LL) 09/09/2017 0332   HGB 10.3 (L) 06/22/2017 1106   HCT 21.5 (L) 09/09/2017 0332   HCT 31.8 (L) 06/22/2017 1106   PLT 275 09/09/2017 0332   PLT 304 06/22/2017 1106   MCV 86.7 09/09/2017 0332   MCV 77 (L) 06/22/2017 1106   MCH 27.0 09/09/2017 0332   MCHC 31.2 09/09/2017 0332   RDW 18.6 (H) 09/09/2017 0332   RDW 21.7 (H) 06/22/2017 1106   LYMPHSABS 1.4 09/07/2017 2009   LYMPHSABS 1.9 01/19/2017 1128   MONOABS 0.8 09/07/2017 2009   EOSABS 0.1 09/07/2017 2009   EOSABS 0.3 01/19/2017 1128   BASOSABS 0.0 09/07/2017 2009   BASOSABS 0.0 01/19/2017 1128    BMET    Component Value Date/Time   NA 141 09/09/2017 0332   NA 141 06/22/2017 1106   K 4.7 09/09/2017 0332   CL 112 (H) 09/09/2017 0332   CO2 20 (L) 09/09/2017 0332   GLUCOSE 71 09/09/2017 0332   BUN 45 (H) 09/09/2017 0332   BUN 31 (H) 06/22/2017 1106   CREATININE 3.07 (H) 09/09/2017 0332   CREATININE 1.10 06/10/2014 0813   CALCIUM 8.0 (L) 09/09/2017 0332   CALCIUM 7.6 (L) 06/09/2017 0230   GFRNONAA 15 (L) 09/09/2017 0332   GFRAA 18 (L) 09/09/2017 0332    INR    Component Value Date/Time   INR 1.09 09/07/2017 2009     Intake/Output Summary (Last 24 hours) at 09/09/2017 0921 Last data filed at 09/09/2017 0900 Gross per 24 hour  Intake 3487.25 ml  Output 910 ml  Net 2577.25 ml     Assessment:  62 y.o. female is s/p thoracic endovascular  stenting for what appeared to be acute endoleak from a fractured previous stent.  Her pain is now improved.  Plan: -Out of bed as tolerates -Diet as tolerated -Monitor renal function -She will need follow-up in 4 to 6 weeks and will need a CT with contrast despite her elevated creatinine.  We have discussed that all the studies may land her on dialysis and she understands this.  Her creatinine remains at baseline although she has had a CT angios with 80 cc of contrast now 2 days ago and yesterday she was given an additional 45 cc of contrast during our operation.  I would expect that if she has a contrast nephropathy from this it would be in the next day.   Nakira Litzau C. Donzetta Matters, MD [facility] Office: [phone] Pager: [phone]  09/09/2017 9:21 AM

## 2017-09-10 ENCOUNTER — Inpatient Hospital Stay (HOSPITAL_COMMUNITY): Payer: Medicare Other

## 2017-09-10 DIAGNOSIS — I729 Aneurysm of unspecified site: Secondary | ICD-10-CM

## 2017-09-10 DIAGNOSIS — R52 Pain, unspecified: Secondary | ICD-10-CM

## 2017-09-10 LAB — CBC
HCT: 27.6 % — ABNORMAL LOW (ref 36.0–46.0)
HEMOGLOBIN: 8.8 g/dL — AB (ref 12.0–15.0)
MCH: 28 pg (ref 26.0–34.0)
MCHC: 31.9 g/dL (ref 30.0–36.0)
MCV: 87.9 fL (ref 78.0–100.0)
PLATELETS: 215 10*3/uL (ref 150–400)
RBC: 3.14 MIL/uL — ABNORMAL LOW (ref 3.87–5.11)
RDW: 17.9 % — AB (ref 11.5–15.5)
WBC: 9.9 10*3/uL (ref 4.0–10.5)

## 2017-09-10 LAB — BASIC METABOLIC PANEL
ANION GAP: 12 (ref 5–15)
BUN: 42 mg/dL — ABNORMAL HIGH (ref 8–23)
CALCIUM: 7.9 mg/dL — AB (ref 8.9–10.3)
CO2: 20 mmol/L — ABNORMAL LOW (ref 22–32)
CREATININE: 3.28 mg/dL — AB (ref 0.44–1.00)
Chloride: 107 mmol/L (ref 98–111)
GFR calc Af Amer: 16 mL/min — ABNORMAL LOW (ref >60)
GFR, EST NON AFRICAN AMERICAN: 14 mL/min — AB (ref >60)
GLUCOSE: 82 mg/dL (ref 70–99)
Potassium: 4.6 mmol/L (ref 3.5–5.1)
Sodium: 139 mmol/L (ref 135–145)

## 2017-09-10 LAB — HEMOGLOBIN AND HEMATOCRIT, BLOOD
HCT: 31 % — ABNORMAL LOW (ref 36.0–46.0)
Hemoglobin: 9.9 g/dL — ABNORMAL LOW (ref 12.0–15.0)

## 2017-09-10 LAB — GLUCOSE, CAPILLARY
GLUCOSE-CAPILLARY: 87 mg/dL (ref 70–99)
GLUCOSE-CAPILLARY: 138 mg/dL — AB (ref 70–99)
Glucose-Capillary: 114 mg/dL — ABNORMAL HIGH (ref 70–99)

## 2017-09-10 MED ORDER — METHYLPREDNISOLONE SODIUM SUCC 125 MG IJ SOLR
60.0000 mg | INTRAMUSCULAR | Status: DC
Start: 2017-09-10 — End: 2017-09-13
  Administered 2017-09-10 – 2017-09-12 (×3): 60 mg via INTRAVENOUS
  Filled 2017-09-10 (×3): qty 2

## 2017-09-10 NOTE — Progress Notes (Addendum)
[facility] ROUNDING NOTE   Subjective:   Interval History: Sleeping when seen but arouses easily to voice. Doing well and denies any complaints other than R knee pain which chronic from RA.   Objective:  Vital signs in last 24 hours:  Temp:  [98.2 F (36.8 C)-99 F (37.2 C)] 98.3 F (36.8 C) (07/27 0700) Pulse Rate:  [27-86] 41 (07/27 0700) Resp:  [10-24] 11 (07/27 0800) BP: (126-178)/(57-96) 152/70 (07/27 0800) SpO2:  [95 %-100 %] 97 % (07/27 0700) Weight:  [151 lb 0.2 oz (68.5 kg)] 151 lb 0.2 oz (68.5 kg) (07/27 0600)  Weight change: 4 lb 13.6 oz (2.2 kg) Filed Weights   09/07/17 1926 09/09/17 0600 09/10/17 0600  Weight: 133 lb (60.3 kg) 146 lb 2.6 oz (66.3 kg) 151 lb 0.2 oz (68.5 kg)   Intake/Output: I/O last 3 completed shifts: In: 2141.5 [P.O.:580; I.V.:201.3; Blood:760; IV Piggyback:600.3] Out: 1280 [Urine:1280]   Intake/Output this shift:  No intake/output data recorded.  Physical Exam: General: A/O x4, in no acute distress, afebrile, nondiaphoretic CVS- RRR, G4 Sys murmur RS- CTA bilaterally  ABD- BS present soft non-distended EXT- R knee swollen, otherwise no LE edema    Basic Metabolic Panel: Recent Labs  Lab 09/07/17 2009 09/08/17 0609 09/08/17 1453 09/09/17 0332 09/10/17 0440  NA 140 141 142 141 139  K 4.5 4.8 4.4 4.7 4.6  CL 108 111 113* 112* 107  CO2 20* 16*  --  20* 20*  GLUCOSE 87 68* 82 71 82  BUN 54* 50* 41* 45* 42*  CREATININE 3.33* 3.02* 2.90* 3.07* 3.28*  CALCIUM 8.6* 8.1*  --  8.0* 7.9*  MG  --  2.1  --   --   --   PHOS  --  5.1*  --   --   --     Liver Function Tests: Recent Labs  Lab 09/07/17 2009 09/09/17 0332  AST 19 14*  ALT 12 10  ALKPHOS 57 46  BILITOT 0.5 0.5  PROT 7.9 6.0*  ALBUMIN 2.7* 2.2*   No results for input(s): LIPASE, AMYLASE in the last 168 hours. No results for input(s): AMMONIA in the last 168 hours.  CBC: Recent Labs  Lab 09/07/17 2009 09/08/17 0609 09/08/17 1207 09/08/17 1453  09/09/17 0332 09/09/17 1725 09/10/17 0440  WBC 10.1 9.9  --   --  10.6*  --  9.9  NEUTROABS 7.8*  --   --   --   --   --   --   HGB 5.1* 8.0* 8.1* 8.5* 6.7* 9.9* 8.8*  HCT 17.5* 25.6* 25.0* 25.0* 21.5* 30.9* 27.6*  MCV 88.4 87.1  --   --  86.7  --  87.9  PLT 539* 385  --   --  275  --  215    Cardiac Enzymes: Recent Labs  Lab 09/08/17 0133  TROPONINI <0.03    BNP: Invalid input(s): POCBNP  CBG: Recent Labs  Lab 09/08/17 2335 09/09/17 0610 09/09/17 1149 09/09/17 2307 09/10/17 0309  GLUCAP 87 78 93 98 87    Microbiology: Results for orders placed or performed during the hospital encounter of 09/07/17  Surgical pcr screen     Status: None   Collection Time: 09/08/17  4:59 AM  Result Value Ref Range Status   MRSA, PCR NEGATIVE NEGATIVE Final   Staphylococcus aureus NEGATIVE NEGATIVE Final    Comment: (NOTE) The Xpert SA Assay (FDA approved for NASAL specimens in patients 1 years of age and older), is  one component of a comprehensive surveillance program. It is not intended to diagnose infection nor to guide or monitor treatment. Performed at [facility], [place] [address], [place], [place] [address]     Coagulation Studies: Recent Labs    09/07/17 05-28-07  LABPROT 14.0  INR 1.09    Urinalysis: Recent Labs    09/07/17 2340  COLORURINE YELLOW  LABSPEC 1.014  PHURINE 6.0  GLUCOSEU NEGATIVE  HGBUR NEGATIVE  BILIRUBINUR NEGATIVE  KETONESUR NEGATIVE  PROTEINUR 100*  NITRITE NEGATIVE  LEUKOCYTESUR NEGATIVE      Imaging: Dg Chest Port 1 View  Result Date: 09/08/2017 CLINICAL DATA:  Catheter placement EXAM: PORTABLE CHEST 1 VIEW COMPARISON:  09/08/2017, CT chest 09/08/2017, radiograph 09/07/2017, 06/06/2017, 06/09/2017 FINDINGS: Post sternotomy changes and surgical clips in the right axilla. Aortic stent graft with interval grafting at the ascending aorta and arch. Right-sided central venous Swan-Ganz catheter tip projects over the right pulmonary  artery. The right lung is clear. There is scarring at the left base. Stable mild cardiomegaly. Stable enlarged mediastinal silhouette with soft tissue silhouetting the aortic stent graft, corresponding to the mediastinal hematoma and changes noted on the recent chest CT. There is no pneumothorax. IMPRESSION: 1. Interim insertion of right IJ Swan-Ganz catheter with tip overlying the right pulmonary artery. 2. Interval grafting of the ascending aorta arch and knob since prior radiograph. 3. Stable widened mediastinum with soft tissue silhouetting the aortic arch, corresponding to mediastinal hematoma and changes noted on recent CT. Electronically Signed   By: Donavan Foil M.D.   On: 09/08/2017 19:09   Dg Chest Port 1 View  Result Date: 09/08/2017 CLINICAL DATA:  Hypertension.  Asthma. EXAM: PORTABLE CHEST 1 VIEW COMPARISON:  Chest CT 09/08/2017.  Chest x-ray 09/07/2017. FINDINGS: Prior CABG. Stable cardiomegaly. Thoracic aortic aneurysm with stent graft again noted. This appears stable. Lungs are clear of acute infiltrates. Left-sided pleural thickening versus pleural effusion. No pneumothorax. Surgical clips right chest. IMPRESSION: 1. Prior CABG. Stable cardiomegaly. Thoracic aortic aneurysm with stent graft again noted. Mild left-sided pleural thickening versus pleural scarring. 2.  No acute pulmonary disease. Electronically Signed   By: Marcello Moores  Register   On: 09/08/2017 12:32     Medications:   . sodium chloride    . sodium chloride Stopped (09/09/17 1800)  . magnesium sulfate 1 - 4 g bolus IVPB     . amLODipine  5 mg Oral Daily  . Chlorhexidine Gluconate Cloth  6 each Topical Daily  . darbepoetin (ARANESP) injection - NON-DIALYSIS  100 mcg Subcutaneous Q Fri-1800  . docusate sodium  100 mg Oral Daily  . famotidine  20 mg Oral Daily  . heparin  5,000 Units Subcutaneous Q8H  . insulin aspart  1-3 Units Subcutaneous Q6H  . metoprolol tartrate  12.5 mg Oral BID  . pantoprazole  40 mg Oral Daily   . 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 She was noted to have an aortic leak and prepared for stenting vs surgical intervention depending on the severity of the leak  as per vascular/CT surgery's notes.   Assessment/ Plan:  1. Renal - CKD stage V - Ms. Epler is doing well this morning. Her creatinine has been trending up for the last 2-3 days. Cr 2.9-> 3.0-> 3.2, but overall her renal function is stable compared to the last 4 months. No plans to proceed with AVF placement at this time. She will need to recover prior to proceeding with this unless an emergent dialysis need arises. Bicarb stable at 20. Continue strict I/Os and daily weights as well monitoring of renal function. Will sign off.  2. Hypertension/volume  - BP improving after resumption of home amlodipine and metoprolol. Appears euvolemic on exam.  3. Thoracic aortic aneurysm and perforation - As per vascular surgery, see above. 4.  Anemia, normocytic, acute on chronic - Acute loss most likely secondary to internal hemorrhage and her procedure. S/p 2 units of blood yesterday. Hgb responded appropriately, 8.8 this AM. Started Darbepoetin today at 157mcg weekly.   Thank you for the consult, we shall be happy to continue to follow with you if you would like  Not really contributing a great deal at this time  Please call [phone] if you would like more help  LOS: 2 Kathi Ludwig, MD Internal Medicine  PGY-2

## 2017-09-10 NOTE — Progress Notes (Signed)
      [address]       [place] [address]             [phone]      Feels better today  BP (!) 152/70   Pulse (!) 41   Temp 98.3 F (36.8 C)   Resp 11   Ht 5\' 10"  (1.778 m)   Wt 151 lb 0.2 oz (68.5 kg)   SpO2 97%   BMI 21.67 kg/m   Intake/Output Summary (Last 24 hours) at 09/10/2017 0900 Last data filed at 09/10/2017 0600 Gross per 24 hour  Intake 874.78 ml  Output 710 ml  Net 164.78 ml   Creatinine up slightly to 3.28- likely due to dye, still has good UO  Plan per Vascular  Remo Lipps C. Roxan Hockey, MD [facility] [phone]

## 2017-09-10 NOTE — Progress Notes (Signed)
   VASCULAR SURGERY ASSESSMENT & PLAN:   2 Days Post-Op s/p: Thoracic endovascular stenting for a type III endoleak.  Her pain has resolved.  RENAL: Creatinine has increased slightly.  She continues to have reasonable urine output.   SUBJECTIVE:   No specific complaints.  PHYSICAL EXAM:   Vitals:   09/10/17 0500 09/10/17 0600 09/10/17 0700 09/10/17 0800  BP: (!) 155/65 (!) 128/57 (!) 126/58 (!) 152/70  Pulse: (!) 49 (!) 51 (!) 41   Resp: 11 11 11 11   Temp:   98.3 F (36.8 C)   TempSrc:      SpO2: 99% 97% 97%   Weight:  151 lb 0.2 oz (68.5 kg)    Height:       Right groin incision looks fine.  LABS:   Lab Results  Component Value Date   WBC 9.9 09/10/2017   HGB 8.8 (L) 09/10/2017   HCT 27.6 (L) 09/10/2017   MCV 87.9 09/10/2017   PLT 215 09/10/2017   Lab Results  Component Value Date   CREATININE 3.28 (H) 09/10/2017   Lab Results  Component Value Date   INR 1.09 09/07/2017   CBG (last 3)  Recent Labs    09/09/17 1149 09/09/17 2307 09/10/17 0309  GLUCAP 93 98 87    PROBLEM LIST:    Active Problems:   Symptomatic anemia   CURRENT MEDS:   . amLODipine  5 mg Oral Daily  . Chlorhexidine Gluconate Cloth  6 each Topical Daily  . darbepoetin (ARANESP) injection - NON-DIALYSIS  100 mcg Subcutaneous Q Fri-1800  . docusate sodium  100 mg Oral Daily  . famotidine  20 mg Oral Daily  . heparin  5,000 Units Subcutaneous Q8H  . insulin aspart  1-3 Units Subcutaneous Q6H  . metoprolol tartrate  12.5 mg Oral BID  . pantoprazole  40 mg Oral Daily  . sodium chloride flush  10-40 mL Intracatheter Q12H    Diana Walters Beeper: [phone] Office: [phone] 09/10/2017

## 2017-09-10 NOTE — Plan of Care (Signed)
Pt is progressing. Continue with care plan.

## 2017-09-10 NOTE — Progress Notes (Signed)
PROGRESS NOTE    Diana Walters  SFK:[id number] DOB: 06-Jan-1956 DOA: 09/07/2017 PCP: Renato Shin, MD    Brief Narrative:   62 y/o F with hx of complex hybrid thoracic aneurysm repair at [facility] (03/31/12), asthma, anemia, RA (reportedly on methotrexate but not on med list), smoker, marijuana abuse, HTN, CVA and seizures admitted early 7/25 am with generalized weakness since 05/2017 and mid-scapular back pain for three weeks. Work up notable for CT which showed an acute endovascular leak at the level of the aortic arch with acute extravasation of contrast into the native aneurysmal sac and extensive hematoma in the mediastinum around the arch and descending aorta. Hgb down to 5.1 from baseline of 9-10. She was transfused 2 units PRBC's. VVS consulted (Dr. Donzetta Matters) and recommended stabilization prior to surgery. CVTS involved in her care as well. She underwent endovascular repair of the aortic arch. Post op, course complicated for  anemia of blood loss and AKI,     Assessment & Plan:   Active Problems:   Symptomatic anemia   Thoracic aneurysm s/p repair at [facility] in 2014, come in for back pain, was found to have mediastinal hematoma , underwent endovascular repair of the aortic arth on 7/25. - vascular and cardiothoracic surgery on board.  - no issues.    Acute Anemia of blood loss/ iron deficiency anemia:  S/p 2 units of prbc transfusion. Repeat H&H is around 8.  Darbepoetin started by nephrology.  Transfuse to keep hemoglobin greater than 7.   Left knee swelling and tenderness: painful ROM of the left knee.  Possibly a RA flare up of the knee.  Will get a knee x ray, evaluate for left knee effusion. Since she is off methotrexate, will start her on IV solumedrol 60 mg daily.    H/oRheumatoid Arthritis: On Methotrexate which is on hold for now.  Pain control.    H/o of CVA : Stable.     Hypertension;  Well controlled.    Acute on stage 4/5 CKD Creatinine 2.9 -3.0-3.2  Renal  on board.     DVT prophylaxis:  Code Status: FULL CODE.  Family Communication: none at bedside.  Disposition Plan: pending clinical improvement.    Consultants:   Cardio thoracic surgery Dr hendrickson   Vascular surgery Dr Scot Dock  Nephrology Dr Justin Mend  PCCM   Procedures: Thoracic endovascular stenting for a type III endoleak on 7/25      Antimicrobials: None.    Subjective: No chest pain or sob. No nausea or vomiting or abdominal pain.   Objective: Vitals:   09/10/17 0500 09/10/17 0600 09/10/17 0700 09/10/17 0800  BP: (!) 155/65 (!) 128/57 (!) 126/58 (!) 152/70  Pulse: (!) 49 (!) 51 (!) 41   Resp: 11 11 11 11   Temp:   98.3 F (36.8 C)   TempSrc:      SpO2: 99% 97% 97%   Weight:  68.5 kg (151 lb 0.2 oz)    Height:        Intake/Output Summary (Last 24 hours) at 09/10/2017 1020 Last data filed at 09/10/2017 0600 Gross per 24 hour  Intake 744.78 ml  Output 635 ml  Net 109.78 ml   Filed Weights   09/07/17 1926 09/09/17 0600 09/10/17 0600  Weight: 60.3 kg (133 lb) 66.3 kg (146 lb 2.6 oz) 68.5 kg (151 lb 0.2 oz)    Examination:  General exam: Appears calm and comfortable  Respiratory system: Clear to auscultation. Respiratory effort normal. Cardiovascular system: S1 & S2  heard, RRR. No JVD, murmurs, rubs, gallops or clicks. No pedal edema. Gastrointestinal system: Abdomen is nondistended, soft and nontender. No organomegaly or masses felt. Normal bowel sounds heard. Central nervous system: Alert and oriented. No focal neurological deficits. Extremities: Symmetric 5 x 5 power. Skin: No rashes, lesions or ulcers Psychiatry: Judgement and insight appear normal. Mood & affect appropriate.     Data Reviewed: I have personally reviewed following labs and imaging studies  CBC: Recent Labs  Lab 09/07/17 2009 09/08/17 0609 09/08/17 1207 09/08/17 1453 09/09/17 0332 09/09/17 1725 09/10/17 0440  WBC 10.1 9.9  --   --  10.6*  --  9.9  NEUTROABS 7.8*  --    --   --   --   --   --   HGB 5.1* 8.0* 8.1* 8.5* 6.7* 9.9* 8.8*  HCT 17.5* 25.6* 25.0* 25.0* 21.5* 30.9* 27.6*  MCV 88.4 87.1  --   --  86.7  --  87.9  PLT 539* 385  --   --  275  --  510   Basic Metabolic Panel: Recent Labs  Lab 09/07/17 2009 09/08/17 0609 09/08/17 1453 09/09/17 0332 09/10/17 0440  NA 140 141 142 141 139  K 4.5 4.8 4.4 4.7 4.6  CL 108 111 113* 112* 107  CO2 20* 16*  --  20* 20*  GLUCOSE 87 68* 82 71 82  BUN 54* 50* 41* 45* 42*  CREATININE 3.33* 3.02* 2.90* 3.07* 3.28*  CALCIUM 8.6* 8.1*  --  8.0* 7.9*  MG  --  2.1  --   --   --   PHOS  --  5.1*  --   --   --    GFR: Estimated Creatinine Clearance: 19 mL/min (A) (by C-G formula based on SCr of 3.28 mg/dL (H)). Liver Function Tests: Recent Labs  Lab 09/07/17 2009 09/09/17 0332  AST 19 14*  ALT 12 10  ALKPHOS 57 46  BILITOT 0.5 0.5  PROT 7.9 6.0*  ALBUMIN 2.7* 2.2*   No results for input(s): LIPASE, AMYLASE in the last 168 hours. No results for input(s): AMMONIA in the last 168 hours. Coagulation Profile: Recent Labs  Lab 09/07/17 2009  INR 1.09   Cardiac Enzymes: Recent Labs  Lab 09/08/17 0133  TROPONINI <0.03   BNP (last 3 results) No results for input(s): PROBNP in the last 8760 hours. HbA1C: No results for input(s): HGBA1C in the last 72 hours. CBG: Recent Labs  Lab 09/08/17 2335 09/09/17 0610 09/09/17 1149 09/09/17 2307 09/10/17 0309  GLUCAP 87 78 93 98 87   Lipid Profile: No results for input(s): CHOL, HDL, LDLCALC, TRIG, CHOLHDL, LDLDIRECT in the last 72 hours. Thyroid Function Tests: No results for input(s): TSH, T4TOTAL, FREET4, T3FREE, THYROIDAB in the last 72 hours. Anemia Panel: Recent Labs    09/07/17 2009  VITAMINB12 442  FOLATE 8.9  FERRITIN 343*  TIBC 224*  IRON 9*  RETICCTPCT 3.7*   Sepsis Labs: No results for input(s): PROCALCITON, LATICACIDVEN in the last 168 hours.  Recent Results (from the past 240 hour(s))  Surgical pcr screen     Status: None     Collection Time: 09/08/17  4:59 AM  Result Value Ref Range Status   MRSA, PCR NEGATIVE NEGATIVE Final   Staphylococcus aureus NEGATIVE NEGATIVE Final    Comment: (NOTE) The Xpert SA Assay (FDA approved for NASAL specimens in patients 7 years of age and older), is one component of a comprehensive surveillance program. It is not intended  to diagnose infection nor to guide or monitor treatment. Performed at [facility], [place] [address], [place], [place] [address]          Radiology Studies: Dg Chest Port 1 View  Result Date: 09/08/2017 CLINICAL DATA:  Catheter placement EXAM: PORTABLE CHEST 1 VIEW COMPARISON:  09/08/2017, CT chest 09/08/2017, radiograph 09/07/2017, 06/06/2017, 06/09/2017 FINDINGS: Post sternotomy changes and surgical clips in the right axilla. Aortic stent graft with interval grafting at the ascending aorta and arch. Right-sided central venous Swan-Ganz catheter tip projects over the right pulmonary artery. The right lung is clear. There is scarring at the left base. Stable mild cardiomegaly. Stable enlarged mediastinal silhouette with soft tissue silhouetting the aortic stent graft, corresponding to the mediastinal hematoma and changes noted on the recent chest CT. There is no pneumothorax. IMPRESSION: 1. Interim insertion of right IJ Swan-Ganz catheter with tip overlying the right pulmonary artery. 2. Interval grafting of the ascending aorta arch and knob since prior radiograph. 3. Stable widened mediastinum with soft tissue silhouetting the aortic arch, corresponding to mediastinal hematoma and changes noted on recent CT. Electronically Signed   By: Donavan Foil M.D.   On: 09/08/2017 19:09   Dg Chest Port 1 View  Result Date: 09/08/2017 CLINICAL DATA:  Hypertension.  Asthma. EXAM: PORTABLE CHEST 1 VIEW COMPARISON:  Chest CT 09/08/2017.  Chest x-ray 09/07/2017. FINDINGS: Prior CABG. Stable cardiomegaly. Thoracic aortic aneurysm with stent graft again noted. This  appears stable. Lungs are clear of acute infiltrates. Left-sided pleural thickening versus pleural effusion. No pneumothorax. Surgical clips right chest. IMPRESSION: 1. Prior CABG. Stable cardiomegaly. Thoracic aortic aneurysm with stent graft again noted. Mild left-sided pleural thickening versus pleural scarring. 2.  No acute pulmonary disease. Electronically Signed   By: Marcello Moores  Register   On: 09/08/2017 12:32        Scheduled Meds: . amLODipine  5 mg Oral Daily  . Chlorhexidine Gluconate Cloth  6 each Topical Daily  . darbepoetin (ARANESP) injection - NON-DIALYSIS  100 mcg Subcutaneous Q Fri-1800  . docusate sodium  100 mg Oral Daily  . famotidine  20 mg Oral Daily  . heparin  5,000 Units Subcutaneous Q8H  . insulin aspart  1-3 Units Subcutaneous Q6H  . metoprolol tartrate  12.5 mg Oral BID  . pantoprazole  40 mg Oral Daily  . sodium chloride flush  10-40 mL Intracatheter Q12H   Continuous Infusions: . sodium chloride    . sodium chloride Stopped (09/09/17 1800)  . magnesium sulfate 1 - 4 g bolus IVPB       LOS: 2 days    Time spent: 32 minutes.     Hosie Poisson, MD [facility] Pager [phone]   If 7PM-7AM, please contact night-coverage [email or web address] Password [email or web address] 09/10/2017, 10:20 AM

## 2017-09-10 NOTE — Plan of Care (Signed)
  Problem: Clinical Measurements: Goal: Cardiovascular complication will be avoided Outcome: Progressing   Problem: Activity: Goal: Risk for activity intolerance will decrease Outcome: Progressing   Problem: Nutrition: Goal: Adequate nutrition will be maintained Outcome: Progressing   Problem: Pain Managment: Goal: General experience of comfort will improve Outcome: Progressing   Problem: Safety: Goal: Ability to remain free from injury will improve Outcome: Progressing   Problem: Skin Integrity: Goal: Risk for impaired skin integrity will decrease Outcome: Progressing

## 2017-09-11 ENCOUNTER — Inpatient Hospital Stay (HOSPITAL_COMMUNITY): Payer: Medicare Other

## 2017-09-11 DIAGNOSIS — M25562 Pain in left knee: Secondary | ICD-10-CM

## 2017-09-11 DIAGNOSIS — M25462 Effusion, left knee: Secondary | ICD-10-CM

## 2017-09-11 DIAGNOSIS — N179 Acute kidney failure, unspecified: Secondary | ICD-10-CM

## 2017-09-11 DIAGNOSIS — M7989 Other specified soft tissue disorders: Secondary | ICD-10-CM

## 2017-09-11 DIAGNOSIS — D62 Acute posthemorrhagic anemia: Secondary | ICD-10-CM

## 2017-09-11 DIAGNOSIS — N189 Chronic kidney disease, unspecified: Secondary | ICD-10-CM

## 2017-09-11 LAB — TYPE AND SCREEN
ABO/RH(D): O POS
Antibody Screen: NEGATIVE
UNIT DIVISION: 0
UNIT DIVISION: 0
UNIT DIVISION: 0
UNIT DIVISION: 0
UNIT DIVISION: 0
Unit division: 0
Unit division: 0
Unit division: 0
Unit division: 0
Unit division: 0

## 2017-09-11 LAB — BPAM RBC
BLOOD PRODUCT EXPIRATION DATE: 201908172359
BLOOD PRODUCT EXPIRATION DATE: 201908172359
BLOOD PRODUCT EXPIRATION DATE: 201908192359
BLOOD PRODUCT EXPIRATION DATE: 201908192359
BLOOD PRODUCT EXPIRATION DATE: 201908202359
BLOOD PRODUCT EXPIRATION DATE: 201908242359
Blood Product Expiration Date: 201908192359
Blood Product Expiration Date: 201908202359
Blood Product Expiration Date: 201908212359
Blood Product Expiration Date: 201908242359
ISSUE DATE / TIME: 201907242132
ISSUE DATE / TIME: 201907250117
ISSUE DATE / TIME: 201907251425
ISSUE DATE / TIME: 201907251425
ISSUE DATE / TIME: 201907251425
ISSUE DATE / TIME: 201907260558
ISSUE DATE / TIME: 201907260931
UNIT TYPE AND RH: 5100
UNIT TYPE AND RH: 5100
UNIT TYPE AND RH: 5100
UNIT TYPE AND RH: 5100
UNIT TYPE AND RH: 5100
UNIT TYPE AND RH: 5100
UNIT TYPE AND RH: 5100
Unit Type and Rh: 5100
Unit Type and Rh: 5100
Unit Type and Rh: 5100

## 2017-09-11 LAB — C-REACTIVE PROTEIN: CRP: 19.8 mg/dL — ABNORMAL HIGH (ref <1.0)

## 2017-09-11 LAB — SYNOVIAL CELL COUNT + DIFF, W/ CRYSTALS
Crystals, Fluid: NONE SEEN
Lymphocytes-Synovial Fld: 7 % (ref 0–20)
MONOCYTE-MACROPHAGE-SYNOVIAL FLUID: 8 % — AB (ref 50–90)
NEUTROPHIL, SYNOVIAL: 85 % — AB (ref 0–25)
WBC, SYNOVIAL: 2880 /mm3 — AB (ref 0–200)

## 2017-09-11 LAB — BASIC METABOLIC PANEL
ANION GAP: 6 (ref 5–15)
BUN: 51 mg/dL — AB (ref 8–23)
CALCIUM: 8 mg/dL — AB (ref 8.9–10.3)
CO2: 21 mmol/L — AB (ref 22–32)
Chloride: 109 mmol/L (ref 98–111)
Creatinine, Ser: 3.9 mg/dL — ABNORMAL HIGH (ref 0.44–1.00)
GFR calc Af Amer: 13 mL/min — ABNORMAL LOW (ref >60)
GFR, EST NON AFRICAN AMERICAN: 11 mL/min — AB (ref >60)
GLUCOSE: 122 mg/dL — AB (ref 70–99)
POTASSIUM: 5.5 mmol/L — AB (ref 3.5–5.1)
Sodium: 136 mmol/L (ref 135–145)

## 2017-09-11 LAB — HEMOGLOBIN AND HEMATOCRIT, BLOOD
HEMATOCRIT: 30.1 % — AB (ref 36.0–46.0)
Hemoglobin: 9.4 g/dL — ABNORMAL LOW (ref 12.0–15.0)

## 2017-09-11 LAB — GLUCOSE, CAPILLARY
GLUCOSE-CAPILLARY: 118 mg/dL — AB (ref 70–99)
GLUCOSE-CAPILLARY: 148 mg/dL — AB (ref 70–99)
GLUCOSE-CAPILLARY: 156 mg/dL — AB (ref 70–99)
Glucose-Capillary: 141 mg/dL — ABNORMAL HIGH (ref 70–99)
Glucose-Capillary: 141 mg/dL — ABNORMAL HIGH (ref 70–99)
Glucose-Capillary: 132 mg/dL — ABNORMAL HIGH (ref 70–99)

## 2017-09-11 LAB — SEDIMENTATION RATE: SED RATE: 109 mm/h — AB (ref 0–22)

## 2017-09-11 MED ORDER — INSULIN ASPART 100 UNIT/ML ~~LOC~~ SOLN
1.0000 [IU] | Freq: Three times a day (TID) | SUBCUTANEOUS | Status: DC
  Administered 2017-09-11 (×2): 1 [IU] via SUBCUTANEOUS
  Administered 2017-09-12: 2 [IU] via SUBCUTANEOUS
  Administered 2017-09-14: 1 [IU] via SUBCUTANEOUS

## 2017-09-11 MED ORDER — METOPROLOL TARTRATE 12.5 MG HALF TABLET
12.5000 mg | ORAL_TABLET | Freq: Every day | ORAL | Status: DC
  Administered 2017-09-12 – 2017-09-22 (×11): 12.5 mg via ORAL
  Filled 2017-09-11 (×11): qty 1

## 2017-09-11 MED ORDER — SODIUM POLYSTYRENE SULFONATE 15 GM/60ML PO SUSP
30.0000 g | Freq: Once | ORAL | Status: AC
  Administered 2017-09-11: 30 g via ORAL
  Filled 2017-09-11: qty 120

## 2017-09-11 MED ORDER — SODIUM CHLORIDE 0.45 % IV SOLN
INTRAVENOUS | Status: AC
Start: 2017-09-11 — End: 2017-09-12
  Administered 2017-09-11 – 2017-09-12 (×2): via INTRAVENOUS

## 2017-09-11 MED ORDER — POLYETHYLENE GLYCOL 3350 17 G PO PACK
17.0000 g | PACK | Freq: Every day | ORAL | Status: DC | PRN
  Administered 2017-09-11: 17 g via ORAL
  Filled 2017-09-11: qty 1

## 2017-09-11 NOTE — Progress Notes (Signed)
Spoke with Mliss Sax RN, aware of order to d/c CVC.

## 2017-09-11 NOTE — Evaluation (Signed)
Physical Therapy Evaluation Patient Details Name: Diana Walters MRN: [id number] DOB: 1955-03-26 Today's Date: 09/11/2017   History of Present Illness  62 y.o. female s/p TEVAR 7/25. Pt also presenting with severe L knee pain. PMH includes: CVA, HTN, Seizure, AKI thoracic aortic aneurysm, RA     Clinical Impression  Pt admitted with above diagnosis. Pt currently with functional limitations due to the deficits listed below (see PT Problem List). PTA, pt living alone in 1 story home with stairs to enter, mod I with all mobility, driving, reports intermittent use of SPC when RA would flare up. Today pt presents with severe L knee pain that greatly limits her assessment and mobility. Able to ambulate 63' with RW and min guard. Patient wishes strongly to go home to continue therapy, believes she can call family to stay with her for several days, agrees she needs some help initially. PT will progress OOB mobility as tolerated and focus on stair training for home entry next session if able. VSS on RA this session.  Pt will benefit from skilled PT to increase their independence and safety with mobility to allow discharge to the venue listed below.       Follow Up Recommendations Home health PT;Supervision/Assistance - 24 hour    Equipment Recommendations  Rolling walker with 5" wheels;3in1 (PT)    Recommendations for Other Services       Precautions / Restrictions Precautions Precautions: Fall      Mobility  Bed Mobility Overal bed mobility: Needs Assistance Bed Mobility: Supine to Sit;Sit to Supine     Supine to sit: Supervision Sit to supine: Supervision   General bed mobility comments: increased time and effort due to pain  Transfers Overall transfer level: Needs assistance Equipment used: Rolling walker (2 wheeled) Transfers: Sit to/from Stand Sit to Stand: Min guard         General transfer comment: Min guard for safety.   Ambulation/Gait Ambulation/Gait assistance: Min  guard;Supervision Gait Distance (Feet): 65 Feet Assistive device: Rolling walker (2 wheeled) Gait Pattern/deviations: Step-to pattern;Antalgic Gait velocity: decreased   General Gait Details: limited by L knee pain, pt with very slow and painful gait. VSS on RA.   Stairs            Wheelchair Mobility    Modified Rankin (Stroke Patients Only)       Balance Overall balance assessment: Mild deficits observed, not formally tested                                           Pertinent Vitals/Pain Pain Assessment: 0-10 Pain Score: 8  Pain Location: L knee Pain Descriptors / Indicators: Aching Pain Intervention(s): Limited activity within patient's tolerance;Monitored during session;Premedicated before session    Home Living Family/patient expects to be discharged to:: Private residence Living Arrangements: Alone Available Help at Discharge: Family;Available PRN/intermittently Type of Home: House Home Access: Stairs to enter Entrance Stairs-Rails: Right Entrance Stairs-Number of Steps: 3 Home Layout: One level Home Equipment: Walker - 2 wheels;Cane - single point      Prior Function Level of Independence: Independent with assistive device(s)         Comments: intermittent use of AD when RA gets bad, lives alone, I with mobiltiy and ADLs     Hand Dominance   Dominant Hand: Right    Extremity/Trunk Assessment   Upper Extremity Assessment Upper Extremity Assessment:  Defer to OT evaluation    Lower Extremity Assessment Lower Extremity Assessment: (LLE NT due to pain. RLE WNL)       Communication   Communication: No difficulties  Cognition Arousal/Alertness: Awake/alert Behavior During Therapy: WFL for tasks assessed/performed Overall Cognitive Status: Within Functional Limits for tasks assessed                                        General Comments      Exercises     Assessment/Plan    PT Assessment Patient  needs continued PT services  PT Problem List Decreased strength;Decreased range of motion;Decreased activity tolerance;Decreased knowledge of precautions;Pain       PT Treatment Interventions DME instruction;Gait training;Stair training;Functional mobility training;Therapeutic activities;Therapeutic exercise;Balance training    PT Goals (Current goals can be found in the Care Plan section)  Acute Rehab PT Goals Patient Stated Goal: to go home with HHPT and family support "theres no place like home" PT Goal Formulation: With patient Time For Goal Achievement: 09/18/17 Potential to Achieve Goals: Good    Frequency Min 3X/week   Barriers to discharge Decreased caregiver support pt lives alone but believes she can find suport    Co-evaluation               AM-PAC PT "6 Clicks" Daily Activity  Outcome Measure Difficulty turning over in bed (including adjusting bedclothes, sheets and blankets)?: A Little Difficulty moving from lying on back to sitting on the side of the bed? : A Little Difficulty sitting down on and standing up from a chair with arms (e.g., wheelchair, bedside commode, etc,.)?: A Little Help needed moving to and from a bed to chair (including a wheelchair)?: A Little Help needed walking in hospital room?: A Little Help needed climbing 3-5 steps with a railing? : A Lot 6 Click Score: 17    End of Session Equipment Utilized During Treatment: Gait belt Activity Tolerance: Patient limited by pain Patient left: in bed;with call bell/phone within reach Nurse Communication: Mobility status PT Visit Diagnosis: Unsteadiness on feet (R26.81);Pain;Difficulty in walking, not elsewhere classified (R26.2) Pain - Right/Left: Left Pain - part of body: Knee    Time: 1600-1640 PT Time Calculation (min) (ACUTE ONLY): 40 min   Charges:   PT Evaluation $PT Eval Moderate Complexity: 1 Mod PT Treatments $Gait Training: 8-22 mins $Therapeutic Activity: 8-22 mins        Reinaldo Berber, PT, DPT Acute Rehab Services Pager: [phone]    Reinaldo Berber 09/11/2017, 4:36 PM

## 2017-09-11 NOTE — Progress Notes (Signed)
Preliminary notes--Left upper extremity venous duplex exam completed. Negative for DVT. Severe edema noted.  Hongying Lara Palinkas (RDMS RVT) 09/11/17 11:09 AM

## 2017-09-11 NOTE — Progress Notes (Signed)
   VASCULAR SURGERY ASSESSMENT & PLAN:   3 Days Post-Op s/p: TEVAR.  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  She has no real complaints.  RENAL: Good urine output.  Today's labs are pending.  Steady progress.  SUBJECTIVE:   No specific complaints.  Tolerating her diet.  PHYSICAL EXAM:   Vitals:   09/11/17 0024 09/11/17 0300 09/11/17 0400 09/11/17 0500  BP:   140/61   Pulse: (!) 59 64 61   Resp: 17 (!) 9 12   Temp:   98.1 F (36.7 C)   TempSrc:   Oral   SpO2: 99% 98% 100%   Weight:    149 lb 4 oz (67.7 kg)  Height:       Right groin site looks fine without hematoma.  LABS:   Lab Results  Component Value Date   WBC 9.9 09/10/2017   HGB 9.4 (L) 09/11/2017   HCT 30.1 (L) 09/11/2017   MCV 87.9 09/10/2017   PLT 215 09/10/2017   Lab Results  Component Value Date   CREATININE 3.28 (H) 09/10/2017   Lab Results  Component Value Date   INR 1.09 09/07/2017   CBG (last 3)  Recent Labs    09/10/17 2118 09/11/17 0000 09/11/17 0649  GLUCAP 138* 156* 141*    PROBLEM LIST:    Active Problems:   Symptomatic anemia   CURRENT MEDS:   . amLODipine  5 mg Oral Daily  . Chlorhexidine Gluconate Cloth  6 each Topical Daily  . darbepoetin (ARANESP) injection - NON-DIALYSIS  100 mcg Subcutaneous Q Fri-1800  . docusate sodium  100 mg Oral Daily  . famotidine  20 mg Oral Daily  . heparin  5,000 Units Subcutaneous Q8H  . insulin aspart  1-3 Units Subcutaneous Q6H  . methylPREDNISolone (SOLU-MEDROL) injection  60 mg Intravenous Q24H  . metoprolol tartrate  12.5 mg Oral BID  . pantoprazole  40 mg Oral Daily  . sodium chloride flush  10-40 mL Intracatheter Q12H    Deitra Mayo Beeper: [phone] Office: [phone] 09/11/2017

## 2017-09-11 NOTE — Progress Notes (Signed)
3 Days Post-Op Procedure(s) (LRB): THORACIC AORTIC ENDOVASCULAR STENT GRAFT (N/A) Subjective: No complaints this AM  Objective: Vital signs in last 24 hours: Temp:  [97.7 F (36.5 C)-98.6 F (37 C)] 97.9 F (36.6 C) (07/28 0801) Pulse Rate:  [40-76] 70 (07/28 0801) Cardiac Rhythm: Normal sinus rhythm (07/28 0700) Resp:  [8-17] 16 (07/28 0801) BP: (130-162)/(51-78) 156/64 (07/28 0801) SpO2:  [95 %-100 %] 100 % (07/28 0801) Weight:  [149 lb 4 oz (67.7 kg)] 149 lb 4 oz (67.7 kg) (07/28 0500)  Hemodynamic parameters for last 24 hours:    Intake/Output from previous day: 07/27 0701 - 07/28 0700 In: 14 [P.O.:480; I.V.:10] Out: 200 [Urine:200] Intake/Output this shift: Total I/O In: 240 [P.O.:240] Out: 100 [Urine:100]  General appearance: alert, cooperative and no distress Neurologic: intact Heart: regular rate and rhythm Lungs: clear to auscultation bilaterally  Lab Results: Recent Labs    09/09/17 0332  09/10/17 0440 09/10/17 1712 09/11/17 0500  WBC 10.6*  --  9.9  --   --   HGB 6.7*   < > 8.8* 9.9* 9.4*  HCT 21.5*   < > 27.6* 31.0* 30.1*  PLT 275  --  215  --   --    < > = values in this interval not displayed.   BMET:  Recent Labs    09/09/17 0332 09/10/17 0440  NA 141 139  K 4.7 4.6  CL 112* 107  CO2 20* 20*  GLUCOSE 71 82  BUN 45* 42*  CREATININE 3.07* 3.28*  CALCIUM 8.0* 7.9*    PT/INR: No results for input(s): LABPROT, INR in the last 72 hours. ABG    Component Value Date/Time   PHART 7.345 (L) 09/09/2017 0334   HCO3 18.5 (L) 09/09/2017 0334   TCO2 20 (L) 09/09/2017 0334   ACIDBASEDEF 7.0 (H) 09/09/2017 0334   O2SAT 94.0 09/09/2017 0334   CBG (last 3)  Recent Labs    09/11/17 0000 09/11/17 0649 09/11/17 0800  GLUCAP 156* 141* 141*    Assessment/Plan: S/P Procedure(s) (LRB): THORACIC AORTIC ENDOVASCULAR STENT GRAFT (N/A) -  Stable POD # 3 Will dc central line Plan per Vascular   LOS: 3 days    Melrose Nakayama 09/11/2017

## 2017-09-11 NOTE — Consult Note (Signed)
ORTHOPAEDIC CONSULTATION  REQUESTING PHYSICIAN: Bonnielee Haff, MD  PCP:  Renato Shin, MD  Chief Complaint: Left knee pain  HPI: Diana Walters is a 62 y.o. female who complains of a couple of weeks now of increasing left knee pain.  She does have a 20+ year history of rheumatoid arthritis.  She has over the last few months about 8 or 9 months ago been taken off of her biologic treatment for the rheumatoid arthritis and is only on methotrexate at this time.  The onset of pain does not correlate with that however.  She denies any recent trauma.  She has had mostly insidious onset of this increasing left knee pain.  She does have a history of knee arthrocentesis as well as steroid injections that have been beneficial in the past.  Currently she is admitted to the cardiac progressive unit following an endovascular procedure for a thoracic aneurysm leak.  She has done well postoperatively with the exception of acute kidney injury.  That seems to be resolving.  Currently orthopedics was called due to increasing left knee pain and inability to mobilize secondary to that.  Past Medical History:  Diagnosis Date  . ANEMIA 10/17/2009  . ANXIETY 10/28/2006  . Arthritis   . ASTHMA 10/28/2006   no inhalers per pt  . Blood transfusion without reported diagnosis   . Depression   . Duodenitis without mention of hemorrhage 2005  . Heart murmur   . HYPERCHOLESTEROLEMIA 04/01/2008  . HYPERTENSION 10/28/2006  . Menopause    age 30  . Osteoporosis    bil knees  . SEIZURE DISORDER 04/01/2008   past- medications caused it   . Stroke ([place]) 11/2012  . THORACIC AORTIC ANEURYSM 04/01/2008   Past Surgical History:  Procedure Laterality Date  . ESOPHAGOGASTRODUODENOSCOPY  01/29/2004  . 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 LOOP RECORDER IMPLANT N/A 12/01/2012   Procedure: LOOP RECORDER IMPLANT;  Surgeon: Coralyn Mark, MD;  Location: [facility] CATH [facility];   Service: Cardiovascular;  Laterality: N/A;  . TEE WITHOUT CARDIOVERSION N/A 12/01/2012   Procedure: TRANSESOPHAGEAL ECHOCARDIOGRAM (TEE);  Surgeon: Fay Records, MD;  Location: [facility] ENDOSCOPY;  Service: Cardiovascular;  Laterality: N/A;  . VASCULAR SURGERY  8-9 years ago per patient   thoracic aortic anyuresm repair with patch    Social History   Socioeconomic History  . Marital status: Single    Spouse name: Not on file  . Number of children: Not on file  . Years of education: Not on file  . Highest education level: Not on file  Occupational History  . Not on file  Social Needs  . Financial resource strain: Not on file  . Food insecurity:    Worry: Not on file    Inability: Not on file  . Transportation needs:    Medical: Not on file    Non-medical: Not on file  Tobacco Use  . Smoking status: Current Every Day Smoker    Packs/day: 0.25    Years: 12.00    Pack years: 3.00    Types: Cigarettes  . Smokeless tobacco: Never Used  . Tobacco comment: patient is ready to stop  Substance and Sexual Activity  . Alcohol use: Not Currently    Alcohol/week: 0.0 oz  . Drug use: Yes    Types: Marijuana  . Sexual activity: Not Currently    Birth control/protection: None  Lifestyle  . Physical activity:  Days per week: Not on file    Minutes per session: Not on file  . Stress: Not on file  Relationships  . Social connections:    Talks on phone: Not on file    Gets together: Not on file    Attends religious service: Not on file    Active member of club or organization: Not on file    Attends meetings of clubs or organizations: Not on file    Relationship status: Not on file  Other Topics Concern  . Not on file  Social History Narrative  . Not on file   Family History  Problem Relation Age of Onset  . Breast cancer Mother   . Heart disease Mother        before age 2  . Colon cancer Neg Hx   . Esophageal cancer Neg Hx   . Pancreatic cancer Neg Hx   . Rectal cancer Neg  Hx   . Stomach cancer Neg Hx    Allergies  Allergen Reactions  . Ibuprofen Other (See Comments)    Can't take due to medications  PT DOESN'T RECALL THIS ALLERGY  . Codeine Other (See Comments)    Hallucinations  PT DOESN'T RECALL THIS ALLERGY   Prior to Admission medications   Medication Sig Start Date End Date Taking? Authorizing Provider  acetaminophen (TYLENOL) 325 MG tablet Take 650 mg by mouth every 6 (six) hours as needed for moderate pain.    Yes [provider]  amLODipine (NORVASC) 5 MG tablet Take 1 tablet (5 mg total) by mouth daily. 07/05/17 10/03/17 Yes Daune Perch, NP  aspirin EC 81 MG tablet Take 1 tablet (81 mg total) by mouth daily. 06/22/17  Yes Daune Perch, NP  atorvastatin (LIPITOR) 40 MG tablet Take 1 tablet (40 mg total) by mouth daily. 06/22/17  Yes Daune Perch, NP  furosemide (LASIX) 40 MG tablet Take 1 tablet (40 mg total) by mouth daily. 06/22/17  Yes Daune Perch, NP  hydrALAZINE (APRESOLINE) 50 MG tablet Take 1.5 tablets (75 mg total) by mouth 3 (three) times daily. 06/22/17  Yes Daune Perch, NP  isosorbide mononitrate (IMDUR) 60 MG 24 hr tablet Take 1 tablet (60 mg total) by mouth daily. 06/22/17  Yes Daune Perch, NP  Menthol-Camphor (ICY HOT ADVANCED RELIEF) 16-11 % CREA Apply 1 application topically daily as needed (knee pain).   Yes [provider]  metoprolol succinate (TOPROL-XL) 25 MG 24 hr tablet Take 25 mg by mouth daily. Take with or immediately following a meal. Take with 50 mg to equal 75 mg daily.   Yes [provider]  Multiple Vitamin (MULTIVITAMIN) tablet Take 1 tablet by mouth daily.     Yes [provider]  Simethicone (GAS-X PO) Take 2 tablets by mouth daily as needed (flatulence).    Yes [provider]  sodium bicarbonate 650 MG tablet Take 650 mg by mouth 3 (three) times daily. 08/19/17  Yes [provider]   Dg Knee Complete 4 Views Left  Result Date: 09/10/2017 CLINICAL  DATA:  Chronic left knee pain.  Severe pain. EXAM: LEFT KNEE - COMPLETE 4+ VIEW COMPARISON:  None. FINDINGS: There is near complete loss of joint space with bone-on-bone configuration in both the medial and lateral compartments of the knee. A moderate-sized joint effusion is present. Subcutaneous edema is also present about the right knee. IMPRESSION: 1. 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 2. Moderate joint effusion suggesting  acute inflammation. 3. Superficial edema is also present. Electronically Signed   By: San Morelle M.D.   On: 09/10/2017 16:01    Positive ROS: All other systems have been reviewed and were otherwise negative with the exception of those mentioned in the HPI and as above.  Physical Exam: General: Alert, no acute distress Cardiovascular: No pedal edema Respiratory: No cyanosis, no use of accessory musculature GI: No organomegaly, abdomen is soft and non-tender Skin: No lesions in the area of chief complaint Neurologic: Sensation intact distally Psychiatric: Patient is competent for consent with normal mood and affect Lymphatic: No axillary or cervical lymphadenopathy  MUSCULOSKELETAL:  Left knee:  No open wounds.  She is in valgus alignment.  The knee feels slightly warm to the touch but mostly the same as the right knee given that she is been under the covers.  She does have quite a bit of pain with passive range of motion and only able to range her from her resting position of 5 degrees of flexion to about 40 degrees of flexion.  Point tenderness noted on the lateral joint line.  Nontender medially.  There is a mild effusion appreciated but most of the swelling about the knee appears to be more in the pericapsular region.  Otherwise neurovascularly intact distal.  Assessment: 1.  Acute on chronic left knee pain with history of rheumatoid arthritis. 2.  Left knee effusion.   Plan: -I discussed my differential  with [name] which include rheumatoid arthritis exacerbation.  She does have elevated CRP and erythrocyte sedimentation rate I could be consistent with recent surgery but also could be consistent with her known history of RA.  Other differentials would include osteoarthritic flare or septic arthritis. -We proceeded with a bedside arthrocentesis of the left knee.  This yielded about 6 cc of bloody synovial fluid.  We will send this to the lab for analysis.  -I will place an order for nothing by mouth tonight at midnight  -For the chance that this is a septic joint.  We will follow her labs on the fluid analysis. - otherwise she can continue weightbearing as tolerated.     Nicholes Stairs, MD Cell [phone]    09/11/2017 6:15 PM

## 2017-09-11 NOTE — Progress Notes (Signed)
[facility] PROGRESS NOTE  Diana Walters CHE:[id number] DOB: 1955/02/23 DOA: 09/07/2017  PCP: Renato Shin, MD  Brief History/Interval Summary: 62 y/o F with hx of complex hybrid thoracic aneurysm repair at [facility] (03/31/12), asthma, anemia, RA(reportedlyon methotrexatebut not on med list), smoker, marijuana abuse, HTN, CVA and seizures admitted early 7/25 am with generalized weakness and mid-scapular back pain for three weeks. Work up notable for CT which showed an acute endovascular leak at the level of the aortic arch with acute extravasation of contrast into the native aneurysmal sac and extensive hematoma in the mediastinum around the arch and descending aorta. Hgb down to 5.1 from baseline of 9-10. She was transfused 2 units PRBC's. VVS consulted (Dr. Donzetta Matters) and recommended stabilization prior to surgery. CVTS involved in her care as well. She underwent endovascular repair of the aortic arch. Post op, course complicated for  anemia of blood loss and AKI.  Patient also has developed left knee swelling and pain.  Reason for Visit: Thoracic aorta endovascular leak status post repair.  Consultants: Cardiothoracic surgery.  Vascular surgery.  Procedures: Thoracic endovascular stenting for a type III endoleak on 7/25   Antibiotics: None  Subjective/Interval History: Patient complains of pain and swelling in the left knee.  This is more than usual.  She does have a history of rheumatoid arthritis.  Has had difficulty walking as a result of this pain.  Denies any chest pain or shortness of breath.  Has been making urine.  ROS: Denies any headaches  Objective:  Vital Signs  Vitals:   09/11/17 0300 09/11/17 0400 09/11/17 0500 09/11/17 0801  BP:  140/61  (!) 156/64  Pulse: 64 61  70  Resp: (!) 9 12  16   Temp:  98.1 F (36.7 C)  97.9 F (36.6 C)  TempSrc:  Oral  Oral  SpO2: 98% 100%  100%  Weight:   67.7 kg (149 lb 4 oz)   Height:        Intake/Output Summary (Last 24  hours) at 09/11/2017 1057 Last data filed at 09/11/2017 0900 Gross per 24 hour  Intake 610 ml  Output 300 ml  Net 310 ml   Filed Weights   09/09/17 0600 09/10/17 0600 09/11/17 0500  Weight: 66.3 kg (146 lb 2.6 oz) 68.5 kg (151 lb 0.2 oz) 67.7 kg (149 lb 4 oz)    General appearance: alert, cooperative, appears stated age and no distress Head: Normocephalic, without obvious abnormality, atraumatic Resp: clear to auscultation bilaterally Cardio: regular rate and rhythm, S1, S2 normal, no murmur, click, rub or gallop GI: soft, non-tender; bowel sounds normal; no masses,  no organomegaly Extremities: Swollen left knee is noted slightly more warm to touch compared to the right.  Limited range of motion.  Tender.  Left arm noted to be swollen compared to the right. Neurologic: No obvious focal neurological deficits  Lab Results:  Data Reviewed: I have personally reviewed following labs and imaging studies  CBC: Recent Labs  Lab 09/07/17 2009 09/08/17 0609  09/09/17 0332 09/09/17 1725 09/10/17 0440 09/10/17 1712 09/11/17 0500  WBC 10.1 9.9  --  10.6*  --  9.9  --   --   NEUTROABS 7.8*  --   --   --   --   --   --   --   HGB 5.1* 8.0*   < > 6.7* 9.9* 8.8* 9.9* 9.4*  HCT 17.5* 25.6*   < > 21.5* 30.9* 27.6* 31.0* 30.1*  MCV 88.4 87.1  --  86.7  --  87.9  --   --   PLT 539* 385  --  275  --  215  --   --    < > = values in this interval not displayed.    Basic Metabolic Panel: Recent Labs  Lab 09/07/17 2009 09/08/17 0609 09/08/17 1453 09/09/17 0332 09/10/17 0440  NA 140 141 142 141 139  K 4.5 4.8 4.4 4.7 4.6  CL 108 111 113* 112* 107  CO2 20* 16*  --  20* 20*  GLUCOSE 87 68* 82 71 82  BUN 54* 50* 41* 45* 42*  CREATININE 3.33* 3.02* 2.90* 3.07* 3.28*  CALCIUM 8.6* 8.1*  --  8.0* 7.9*  MG  --  2.1  --   --   --   PHOS  --  5.1*  --   --   --     GFR: Estimated Creatinine Clearance: 18.8 mL/min (A) (by C-G formula based on SCr of 3.28 mg/dL (H)).  Liver Function  Tests: Recent Labs  Lab 09/07/17 2009 09/09/17 0332  AST 19 14*  ALT 12 10  ALKPHOS 57 46  BILITOT 0.5 0.5  PROT 7.9 6.0*  ALBUMIN 2.7* 2.2*    Coagulation Profile: Recent Labs  Lab 09/07/17 2009  INR 1.09    Cardiac Enzymes: Recent Labs  Lab 09/08/17 0133  TROPONINI <0.03    CBG: Recent Labs  Lab 09/10/17 1710 09/10/17 2118 09/11/17 0000 09/11/17 0649 09/11/17 0800  GLUCAP 114* 138* 156* 141* 141*     Recent Results (from the past 240 hour(s))  Surgical pcr screen     Status: None   Collection Time: 09/08/17  4:59 AM  Result Value Ref Range Status   MRSA, PCR NEGATIVE NEGATIVE Final   Staphylococcus aureus NEGATIVE NEGATIVE Final    Comment: (NOTE) The Xpert SA Assay (FDA approved for NASAL specimens in patients 26 years of age and older), is one component of a comprehensive surveillance program. It is not intended to diagnose infection nor to guide or monitor treatment. Performed at [facility], [place] [address] [place],  [address]       Radiology Studies: Dg Knee Complete 4 Views Left  Result Date: 09/10/2017 CLINICAL DATA:  Chronic left knee pain.  Severe pain. EXAM: LEFT KNEE - COMPLETE 4+ VIEW COMPARISON:  None. FINDINGS: There is near complete loss of joint space with bone-on-bone configuration in both the medial and lateral compartments of the knee. A moderate-sized joint effusion is present. Subcutaneous edema is also present about the right knee. IMPRESSION: 1. 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 2. Moderate joint effusion suggesting acute inflammation. 3. Superficial edema is also present. Electronically Signed   By: San Morelle M.D.   On: 09/10/2017 16:01     Medications:  Scheduled: . amLODipine  5 mg Oral Daily  . Chlorhexidine Gluconate Cloth  6 each Topical Daily  . darbepoetin (ARANESP) injection - NON-DIALYSIS  100 mcg Subcutaneous Q Fri-1800  . docusate  sodium  100 mg Oral Daily  . famotidine  20 mg Oral Daily  . heparin  5,000 Units Subcutaneous Q8H  . insulin aspart  1-3 Units Subcutaneous TID AC & HS  . methylPREDNISolone (SOLU-MEDROL) injection  60 mg Intravenous Q24H  . metoprolol tartrate  12.5 mg Oral BID  . pantoprazole  40 mg Oral Daily  . sodium chloride flush  10-40 mL Intracatheter Q12H   Continuous: . sodium  chloride    . sodium chloride Stopped (09/09/17 1800)  . magnesium sulfate 1 - 4 g bolus IVPB     TKW:IOXBDZ chloride, hydrALAZINE, HYDROmorphone (DILAUDID) injection, magnesium sulfate 1 - 4 g bolus IVPB, ondansetron (ZOFRAN) IV, oxyCODONE-acetaminophen, polyethylene glycol, potassium chloride, sodium chloride flush  Assessment/Plan:    Thoracic aorta endovascular leak She has a history of aneurysm repair at [facility] in 2014.  Patient was seen by vascular surgery and cardiothoracic surgery.  She underwent endovascular repair of the aortic arch on 7/25.  Seems to be stable currently.  Acute blood loss anemia She was transfused 2 units of blood.  Hemoglobin has responded appropriately.  Patient was also given darbepoetin by nephrology.  Acute on chronic kidney disease stage IV Creatinine mildly higher compared to her usual baseline.  Nephrology is following.  No labs noted today.  We will order.  Left knee pain and swelling X-ray shows moderate joint effusion.  Patient has significantly limited range of motion of that left knee joint.  She may benefit from arthrocentesis.  Patient has a known history of rheumatoid arthritis.  Will discuss with orthopedics.  It appears the patient was started on systemic steroids yesterday.  History of rheumatoid arthritis Methotrexate on hold.  History of stroke Stable.  Essential hypertension Stable.  Monitor blood pressures closely.  Patient was noted to have episodes of bradycardia overnight.  She is noted to be on metoprolol.  We will discontinue the nighttime dose.  Left arm  swelling Venous Doppler study has been ordered.  DVT Prophylaxis: Subcutaneous heparin    Code Status: Full code Family Communication: Discussed with patient Disposition Plan: Management as outlined above.  Start mobilizing if cleared to do so by surgeons.    LOS: 3 days   [facility] Pager [phone] 09/11/2017, 10:57 AM  If 7PM-7AM, please contact night-coverage at [email or web address], password [facility]

## 2017-09-11 NOTE — Progress Notes (Signed)
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  Would consider adding another RA agent to her current regimen if able and having her follow up with her Rheumatologist once dc'd home. I will follow up on cultures to ensure no bacteria grow.  No indication for surgery at this time.

## 2017-09-11 NOTE — Progress Notes (Signed)
MD notified of pts HR. The pt has HR 40s to 50s with a couple brief drops into the 30s. A&O x 4 asymptomatic. Will continue to monitor.

## 2017-09-11 NOTE — Procedures (Signed)
Indications:  Diana Walters is a 62 year old female with increasing left knee pain and swelling.  She was admitted for cardiac issues related to an old thoracic aneurysm.  During the course of that admission she was noted to have increasing left knee pain with some swelling.  I was consulted for arthrocentesis and fluid analysis.  After discussion of the risk and benefits of the above procedure reviewed with the patient.  She did provide informed consent  Procedure:  The left knee was identified and agreed upon with the patient as the correct side.  The left knee was prepped and draped in standard sterile fashion.  To the superior lateral suprapatellar portal was entered with an 18-gauge needle.  I was able to aspirate 6 cc of blood-tinged synovial fluid.  There was no gross purulence.  Standard sterile bandage was applied.  She did tolerate the procedure well.  There were no immediate complications.    Disposition: She may be weightbearing as tolerated to the left leg.  The fluid has been sent to the microbiology lab for cell count and culture.  We will follow along to assess for septic arthritis versus flareup of her rheumatoid arthritis.

## 2017-09-12 DIAGNOSIS — I10 Essential (primary) hypertension: Secondary | ICD-10-CM

## 2017-09-12 LAB — BASIC METABOLIC PANEL
Anion gap: 11 (ref 5–15)
BUN: 57 mg/dL — ABNORMAL HIGH (ref 8–23)
CO2: 19 mmol/L — ABNORMAL LOW (ref 22–32)
Calcium: 7.9 mg/dL — ABNORMAL LOW (ref 8.9–10.3)
Chloride: 107 mmol/L (ref 98–111)
Creatinine, Ser: 4.4 mg/dL — ABNORMAL HIGH (ref 0.44–1.00)
GFR calc Af Amer: 11 mL/min — ABNORMAL LOW (ref >60)
GFR calc non Af Amer: 10 mL/min — ABNORMAL LOW (ref >60)
Glucose, Bld: 141 mg/dL — ABNORMAL HIGH (ref 70–99)
Potassium: 5.1 mmol/L (ref 3.5–5.1)
Sodium: 137 mmol/L (ref 135–145)

## 2017-09-12 LAB — GLUCOSE, CAPILLARY
GLUCOSE-CAPILLARY: 116 mg/dL — AB (ref 70–99)
Glucose-Capillary: 121 mg/dL — ABNORMAL HIGH (ref 70–99)
Glucose-Capillary: 89 mg/dL (ref 70–99)
Glucose-Capillary: 119 mg/dL — ABNORMAL HIGH (ref 70–99)
Glucose-Capillary: 96 mg/dL (ref 70–99)
Glucose-Capillary: 162 mg/dL — ABNORMAL HIGH (ref 70–99)

## 2017-09-12 LAB — CBC
HCT: 29 % — ABNORMAL LOW (ref 36.0–46.0)
Hemoglobin: 9.2 g/dL — ABNORMAL LOW (ref 12.0–15.0)
MCH: 27.5 pg (ref 26.0–34.0)
MCHC: 31.7 g/dL (ref 30.0–36.0)
MCV: 86.8 fL (ref 78.0–100.0)
Platelets: 255 10*3/uL (ref 150–400)
RBC: 3.34 MIL/uL — ABNORMAL LOW (ref 3.87–5.11)
RDW: 18.2 % — ABNORMAL HIGH (ref 11.5–15.5)
WBC: 7.3 10*3/uL (ref 4.0–10.5)

## 2017-09-12 MED ORDER — SODIUM CHLORIDE 0.45 % IV SOLN: INTRAVENOUS | Status: DC

## 2017-09-12 NOTE — Progress Notes (Signed)
Occupational Therapy Evaluation Patient Details Name: Diana Walters MRN: [id number] DOB: March 12, 1955 Today's Date: 09/12/2017    History of Present Illness 62 y.o. female s/p TEVAR 7/25. Pt also presenting with severe L knee pain. PMH includes: CVA, HTN, Seizure, AKI thoracic aortic aneurysm, RA   Clinical Impression   PTA, pt was living at home alone, was mod independent with ADLs and mod independent with functional mobility with intermittent use of SPC. PTA, pt received intermittent assistance from family when RA flare up in L knee significantly limited pt's ability to participate in functional activities. Pt currently requires minguard for functional mobility at RW level and minguard during ADLs. Due to pt's deficits (see OT problem list), pt would benefit from acute OT to address establish goals to facilitate safe D/C home. At this time, recommend HHOT follow-up.      Follow Up Recommendations  Home health OT;Supervision/Assistance - 24 hour    Equipment Recommendations  Tub/shower seat    Recommendations for Other Services       Precautions / Restrictions Precautions Precautions: Fall Restrictions Weight Bearing Restrictions: No      Mobility Bed Mobility Overal bed mobility: Needs Assistance Bed Mobility: Supine to Sit     Supine to sit: Supervision Sit to supine: Supervision   General bed mobility comments: increased time and effort due to pain  Transfers Overall transfer level: Needs assistance Equipment used: Rolling walker (2 wheeled) Transfers: Sit to/from Stand Sit to Stand: Min guard         General transfer comment: Min guard for safety.     Balance Overall balance assessment: Needs assistance Sitting-balance support: No upper extremity supported Sitting balance-Leahy Scale: Good Sitting balance - Comments: able to bend over while sitting EOB to adjust socks   Standing balance support: Single extremity supported;During functional  activity Standing balance-Leahy Scale: Fair Standing balance comment: reliance on BUE support on RW during ambulation;                           ADL either performed or assessed with clinical judgement   ADL Overall ADL's : Needs assistance/impaired Eating/Feeding: Set up   Grooming: Min guard;Standing   Upper Body Bathing: Min guard;Standing   Lower Body Bathing: Min guard;Sit to/from stand   Upper Body Dressing : Min guard;Standing   Lower Body Dressing: Min guard;Sit to/from stand Lower Body Dressing Details (indicate cue type and reason): pt able to adjust socks bending over while sitting EOB Toilet Transfer: Min guard;RW;Grab bars Toilet Transfer Details (indicate cue type and reason): pt used bed pan upon arrival due to urgency  Toileting- Clothing Manipulation and Hygiene: Min guard;Sit to/from stand Toileting - Clothing Manipulation Details (indicate cue type and reason): pt requested to independently perform hygiene      Functional mobility during ADLs: Min guard;Rolling walker       Vision         Perception     Praxis      Pertinent Vitals/Pain Pain Assessment: 0-10 Pain Score: 3  Pain Location: L knee Pain Descriptors / Indicators: Aching Pain Intervention(s): Limited activity within patient's tolerance;Monitored during session     Hand Dominance Right   Extremity/Trunk Assessment Upper Extremity Assessment Upper Extremity Assessment: Generalized weakness   Lower Extremity Assessment Lower Extremity Assessment: Defer to PT evaluation       Communication Communication Communication: No difficulties   Cognition Arousal/Alertness: Awake/alert Behavior During Therapy: WFL for tasks assessed/performed Overall Cognitive  Status: Within Functional Limits for tasks assessed                                     General Comments  educated pt on strategies for pain management for RA;educated pt on importance of sitting  upright    Exercises     Shoulder Instructions      Home Living Family/patient expects to be discharged to:: Private residence Living Arrangements: Alone Available Help at Discharge: Family;Available PRN/intermittently Type of Home: House Home Access: Stairs to enter [employer] of Steps: 3 Entrance Stairs-Rails: Right Home Layout: One level     Bathroom Shower/Tub: Teacher, early years/pre: Standard Bathroom Accessibility: Yes How Accessible: Accessible via walker Home Equipment: [place] - 2 wheels;Cane - single point;Toilet riser          Prior Functioning/Environment Level of Independence: Independent with assistive device(s)  Gait / Transfers Assistance Needed: intermittent use of AD when RA gets bad, lives alone, I with mobiltiy and ADLs ADL's / Homemaking Assistance Needed: family assists with ADLs when RA flares up;pt was independent with medication management   Comments: pt has tried ice, heat, massage, mobility to decrease pain in L knee due to RA, reports all strategies unsuccessful to reduce pain;pt states she was not taking medication at home for RA;        OT Problem List: Decreased strength;Decreased activity tolerance;Impaired balance (sitting and/or standing);Pain      OT Treatment/Interventions: Self-care/ADL training;Therapeutic exercise;Modalities;Therapeutic activities;Patient/family education;Balance training    OT Goals(Current goals can be found in the care plan section) Acute Rehab OT Goals Patient Stated Goal: to get back home OT Goal Formulation: With patient Time For Goal Achievement: 09/26/17 Potential to Achieve Goals: Good  OT Frequency: Min 2X/week   Barriers to D/C:            Co-evaluation              AM-PAC PT "6 Clicks" Daily Activity     Outcome Measure Help from another person eating meals?: None Help from another person taking care of personal grooming?: A Little Help from another person  toileting, which includes using toliet, bedpan, or urinal?: A Little Help from another person bathing (including washing, rinsing, drying)?: A Little Help from another person to put on and taking off regular upper body clothing?: A Little Help from another person to put on and taking off regular lower body clothing?: A Little 6 Click Score: 19   End of Session Equipment Utilized During Treatment: Gait belt;Rolling walker Nurse Communication: Mobility status  Activity Tolerance: Patient limited by pain Patient left: in chair;with call bell/phone within reach  OT Visit Diagnosis: Unsteadiness on feet (R26.81);Other abnormalities of gait and mobility (R26.89);Muscle weakness (generalized) (M62.81);Pain Pain - Right/Left: Left Pain - part of body: Knee                Time: 9030-0923 OT Time Calculation (min): 33 min Charges:     Dorinda Hill OTS    Dorinda Hill 09/12/2017, 9:38 AM

## 2017-09-12 NOTE — Progress Notes (Addendum)
Progress Note    09/12/2017 7:29 AM 4 Days Post-Op  Subjective:  Says she feels pretty good this morning; wants to know why she is hoarse as she was hoarse before the procedure.  Afebrile HR  50's-70's NSR [id number]'O systolic 756% RA  Vitals:   09/12/17 0407 09/12/17 0725  BP: (!) 143/65 (!) 143/65  Pulse:  63  Resp:  14  Temp: 97.9 F (36.6 C) 98 F (36.7 C)  SpO2:  100%    Physical Exam: Cardiac:  regular Lungs:  Non labored Incisions:  Right groin is soft without hematoma Extremities:  Feet are warm and well perfused; some mild swelling; palpable radial pulses bilaterally Neuro:  In tact  CBC    Component Value Date/Time   WBC 7.3 09/12/2017 0531   RBC 3.34 (L) 09/12/2017 0531   HGB 9.2 (L) 09/12/2017 0531   HGB 10.3 (L) 06/22/2017 1106   HCT 29.0 (L) 09/12/2017 0531   HCT 31.8 (L) 06/22/2017 1106   PLT 255 09/12/2017 0531   PLT 304 06/22/2017 1106   MCV 86.8 09/12/2017 0531   MCV 77 (L) 06/22/2017 1106   MCH 27.5 09/12/2017 0531   MCHC 31.7 09/12/2017 0531   RDW 18.2 (H) 09/12/2017 0531   RDW 21.7 (H) 06/22/2017 1106   LYMPHSABS 1.4 09/07/2017 2009   LYMPHSABS 1.9 01/19/2017 1128   MONOABS 0.8 09/07/2017 2009   EOSABS 0.1 09/07/2017 2009   EOSABS 0.3 01/19/2017 1128   BASOSABS 0.0 09/07/2017 2009   BASOSABS 0.0 01/19/2017 1128    BMET    Component Value Date/Time   NA 137 09/12/2017 0531   NA 141 06/22/2017 1106   K 5.1 09/12/2017 0531   CL 107 09/12/2017 0531   CO2 19 (L) 09/12/2017 0531   GLUCOSE 141 (H) 09/12/2017 0531   BUN 57 (H) 09/12/2017 0531   BUN 31 (H) 06/22/2017 1106   CREATININE 4.40 (H) 09/12/2017 0531   CREATININE 1.10 06/10/2014 0813   CALCIUM 7.9 (L) 09/12/2017 0531   CALCIUM 7.6 (L) 06/09/2017 0230   GFRNONAA 10 (L) 09/12/2017 0531   GFRAA 11 (L) 09/12/2017 0531    INR    Component Value Date/Time   INR 1.09 09/07/2017 2009     Intake/Output Summary (Last 24 hours) at 09/12/2017 0729 Last data filed at  09/12/2017 0410 Gross per 24 hour  Intake 2004.1 ml  Output 700 ml  Net 1304.1 ml     Assessment:  62 y.o. female is s/p:  Procedure Performed: 1.  Percutaneous access and closure of right common femoral artery 2.  Nonselective aortic cannulation with intravascular ultrasound of thoracic aorta 3.  Endovascular repair of aortic arch and descending thoracic aorta with proximal 34 x 200 mm extended into the descending aorta with 34 x 100 mm extension  4 Days Post-Op  Plan: -pt doing well from vascular standpoint; denies any chest or back pain.  -feet are warm and well perfused; radial pulses palpable bilaterally -right groin without hematoma and is soft -renal function worsening since renal last saw pt-probably beneficial for them to see pt as their note was signing off and see as outpatient. -acute blood loss anemia-hgb stable from yesterday, but drifting downward over the past couple of days.  Transfuse per primary team. -DVT prophylaxis:  Sq heparin Synovial fluid results per ortho   Leontine Locket, PA-C [facility] [phone] 09/12/2017 7:29 AM  I have independently interviewed and examined patient and agree with PA assessment and plan above.  Edema in her left arm appears to be focused around the elbow and forearm as the upper arm does not appear edematous and DVT study was negative.  Doubtful any vascular etiology.  She is okay to mobilize.  Retina Bernardy C. Donzetta Matters, MD [facility] [place] Office: [phone] Pager: [phone]

## 2017-09-12 NOTE — Progress Notes (Signed)
OT Note Addendum for Charges    09/12/17 0949  OT Visit Information  Last OT Received On 09/12/17  OT General Charges  $OT Visit 1 Visit  OT Evaluation  $OT Eval Moderate Complexity 1 Mod  OT Treatments  $Self Care/Home Management  8-22 mins   Caisley Baxendale A. Ulice Brilliant, M.S., OTR/L Acute Rehab Department: [phone]

## 2017-09-12 NOTE — Progress Notes (Addendum)
[facility] PROGRESS NOTE  ABEER DESKINS ZYS:[id number] DOB: 1955/02/17 DOA: 09/07/2017  PCP: Renato Shin, MD  Brief History/Interval Summary: 62 y/o F with hx of complex hybrid thoracic aneurysm repair at [facility] (03/31/12), asthma, anemia, RA(reportedlyon methotrexatebut not on med list), smoker, marijuana abuse, HTN, CVA and seizures admitted early 7/25 am with generalized weakness and mid-scapular back pain for three weeks. Work up notable for CT which showed an acute endovascular leak at the level of the aortic arch with acute extravasation of contrast into the native aneurysmal sac and extensive hematoma in the mediastinum around the arch and descending aorta. Hgb down to 5.1 from baseline of 9-10. She was transfused 2 units PRBC's. VVS consulted (Dr. Donzetta Matters) and recommended stabilization prior to surgery. CVTS involved in her care as well. She underwent endovascular repair of the aortic arch. Post op, course complicated for  anemia of blood loss and AKI.  Patient also has developed left knee swelling and pain.  Reason for Visit: Thoracic aorta endovascular leak status post repair.  Consultants: Cardiothoracic surgery.  Vascular surgery.  Nephrology.  Procedures:   Thoracic endovascular stenting for a type III endoleak on 7/25   Left upper extremity venous Doppler Negative for DVT.  Arthrocentesis left knee 7/28  Antibiotics: None  Subjective/Interval History: Patient states that her left knee feels much better.  She underwent arthrocentesis yesterday evening.  She is able to move her left knee much better than yesterday.  Denies any chest pain or shortness of breath.  ROS: Denies any headaches  Objective:  Vital Signs  Vitals:   09/12/17 0407 09/12/17 0725 09/12/17 0800 09/12/17 0957  BP: (!) 143/65 (!) 143/65 (!) 162/71 (!) 162/71  Pulse:  63 63 64  Resp:  14 12   Temp: 97.9 F (36.6 C) 98 F (36.7 C)    TempSrc: Oral Oral    SpO2:  100% 100%   Weight:  68.3 kg (150 lb 9.6 oz)     Height:        Intake/Output Summary (Last 24 hours) at 09/12/2017 1023 Last data filed at 09/12/2017 0900 Gross per 24 hour  Intake 1764.1 ml  Output 900 ml  Net 864.1 ml   Filed Weights   09/10/17 0600 09/11/17 0500 09/12/17 0407  Weight: 68.5 kg (151 lb 0.2 oz) 67.7 kg (149 lb 4 oz) 68.3 kg (150 lb 9.6 oz)    General appearance: Awake alert.  In no distress. Resp:.  No wheezing rales or rhonchi.  Clear to auscultation bilaterally Cardio: S1-S2 is normal regular.  No S3-S4.  No rubs murmurs or bruit GI: Abdomen is soft.  Nontender nondistended.  Bowel sounds are present.  No masses organomegaly Extremities: Improve mobility of the left knee joint is noted.  Swelling is about the same.  But less pain with movement.  Left arm remains swollen as yesterday.   Neurologic: No focal neurological deficits.  Lab Results:  Data Reviewed: I have personally reviewed following labs and imaging studies  CBC: Recent Labs  Lab 09/07/17 2009 09/08/17 0609  09/09/17 0332 09/09/17 1725 09/10/17 0440 09/10/17 1712 09/11/17 0500 09/12/17 0531  WBC 10.1 9.9  --  10.6*  --  9.9  --   --  7.3  NEUTROABS 7.8*  --   --   --   --   --   --   --   --   HGB 5.1* 8.0*   < > 6.7* 9.9* 8.8* 9.9* 9.4* 9.2*  HCT 17.5*  25.6*   < > 21.5* 30.9* 27.6* 31.0* 30.1* 29.0*  MCV 88.4 87.1  --  86.7  --  87.9  --   --  86.8  PLT 539* 385  --  275  --  215  --   --  255   < > = values in this interval not displayed.    Basic Metabolic Panel: Recent Labs  Lab 09/08/17 0609 09/08/17 1453 09/09/17 0332 09/10/17 0440 09/11/17 1200 09/12/17 0531  NA 141 142 141 139 136 137  K 4.8 4.4 4.7 4.6 5.5* 5.1  CL 111 113* 112* 107 109 107  CO2 16*  --  20* 20* 21* 19*  GLUCOSE 68* 82 71 82 122* 141*  BUN 50* 41* 45* 42* 51* 57*  CREATININE 3.02* 2.90* 3.07* 3.28* 3.90* 4.40*  CALCIUM 8.1*  --  8.0* 7.9* 8.0* 7.9*  MG 2.1  --   --   --   --   --   PHOS 5.1*  --   --   --   --   --       GFR: Estimated Creatinine Clearance: 14.1 mL/min (A) (by C-G formula based on SCr of 4.4 mg/dL (H)).  Liver Function Tests: Recent Labs  Lab 09/07/17 2009 09/09/17 0332  AST 19 14*  ALT 12 10  ALKPHOS 57 46  BILITOT 0.5 0.5  PROT 7.9 6.0*  ALBUMIN 2.7* 2.2*    Coagulation Profile: Recent Labs  Lab 09/07/17 2009  INR 1.09    Cardiac Enzymes: Recent Labs  Lab 09/08/17 0133  TROPONINI <0.03    CBG: Recent Labs  Lab 09/11/17 0800 09/11/17 1222 09/11/17 1637 09/11/17 2140 09/12/17 0821  GLUCAP 141* 132* 118* 148* 119*     Recent Results (from the past 240 hour(s))  Surgical pcr screen     Status: None   Collection Time: 09/08/17  4:59 AM  Result Value Ref Range Status   MRSA, PCR NEGATIVE NEGATIVE Final   Staphylococcus aureus NEGATIVE NEGATIVE Final    Comment: (NOTE) The Xpert SA Assay (FDA approved for NASAL specimens in patients 50 years of age and older), is one component of a comprehensive surveillance program. It is not intended to diagnose infection nor to guide or monitor treatment. Performed at [facility], [place] [address] [place], [place] [address]   Body fluid culture     Status: None (Preliminary result)   Collection Time: 09/11/17  6:21 PM  Result Value Ref Range Status   Specimen Description KNEE  Final   Special Requests FLUID  Final   Gram Stain   Final    ABUNDANT WBC PRESENT, PREDOMINANTLY PMN NO ORGANISMS SEEN    Culture   Final    NO GROWTH < 12 HOURS Performed at [facility] [address], [place], [place] [address]    Report Status PENDING  Incomplete      Radiology Studies: Dg Knee Complete 4 Views Left  Result Date: 09/10/2017 CLINICAL DATA:  Chronic left knee pain.  Severe pain. EXAM: LEFT KNEE - COMPLETE 4+ VIEW COMPARISON:  None. FINDINGS: There is near complete loss of joint space with bone-on-bone configuration in both the medial and lateral compartments of the knee. A moderate-sized joint  effusion is present. Subcutaneous edema is also present about the right knee. IMPRESSION: 1. 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 2. Moderate joint effusion suggesting acute inflammation. 3. Superficial edema is also  present. Electronically Signed   By: San Morelle M.D.   On: 09/10/2017 16:01     Medications:  Scheduled: . amLODipine  5 mg Oral Daily  . darbepoetin (ARANESP) injection - NON-DIALYSIS  100 mcg Subcutaneous Q Fri-1800  . docusate sodium  100 mg Oral Daily  . famotidine  20 mg Oral Daily  . heparin  5,000 Units Subcutaneous Q8H  . insulin aspart  1-3 Units Subcutaneous TID AC & HS  . methylPREDNISolone (SOLU-MEDROL) injection  60 mg Intravenous Q24H  . metoprolol tartrate  12.5 mg Oral Daily  . pantoprazole  40 mg Oral Daily  . sodium chloride flush  10-40 mL Intracatheter Q12H   Continuous: . sodium chloride    . magnesium sulfate 1 - 4 g bolus IVPB     HFW:YOVZCH chloride, hydrALAZINE, HYDROmorphone (DILAUDID) injection, magnesium sulfate 1 - 4 g bolus IVPB, ondansetron (ZOFRAN) IV, oxyCODONE-acetaminophen, polyethylene glycol, sodium chloride flush  Assessment/Plan:    Thoracic aorta endovascular leak She has a history of aneurysm repair at [facility] in 2014.  Patient was seen by vascular surgery and cardiothoracic surgery.  She underwent endovascular repair of the aortic arch on 7/25.  Seems to be stable currently.  Acute blood loss anemia She was transfused 2 units of blood.  Hemoglobin responded appropriately.  Hemoglobin is stable at greater than 9.  No indication for transfusion.  Patient was also given darbepoetin by nephrology.  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  Patient's creatinine was as high as 3.8 in April.  He was 3.3 when she was initially hospitalized.  Improved to 2.9 and now has climbed to 0.4.  She appears to be somewhat hypovolemic.  We  will give her IV fluids and recheck labs tomorrow.  Nephrology was following but signed off yesterday.  If there is no improvement in her renal function we will request them to reevaluate patient.  Patient did get contrast during her angiogram on 7/25.  Patient did get Kayexalate yesterday for elevated potassium level.  Potassium level improved this morning.  Left knee joint effusion status post arthrocentesis X-ray showed moderate joint effusion.  Patient had significant limitation in range of motion.  Orthopedics was consulted and patient underwent arthrocentesis on 7/28.  Counts are suggestive of inflammation rather than infection.  Patient does feel much better after fluid was aspirated.  Continue with Solu-Medrol for now.  ESR CRP noted to be elevated.  Patient with known history of rheumatoid arthritis.    History of rheumatoid arthritis Methotrexate on hold.  History of stroke Stable.  Essential hypertension Stable.  Monitor blood pressures closely.  Patient was noted to have episodes of bradycardia overnight on 7/27.  She was noted to be on metoprolol.  Changed to once a day from twice daily.  Heart rate seems to be better.  Continue to monitor.  Left arm swelling No DVT noted on venous Doppler study.  Edema could be the reason.  Unclear if there is any vascular reason for same.  Will discuss with vascular surgery.   DVT Prophylaxis: Subcutaneous heparin    Code Status: Full code Family Communication: Discussed with patient Disposition Plan: Management as outlined above.  Await renal function to stabilize.  PT and OT evaluation.    LOS: 4 days   [facility] Pager [phone] 09/12/2017, 10:23 AM  If 7PM-7AM, please contact night-coverage at [email or web address], password [facility]

## 2017-09-12 NOTE — Progress Notes (Signed)
Physical Therapy Treatment Patient Details Name: Diana Walters MRN: [id number] DOB: 1955/04/17 Today's Date: 09/12/2017    History of Present Illness 62 y.o. female s/p TEVAR 7/25. Pt also presenting with severe L knee pain. PMH includes: CVA, HTN, Seizure, AKI thoracic aortic aneurysm, RA    PT Comments    Pt con't to have bilat LE swelling, L knee the worst. Pt with increased ambulation tolerance this date but remains to be limited by L knee pain. Acute PT to con't to follow.   Follow Up Recommendations  Home health PT;Supervision/Assistance - 24 hour     Equipment Recommendations  Rolling walker with 5" wheels;3in1 (PT)    Recommendations for Other Services       Precautions / Restrictions Precautions Precautions: Fall Restrictions Weight Bearing Restrictions: No    Mobility  Bed Mobility               General bed mobility comments: pt up in chair upon PT arrival  Transfers Overall transfer level: Needs assistance Equipment used: Rolling walker (2 wheeled) Transfers: Sit to/from Stand Sit to Stand: Supervision         General transfer comment: increased time, verbal cues to push up from arm rests and not pull up on walker,   Ambulation/Gait Ambulation/Gait assistance: Supervision Gait Distance (Feet): 120 Feet Assistive device: Rolling walker (2 wheeled) Gait Pattern/deviations: Step-to pattern;Antalgic Gait velocity: slow   General Gait Details: pt with improved cadence initially and then with increased onset of L knee pain limiting further ambulation, 1 standing rest break   Stairs             Wheelchair Mobility    Modified Rankin (Stroke Patients Only)       Balance Overall balance assessment: Needs assistance Sitting-balance support: No upper extremity supported Sitting balance-Leahy Scale: Good     Standing balance support: Single extremity supported Standing balance-Leahy Scale: Fair Standing balance comment: pt placed hand  on walker to balance while performing pericare after tolieting                            Cognition Arousal/Alertness: Awake/alert Behavior During Therapy: WFL for tasks assessed/performed Overall Cognitive Status: Within Functional Limits for tasks assessed                                        Exercises      General Comments General comments (skin integrity, edema, etc.): pt with bilat LE edema, L knee with more swelling, pt assisted to [facility], supervision for hygiene      Pertinent Vitals/Pain Pain Assessment: 0-10 Pain Score: 3 (3 at rest, 9 during amb) Pain Location: L knee Pain Descriptors / Indicators: Aching Pain Intervention(s): Limited activity within patient's tolerance    Home Living                      Prior Function            PT Goals (current goals can now be found in the care plan section) Acute Rehab PT Goals Patient Stated Goal: to get back home Progress towards PT goals: Progressing toward goals    Frequency    Min 3X/week      PT Plan Current plan remains appropriate    Co-evaluation  AM-PAC PT "6 Clicks" Daily Activity  Outcome Measure  Difficulty turning over in bed (including adjusting bedclothes, sheets and blankets)?: A Little Difficulty moving from lying on back to sitting on the side of the bed? : A Little Difficulty sitting down on and standing up from a chair with arms (e.g., wheelchair, bedside commode, etc,.)?: A Little Help needed moving to and from a bed to chair (including a wheelchair)?: A Little Help needed walking in hospital room?: A Little Help needed climbing 3-5 steps with a railing? : A Lot 6 Click Score: 17    End of Session Equipment Utilized During Treatment: Gait belt Activity Tolerance: Patient limited by pain Patient left: in bed;with call bell/phone within reach(sitting EOB for lunch) Nurse Communication: Mobility status PT Visit Diagnosis: Unsteadiness  on feet (R26.81);Pain;Difficulty in walking, not elsewhere classified (R26.2) Pain - Right/Left: Left Pain - part of body: Knee     Time: 1300-1333 PT Time Calculation (min) (ACUTE ONLY): 33 min  Charges:  $Gait Training: 8-22 mins $Therapeutic Activity: 8-22 mins                     Kittie Plater, PT, DPT Pager #: [phone] Office #: [phone]    [place] 09/12/2017, 2:15 PM

## 2017-09-13 LAB — CBC
HEMATOCRIT: 29.4 % — AB (ref 36.0–46.0)
Hemoglobin: 9.3 g/dL — ABNORMAL LOW (ref 12.0–15.0)
MCH: 27.7 pg (ref 26.0–34.0)
MCHC: 31.6 g/dL (ref 30.0–36.0)
MCV: 87.5 fL (ref 78.0–100.0)
PLATELETS: 271 10*3/uL (ref 150–400)
RBC: 3.36 MIL/uL — ABNORMAL LOW (ref 3.87–5.11)
RDW: 18.5 % — AB (ref 11.5–15.5)
WBC: 7.4 10*3/uL (ref 4.0–10.5)

## 2017-09-13 LAB — BASIC METABOLIC PANEL
Anion gap: 9 (ref 5–15)
BUN: 64 mg/dL — AB (ref 8–23)
CALCIUM: 7.6 mg/dL — AB (ref 8.9–10.3)
CO2: 19 mmol/L — AB (ref 22–32)
CREATININE: 4.49 mg/dL — AB (ref 0.44–1.00)
Chloride: 107 mmol/L (ref 98–111)
GFR calc Af Amer: 11 mL/min — ABNORMAL LOW (ref >60)
GFR calc non Af Amer: 10 mL/min — ABNORMAL LOW (ref >60)
GLUCOSE: 129 mg/dL — AB (ref 70–99)
Potassium: 4.9 mmol/L (ref 3.5–5.1)
Sodium: 135 mmol/L (ref 135–145)

## 2017-09-13 LAB — PHOSPHORUS: PHOSPHORUS: 5.9 mg/dL — AB (ref 2.5–4.6)

## 2017-09-13 LAB — GLUCOSE, CAPILLARY
Glucose-Capillary: 115 mg/dL — ABNORMAL HIGH (ref 70–99)
Glucose-Capillary: 110 mg/dL — ABNORMAL HIGH (ref 70–99)
Glucose-Capillary: 112 mg/dL — ABNORMAL HIGH (ref 70–99)
Glucose-Capillary: 122 mg/dL — ABNORMAL HIGH (ref 70–99)

## 2017-09-13 MED ORDER — FUROSEMIDE 10 MG/ML IJ SOLN
60.0000 mg | Freq: Two times a day (BID) | INTRAMUSCULAR | Status: DC
  Administered 2017-09-13 – 2017-09-14 (×2): 60 mg via INTRAVENOUS
  Filled 2017-09-13 (×2): qty 6

## 2017-09-13 MED ORDER — PREDNISONE 20 MG PO TABS
40.0000 mg | ORAL_TABLET | Freq: Every day | ORAL | Status: DC
  Administered 2017-09-13 – 2017-09-15 (×3): 40 mg via ORAL
  Filled 2017-09-13 (×3): qty 2

## 2017-09-13 MED ORDER — POLYETHYLENE GLYCOL 3350 17 G PO PACK
17.0000 g | PACK | Freq: Two times a day (BID) | ORAL | Status: DC
  Administered 2017-09-13 – 2017-09-19 (×10): 17 g via ORAL
  Filled 2017-09-13 (×15): qty 1

## 2017-09-13 NOTE — Progress Notes (Signed)
  Progress Note    09/13/2017 7:28 AM 5 Days Post-Op  Subjective: denies chest pain C/o swelling in bue/ble  Vitals:   09/13/17 0300 09/13/17 0358  BP:  (!) 157/64  Pulse: (!) 57 (!) 58  Resp: 11 10  Temp:  98.1 F (36.7 C)  SpO2: 99% 99%    Physical Exam: aaox3 Abdomen is soft Palpable femoral pulses bilaterally, no hematoma on right Left arm with stable swelling near elbow  CBC    Component Value Date/Time   WBC 7.4 09/13/2017 0320   RBC 3.36 (L) 09/13/2017 0320   HGB 9.3 (L) 09/13/2017 0320   HGB 10.3 (L) 06/22/2017 1106   HCT 29.4 (L) 09/13/2017 0320   HCT 31.8 (L) 06/22/2017 1106   PLT 271 09/13/2017 0320   PLT 304 06/22/2017 1106   MCV 87.5 09/13/2017 0320   MCV 77 (L) 06/22/2017 1106   MCH 27.7 09/13/2017 0320   MCHC 31.6 09/13/2017 0320   RDW 18.5 (H) 09/13/2017 0320   RDW 21.7 (H) 06/22/2017 1106   LYMPHSABS 1.4 09/07/2017 2009   LYMPHSABS 1.9 01/19/2017 1128   MONOABS 0.8 09/07/2017 2009   EOSABS 0.1 09/07/2017 2009   EOSABS 0.3 01/19/2017 1128   BASOSABS 0.0 09/07/2017 2009   BASOSABS 0.0 01/19/2017 1128    BMET    Component Value Date/Time   NA 135 09/13/2017 0320   NA 141 06/22/2017 1106   K 4.9 09/13/2017 0320   CL 107 09/13/2017 0320   CO2 19 (L) 09/13/2017 0320   GLUCOSE 129 (H) 09/13/2017 0320   BUN 64 (H) 09/13/2017 0320   BUN 31 (H) 06/22/2017 1106   CREATININE 4.49 (H) 09/13/2017 0320   CREATININE 1.10 06/10/2014 0813   CALCIUM 7.6 (L) 09/13/2017 0320   CALCIUM 7.6 (L) 06/09/2017 0230   GFRNONAA 10 (L) 09/13/2017 0320   GFRAA 11 (L) 09/13/2017 0320    INR    Component Value Date/Time   INR 1.09 09/07/2017 2009     Intake/Output Summary (Last 24 hours) at 09/13/2017 0728 Last data filed at 09/13/2017 0300 Gross per 24 hour  Intake 1288.75 ml  Output 475 ml  Net 813.75 ml     Assessment:  62 y.o. female is s/p TEVAR for acute appearing endoleak with TAA expansion  Plan: Cr continues to increase Elevate LUE and  BLE for edema oob as tolerates Will need CTA as f/u but may be precluded by renal function   Brandon C. Donzetta Matters, MD [facility] Office: [phone] Pager: [phone]  09/13/2017 7:28 AM

## 2017-09-13 NOTE — Care Management Note (Signed)
Case Management Note  Patient Details  Name: SHARYN BRILLIANT MRN: [id number] Date of Birth: [birth year]  Subjective/Objective:   Pt admitted with CP - s/p AAA repair, now in renal failure  - not yet determined to need HD                 Action/Plan:  PTA Independent from home.  Pts family will provide 24 hour supervision as recommended.  CM provided hardcopy choice list for HH/DME as recommended.  CM will follow back up with pt.   Expected Discharge Date:                  Expected Discharge Plan:  [place]  In-House Referral:     Discharge planning Services  CM Consult  Post Acute Care Choice:    Choice offered to:     DME Arranged:    DME Agency:     HH Arranged:    HH Agency:     Status of Service:     If discussed at [employer], dates discussed:    Additional Comments:  Maryclare Labrador, RN 09/13/2017, 4:03 PM

## 2017-09-13 NOTE — Progress Notes (Signed)
[facility] ROUNDING NOTE   Subjective:   [facility] was following and signed off 7/27 but due to worsening renal function we are asked to see her in follow up.  Briefly: [place] Stanger is a 62 yo F with a PMHx notable for hybrid thoracic aneurysm repair at [facility] 2014, HFpEF LVEF 50-55% G2DD, asthma, osteoarthritis on MTX, tobacco/marjuana/EtOH use, HTN, HLD, seizure disorder, prior CVA, depression and chronic anemia who was admitted with chest pain found to have endoleak of prior AAA repair.  She underwent repair (endovascular) 7/25.  There was contrast used as part of the repair.  MAPs remained >/= 60 during the surgery per anesthesia report.   Over the admission she is net + 9L per I/Os and her weight is up from 59kg on 7/23 to 70.1kg 7/30.  UOP in the past few days ranges 200 --> 700 --> 475 with no diuretics. Cr on admission 3.33 (with baseline more in the 3.5+ range), 7/25 was 2.9 and now 4.5, 4.4 yesterday.    She c/o knee pain and generalized edema.  She's been started on prednisone for L knee pain and edema.  Looks like knee was aspirated but I'm not sure if any fluid was sent for analysis.      Objective:  Vital signs in last 24 hours:  Temp:  [97.4 F (36.3 C)-98.3 F (36.8 C)] 97.7 F (36.5 C) (07/30 0739) Pulse Rate:  [55-68] 62 (07/30 0800) Resp:  [9-14] 9 (07/30 0739) BP: (143-171)/(61-117) 170/66 (07/30 0800) SpO2:  [98 %-100 %] 99 % (07/30 0739) Weight:  [70.1 kg (154 lb 9.6 oz)] 70.1 kg (154 lb 9.6 oz) (07/30 0358)  Weight change: 1.814 kg (4 lb) Filed Weights   09/11/17 0500 09/12/17 0407 09/13/17 0358  Weight: 67.7 kg (149 lb 4 oz) 68.3 kg (150 lb 9.6 oz) 70.1 kg (154 lb 9.6 oz)   Intake/Output: I/O last 3 completed shifts: In: 2212.9 [P.O.:480; I.V.:1732.9] Out: 875 [Urine:875]   Intake/Output this shift:  Total I/O In: 250 [P.O.:240; I.V.:10] Out: 200 [Urine:200]  Physical Exam: General: A/O x4, in no acute distress, afebrile,  nondiaphoretic CVS- RRR, Grade 4 Sys murmur RS- CTA bilaterally  ABD- BS present soft non-distended EXT- R knee swollen, 2+ generalized edema    Basic Metabolic Panel: Recent Labs  Lab 09/08/17 0609  09/09/17 0332 09/10/17 0440 09/11/17 1200 09/12/17 0531 09/13/17 0320  NA 141   < > 141 139 136 137 135  K 4.8   < > 4.7 4.6 5.5* 5.1 4.9  CL 111   < > 112* 107 109 107 107  CO2 16*  --  20* 20* 21* 19* 19*  GLUCOSE 68*   < > 71 82 122* 141* 129*  BUN 50*   < > 45* 42* 51* 57* 64*  CREATININE 3.02*   < > 3.07* 3.28* 3.90* 4.40* 4.49*  CALCIUM 8.1*  --  8.0* 7.9* 8.0* 7.9* 7.6*  MG 2.1  --   --   --   --   --   --   PHOS 5.1*  --   --   --   --   --   --    < > = values in this interval not displayed.    Liver Function Tests: Recent Labs  Lab 09/07/17 2009 09/09/17 0332  AST 19 14*  ALT 12 10  ALKPHOS 57 46  BILITOT 0.5 0.5  PROT 7.9 6.0*  ALBUMIN 2.7* 2.2*   No results for  input(s): LIPASE, AMYLASE in the last 168 hours. No results for input(s): AMMONIA in the last 168 hours.  CBC: Recent Labs  Lab 09/07/17 2009 09/08/17 0609  09/09/17 0332  09/10/17 0440 09/10/17 1712 09/11/17 0500 09/12/17 0531 09/13/17 0320  WBC 10.1 9.9  --  10.6*  --  9.9  --   --  7.3 7.4  NEUTROABS 7.8*  --   --   --   --   --   --   --   --   --   HGB 5.1* 8.0*   < > 6.7*   < > 8.8* 9.9* 9.4* 9.2* 9.3*  HCT 17.5* 25.6*   < > 21.5*   < > 27.6* 31.0* 30.1* 29.0* 29.4*  MCV 88.4 87.1  --  86.7  --  87.9  --   --  86.8 87.5  PLT 539* 385  --  275  --  215  --   --  255 271   < > = values in this interval not displayed.    Cardiac Enzymes: Recent Labs  Lab 09/08/17 0133  TROPONINI <0.03    BNP: Invalid input(s): POCBNP  CBG: Recent Labs  Lab 09/12/17 0821 09/12/17 1159 09/12/17 1703 09/12/17 2132 09/13/17 0753  GLUCAP 119* 96 116* 162* 115*    Microbiology: Results for orders placed or performed during the hospital encounter of 09/07/17  Surgical pcr screen      Status: None   Collection Time: 09/08/17  4:59 AM  Result Value Ref Range Status   MRSA, PCR NEGATIVE NEGATIVE Final   Staphylococcus aureus NEGATIVE NEGATIVE Final    Comment: (NOTE) The Xpert SA Assay (FDA approved for NASAL specimens in patients 64 years of age and older), is one component of a comprehensive surveillance program. It is not intended to diagnose infection nor to guide or monitor treatment. Performed at [facility], [place] [address] [place], [place] [address]   Body fluid culture     Status: None (Preliminary result)   Collection Time: 09/11/17  6:21 PM  Result Value Ref Range Status   Specimen Description KNEE  Final   Special Requests FLUID  Final   Gram Stain   Final    ABUNDANT WBC PRESENT, PREDOMINANTLY PMN NO ORGANISMS SEEN    Culture   Final    NO GROWTH 2 DAYS Performed at [facility] [address], [place], [place] [address]    Report Status PENDING  Incomplete    Coagulation Studies: No results for input(s): LABPROT, INR in the last 72 hours.  Urinalysis: No results for input(s): COLORURINE, LABSPEC, PHURINE, GLUCOSEU, HGBUR, BILIRUBINUR, KETONESUR, PROTEINUR, UROBILINOGEN, NITRITE, LEUKOCYTESUR in the last 72 hours.  Invalid input(s): APPERANCEUR    Imaging: No results found.   Medications:   . sodium chloride    . magnesium sulfate 1 - 4 g bolus IVPB     . amLODipine  5 mg Oral Daily  . darbepoetin (ARANESP) injection - NON-DIALYSIS  100 mcg Subcutaneous Q Fri-1800  . docusate sodium  100 mg Oral Daily  . famotidine  20 mg Oral Daily  . heparin  5,000 Units Subcutaneous Q8H  . insulin aspart  1-3 Units Subcutaneous TID AC & HS  . methylPREDNISolone (SOLU-MEDROL) injection  60 mg Intravenous Q24H  . metoprolol tartrate  12.5 mg Oral Daily  . pantoprazole  40 mg Oral Daily  . polyethylene glycol  17 g Oral BID  . sodium chloride flush  10-40 mL Intracatheter Q12H   sodium chloride, hydrALAZINE, HYDROmorphone  (DILAUDID) injection, magnesium sulfate 1 - 4 g bolus IVPB, ondansetron (ZOFRAN) IV, oxyCODONE-acetaminophen, sodium chloride flush    Assessment/ Plan:  1. Renal - CKD stage V - Renal function a bit worse than baseline in setting of contrast, recent surgery.  She has no current indications for dialysis but is volume overloaded.  Sodium and fluid restriction.  Start lasix 60 IV BID, 1st dose now.  Hopefully barring further insults renal function will stabilize to improve.  Add on phosphorus. 2. Hypertension/volume  - amlodipine 5mg , metoprolol 12.5 daily.  Volume up so diuresis will help.  Defer med titration to primary but happy to help if staying high.   3. Thoracic aortic aneurysm and perforation - As per vascular surgery, see above. 4.  Anemia, normocytic, acute on chronic - Multifactorial with AoCKD and acute blood loss contributing this admission.  Darbopoeitin 100 given 7/27, redose 8/2.  7/30 Hb 9.3.  Iron sat 4% on 09/07/17.  Has rec'd 2u pRBC which will help with iron deficiency.    Will continue to follow closely.  Page with any questions or concerns. Jannifer Hick MD   LOS: 5

## 2017-09-13 NOTE — Plan of Care (Signed)
  Problem: Activity: Goal: Risk for activity intolerance will decrease Outcome: Progressing   Problem: Pain Managment: Goal: General experience of comfort will improve Outcome: Progressing   

## 2017-09-13 NOTE — Progress Notes (Signed)
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 Work up notable for CT which showed an acute endovascular leak at the level of the aortic arch with acute extravasation of contrast into the native aneurysmal sac and extensive hematoma in the mediastinum around the arch and descending aorta. Hgb down to 5.1 from baseline of 9-10. She was transfused 2 units PRBC's. VVS consulted (Dr. Donzetta Matters) and recommended stabilization prior to surgery. CVTS involved in her care as well. She underwent endovascular repair of the aortic arch. Post op, course complicated for  anemia of blood loss and AKI.  Patient also has developed left knee swelling and pain.  Reason for Visit: Thoracic aorta endovascular leak status post repair.  Consultants: Cardiothoracic surgery.  Vascular surgery.  Nephrology.  Procedures:   Thoracic endovascular stenting for a type III endoleak on 7/25   Left upper extremity venous Doppler Negative for DVT.  Arthrocentesis left knee 7/28  Antibiotics: None  Subjective/Interval History: Patient complains of being swollen all over.  Left knee does feel better but she is concerned about the swelling she has in both legs now.  Denies any shortness of breath.    ROS: Denies any headaches  Objective:  Vital Signs  Vitals:   09/13/17 0300 09/13/17 0358 09/13/17 0739 09/13/17 0800  BP:  (!) 157/64 (!) 171/61 (!) 170/66  Pulse: (!) 57 (!) 58 (!) 55 62  Resp: 11 10 (!) 9   Temp:  98.1 F (36.7 C) 97.7 F (36.5 C)   TempSrc:  Oral Oral   SpO2: 99% 99% 99%   Weight:  70.1 kg (154 lb 9.6 oz)      Height:        Intake/Output Summary (Last 24 hours) at 09/13/2017 0959 Last data filed at 09/13/2017 0930 Gross per 24 hour  Intake 1298.75 ml  Output 375 ml  Net 923.75 ml   Filed Weights   09/11/17 0500 09/12/17 0407 09/13/17 0358  Weight: 67.7 kg (149 lb 4 oz) 68.3 kg (150 lb 9.6 oz) 70.1 kg (154 lb 9.6 oz)    General appearance: Awake alert.  In no distress Resp: Normal effort.  Diminished air entry at the bases but no crackles wheezing or rhonchi. Cardio: S1-S2 is normal regular.  No S3-S4.  No rubs murmurs or bruit. GI: Abdomen is soft.  Nontender nondistended.  Bowel sounds are present.  No masses organomegaly Extremities: Improve mobility of the left knee joint.  Both the lower extremity show pedal edema.  There is also edema of the left upper extremity which is same as yesterday.   Neurologic: No focal neurological deficits.  Lab Results:  Data Reviewed: I have personally reviewed following labs and imaging studies  CBC: Recent Labs  Lab 09/07/17 2009 09/08/17 0609  09/09/17 0332  09/10/17 0440 09/10/17 1712 09/11/17 0500 09/12/17 0531 09/13/17 0320  WBC 10.1 9.9  --  10.6*  --  9.9  --   --  7.3 7.4  NEUTROABS 7.8*  --   --   --   --   --   --   --   --   --   HGB 5.1* 8.0*   < >  6.7*   < > 8.8* 9.9* 9.4* 9.2* 9.3*  HCT 17.5* 25.6*   < > 21.5*   < > 27.6* 31.0* 30.1* 29.0* 29.4*  MCV 88.4 87.1  --  86.7  --  87.9  --   --  86.8 87.5  PLT 539* 385  --  275  --  215  --   --  255 271   < > = values in this interval not displayed.    Basic Metabolic Panel: Recent Labs  Lab 09/08/17 0609  09/09/17 0332 09/10/17 0440 09/11/17 1200 09/12/17 0531 09/13/17 0320  NA 141   < > 141 139 136 137 135  K 4.8   < > 4.7 4.6 5.5* 5.1 4.9  CL 111   < > 112* 107 109 107 107  CO2 16*  --  20* 20* 21* 19* 19*  GLUCOSE 68*   < > 71 82 122* 141* 129*  BUN 50*   < > 45* 42* 51* 57* 64*  CREATININE 3.02*   < > 3.07* 3.28* 3.90* 4.40* 4.49*  CALCIUM 8.1*  --  8.0* 7.9*  8.0* 7.9* 7.6*  MG 2.1  --   --   --   --   --   --   PHOS 5.1*  --   --   --   --   --   --    < > = values in this interval not displayed.    GFR: Estimated Creatinine Clearance: 13.9 mL/min (A) (by C-G formula based on SCr of 4.49 mg/dL (H)).  Liver Function Tests: Recent Labs  Lab 09/07/17 2009 09/09/17 0332  AST 19 14*  ALT 12 10  ALKPHOS 57 46  BILITOT 0.5 0.5  PROT 7.9 6.0*  ALBUMIN 2.7* 2.2*    Coagulation Profile: Recent Labs  Lab 09/07/17 2009  INR 1.09    Cardiac Enzymes: Recent Labs  Lab 09/08/17 0133  TROPONINI <0.03    CBG: Recent Labs  Lab 09/12/17 0821 09/12/17 1159 09/12/17 1703 09/12/17 2132 09/13/17 0753  GLUCAP 119* 96 116* 162* 115*     Recent Results (from the past 240 hour(s))  Surgical pcr screen     Status: None   Collection Time: 09/08/17  4:59 AM  Result Value Ref Range Status   MRSA, PCR NEGATIVE NEGATIVE Final   Staphylococcus aureus NEGATIVE NEGATIVE Final    Comment: (NOTE) The Xpert SA Assay (FDA approved for NASAL specimens in patients 64 years of age and older), is one component of a comprehensive surveillance program. It is not intended to diagnose infection nor to guide or monitor treatment. Performed at [facility], [place] [address] [place], [place] [address]   Body fluid culture     Status: None (Preliminary result)   Collection Time: 09/11/17  6:21 PM  Result Value Ref Range Status   Specimen Description KNEE  Final   Special Requests FLUID  Final   Gram Stain   Final    ABUNDANT WBC PRESENT, PREDOMINANTLY PMN NO ORGANISMS SEEN    Culture   Final    NO GROWTH 2 DAYS Performed at [facility] [address], [place], [place] [address]    Report Status PENDING  Incomplete      Radiology Studies: No results found.   Medications:  Scheduled: . amLODipine  5 mg Oral Daily  . darbepoetin (ARANESP) injection - NON-DIALYSIS  100 mcg Subcutaneous Q Fri-1800  . docusate sodium  100 mg  Oral  Daily  . famotidine  20 mg Oral Daily  . heparin  5,000 Units Subcutaneous Q8H  . insulin aspart  1-3 Units Subcutaneous TID AC & HS  . methylPREDNISolone (SOLU-MEDROL) injection  60 mg Intravenous Q24H  . metoprolol tartrate  12.5 mg Oral Daily  . pantoprazole  40 mg Oral Daily  . polyethylene glycol  17 g Oral BID  . sodium chloride flush  10-40 mL Intracatheter Q12H   Continuous: . sodium chloride    . magnesium sulfate 1 - 4 g bolus IVPB     XMI:WOEHOZ chloride, hydrALAZINE, HYDROmorphone (DILAUDID) injection, magnesium sulfate 1 - 4 g bolus IVPB, ondansetron (ZOFRAN) IV, oxyCODONE-acetaminophen, sodium chloride flush  Assessment/Plan:    Thoracic aorta endovascular leak She has a history of aneurysm repair at [facility] in 2014.  Patient was seen by vascular surgery and cardiothoracic surgery.  She underwent endovascular repair of the aortic arch on 7/25.  Seems to be stable currently.  Acute blood loss anemia She was transfused 2 units of blood.  Hemoglobin responded appropriately.  Hemoglobin is stable at greater than 9.  No indication for transfusion.  Patient was also given darbepoetin by nephrology.  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  Patient's creatinine was as high as 3.8 in April.  He was 3.3 when she was initially hospitalized.  Improved to 2.9 and now has climbed to 0.4.  She was given IV fluids yesterday but appears to have developed edema.  She has also gained weight.  Based on outpatient records her weight was 60.7 kg in June.  She is currently weighing 70.1 kg. Has not really put out much urine.  Will stop her IV fluids.  She likely needs high-dose diuretics.  She will need to be seen by nephrology again.  Discussed with on-call nephrology, Dr. Johnney Ou.  Potassium level has improved with Kayexalate.  Patient did get contrast during her angiogram on 7/25.    Left knee joint effusion status post arthrocentesis X-ray showed  moderate joint effusion.  Patient had significant limitation in range of motion.  Orthopedics was consulted and patient underwent arthrocentesis on 7/28.  Counts are suggestive of inflammation rather than infection.  Patient does feel much better after fluid was aspirated.  Patient was placed on systemic steroids.  We will change her to prednisone.  ESR CRP noted to be elevated.  Patient with known history of rheumatoid arthritis.    History of rheumatoid arthritis Methotrexate on hold.  History of stroke Stable.  Essential hypertension Stable.  Monitor blood pressures closely.  Patient was noted to have episodes of bradycardia overnight on 7/27.  She was noted to be on metoprolol.  Changed to once a day from twice daily.  Heart rate seems to be better.  Continue to monitor.  Left arm swelling No DVT noted on venous Doppler study.  Edema could be the reason.  Neurosurgery does not think that there is any vascular reason for her edema.  Could be related to renal failure.    DVT Prophylaxis: Subcutaneous heparin    Code Status: Full code Family Communication: Discussed with patient Disposition Plan: Nephrology to reevaluate.  PT and OT evaluation.    LOS: 5 days   [facility] Pager [phone] 09/13/2017, 9:59 AM  If 7PM-7AM, please contact night-coverage at [email or web address], password [facility]

## 2017-09-14 ENCOUNTER — Encounter (HOSPITAL_COMMUNITY): Payer: Self-pay | Admitting: Vascular Surgery

## 2017-09-14 LAB — CBC
HEMATOCRIT: 31 % — AB (ref 36.0–46.0)
HEMOGLOBIN: 9.8 g/dL — AB (ref 12.0–15.0)
MCH: 27.3 pg (ref 26.0–34.0)
MCHC: 31.6 g/dL (ref 30.0–36.0)
MCV: 86.4 fL (ref 78.0–100.0)
Platelets: 285 10*3/uL (ref 150–400)
RBC: 3.59 MIL/uL — ABNORMAL LOW (ref 3.87–5.11)
RDW: 18.7 % — AB (ref 11.5–15.5)
WBC: 8.9 10*3/uL (ref 4.0–10.5)

## 2017-09-14 LAB — GLUCOSE, CAPILLARY
GLUCOSE-CAPILLARY: 93 mg/dL (ref 70–99)
GLUCOSE-CAPILLARY: 114 mg/dL — AB (ref 70–99)
GLUCOSE-CAPILLARY: 160 mg/dL — AB (ref 70–99)
Glucose-Capillary: 128 mg/dL — ABNORMAL HIGH (ref 70–99)
Glucose-Capillary: 182 mg/dL — ABNORMAL HIGH (ref 70–99)

## 2017-09-14 LAB — BASIC METABOLIC PANEL
ANION GAP: 11 (ref 5–15)
BUN: 72 mg/dL — ABNORMAL HIGH (ref 8–23)
CALCIUM: 7.7 mg/dL — AB (ref 8.9–10.3)
CHLORIDE: 107 mmol/L (ref 98–111)
CO2: 18 mmol/L — AB (ref 22–32)
Creatinine, Ser: 4.21 mg/dL — ABNORMAL HIGH (ref 0.44–1.00)
GFR calc Af Amer: 12 mL/min — ABNORMAL LOW (ref >60)
GFR calc non Af Amer: 10 mL/min — ABNORMAL LOW (ref >60)
GLUCOSE: 142 mg/dL — AB (ref 70–99)
Potassium: 4.8 mmol/L (ref 3.5–5.1)
Sodium: 136 mmol/L (ref 135–145)

## 2017-09-14 MED ORDER — BISACODYL 5 MG PO TBEC
10.0000 mg | DELAYED_RELEASE_TABLET | Freq: Once | ORAL | Status: AC
  Administered 2017-09-14: 10 mg via ORAL
  Filled 2017-09-14: qty 2

## 2017-09-14 MED ORDER — SODIUM CHLORIDE 0.9 % IV SOLN
125.0000 mg | Freq: Every day | INTRAVENOUS | Status: AC
  Administered 2017-09-14 – 2017-09-17 (×4): 125 mg via INTRAVENOUS
  Filled 2017-09-14 (×7): qty 10

## 2017-09-14 MED ORDER — INSULIN ASPART 100 UNIT/ML ~~LOC~~ SOLN
0.0000 [IU] | Freq: Three times a day (TID) | SUBCUTANEOUS | Status: DC
  Administered 2017-09-14: 2 [IU] via SUBCUTANEOUS
  Administered 2017-09-15: 1 [IU] via SUBCUTANEOUS
  Administered 2017-09-16: 2 [IU] via SUBCUTANEOUS
  Administered 2017-09-17: 1 [IU] via SUBCUTANEOUS
  Administered 2017-09-17: 3 [IU] via SUBCUTANEOUS
  Administered 2017-09-18 – 2017-09-19 (×2): 1 [IU] via SUBCUTANEOUS
  Administered 2017-09-20: 2 [IU] via SUBCUTANEOUS

## 2017-09-14 MED ORDER — SODIUM CHLORIDE 0.9 % IV SOLN
125.0000 mg | Freq: Every day | INTRAVENOUS | Status: DC
  Filled 2017-09-14: qty 10

## 2017-09-14 MED ORDER — METOLAZONE 5 MG PO TABS
5.0000 mg | ORAL_TABLET | Freq: Once | ORAL | Status: AC
  Administered 2017-09-14: 5 mg via ORAL
  Filled 2017-09-14: qty 1

## 2017-09-14 MED ORDER — FUROSEMIDE 10 MG/ML IJ SOLN
120.0000 mg | Freq: Two times a day (BID) | INTRAVENOUS | Status: DC
  Administered 2017-09-14 – 2017-09-17 (×8): 120 mg via INTRAVENOUS
  Filled 2017-09-14: qty 12
  Filled 2017-09-14: qty 10
  Filled 2017-09-14 (×2): qty 12
  Filled 2017-09-14 (×2): qty 10
  Filled 2017-09-14 (×2): qty 12
  Filled 2017-09-14: qty 10

## 2017-09-14 NOTE — Progress Notes (Signed)
[facility] ROUNDING NOTE   Subjective:   Reconsulted yesterday for worsening AKI and hypervolemia.   Started lasix 80 IV BID.  No major diuresis ensued though she was slightly net negative.  This morning she requests Foley catheter as it's very difficult for her to get up to urinate even at a BSC in light of her L knee effusion/pain.      Objective:  Vital signs in last 24 hours:  Temp:  [97.6 F (36.4 C)-98.2 F (36.8 C)] 97.6 F (36.4 C) (07/31 0743) Pulse Rate:  [54-84] 59 (07/31 0743) Resp:  [10-21] 12 (07/31 0743) BP: (146-175)/(62-109) 161/74 (07/31 0743) SpO2:  [98 %-100 %] 100 % (07/31 0743) Weight:  [72.1 kg (158 lb 15.2 oz)] 72.1 kg (158 lb 15.2 oz) (07/31 0743)  Weight change: 1.974 kg (4 lb 5.6 oz) Filed Weights   09/13/17 0358 09/14/17 0350 09/14/17 0743  Weight: 70.1 kg (154 lb 9.6 oz) 72.1 kg (158 lb 15.2 oz) 72.1 kg (158 lb 15.2 oz)   Intake/Output: I/O last 3 completed shifts: In: 1538.8 [P.O.:480; I.V.:1058.8] Out: 825 [Urine:825]   Intake/Output this shift:  No intake/output data recorded.  Physical Exam: General: comfortably lying in bed at 45 degrees CVS- RRR, Grade 4 Sys murmur RS- CTA bilaterally  ABD- BS present soft non-distended EXT- L knee swollen, 1+ generalized edema, LUE 2+  Basic Metabolic Panel: Recent Labs  Lab 09/08/17 0609  09/10/17 0440 09/11/17 1200 09/12/17 0531 09/13/17 0320 09/14/17 0315  NA 141   < > 139 136 137 135 136  K 4.8   < > 4.6 5.5* 5.1 4.9 4.8  CL 111   < > 107 109 107 107 107  CO2 16*   < > 20* 21* 19* 19* 18*  GLUCOSE 68*   < > 82 122* 141* 129* 142*  BUN 50*   < > 42* 51* 57* 64* 72*  CREATININE 3.02*   < > 3.28* 3.90* 4.40* 4.49* 4.21*  CALCIUM 8.1*   < > 7.9* 8.0* 7.9* 7.6* 7.7*  MG 2.1  --   --   --   --   --   --   PHOS 5.1*  --   --   --   --  5.9*  --    < > = values in this interval not displayed.    Liver Function Tests: Recent Labs  Lab 09/07/17 2009 09/09/17 0332  AST  19 14*  ALT 12 10  ALKPHOS 57 46  BILITOT 0.5 0.5  PROT 7.9 6.0*  ALBUMIN 2.7* 2.2*   No results for input(s): LIPASE, AMYLASE in the last 168 hours. No results for input(s): AMMONIA in the last 168 hours.  CBC: Recent Labs  Lab 09/07/17 2009  09/09/17 0332  09/10/17 0440 09/10/17 1712 09/11/17 0500 09/12/17 0531 09/13/17 0320 09/14/17 0315  WBC 10.1   < > 10.6*  --  9.9  --   --  7.3 7.4 8.9  NEUTROABS 7.8*  --   --   --   --   --   --   --   --   --   HGB 5.1*   < > 6.7*   < > 8.8* 9.9* 9.4* 9.2* 9.3* 9.8*  HCT 17.5*   < > 21.5*   < > 27.6* 31.0* 30.1* 29.0* 29.4* 31.0*  MCV 88.4   < > 86.7  --  87.9  --   --  86.8 87.5 86.4  PLT 539*   < >  275  --  215  --   --  255 271 285   < > = values in this interval not displayed.    Cardiac Enzymes: Recent Labs  Lab 09/08/17 0133  TROPONINI <0.03    BNP: Invalid input(s): POCBNP  CBG: Recent Labs  Lab 09/13/17 1208 09/13/17 1734 09/13/17 2120 09/14/17 0618 09/14/17 0748  GLUCAP 110* 112* 122* 128* 93    Microbiology: Results for orders placed or performed during the hospital encounter of 09/07/17  Surgical pcr screen     Status: None   Collection Time: 09/08/17  4:59 AM  Result Value Ref Range Status   MRSA, PCR NEGATIVE NEGATIVE Final   Staphylococcus aureus NEGATIVE NEGATIVE Final    Comment: (NOTE) The Xpert SA Assay (FDA approved for NASAL specimens in patients 16 years of age and older), is one component of a comprehensive surveillance program. It is not intended to diagnose infection nor to guide or monitor treatment. Performed at [facility], [place] [address] [place], [place] [address]   Body fluid culture     Status: None (Preliminary result)   Collection Time: 09/11/17  6:21 PM  Result Value Ref Range Status   Specimen Description KNEE  Final   Special Requests FLUID  Final   Gram Stain   Final    ABUNDANT WBC PRESENT, PREDOMINANTLY PMN NO ORGANISMS SEEN    Culture   Final    NO  GROWTH 2 DAYS Performed at [facility] [address] [place], [place] [address]    Report Status PENDING  Incomplete    Coagulation Studies: No results for input(s): LABPROT, INR in the last 72 hours.  Urinalysis: No results for input(s): COLORURINE, LABSPEC, PHURINE, GLUCOSEU, HGBUR, BILIRUBINUR, KETONESUR, PROTEINUR, UROBILINOGEN, NITRITE, LEUKOCYTESUR in the last 72 hours.  Invalid input(s): APPERANCEUR    Imaging: No results found.   Medications:   . sodium chloride    . magnesium sulfate 1 - 4 g bolus IVPB     . amLODipine  5 mg Oral Daily  . darbepoetin (ARANESP) injection - NON-DIALYSIS  100 mcg Subcutaneous Q Fri-1800  . docusate sodium  100 mg Oral Daily  . famotidine  20 mg Oral Daily  . furosemide  60 mg Intravenous Q12H  . heparin  5,000 Units Subcutaneous Q8H  . insulin aspart  1-3 Units Subcutaneous TID AC & HS  . metoprolol tartrate  12.5 mg Oral Daily  . pantoprazole  40 mg Oral Daily  . polyethylene glycol  17 g Oral BID  . predniSONE  40 mg Oral Q breakfast  . sodium chloride flush  10-40 mL Intracatheter Q12H   sodium chloride, hydrALAZINE, HYDROmorphone (DILAUDID) injection, magnesium sulfate 1 - 4 g bolus IVPB, ondansetron (ZOFRAN) IV, oxyCODONE-acetaminophen, sodium chloride flush    Assessment/ Plan:  1. Renal - CKD stage V - Renal function a bit worse than baseline in setting of contrast, recent surgery.  She has no current indications for dialysis but is volume overloaded.  Sodium and fluid restriction.  Increased lasix to 120 IV BID, 1st dose now.  I'll follow up this afternoon and if not diuresing yet will increase and add metolazone.  Hopefully barring further insults renal function will stabilize to improve.  Phos 5.9 yesterday, CTM; check again tomorrow. 2. Hypertension/volume  - amlodipine 5mg , metoprolol 12.5 daily.  Volume up so diuresis will help.  Defer med titration to primary but happy to help if staying high.   3.  Anemia,  normocytic, acute on chronic - Multifactorial with AoCKD and acute blood loss contributing this admission.  Darbopoeitin 100 given 7/27, redose 8/2.  7/31 Hb 9.8.  Iron sat 4% on 09/07/17.  Has rec'd 2u pRBC which will help with iron deficiency.  Give nulecit 125mg  IV x 4 doses while here; ordered 09/14/17.  Will continue to follow closely.  Page with any questions or concerns. Jannifer Hick MD   LOS: 6

## 2017-09-14 NOTE — Plan of Care (Signed)
  Problem: Education: Goal: Knowledge of General Education information will improve Description Including pain rating scale, medication(s)/side effects and non-pharmacologic comfort measures Outcome: Progressing   Problem: Activity: Goal: Risk for activity intolerance will decrease Outcome: Progressing   Problem: Nutrition: Goal: Adequate nutrition will be maintained Outcome: Progressing   Problem: Elimination: Goal: Will not experience complications related to urinary retention Outcome: Progressing   Problem: Safety: Goal: Ability to remain free from injury will improve Outcome: Progressing   Problem: Elimination: Goal: Will not experience complications related to bowel motility Outcome: Not Progressing

## 2017-09-14 NOTE — Progress Notes (Signed)
Physical Therapy Treatment Patient Details Name: Diana Walters MRN: [id number] DOB: [birth year] Today's Date: 09/14/2017    History of Present Illness 62 y.o. female s/p TEVAR 7/25. Pt also presenting with severe L knee pain. PMH includes: CVA, HTN, Seizure, AKI thoracic aortic aneurysm, RA    PT Comments    Patient with mild progression with therapy this visit. Pt reports knee pain is improving. Ambulated 150' with RW on RA, SpO2 WNL, HR 80 resting, 161max this visit with activity. Pt also states she will have family at home to help supervise and assist if needed, she is ambulating with supervision level. Next PT visit to focus on stair training for home entry.       Follow Up Recommendations  Home health PT;Supervision/Assistance - 24 hour     Equipment Recommendations  Rolling walker with 5" wheels;3in1 (PT)    Recommendations for Other Services       Precautions / Restrictions Precautions Precautions: Fall Restrictions Weight Bearing Restrictions: No    Mobility  Bed Mobility Overal bed mobility: Needs Assistance Bed Mobility: Supine to Sit     Supine to sit: Supervision Sit to supine: Supervision      Transfers Overall transfer level: Needs assistance Equipment used: Rolling walker (2 wheeled) Transfers: Sit to/from Stand Sit to Stand: Supervision            Ambulation/Gait Ambulation/Gait assistance: Supervision Gait Distance (Feet): 150 Feet Assistive device: Rolling walker (2 wheeled) Gait Pattern/deviations: Step-to pattern;Antalgic Gait velocity: decreased   General Gait Details: Pt ambulating slowly but with less pain than prior visits. HRmax 115, SpO2 WNL on RA, no over balance issues with use of RW, DOE 3/4   Stairs             Wheelchair Mobility    Modified Rankin (Stroke Patients Only)       Balance Overall balance assessment: Needs assistance Sitting-balance support: No upper extremity supported Sitting balance-Leahy  Scale: Good     Standing balance support: Single extremity supported Standing balance-Leahy Scale: Fair                              Cognition Arousal/Alertness: Awake/alert Behavior During Therapy: WFL for tasks assessed/performed Overall Cognitive Status: Within Functional Limits for tasks assessed                                        Exercises      General Comments        Pertinent Vitals/Pain Pain Assessment: Faces Faces Pain Scale: Hurts little more Pain Location: L knee Pain Descriptors / Indicators: Aching Pain Intervention(s): Limited activity within patient's tolerance;Monitored during session;Premedicated before session    Home Living                      Prior Function            PT Goals (current goals can now be found in the care plan section) Acute Rehab PT Goals Patient Stated Goal: to get back home PT Goal Formulation: With patient Time For Goal Achievement: 09/18/17 Potential to Achieve Goals: Good Progress towards PT goals: Progressing toward goals    Frequency    Min 3X/week      PT Plan Current plan remains appropriate    Co-evaluation  AM-PAC PT "6 Clicks" Daily Activity  Outcome Measure  Difficulty turning over in bed (including adjusting bedclothes, sheets and blankets)?: A Little Difficulty moving from lying on back to sitting on the side of the bed? : A Little Difficulty sitting down on and standing up from a chair with arms (e.g., wheelchair, bedside commode, etc,.)?: A Little Help needed moving to and from a bed to chair (including a wheelchair)?: A Little Help needed walking in hospital room?: A Little Help needed climbing 3-5 steps with a railing? : A Lot 6 Click Score: 17    End of Session Equipment Utilized During Treatment: Gait belt Activity Tolerance: Patient limited by pain;Patient tolerated treatment well Patient left: in bed;with call bell/phone within  reach Nurse Communication: Mobility status PT Visit Diagnosis: Unsteadiness on feet (R26.81);Pain;Difficulty in walking, not elsewhere classified (R26.2) Pain - Right/Left: Left Pain - part of body: Knee     Time: 1062-6948 PT Time Calculation (min) (ACUTE ONLY): 20 min  Charges:  $Gait Training: 8-22 mins                     Reinaldo Berber, PT, DPT Acute Rehab Services Pager: [phone]     Reinaldo Berber 09/14/2017, 8:56 AM

## 2017-09-14 NOTE — Progress Notes (Signed)
  Progress Note    09/14/2017 2:34 PM 6 Days Post-Op  Subjective: No acute issues Back pain has resolved  Vitals:   09/14/17 0743 09/14/17 1134  BP: (!) 161/74 (!) 180/66  Pulse: (!) 59 (!) 58  Resp: 12 (!) 9  Temp: 97.6 F (36.4 C) (!) 97.3 F (36.3 C)  SpO2: 100% 100%    Physical Exam: Awake alert oriented Nonlabored respirations Abdomen is soft Bilateral common femoral pulses are palpable  CBC    Component Value Date/Time   WBC 8.9 09/14/2017 0315   RBC 3.59 (L) 09/14/2017 0315   HGB 9.8 (L) 09/14/2017 0315   HGB 10.3 (L) 06/22/2017 1106   HCT 31.0 (L) 09/14/2017 0315   HCT 31.8 (L) 06/22/2017 1106   PLT 285 09/14/2017 0315   PLT 304 06/22/2017 1106   MCV 86.4 09/14/2017 0315   MCV 77 (L) 06/22/2017 1106   MCH 27.3 09/14/2017 0315   MCHC 31.6 09/14/2017 0315   RDW 18.7 (H) 09/14/2017 0315   RDW 21.7 (H) 06/22/2017 1106   LYMPHSABS 1.4 09/07/2017 2009   LYMPHSABS 1.9 01/19/2017 1128   MONOABS 0.8 09/07/2017 2009   EOSABS 0.1 09/07/2017 2009   EOSABS 0.3 01/19/2017 1128   BASOSABS 0.0 09/07/2017 2009   BASOSABS 0.0 01/19/2017 1128    BMET    Component Value Date/Time   NA 136 09/14/2017 0315   NA 141 06/22/2017 1106   K 4.8 09/14/2017 0315   CL 107 09/14/2017 0315   CO2 18 (L) 09/14/2017 0315   GLUCOSE 142 (H) 09/14/2017 0315   BUN 72 (H) 09/14/2017 0315   BUN 31 (H) 06/22/2017 1106   CREATININE 4.21 (H) 09/14/2017 0315   CREATININE 1.10 06/10/2014 0813   CALCIUM 7.7 (L) 09/14/2017 0315   CALCIUM 7.6 (L) 06/09/2017 0230   GFRNONAA 10 (L) 09/14/2017 0315   GFRAA 12 (L) 09/14/2017 0315    INR    Component Value Date/Time   INR 1.09 09/07/2017 2009     Intake/Output Summary (Last 24 hours) at 09/14/2017 1434 Last data filed at 09/14/2017 0929 Gross per 24 hour  Intake 490 ml  Output 450 ml  Net 40 ml     Assessment:  62 y.o. female is status post thoracic endograft for acute endoleak with expansile thoracic aneurysm complicated by  postoperative acute on chronic renal insufficiency Plan: From a vascular standpoint she is progressing well.  Unfortunately we will have to follow her with noncontrasted CT scan in 6 weeks upon discharge we will plan to resume contrast images if she progresses to dialysis.   Brandon C. Donzetta Matters, MD [facility] Office: [phone] Pager: [phone]  09/14/2017 2:34 PM

## 2017-09-14 NOTE — Progress Notes (Signed)
PROGRESS NOTE    Diana Walters  OHY:[id number] DOB: Jun 12, 1955 DOA: 09/07/2017 PCP: Renato Shin, MD    Brief Narrative:  62 year old female who presented with weakness and back pain.  Does have significant past medical history for high-grade thoracic aneurysm repair 2014 ([facility]), anemia, asthma, osteoarthritis, rheumatoid arthritis, tobacco abuse, hypertension, dyslipidemia, seizures, CVA and depression.  She was diagnosed with severe anemia, hemoglobin 4.6 and advised to come to the emergency room department.  On further questioning reported intermittent pain between her shoulder blades for the last 2 weeks.  Blood pressure 145/79, heart rate 74, temperature 98.6, respiratory 28, oxygen saturation 96%.  Awake and alert, lungs clear to auscultation bilaterally, heart S1-S2 present and rhythmic, abdomen soft and nontender, 1+ lower extremity edema.  CT angiography of her chest showed aortic endograft extending from the ascending to the lower descending thoracic aorta.  Evidence of acute endoleak at the level of the thoracic arch with active extravasation of contrast material into the native aneurysm sac causing enlargement of the native sac.  Extensive hematoma in the mediastinum around the arch and descending aorta.  Left pleural effusion.  No evidence of aneurysm or dissection in the abdominal aorta.  Patient was admitted to the intensive care unit with a working diagnosis of acute blood loss anemia due to extensive mediastinal hematoma related to aortic aneurysm.   07/25.  Vascular repair of aortic arch and descending thoracic aorta with proximal 34 x 200 mm extended into the descending aorta with 34 x 100 mm extension.  Postoperatively he has developed acute kidney injury.  Assessment & Plan:   Active Problems:   Symptomatic anemia   1.  Thoracic aortic endovascular leak with acute blood loss anemia due to mediastinal hematoma. SP vascular repair, hb and hct have remained stable, no  significant chest pain or dyspnea. Continue physical therapy and out of bed as tolerated. Bowel regimen with dulcolax.   2. AKI on CKD stage IV with hyperkalemia. Stable renal function with serum cr at 4,21, with K at 4,8 and serum bicarbonate at 18. Positive volume overload, now on high doses of furosemide, will continue to monitor volume status and renal function, am bmp.   3. Anemia of chronic renal disease. On ferric gluconate and darbapoetin   3. HTN. Continue blood pressure control with amlodipine and metoprolol.   4. Hx of CVA. Continue neuro checks per unit protocol, physical therapy.   5. RA/ OA. Improved pain and mobility, will continue prednisone 40 mg daily. Continue oxycodone, will dc hydromorphone.   6. T2DM. Will continue glucose cover and monitoring per insulin sliding scale.   DVT prophylaxis:heparin   Code Status:  full Family Communication: no family at the bedside  Disposition Plan/ discharge barriers: SNF placement, after renal function improvement.    Consultants:   Nephrology   Vascular surgery   Procedures:  07/25.  Vascular repair of aortic arch and descending thoracic aorta with proximal 34 x 200 mm extended into the descending aorta with 34 x 100 mm extension.  Antimicrobials:       Subjective: Patient is feeling well, no nausea or vomiting, has been out of bed with physical therapy. No chest pain or dyspnea. No bowel movement for the last 7 days.   Objective: Vitals:   09/14/17 0400 09/14/17 0500 09/14/17 0600 09/14/17 0743  BP:    (!) 161/74  Pulse: (!) 58 (!) 57 (!) 54 (!) 59  Resp: 11 10 12 12   Temp:    97.6  F (36.4 C)  TempSrc:    Oral  SpO2: 100% 100% 100% 100%  Weight:    72.1 kg (158 lb 15.2 oz)  Height:        Intake/Output Summary (Last 24 hours) at 09/14/2017 0934 Last data filed at 09/14/2017 0929 Gross per 24 hour  Intake 490 ml  Output 450 ml  Net 40 ml   Filed Weights   09/13/17 0358 09/14/17 0350 09/14/17 0743    Weight: 70.1 kg (154 lb 9.6 oz) 72.1 kg (158 lb 15.2 oz) 72.1 kg (158 lb 15.2 oz)    Examination:   General: deconditioned, not in pain or dyspnea Neurology: Awake and alert, non focal  E ENT: mild pallor, no icterus, oral mucosa moist Cardiovascular: No JVD. S1-S2 present, rhythmic, no gallops, rubs, or murmurs. No lower extremity edema. Pulmonary: decreased breath sounds bilaterally, adequate air movement, no wheezing, rhonchi or rales. Gastrointestinal. Abdomen with  no organomegaly, non tender, no rebound or guarding Skin. No rashes Musculoskeletal: no joint deformities     Data Reviewed: I have personally reviewed following labs and imaging studies  CBC: Recent Labs  Lab 09/07/17 2009  09/09/17 0332  09/10/17 0440 09/10/17 1712 09/11/17 0500 09/12/17 0531 09/13/17 0320 09/14/17 0315  WBC 10.1   < > 10.6*  --  9.9  --   --  7.3 7.4 8.9  NEUTROABS 7.8*  --   --   --   --   --   --   --   --   --   HGB 5.1*   < > 6.7*   < > 8.8* 9.9* 9.4* 9.2* 9.3* 9.8*  HCT 17.5*   < > 21.5*   < > 27.6* 31.0* 30.1* 29.0* 29.4* 31.0*  MCV 88.4   < > 86.7  --  87.9  --   --  86.8 87.5 86.4  PLT 539*   < > 275  --  215  --   --  255 271 285   < > = values in this interval not displayed.   Basic Metabolic Panel: Recent Labs  Lab 09/08/17 0609  09/10/17 0440 09/11/17 1200 09/12/17 0531 09/13/17 0320 09/14/17 0315  NA 141   < > 139 136 137 135 136  K 4.8   < > 4.6 5.5* 5.1 4.9 4.8  CL 111   < > 107 109 107 107 107  CO2 16*   < > 20* 21* 19* 19* 18*  GLUCOSE 68*   < > 82 122* 141* 129* 142*  BUN 50*   < > 42* 51* 57* 64* 72*  CREATININE 3.02*   < > 3.28* 3.90* 4.40* 4.49* 4.21*  CALCIUM 8.1*   < > 7.9* 8.0* 7.9* 7.6* 7.7*  MG 2.1  --   --   --   --   --   --   PHOS 5.1*  --   --   --   --  5.9*  --    < > = values in this interval not displayed.   GFR: Estimated Creatinine Clearance: 14.8 mL/min (A) (by C-G formula based on SCr of 4.21 mg/dL (H)). Liver Function  Tests: Recent Labs  Lab 09/07/17 2009 09/09/17 0332  AST 19 14*  ALT 12 10  ALKPHOS 57 46  BILITOT 0.5 0.5  PROT 7.9 6.0*  ALBUMIN 2.7* 2.2*   No results for input(s): LIPASE, AMYLASE in the last 168 hours. No results for input(s): AMMONIA in the last 168  hours. Coagulation Profile: Recent Labs  Lab 09/07/17 2009  INR 1.09   Cardiac Enzymes: Recent Labs  Lab 09/08/17 0133  TROPONINI <0.03   BNP (last 3 results) No results for input(s): PROBNP in the last 8760 hours. HbA1C: No results for input(s): HGBA1C in the last 72 hours. CBG: Recent Labs  Lab 09/13/17 1208 09/13/17 1734 09/13/17 2120 09/14/17 0618 09/14/17 0748  GLUCAP 110* 112* 122* 128* 93   Lipid Profile: No results for input(s): CHOL, HDL, LDLCALC, TRIG, CHOLHDL, LDLDIRECT in the last 72 hours. Thyroid Function Tests: No results for input(s): TSH, T4TOTAL, FREET4, T3FREE, THYROIDAB in the last 72 hours. Anemia Panel: No results for input(s): VITAMINB12, FOLATE, FERRITIN, TIBC, IRON, RETICCTPCT in the last 72 hours.    Radiology Studies: I have reviewed all of the imaging during this hospital visit personally     Scheduled Meds: . amLODipine  5 mg Oral Daily  . darbepoetin (ARANESP) injection - NON-DIALYSIS  100 mcg Subcutaneous Q Fri-1800  . docusate sodium  100 mg Oral Daily  . famotidine  20 mg Oral Daily  . heparin  5,000 Units Subcutaneous Q8H  . insulin aspart  1-3 Units Subcutaneous TID AC & HS  . metoprolol tartrate  12.5 mg Oral Daily  . pantoprazole  40 mg Oral Daily  . polyethylene glycol  17 g Oral BID  . predniSONE  40 mg Oral Q breakfast  . sodium chloride flush  10-40 mL Intracatheter Q12H   Continuous Infusions: . sodium chloride    . furosemide    . magnesium sulfate 1 - 4 g bolus IVPB       LOS: 6 days        Diana Diprima Gerome Apley, MD [facility] Pager [phone]

## 2017-09-14 NOTE — Progress Notes (Signed)
Occupational Therapy Treatment Patient Details Name: Diana Walters MRN: [id number] DOB: [birth year] Today's Date: 09/14/2017    History of present illness 62 y.o. female s/p TEVAR 7/25. Pt also presenting with severe L knee pain. PMH includes: CVA, HTN, Seizure, AKI thoracic aortic aneurysm, RA   OT comments  Upon arrival, pt sitting on BSC for BM. Pt requiring Min Guard A for safety during toileting due to decreased stability with significant pain at LLE. Due to pain at left knee, pt requiring Max A for donning socks. Pt performing grooming tasks at sink with supervision. Pt highly motivated but is limited by significant pain. Continue to recommend dc to home with HHOT and will continue to follow acutely as admitted.     Home health OT;Supervision/Assistance - 24 hour    Equipment Recommendations  Tub/shower seat    Recommendations for Other Services      Precautions / Restrictions Precautions Precautions: Fall Restrictions Weight Bearing Restrictions: No       Mobility Bed Mobility Overal bed mobility: Needs Assistance Bed Mobility: Sit to Supine       Sit to supine: Min assist   General bed mobility comments: Min A for managing LLE  Transfers Overall transfer level: Needs assistance Equipment used: Rolling walker (2 wheeled) Transfers: Sit to/from Stand Sit to Stand: Min guard         General transfer comment: Min Guard A for safety and requiring increased time and effort due to significant pain    Balance Overall balance assessment: Needs assistance Sitting-balance support: No upper extremity supported Sitting balance-Leahy Scale: Good     Standing balance support: Single extremity supported Standing balance-Leahy Scale: Fair                             ADL either performed or assessed with clinical judgement   ADL Overall ADL's : Needs assistance/impaired     Grooming: Oral care;Wash/dry face;Wash/dry hands;Supervision/safety;Set  up;Standing Grooming Details (indicate cue type and reason): supervision for safety. Pt able to maintain static standing balance for performing grooming at sink.              Lower Body Dressing: Maximal assistance;Sit to/from stand Lower Body Dressing Details (indicate cue type and reason): Max A to don socks at this time due to significant pain at left knee. Pt unable to bend left knee for donning left sock and unable to bring right ankle to left knee to due pain with resting on knee.  Toilet Transfer: Scientist, research (medical) Details (indicate cue type and reason): Min Guard A for safety. Pt requiring significant time and effort due to pain with transition Toileting- Clothing Manipulation and Hygiene: Min guard;Sit to/from stand Toileting - Clothing Manipulation Details (indicate cue type and reason): Min Guard A for safety during toilet hygiene     Functional mobility during ADLs: Min guard;Rolling walker General ADL Comments: Upon arrival, pt sitting on BSC (for BM). Pt performing toileting with MIn GUard, LB dressing with Max A, and grooming with supervision. Pt with significant pain limiting funcitonal performance. Pt highly motivated.      Vision       Perception     Praxis      Cognition Arousal/Alertness: Awake/alert Behavior During Therapy: WFL for tasks assessed/performed Overall Cognitive Status: Within Functional Limits for tasks assessed  Exercises     Shoulder Instructions       General Comments VSS. Pt having large BM; RN informed    Pertinent Vitals/ Pain       Pain Assessment: Faces Faces Pain Scale: Hurts whole lot Pain Location: L knee Pain Descriptors / Indicators: Crying;Grimacing;Pressure;Shooting Pain Intervention(s): Monitored during session;Limited activity within patient's tolerance;Repositioned;Patient requesting pain meds-RN notified  Home Living                                           Prior Functioning/Environment              Frequency  Min 2X/week        Progress Toward Goals  OT Goals(current goals can now be found in the care plan section)  Progress towards OT goals: Progressing toward goals  Acute Rehab OT Goals Patient Stated Goal: to get back home OT Goal Formulation: With patient Time For Goal Achievement: 09/26/17 Potential to Achieve Goals: Good ADL Goals Pt Will Perform Grooming: with modified independence Pt Will Perform Lower Body Dressing: with modified independence Pt Will Transfer to Toilet: with modified independence Pt Will Perform Toileting - Clothing Manipulation and hygiene: with modified independence  Plan Discharge plan remains appropriate    Co-evaluation                 AM-PAC PT "6 Clicks" Daily Activity     Outcome Measure   Help from another person eating meals?: None Help from another person taking care of personal grooming?: A Little Help from another person toileting, which includes using toliet, bedpan, or urinal?: A Little Help from another person bathing (including washing, rinsing, drying)?: A Little Help from another person to put on and taking off regular upper body clothing?: A Little Help from another person to put on and taking off regular lower body clothing?: A Little 6 Click Score: 19    End of Session Equipment Utilized During Treatment: Rolling walker  OT Visit Diagnosis: Unsteadiness on feet (R26.81);Other abnormalities of gait and mobility (R26.89);Muscle weakness (generalized) (M62.81);Pain Pain - Right/Left: Left Pain - part of body: Knee   Activity Tolerance Patient limited by pain   Patient Left with call bell/phone within reach;in bed   Nurse Communication Mobility status;Other (comment);Patient requests pain meds(BM)        Time: 3428-7681 OT Time Calculation (min): 38 min  Charges: OT General Charges $OT Visit: 1 Visit OT Treatments $Self  Care/Home Management : 38-52 mins  [place], OTR/L Acute Rehab Pager: [phone] Office: [place] 09/14/2017, 4:00 PM

## 2017-09-14 NOTE — Care Management Important Message (Signed)
Important Message  Patient Details  Name: Diana Walters MRN: [id number] Date of Birth: Oct 28, 1955   Medicare Important Message Given:  Yes    Barb Merino Briahnna Harries 09/14/2017, 3:56 PM

## 2017-09-15 ENCOUNTER — Other Ambulatory Visit: Payer: Self-pay

## 2017-09-15 DIAGNOSIS — I712 Thoracic aortic aneurysm, without rupture, unspecified: Secondary | ICD-10-CM

## 2017-09-15 LAB — GLUCOSE, CAPILLARY
GLUCOSE-CAPILLARY: 75 mg/dL (ref 70–99)
GLUCOSE-CAPILLARY: 150 mg/dL — AB (ref 70–99)
Glucose-Capillary: 100 mg/dL — ABNORMAL HIGH (ref 70–99)
Glucose-Capillary: 142 mg/dL — ABNORMAL HIGH (ref 70–99)

## 2017-09-15 LAB — BASIC METABOLIC PANEL
Anion gap: 8 (ref 5–15)
BUN: 77 mg/dL — AB (ref 8–23)
CHLORIDE: 108 mmol/L (ref 98–111)
CO2: 19 mmol/L — ABNORMAL LOW (ref 22–32)
CREATININE: 4.02 mg/dL — AB (ref 0.44–1.00)
Calcium: 7.7 mg/dL — ABNORMAL LOW (ref 8.9–10.3)
GFR calc Af Amer: 13 mL/min — ABNORMAL LOW (ref >60)
GFR calc non Af Amer: 11 mL/min — ABNORMAL LOW (ref >60)
GLUCOSE: 102 mg/dL — AB (ref 70–99)
POTASSIUM: 5 mmol/L (ref 3.5–5.1)
Sodium: 135 mmol/L (ref 135–145)

## 2017-09-15 LAB — CBC WITH DIFFERENTIAL/PLATELET
Abs Immature Granulocytes: 0.1 10*3/uL (ref 0.0–0.1)
BASOS PCT: 0 %
Basophils Absolute: 0 10*3/uL (ref 0.0–0.1)
EOS PCT: 0 %
Eosinophils Absolute: 0 10*3/uL (ref 0.0–0.7)
HCT: 32 % — ABNORMAL LOW (ref 36.0–46.0)
HEMOGLOBIN: 10.1 g/dL — AB (ref 12.0–15.0)
Immature Granulocytes: 1 %
Lymphocytes Relative: 11 %
Lymphs Abs: 1.2 10*3/uL (ref 0.7–4.0)
MCH: 27.3 pg (ref 26.0–34.0)
MCHC: 31.6 g/dL (ref 30.0–36.0)
MCV: 86.5 fL (ref 78.0–100.0)
MONO ABS: 0.8 10*3/uL (ref 0.1–1.0)
MONOS PCT: 8 %
Neutro Abs: 8.6 10*3/uL — ABNORMAL HIGH (ref 1.7–7.7)
Neutrophils Relative %: 80 %
Platelets: 291 10*3/uL (ref 150–400)
RBC: 3.7 MIL/uL — ABNORMAL LOW (ref 3.87–5.11)
RDW: 19.5 % — ABNORMAL HIGH (ref 11.5–15.5)
WBC: 10.7 10*3/uL — ABNORMAL HIGH (ref 4.0–10.5)

## 2017-09-15 LAB — BODY FLUID CULTURE: Culture: NO GROWTH

## 2017-09-15 MED ORDER — PREDNISONE 20 MG PO TABS
20.0000 mg | ORAL_TABLET | Freq: Every day | ORAL | Status: DC
  Administered 2017-09-16 – 2017-09-17 (×2): 20 mg via ORAL
  Filled 2017-09-15 (×2): qty 1

## 2017-09-15 NOTE — Progress Notes (Signed)
PROGRESS NOTE    Diana Walters  JGG:[id number] DOB: December 10, 1955 DOA: 09/07/2017 PCP: Renato Shin, MD    Brief Narrative:  62 year old female who presented with weakness and back pain.  Does have significant past medical history for high-grade thoracic aneurysm repair 2014 ([facility]), anemia, asthma, osteoarthritis, rheumatoid arthritis, tobacco abuse, hypertension, dyslipidemia, seizures, CVA and depression.  She was diagnosed with severe anemia, hemoglobin 4.6 and advised to come to the emergency room department.  On further questioning reported intermittent pain between her shoulder blades for the last 2 weeks.  Blood pressure 145/79, heart rate 74, temperature 98.6, respiratory 28, oxygen saturation 96%.  Awake and alert, lungs clear to auscultation bilaterally, heart S1-S2 present and rhythmic, abdomen soft and nontender, 1+ lower extremity edema.  CT angiography of her chest showed aortic endograft extending from the ascending to the lower descending thoracic aorta.  Evidence of acute endoleak at the level of the thoracic arch with active extravasation of contrast material into the native aneurysm sac causing enlargement of the native sac.  Extensive hematoma in the mediastinum around the arch and descending aorta.  Left pleural effusion.  No evidence of aneurysm or dissection in the abdominal aorta.  Patient was admitted to the intensive care unit with a working diagnosis of acute blood loss anemia due to extensive mediastinal hematoma related to aortic aneurysm.   07/25.  Vascular repair of aortic arch and descending thoracic aorta with proximal 34 x 200 mm extended into the descending aorta with 34 x 100 mm extension.  Postoperatively he has developed acute kidney injury.   Assessment & Plan:   Active Problems:   Symptomatic anemia    1.  Thoracic aortic endovascular leak with acute blood loss anemia due to mediastinal hematoma.  Physical therapy and out of bed as tolerated. Bowel  regimen with dulcolax. Pending placement at SNF  2. AKI on CKD stage IV with hyperkalemia. Urine output over last 24 hours 1000 ml, stable renal function with serum cr at 4.0 with K at 5,0 and serum bicarbonate at 19, will follow on renal panel in am. Continue furosemide and metolazone.   3. Anemia of chronic renal disease. Continue ferric gluconate and darbapoetin   3. HTN. On amlodipine and metoprolol for blood pressure control.   4. Hx of CVA. Physical therapy.   5. RA/ OA. Will decreased  prednisone to 20 mg daily. Pain control with  Oxycodone.    6. T2DM. Glucose cover and monitoring with insulin sliding scale. Capillary glucose 114, 160, 182, 75, 100.   DVT prophylaxis:heparin   Code Status:  full Family Communication: no family at the bedside  Disposition Plan/ discharge barriers: SNF placement, after renal function improvement.    Consultants:   Nephrology   Vascular surgery   Procedures:  07/25.  Vascular repair of aortic arch and descending thoracic aorta with proximal 34 x 200 mm extended into the descending aorta with 34 x 100 mm extension.  Antimicrobials:     Subjective: Patient is feeling better, improved edema, but not back to baseline, no nausea or vomiting, no chest pain or significant dyspnea.   Objective: Vitals:   09/14/17 1937 09/14/17 2347 09/15/17 0338 09/15/17 0743  BP: (!) 157/72 140/65 (!) 167/69 (!) 153/69  Pulse: 62   (!) 58  Resp: 11 15 14 11   Temp: 97.6 F (36.4 C) 97.8 F (36.6 C) 98.1 F (36.7 C) 97.9 F (36.6 C)  TempSrc: Oral Oral Oral Oral  SpO2: 100%   100%  Weight:   70.9 kg (156 lb 4.9 oz)   Height:        Intake/Output Summary (Last 24 hours) at 09/15/2017 0835 Last data filed at 09/15/2017 0600 Gross per 24 hour  Intake 670 ml  Output 1050 ml  Net -380 ml   Filed Weights   09/14/17 0350 09/14/17 0743 09/15/17 0338  Weight: 72.1 kg (158 lb 15.2 oz) 72.1 kg (158 lb 15.2 oz) 70.9 kg (156 lb 4.9 oz)     Examination:   General: Not in pain or dyspnea, deconditioned  Neurology: Awake and alert, non focal  E ENT: mild pallor, no icterus, oral mucosa moist Cardiovascular: No JVD. S1-S2 present, rhythmic, no gallops, rubs, or murmurs. +/++  lower extremity edema. Pulmonary: decreased breath sounds bilaterally at bases, no wheezing, rhonchi or rales. Gastrointestinal. Abdomen with no organomegaly, non tender, no rebound or guarding Skin. No rashes Musculoskeletal: no joint deformities     Data Reviewed: I have personally reviewed following labs and imaging studies  CBC: Recent Labs  Lab 09/10/17 0440  09/11/17 0500 09/12/17 0531 09/13/17 0320 09/14/17 0315 09/15/17 0304  WBC 9.9  --   --  7.3 7.4 8.9 10.7*  NEUTROABS  --   --   --   --   --   --  8.6*  HGB 8.8*   < > 9.4* 9.2* 9.3* 9.8* 10.1*  HCT 27.6*   < > 30.1* 29.0* 29.4* 31.0* 32.0*  MCV 87.9  --   --  86.8 87.5 86.4 86.5  PLT 215  --   --  255 271 285 291   < > = values in this interval not displayed.   Basic Metabolic Panel: Recent Labs  Lab 09/11/17 1200 09/12/17 0531 09/13/17 0320 09/14/17 0315 09/15/17 0304  NA 136 137 135 136 135  K 5.5* 5.1 4.9 4.8 5.0  CL 109 107 107 107 108  CO2 21* 19* 19* 18* 19*  GLUCOSE 122* 141* 129* 142* 102*  BUN 51* 57* 64* 72* 77*  CREATININE 3.90* 4.40* 4.49* 4.21* 4.02*  CALCIUM 8.0* 7.9* 7.6* 7.7* 7.7*  PHOS  --   --  5.9*  --   --    GFR: Estimated Creatinine Clearance: 15.5 mL/min (A) (by C-G formula based on SCr of 4.02 mg/dL (H)). Liver Function Tests: Recent Labs  Lab 09/09/17 0332  AST 14*  ALT 10  ALKPHOS 46  BILITOT 0.5  PROT 6.0*  ALBUMIN 2.2*   No results for input(s): LIPASE, AMYLASE in the last 168 hours. No results for input(s): AMMONIA in the last 168 hours. Coagulation Profile: No results for input(s): INR, PROTIME in the last 168 hours. Cardiac Enzymes: No results for input(s): CKTOTAL, CKMB, CKMBINDEX, TROPONINI in the last 168  hours. BNP (last 3 results) No results for input(s): PROBNP in the last 8760 hours. HbA1C: No results for input(s): HGBA1C in the last 72 hours. CBG: Recent Labs  Lab 09/14/17 0618 09/14/17 0748 09/14/17 1218 09/14/17 1810 09/14/17 2109  GLUCAP 128* 93 114* 160* 182*   Lipid Profile: No results for input(s): CHOL, HDL, LDLCALC, TRIG, CHOLHDL, LDLDIRECT in the last 72 hours. Thyroid Function Tests: No results for input(s): TSH, T4TOTAL, FREET4, T3FREE, THYROIDAB in the last 72 hours. Anemia Panel: No results for input(s): VITAMINB12, FOLATE, FERRITIN, TIBC, IRON, RETICCTPCT in the last 72 hours.    Radiology Studies: I have reviewed all of the imaging during this hospital visit personally     Scheduled Meds: . amLODipine  5 mg Oral Daily  . darbepoetin (ARANESP) injection - NON-DIALYSIS  100 mcg Subcutaneous Q Fri-1800  . docusate sodium  100 mg Oral Daily  . famotidine  20 mg Oral Daily  . heparin  5,000 Units Subcutaneous Q8H  . insulin aspart  0-9 Units Subcutaneous TID WC  . metoprolol tartrate  12.5 mg Oral Daily  . pantoprazole  40 mg Oral Daily  . polyethylene glycol  17 g Oral BID  . predniSONE  40 mg Oral Q breakfast  . sodium chloride flush  10-40 mL Intracatheter Q12H   Continuous Infusions: . sodium chloride    . ferric gluconate (FERRLECIT/NULECIT) IV 125 mg (09/14/17 1425)  . furosemide 120 mg (09/14/17 2108)  . magnesium sulfate 1 - 4 g bolus IVPB       LOS: 7 days        Mauricio Gerome Apley, MD [facility] Pager [phone]

## 2017-09-15 NOTE — Care Management Note (Signed)
Case Management Note  Patient Details  Name: MARIACELESTE HERRERA MRN: [id number] Date of Birth: Jul 13, 1955  Subjective/Objective:   Pt admitted with CP - s/p AAA repair, now in renal failure  - not yet determined to need HD                 Action/Plan:  PTA Independent from home.  Pts family will provide 24 hour supervision as recommended.  CM provided hardcopy choice list for HH/DME as recommended.  CM will follow back up with pt.   Expected Discharge Date:                  Expected Discharge Plan:  [place]  In-House Referral:     Discharge planning Services  CM Consult  Post Acute Care Choice:    Choice offered to:     DME Arranged:    DME Agency:     HH Arranged:    HH Agency:     Status of Service:     If discussed at [employer], dates discussed:    Additional Comments: 09/15/2017 CM checked back with pt to get choice for [facility] agency - unfortunately pt has still not decided on [facility] agency.  CM will check back with pt  Maryclare Labrador, RN 09/15/2017, 4:02 PM

## 2017-09-15 NOTE — Progress Notes (Signed)
[facility] ROUNDING NOTE   Subjective:   Didn't diurese with lasix 80 so this was increased to 120 IV BID yesterday and then metolazone 5mg  was added in the PM.  Net neg 1L yesterday. She feels like edema is improving.       Objective:  Vital signs in last 24 hours:  Temp:  [97.3 F (36.3 C)-98.1 F (36.7 C)] 97.9 F (36.6 C) (08/01 0743) Pulse Rate:  [57-62] 58 (08/01 0743) Resp:  [9-15] 11 (08/01 0743) BP: (115-180)/(65-72) 153/69 (08/01 0743) SpO2:  [100 %] 100 % (08/01 0743) Weight:  [70.9 kg (156 lb 4.9 oz)] 70.9 kg (156 lb 4.9 oz) (08/01 0338)  Weight change: 0 kg (0 lb) Filed Weights   09/14/17 0350 09/14/17 0743 09/15/17 0338  Weight: 72.1 kg (158 lb 15.2 oz) 72.1 kg (158 lb 15.2 oz) 70.9 kg (156 lb 4.9 oz)   Intake/Output: I/O last 3 completed shifts: In: 951 [P.O.:660; I.V.:10] Out: 1500 [Urine:1500]   Intake/Output this shift:  No intake/output data recorded.  Physical Exam: General: comfortably lying in bed at 45 degrees CVS- RRR, Grade 4 Sys murmur RS- CTA bilaterally  ABD- BS present soft non-distended EXT- L knee swollen, 1+ generalized edema, LUE improved but still 1+  Basic Metabolic Panel: Recent Labs  Lab 09/11/17 1200 09/12/17 0531 09/13/17 0320 09/14/17 0315 09/15/17 0304  NA 136 137 135 136 135  K 5.5* 5.1 4.9 4.8 5.0  CL 109 107 107 107 108  CO2 21* 19* 19* 18* 19*  GLUCOSE 122* 141* 129* 142* 102*  BUN 51* 57* 64* 72* 77*  CREATININE 3.90* 4.40* 4.49* 4.21* 4.02*  CALCIUM 8.0* 7.9* 7.6* 7.7* 7.7*  PHOS  --   --  5.9*  --   --     Liver Function Tests: Recent Labs  Lab 09/09/17 0332  AST 14*  ALT 10  ALKPHOS 46  BILITOT 0.5  PROT 6.0*  ALBUMIN 2.2*   No results for input(s): LIPASE, AMYLASE in the last 168 hours. No results for input(s): AMMONIA in the last 168 hours.  CBC: Recent Labs  Lab 09/10/17 0440  09/11/17 0500 09/12/17 0531 09/13/17 0320 09/14/17 0315 09/15/17 0304  WBC 9.9  --   --  7.3  7.4 8.9 10.7*  NEUTROABS  --   --   --   --   --   --  8.6*  HGB 8.8*   < > 9.4* 9.2* 9.3* 9.8* 10.1*  HCT 27.6*   < > 30.1* 29.0* 29.4* 31.0* 32.0*  MCV 87.9  --   --  86.8 87.5 86.4 86.5  PLT 215  --   --  255 271 285 291   < > = values in this interval not displayed.    Cardiac Enzymes: No results for input(s): CKTOTAL, CKMB, CKMBINDEX, TROPONINI in the last 168 hours.  BNP: Invalid input(s): POCBNP  CBG: Recent Labs  Lab 09/14/17 0618 09/14/17 0748 09/14/17 1218 09/14/17 1810 09/14/17 2109  GLUCAP 128* 43 114* 160* 182*    Microbiology: Results for orders placed or performed during the hospital encounter of 09/07/17  Surgical pcr screen     Status: None   Collection Time: 09/08/17  4:59 AM  Result Value Ref Range Status   MRSA, PCR NEGATIVE NEGATIVE Final   Staphylococcus aureus NEGATIVE NEGATIVE Final    Comment: (NOTE) The Xpert SA Assay (FDA approved for NASAL specimens in patients 27 years of age and older), is one component of a  comprehensive surveillance program. It is not intended to diagnose infection nor to guide or monitor treatment. Performed at [facility], [place] [address], [place], [place] [address]   Body fluid culture     Status: None (Preliminary result)   Collection Time: 09/11/17  6:21 PM  Result Value Ref Range Status   Specimen Description KNEE  Final   Special Requests FLUID  Final   Gram Stain   Final    ABUNDANT WBC PRESENT, PREDOMINANTLY PMN NO ORGANISMS SEEN    Culture   Final    NO GROWTH 3 DAYS Performed at [facility] [address], [place], [place] [address]    Report Status PENDING  Incomplete    Coagulation Studies: No results for input(s): LABPROT, INR in the last 72 hours.  Urinalysis: No results for input(s): COLORURINE, LABSPEC, PHURINE, GLUCOSEU, HGBUR, BILIRUBINUR, KETONESUR, PROTEINUR, UROBILINOGEN, NITRITE, LEUKOCYTESUR in the last 72 hours.  Invalid input(s): APPERANCEUR    Imaging: No  results found.   Medications:   . sodium chloride    . ferric gluconate (FERRLECIT/NULECIT) IV 125 mg (09/14/17 1425)  . furosemide 120 mg (09/14/17 2108)  . magnesium sulfate 1 - 4 g bolus IVPB     . amLODipine  5 mg Oral Daily  . darbepoetin (ARANESP) injection - NON-DIALYSIS  100 mcg Subcutaneous Q Fri-1800  . docusate sodium  100 mg Oral Daily  . famotidine  20 mg Oral Daily  . heparin  5,000 Units Subcutaneous Q8H  . insulin aspart  0-9 Units Subcutaneous TID WC  . metoprolol tartrate  12.5 mg Oral Daily  . pantoprazole  40 mg Oral Daily  . polyethylene glycol  17 g Oral BID  . predniSONE  40 mg Oral Q breakfast  . sodium chloride flush  10-40 mL Intracatheter Q12H   sodium chloride, hydrALAZINE, magnesium sulfate 1 - 4 g bolus IVPB, ondansetron (ZOFRAN) IV, oxyCODONE-acetaminophen, sodium chloride flush    Assessment/ Plan:  1. Renal - CKD stage V - Renal function a bit worse than baseline in setting of contrast, recent surgery.  She has no current indications for dialysis but is volume overloaded.  Sodium and fluid restriction.  Continue lasix 120 IV BID + metolazone 5. Hopefully barring further insults renal function remain stable or improve.  2. Hypertension/volume  - amlodipine 5mg , metoprolol 12.5 daily.  Volume up so diuresis will help.  Defer med titration to primary but happy to help if staying high.  It's been a bit better in the past 12hrs.   3.  Anemia, normocytic, acute on chronic - Multifactorial with AoCKD and acute blood loss contributing this admission.  Darbopoeitin 100 given 7/27, redose 8/2.  7/31 Hb 9.8.  Iron sat 4% on 09/07/17.  Has rec'd 2u pRBC which will help with iron deficiency.  Give nulecit 125mg  IV x 4 doses while here; ordered 09/14/17. 4. BMM: phos 5.9 on 7/30, check in AM. Corr ca ok.   Will continue to follow closely.  Page with any questions or concerns. Jannifer Hick MD   LOS: 7

## 2017-09-15 NOTE — Progress Notes (Signed)
Refused ambulation sec. to arthritic pain. Explained the significance of ambulation but still insisted not to.

## 2017-09-15 NOTE — Progress Notes (Signed)
  Progress Note    09/15/2017 10:45 AM 7 Days Post-Op  Subjective:  Denies pain this morning  Vitals:   09/15/17 0338 09/15/17 0743  BP: (!) 167/69 (!) 153/69  Pulse:  (!) 58  Resp: 14 11  Temp: 98.1 F (36.7 C) 97.9 F (36.6 C)  SpO2:  100%    Physical Exam: aaox3 Non labored respirations Abdomen is soft Palpable femoral pulses  CBC    Component Value Date/Time   WBC 10.7 (H) 09/15/2017 0304   RBC 3.70 (L) 09/15/2017 0304   HGB 10.1 (L) 09/15/2017 0304   HGB 10.3 (L) 06/22/2017 1106   HCT 32.0 (L) 09/15/2017 0304   HCT 31.8 (L) 06/22/2017 1106   PLT 291 09/15/2017 0304   PLT 304 06/22/2017 1106   MCV 86.5 09/15/2017 0304   MCV 77 (L) 06/22/2017 1106   MCH 27.3 09/15/2017 0304   MCHC 31.6 09/15/2017 0304   RDW 19.5 (H) 09/15/2017 0304   RDW 21.7 (H) 06/22/2017 1106   LYMPHSABS 1.2 09/15/2017 0304   LYMPHSABS 1.9 01/19/2017 1128   MONOABS 0.8 09/15/2017 0304   EOSABS 0.0 09/15/2017 0304   EOSABS 0.3 01/19/2017 1128   BASOSABS 0.0 09/15/2017 0304   BASOSABS 0.0 01/19/2017 1128    BMET    Component Value Date/Time   NA 135 09/15/2017 0304   NA 141 06/22/2017 1106   K 5.0 09/15/2017 0304   CL 108 09/15/2017 0304   CO2 19 (L) 09/15/2017 0304   GLUCOSE 102 (H) 09/15/2017 0304   BUN 77 (H) 09/15/2017 0304   BUN 31 (H) 06/22/2017 1106   CREATININE 4.02 (H) 09/15/2017 0304   CREATININE 1.10 06/10/2014 0813   CALCIUM 7.7 (L) 09/15/2017 0304   CALCIUM 7.6 (L) 06/09/2017 0230   GFRNONAA 11 (L) 09/15/2017 0304   GFRAA 13 (L) 09/15/2017 0304    INR    Component Value Date/Time   INR 1.09 09/07/2017 2009     Intake/Output Summary (Last 24 hours) at 09/15/2017 1045 Last data filed at 09/15/2017 0600 Gross per 24 hour  Intake 420 ml  Output 1050 ml  Net -630 ml     Assessment:  62 y.o. female is s/p TEVAR for acute appearing endoleak with TAA expansion  Plan: Cr improved, nephrology following She will f/u in 4-6 weeks with non contrasted CT to  eval thoracic aneurysm     Rondel Episcopo C. Donzetta Matters, MD [facility] Office: [phone] Pager: [phone]  09/15/2017 10:45 AM

## 2017-09-16 LAB — BASIC METABOLIC PANEL
Anion gap: 10 (ref 5–15)
BUN: 81 mg/dL — AB (ref 8–23)
CALCIUM: 8 mg/dL — AB (ref 8.9–10.3)
CO2: 20 mmol/L — ABNORMAL LOW (ref 22–32)
Chloride: 105 mmol/L (ref 98–111)
Creatinine, Ser: 4.16 mg/dL — ABNORMAL HIGH (ref 0.44–1.00)
GFR calc Af Amer: 12 mL/min — ABNORMAL LOW (ref >60)
GFR, EST NON AFRICAN AMERICAN: 10 mL/min — AB (ref >60)
GLUCOSE: 120 mg/dL — AB (ref 70–99)
Potassium: 5.2 mmol/L — ABNORMAL HIGH (ref 3.5–5.1)
Sodium: 135 mmol/L (ref 135–145)

## 2017-09-16 LAB — GLUCOSE, CAPILLARY
Glucose-Capillary: 98 mg/dL (ref 70–99)
Glucose-Capillary: 119 mg/dL — ABNORMAL HIGH (ref 70–99)
Glucose-Capillary: 190 mg/dL — ABNORMAL HIGH (ref 70–99)
Glucose-Capillary: 174 mg/dL — ABNORMAL HIGH (ref 70–99)

## 2017-09-16 MED ORDER — ATORVASTATIN CALCIUM 40 MG PO TABS
40.0000 mg | ORAL_TABLET | Freq: Every day | ORAL | Status: DC
  Administered 2017-09-16 – 2017-09-22 (×7): 40 mg via ORAL
  Filled 2017-09-16 (×8): qty 1

## 2017-09-16 MED ORDER — AMLODIPINE BESYLATE 10 MG PO TABS
10.0000 mg | ORAL_TABLET | Freq: Every day | ORAL | Status: DC
  Administered 2017-09-16 – 2017-09-22 (×7): 10 mg via ORAL
  Filled 2017-09-16 (×7): qty 1

## 2017-09-16 NOTE — Progress Notes (Signed)
  Progress Note    09/16/2017 8:20 AM 8 Days Post-Op  Subjective: Having left-sided swelling upper extremity flank and left lower extremity Legs hurt with walking denies chest pain   Vitals:   09/16/17 0324 09/16/17 0729  BP: (!) 153/68 (!) 168/62  Pulse:  61  Resp: 12 11  Temp: 98.1 F (36.7 C) 97.8 F (36.6 C)  SpO2:      Physical Exam: Awake alert oriented Abdomen is soft nontender Palpable femoral pulses bilaterally She does have edema of her left upper extremity and left lower extremity that is pitting  CBC    Component Value Date/Time   WBC 10.7 (H) 09/15/2017 0304   RBC 3.70 (L) 09/15/2017 0304   HGB 10.1 (L) 09/15/2017 0304   HGB 10.3 (L) 06/22/2017 1106   HCT 32.0 (L) 09/15/2017 0304   HCT 31.8 (L) 06/22/2017 1106   PLT 291 09/15/2017 0304   PLT 304 06/22/2017 1106   MCV 86.5 09/15/2017 0304   MCV 77 (L) 06/22/2017 1106   MCH 27.3 09/15/2017 0304   MCHC 31.6 09/15/2017 0304   RDW 19.5 (H) 09/15/2017 0304   RDW 21.7 (H) 06/22/2017 1106   LYMPHSABS 1.2 09/15/2017 0304   LYMPHSABS 1.9 01/19/2017 1128   MONOABS 0.8 09/15/2017 0304   EOSABS 0.0 09/15/2017 0304   EOSABS 0.3 01/19/2017 1128   BASOSABS 0.0 09/15/2017 0304   BASOSABS 0.0 01/19/2017 1128    BMET    Component Value Date/Time   NA 135 09/16/2017 0348   NA 141 06/22/2017 1106   K 5.2 (H) 09/16/2017 0348   CL 105 09/16/2017 0348   CO2 20 (L) 09/16/2017 0348   GLUCOSE 120 (H) 09/16/2017 0348   BUN 81 (H) 09/16/2017 0348   BUN 31 (H) 06/22/2017 1106   CREATININE 4.16 (H) 09/16/2017 0348   CREATININE 1.10 06/10/2014 0813   CALCIUM 8.0 (L) 09/16/2017 0348   CALCIUM 7.6 (L) 06/09/2017 0230   GFRNONAA 10 (L) 09/16/2017 0348   GFRAA 12 (L) 09/16/2017 0348    INR    Component Value Date/Time   INR 1.09 09/07/2017 2009     Intake/Output Summary (Last 24 hours) at 09/16/2017 0820 Last data filed at 09/16/2017 0500 Gross per 24 hour  Intake 320 ml  Output 2175 ml  Net -1855 ml      Assessment:62 y.o.femaleis s/p TEVAR for acute appearing endoleak with TAA expansion  Plan: Cr stable.  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 She will f/u in 4-6 weeks with non contrasted CT to eval thoracic aneurysm      Shonta Bourque C. Donzetta Matters, MD [facility] Office: [phone] Pager: [phone]  09/16/2017 8:20 AM

## 2017-09-16 NOTE — Progress Notes (Signed)
Pt tearful; refusing dialysis as an option; prayed with pt.   [place]  Colten Desroches, RN

## 2017-09-16 NOTE — Care Management Note (Signed)
Case Management Note  Patient Details  Name: Diana Walters MRN: [id number] Date of Birth: [birth year]  Subjective/Objective:   Pt admitted with CP - s/p AAA repair, now in renal failure  - not yet determined to need HD                 Action/Plan:  PTA Independent from home.  Pts family will provide 24 hour supervision as recommended.  CM provided hardcopy choice list for HH/DME as recommended.  CM will follow back up with pt.   Expected Discharge Date:                  Expected Discharge Plan:  [facility]  In-House Referral:     Discharge planning Services  CM Consult  Post Acute Care Choice:    Choice offered to:     DME Arranged:    DME Agency:     HH Arranged:    HH Agency:     Status of Service:     If discussed at [employer], dates discussed:    Additional Comments: 09/16/2017  Unfortunately pt continues to delay picking a [facility] agency.  CM offered to call daughter however pt refused.  CM again stressed the importance of getting [facility] arranged prior to discharge - pt acknowledged the request.  Weekend CM to follow up with pt CM also requested palliative consult with attending consideration based on pts renal function and current refusal of dialysis.   09/15/2017 CM checked back with pt to get choice for [facility] agency - unfortunately pt has still not decided on [facility] agency.  CM will check back with pt  Maryclare Labrador, RN 09/16/2017, 3:46 PM

## 2017-09-16 NOTE — Progress Notes (Signed)
PT Cancellation Note  Patient Details Name: Diana Walters MRN: [id number] DOB: 09-11-1955   Cancelled Treatment:    Reason Eval/Treat Not Completed: Patient declined, no reason specified Attempted to work with patient twice today, pt declining both times. Very emotional and distressed over news of dialysis, also has had subsequently had high BP and dizziness EOB . Pt agreeable to talk with spiritual care, discussed with RN who will be following up.   Reinaldo Berber, PT, DPT Acute Rehab Services Pager: [phone]     Reinaldo Berber 09/16/2017, 11:26 AM

## 2017-09-16 NOTE — Progress Notes (Signed)
[facility] ROUNDING NOTE   Subjective:   Diuresing well now with lasix 120 IV BID + metolazone 5. Net neg 1.8L yesterday.    Discussed stable renal function with her today and no need for dialysis.   Objective:  Vital signs in last 24 hours:  Temp:  [97.7 F (36.5 C)-98.2 F (36.8 C)] 97.8 F (36.6 C) (08/02 0729) Pulse Rate:  [58-61] 61 (08/02 0729) Resp:  [9-18] 11 (08/02 0729) BP: (138-174)/(62-77) 168/62 (08/02 0729) SpO2:  [95 %-100 %] 100 % (08/01 1630) Weight:  [69.9 kg (154 lb 1.6 oz)] 69.9 kg (154 lb 1.6 oz) (08/02 0500)  Weight change: -2.2 kg (-4 lb 13.6 oz) Filed Weights   09/14/17 0743 09/15/17 0338 09/16/17 0500  Weight: 72.1 kg (158 lb 15.2 oz) 70.9 kg (156 lb 4.9 oz) 69.9 kg (154 lb 1.6 oz)   Intake/Output: I/O last 3 completed shifts: In: 740 [P.O.:740] Out: 2875 [Urine:2875]   Intake/Output this shift:  No intake/output data recorded.  Physical Exam: General: comfortably lying in bed at 45 degrees CVS- RRR, Grade 4 Sys murmur RS- CTA bilaterally  ABD- BS present soft non-distended EXT- L knee swollen,1+ pitting in upper thigh, improving  Basic Metabolic Panel: Recent Labs  Lab 09/12/17 0531 09/13/17 0320 09/14/17 0315 09/15/17 0304 09/16/17 0348  NA 137 135 136 135 135  K 5.1 4.9 4.8 5.0 5.2*  CL 107 107 107 108 105  CO2 19* 19* 18* 19* 20*  GLUCOSE 141* 129* 142* 102* 120*  BUN 57* 64* 72* 77* 81*  CREATININE 4.40* 4.49* 4.21* 4.02* 4.16*  CALCIUM 7.9* 7.6* 7.7* 7.7* 8.0*  PHOS  --  5.9*  --   --   --     Liver Function Tests: No results for input(s): AST, ALT, ALKPHOS, BILITOT, PROT, ALBUMIN in the last 168 hours. No results for input(s): LIPASE, AMYLASE in the last 168 hours. No results for input(s): AMMONIA in the last 168 hours.  CBC: Recent Labs  Lab 09/10/17 0440  09/11/17 0500 09/12/17 0531 09/13/17 0320 09/14/17 0315 09/15/17 0304  WBC 9.9  --   --  7.3 7.4 8.9 10.7*  NEUTROABS  --   --   --   --   --    --  8.6*  HGB 8.8*   < > 9.4* 9.2* 9.3* 9.8* 10.1*  HCT 27.6*   < > 30.1* 29.0* 29.4* 31.0* 32.0*  MCV 87.9  --   --  86.8 87.5 86.4 86.5  PLT 215  --   --  255 271 285 291   < > = values in this interval not displayed.    Cardiac Enzymes: No results for input(s): CKTOTAL, CKMB, CKMBINDEX, TROPONINI in the last 168 hours.  BNP: Invalid input(s): POCBNP  CBG: Recent Labs  Lab 09/15/17 0822 09/15/17 1203 09/15/17 1708 09/15/17 2122 09/16/17 0808  GLUCAP 75 100* 142* 150* 98    Microbiology: Results for orders placed or performed during the hospital encounter of 09/07/17  Surgical pcr screen     Status: None   Collection Time: 09/08/17  4:59 AM  Result Value Ref Range Status   MRSA, PCR NEGATIVE NEGATIVE Final   Staphylococcus aureus NEGATIVE NEGATIVE Final    Comment: (NOTE) The Xpert SA Assay (FDA approved for NASAL specimens in patients 80 years of age and older), is one component of a comprehensive surveillance program. It is not intended to diagnose infection nor to guide or monitor treatment. Performed at [facility], [place] [address] [place], [place] [address]   Body fluid culture     Status: None   Collection Time: 09/11/17  6:21 PM  Result Value Ref Range Status   Specimen Description KNEE  Final   Special Requests FLUID  Final   Gram Stain   Final    ABUNDANT WBC PRESENT, PREDOMINANTLY PMN NO ORGANISMS SEEN    Culture   Final    NO GROWTH 3 DAYS Performed at [facility] [address] [place], [place] [address]    Report Status 09/15/2017 FINAL  Final    Coagulation Studies: No results for input(s): LABPROT, INR in the last 72 hours.  Urinalysis: No results for input(s): COLORURINE, LABSPEC, PHURINE, GLUCOSEU, HGBUR, BILIRUBINUR, KETONESUR, PROTEINUR, UROBILINOGEN, NITRITE, LEUKOCYTESUR in the last 72 hours.  Invalid input(s): APPERANCEUR    Imaging: No results found.   Medications:   . sodium chloride    . ferric  gluconate (FERRLECIT/NULECIT) IV 125 mg (09/15/17 1138)  . furosemide 120 mg (09/15/17 2144)  . magnesium sulfate 1 - 4 g bolus IVPB     . amLODipine  5 mg Oral Daily  . darbepoetin (ARANESP) injection - NON-DIALYSIS  100 mcg Subcutaneous Q Fri-1800  . docusate sodium  100 mg Oral Daily  . famotidine  20 mg Oral Daily  . heparin  5,000 Units Subcutaneous Q8H  . insulin aspart  0-9 Units Subcutaneous TID WC  . metoprolol tartrate  12.5 mg Oral Daily  . pantoprazole  40 mg Oral Daily  . polyethylene glycol  17 g Oral BID  . predniSONE  20 mg Oral Q breakfast  . sodium chloride flush  10-40 mL Intracatheter Q12H   sodium chloride, magnesium sulfate 1 - 4 g bolus IVPB, ondansetron (ZOFRAN) IV, oxyCODONE-acetaminophen, sodium chloride flush    Assessment/ Plan:  1. Renal - CKD stage V - Renal function a bit worse than baseline in setting of contrast, recent surgery but creatinine is stable over the past 4-5 days.  She has no current indications for dialysis but is volume overloaded.  Sodium and fluid restriction.  Continue lasix 120 IV BID + metolazone 5.  I think 1-2 more days (Friday +/- Saturday) of IV diuresis then switch to PO in prep for discharge.  Hopefully barring further insults renal function remain stable or improve.  2. Hypertension/volume  - amlodipine 5mg , metoprolol 12.5 daily.  Volume up so diuresis will help.  Defer med titration to primary but happy to help if staying high.   3.  Anemia, normocytic, acute on chronic - Multifactorial with AoCKD and acute blood loss contributing this admission.  Darbopoeitin 100 given 7/27.  8/1 Hb 10.1.  Darbopoeitin was due today but in light of Hb improvement will hold for now.  Iron sat 4% on 09/07/17.  Has rec'd 2u pRBC which will help with iron deficiency.  Give nulecit 125mg  IV x 4 doses while here; ordered 09/14/17. 4. BMM: phos 5.9 on 7/30. Corr ca ok.   Will continue to follow closely.  Page with any questions or concerns. Jannifer Hick  MD   LOS: 8

## 2017-09-16 NOTE — Progress Notes (Signed)
PROGRESS NOTE    COLETTA LOCKNER  FXT:[id number] DOB: 23-Jun-1955 DOA: 09/07/2017 PCP: Renato Shin, MD    Brief Narrative:  62 year old female who presented with weakness and back pain. Does have significant past medical history for high-grade thoracic aneurysm repair 2014 ([facility]),anemia, asthma, osteoarthritis, rheumatoid arthritis, tobacco abuse, hypertension, dyslipidemia, seizures, CVA and depression. She was diagnosed with severe anemia, hemoglobin 4.6 and advised to come to the emergency room department.On further questioning reported intermittent pain between her shoulder blades for the last 2 weeks.Blood pressure 145/79, heart rate 74, temperature 98.6, respiratory 28, oxygen saturation 96%. Awake and alert, lungs clear to auscultation bilaterally, heart S1-S2 present and rhythmic, abdomen soft and nontender, 1+lower extremity edema. CT angiography of her chest showed aortic endograft extending from the ascending to the lower descending thoracic aorta. Evidence of acute endoleak at the level of the thoracic arch with active extravasation of contrast material into the native aneurysm sac causing enlargement of the native sac. Extensive hematoma in the mediastinum around the arch and descending aorta. Left pleural effusion. No evidence of aneurysm or dissection in the abdominal aorta.  Patient was admitted to the intensive care unit with a working diagnosis of acute blood loss anemia due to extensive mediastinal hematoma related to aortic aneurysm.   07/25.Vascular repair of aortic arch and descending thoracic aorta with proximal 34 x 200 mm extended into the descending aorta with 34 x 100 mm extension.  Postoperatively he has developed acute kidney injury.   Assessment & Plan:   Active Problems:   Symptomatic anemia   1.Thoracic aortic endovascular leak with acute blood loss anemia due to mediastinal hematoma.Pending placement at SNF, will need follow up with  vascular surgery as outpatient in 4 to 6 weeks with a non contrast CT to eval thoracic aneurysm. Will resume atorvastatin and will continue blood pressure control  2. AKI on CKD stage IV with hyperkalemia. Urine output over last 24 hours 2,175 ml, serum cr has plateau at 4.1 with K at 5,2 and serum bicarbonate at 20. Has improved edema, now seems to be more dependent, no clinical signs of pulmonary edema. To consider transition to oral diuretic therapy, for now on 120 mg IV bid and metolazone.   3. Anemia of chronic renal disease. On ferric gluconate and darbapoetin  3. Uncontrolled HTN. Will increase amlodipine to 10 mg and will continue metoprolol, systolic blood pressure 299 to 167 mmHg, will avoid hypotension due to AKI.   4.Hx ofCVA. Continue physical therapy.  5. RA/OA. Continue prednisone taper. Continue pain control with  Oxycodone.    6. T2DM. Continue Glucose cover and monitoring with insulin sliding scale.Capillary glucose 100, 142, 150, 98, 119. Patient tolerating po well.    DVT prophylaxis:heparin Code Status:full Family Communication:no family at the bedside Disposition Plan/ discharge barriers:SNF placement, after renal function improvement.   Consultants:  Nephrology   Vascular surgery  Procedures: 07/25.Vascular repair of aortic arch and descending thoracic aorta with proximal 34 x 200 mm extended into the descending aorta with 34 x 100 mm extension.  Antimicrobials:     Subjective: Patient is feeling better, has noted edema on dependent zones, no dyspnea or chest pain, no nausea or vomiting, has been out of bed to the chair.   Objective: Vitals:   09/16/17 0300 09/16/17 0324 09/16/17 0500 09/16/17 0729  BP:  (!) 153/68  (!) 168/62  Pulse:    61  Resp:  12  11  Temp: 98.1 F (36.7 C) 98.1 F (36.7  C)  97.8 F (36.6 C)  TempSrc: Oral Oral  Oral  SpO2:      Weight:   69.9 kg (154 lb 1.6 oz)   Height:         Intake/Output Summary (Last 24 hours) at 09/16/2017 0909 Last data filed at 09/16/2017 0500 Gross per 24 hour  Intake 320 ml  Output 2175 ml  Net -1855 ml   Filed Weights   09/14/17 0743 09/15/17 0338 09/16/17 0500  Weight: 72.1 kg (158 lb 15.2 oz) 70.9 kg (156 lb 4.9 oz) 69.9 kg (154 lb 1.6 oz)    Examination:   General: deconditioned  Neurology: Awake and alert, non focal  E ENT: mild pallor, no icterus, oral mucosa moist Cardiovascular: No JVD. S1-S2 present, rhythmic, no gallops, rubs, or murmurs. Pitting  +/++ edema at the dependent zones. Pulmonary: decreased breath sounds bilaterally at bases, adequate air movement, no wheezing, rhonchi or rales. Gastrointestinal. Abdomen with no organomegaly, non tender, no rebound or guarding Skin. No rashes Musculoskeletal: no joint deformities     Data Reviewed: I have personally reviewed following labs and imaging studies  CBC: Recent Labs  Lab 09/10/17 0440  09/11/17 0500 09/12/17 0531 09/13/17 0320 09/14/17 0315 09/15/17 0304  WBC 9.9  --   --  7.3 7.4 8.9 10.7*  NEUTROABS  --   --   --   --   --   --  8.6*  HGB 8.8*   < > 9.4* 9.2* 9.3* 9.8* 10.1*  HCT 27.6*   < > 30.1* 29.0* 29.4* 31.0* 32.0*  MCV 87.9  --   --  86.8 87.5 86.4 86.5  PLT 215  --   --  255 271 285 291   < > = values in this interval not displayed.   Basic Metabolic Panel: Recent Labs  Lab 09/12/17 0531 09/13/17 0320 09/14/17 0315 09/15/17 0304 09/16/17 0348  NA 137 135 136 135 135  K 5.1 4.9 4.8 5.0 5.2*  CL 107 107 107 108 105  CO2 19* 19* 18* 19* 20*  GLUCOSE 141* 129* 142* 102* 120*  BUN 57* 64* 72* 77* 81*  CREATININE 4.40* 4.49* 4.21* 4.02* 4.16*  CALCIUM 7.9* 7.6* 7.7* 7.7* 8.0*  PHOS  --  5.9*  --   --   --    GFR: Estimated Creatinine Clearance: 15 mL/min (A) (by C-G formula based on SCr of 4.16 mg/dL (H)). Liver Function Tests: No results for input(s): AST, ALT, ALKPHOS, BILITOT, PROT, ALBUMIN in the last 168 hours. No  results for input(s): LIPASE, AMYLASE in the last 168 hours. No results for input(s): AMMONIA in the last 168 hours. Coagulation Profile: No results for input(s): INR, PROTIME in the last 168 hours. Cardiac Enzymes: No results for input(s): CKTOTAL, CKMB, CKMBINDEX, TROPONINI in the last 168 hours. BNP (last 3 results) No results for input(s): PROBNP in the last 8760 hours. HbA1C: No results for input(s): HGBA1C in the last 72 hours. CBG: Recent Labs  Lab 09/15/17 0822 09/15/17 1203 09/15/17 1708 09/15/17 2122 09/16/17 0808  GLUCAP 75 100* 142* 150* 98   Lipid Profile: No results for input(s): CHOL, HDL, LDLCALC, TRIG, CHOLHDL, LDLDIRECT in the last 72 hours. Thyroid Function Tests: No results for input(s): TSH, T4TOTAL, FREET4, T3FREE, THYROIDAB in the last 72 hours. Anemia Panel: No results for input(s): VITAMINB12, FOLATE, FERRITIN, TIBC, IRON, RETICCTPCT in the last 72 hours.    Radiology Studies: I have reviewed all of the imaging during this hospital visit  personally     Scheduled Meds: . amLODipine  10 mg Oral Daily  . atorvastatin  40 mg Oral q1800  . docusate sodium  100 mg Oral Daily  . famotidine  20 mg Oral Daily  . heparin  5,000 Units Subcutaneous Q8H  . insulin aspart  0-9 Units Subcutaneous TID WC  . metoprolol tartrate  12.5 mg Oral Daily  . pantoprazole  40 mg Oral Daily  . polyethylene glycol  17 g Oral BID  . predniSONE  20 mg Oral Q breakfast  . sodium chloride flush  10-40 mL Intracatheter Q12H   Continuous Infusions: . sodium chloride    . ferric gluconate (FERRLECIT/NULECIT) IV 125 mg (09/15/17 1138)  . furosemide 120 mg (09/15/17 2144)  . magnesium sulfate 1 - 4 g bolus IVPB       LOS: 8 days        Kazaria Gaertner Gerome Apley, MD [facility] Pager [phone]

## 2017-09-17 ENCOUNTER — Inpatient Hospital Stay (HOSPITAL_COMMUNITY): Payer: Medicare Other

## 2017-09-17 LAB — BASIC METABOLIC PANEL
Anion gap: 11 (ref 5–15)
BUN: 90 mg/dL — AB (ref 8–23)
CALCIUM: 8 mg/dL — AB (ref 8.9–10.3)
CO2: 21 mmol/L — ABNORMAL LOW (ref 22–32)
CREATININE: 4.13 mg/dL — AB (ref 0.44–1.00)
Chloride: 104 mmol/L (ref 98–111)
GFR calc Af Amer: 12 mL/min — ABNORMAL LOW (ref >60)
GFR, EST NON AFRICAN AMERICAN: 11 mL/min — AB (ref >60)
Glucose, Bld: 92 mg/dL (ref 70–99)
Potassium: 5.6 mmol/L — ABNORMAL HIGH (ref 3.5–5.1)
SODIUM: 136 mmol/L (ref 135–145)

## 2017-09-17 LAB — GLUCOSE, CAPILLARY
GLUCOSE-CAPILLARY: 76 mg/dL (ref 70–99)
GLUCOSE-CAPILLARY: 101 mg/dL — AB (ref 70–99)
Glucose-Capillary: 136 mg/dL — ABNORMAL HIGH (ref 70–99)
Glucose-Capillary: 210 mg/dL — ABNORMAL HIGH (ref 70–99)

## 2017-09-17 MED ORDER — SODIUM ZIRCONIUM CYCLOSILICATE 10 G PO PACK
10.0000 g | PACK | Freq: Once | ORAL | Status: AC
  Administered 2017-09-17: 10 g via ORAL
  Filled 2017-09-17: qty 1

## 2017-09-17 NOTE — Plan of Care (Signed)
  Problem: Education: Goal: Knowledge of General Education information will improve Description Including pain rating scale, medication(s)/side effects and non-pharmacologic comfort measures Outcome: Progressing   Problem: Health Behavior/Discharge Planning: Goal: Ability to manage health-related needs will improve Outcome: Progressing   Problem: Clinical Measurements: Goal: Ability to maintain clinical measurements within normal limits will improve Outcome: Progressing Goal: Will remain free from infection Outcome: Progressing Goal: Cardiovascular complication will be avoided Outcome: Progressing   Problem: Nutrition: Goal: Adequate nutrition will be maintained Outcome: Progressing   Problem: Coping: Goal: Level of anxiety will decrease Outcome: Progressing   Problem: Elimination: Goal: Will not experience complications related to urinary retention Outcome: Progressing   Problem: Pain Managment: Goal: General experience of comfort will improve Outcome: Progressing   Problem: Safety: Goal: Ability to remain free from injury will improve Outcome: Progressing   Problem: Skin Integrity: Goal: Risk for impaired skin integrity will decrease Outcome: Progressing

## 2017-09-17 NOTE — Progress Notes (Signed)
[facility] ROUNDING NOTE   Subjective:   Diuresing well now with lasix 120 IV BID + metolazone 5. Net neg 1.1L yesterday.   UOP 2.1. Discussed stable renal function with her today and no need for dialysis. Nausea, no dysgeusia, +constipation.  Objective:  Vital signs in last 24 hours:  Temp:  [97.9 F (36.6 C)-98.2 F (36.8 C)] 98.1 F (36.7 C) (08/03 0741) Pulse Rate:  [61-63] 63 (08/02 1658) Resp:  [10-14] 10 (08/03 0741) BP: (141-167)/(61-68) 147/61 (08/03 0741) SpO2:  [95 %] 95 % (08/03 0741) Weight:  [69 kg (152 lb 1.9 oz)] 69 kg (152 lb 1.9 oz) (08/03 0339)  Weight change: -0.9 kg (-1 lb 15.7 oz) Filed Weights   09/15/17 0338 09/16/17 0500 09/17/17 0339  Weight: 70.9 kg (156 lb 4.9 oz) 69.9 kg (154 lb 1.6 oz) 69 kg (152 lb 1.9 oz)   Intake/Output: I/O last 3 completed shifts: In: 1370 [P.O.:1020; I.V.:10; Other:290; IV Piggyback:50] Out: 2925 [Urine:2925]   Intake/Output this shift:  No intake/output data recorded.  Physical Exam: General: comfortably lying in bed at 45 degrees CVS- RRR, Grade 4 Sys murmur, no rub RS- CTA bilaterally  ABD- BS present soft non-distended EXT- L knee swollen,1+ pitting in upper thigh, improving; LUE edema 2+ and worsening  Basic Metabolic Panel: Recent Labs  Lab 09/13/17 0320 09/14/17 0315 09/15/17 0304 09/16/17 0348 09/17/17 0331  NA 135 136 135 135 136  K 4.9 4.8 5.0 5.2* 5.6*  CL 107 107 108 105 104  CO2 19* 18* 19* 20* 21*  GLUCOSE 129* 142* 102* 120* 92  BUN 64* 72* 77* 81* 90*  CREATININE 4.49* 4.21* 4.02* 4.16* 4.13*  CALCIUM 7.6* 7.7* 7.7* 8.0* 8.0*  PHOS 5.9*  --   --   --   --     Liver Function Tests: No results for input(s): AST, ALT, ALKPHOS, BILITOT, PROT, ALBUMIN in the last 168 hours. No results for input(s): LIPASE, AMYLASE in the last 168 hours. No results for input(s): AMMONIA in the last 168 hours.  CBC: Recent Labs  Lab 09/11/17 0500 09/12/17 0531 09/13/17 0320 09/14/17 0315  09/15/17 0304  WBC  --  7.3 7.4 8.9 10.7*  NEUTROABS  --   --   --   --  8.6*  HGB 9.4* 9.2* 9.3* 9.8* 10.1*  HCT 30.1* 29.0* 29.4* 31.0* 32.0*  MCV  --  86.8 87.5 86.4 86.5  PLT  --  255 271 285 291    Cardiac Enzymes: No results for input(s): CKTOTAL, CKMB, CKMBINDEX, TROPONINI in the last 168 hours.  BNP: Invalid input(s): POCBNP  CBG: Recent Labs  Lab 09/16/17 0808 09/16/17 1207 09/16/17 1655 09/16/17 2109 09/17/17 0821  GLUCAP 98 119* 190* 174* 58    Microbiology: Results for orders placed or performed during the hospital encounter of 09/07/17  Surgical pcr screen     Status: None   Collection Time: 09/08/17  4:59 AM  Result Value Ref Range Status   MRSA, PCR NEGATIVE NEGATIVE Final   Staphylococcus aureus NEGATIVE NEGATIVE Final    Comment: (NOTE) The Xpert SA Assay (FDA approved for NASAL specimens in patients 54 years of age and older), is one component of a comprehensive surveillance program. It is not intended to diagnose infection nor to guide or monitor treatment. Performed at [facility], [place] [address], [place], [place] [address]   Body fluid culture     Status: None   Collection Time: 09/11/17  6:21 PM  Result Value Ref Range Status   Specimen Description KNEE  Final   Special Requests FLUID  Final   Gram Stain   Final    ABUNDANT WBC PRESENT, PREDOMINANTLY PMN NO ORGANISMS SEEN    Culture   Final    NO GROWTH 3 DAYS Performed at [facility], [address] [place], [place] [address]    Report Status 09/15/2017 FINAL  Final    Coagulation Studies: No results for input(s): LABPROT, INR in the last 72 hours.  Urinalysis: No results for input(s): COLORURINE, LABSPEC, PHURINE, GLUCOSEU, HGBUR, BILIRUBINUR, KETONESUR, PROTEINUR, UROBILINOGEN, NITRITE, LEUKOCYTESUR in the last 72 hours.  Invalid input(s): APPERANCEUR    Imaging: No results found.   Medications:   . sodium chloride    . ferric gluconate  (FERRLECIT/NULECIT) IV 125 mg (09/16/17 1127)  . furosemide Stopped (09/17/17 0700)  . magnesium sulfate 1 - 4 g bolus IVPB     . amLODipine  10 mg Oral Daily  . atorvastatin  40 mg Oral q1800  . docusate sodium  100 mg Oral Daily  . famotidine  20 mg Oral Daily  . heparin  5,000 Units Subcutaneous Q8H  . insulin aspart  0-9 Units Subcutaneous TID WC  . metoprolol tartrate  12.5 mg Oral Daily  . pantoprazole  40 mg Oral Daily  . polyethylene glycol  17 g Oral BID  . predniSONE  20 mg Oral Q breakfast  . sodium chloride flush  10-40 mL Intracatheter Q12H  . sodium zirconium cyclosilicate  10 g Oral Once   sodium chloride, magnesium sulfate 1 - 4 g bolus IVPB, ondansetron (ZOFRAN) IV, oxyCODONE-acetaminophen, sodium chloride flush    Assessment/ Plan:  1. Renal - CKD stage V - Renal function a bit worse than baseline in setting of contrast, recent surgery but creatinine is stable over the past 4-5 days.  She has no current indications for dialysis but is volume overloaded.  Sodium and fluid restriction.  Continue lasix 120 IV BID + metolazone 5 for today and switch to oral diuretics tomorrow (8/4).  The edema is globally improving but her LUE is worsening; spoke with hospitalist who will obtain venous dopper.  Hopefully barring further insults renal function remain stable; I would think this is her new baseline.  2. Hypertension/volume  - amlodipine 10mg , metoprolol 12.5 daily.  Volume up so diuresis will help.  Defer med titration to primary but happy to help if staying high.   3.  Anemia, normocytic, acute on chronic - Multifactorial with AoCKD and acute blood loss contributing this admission.  Darbopoeitin 100 given 7/27.  8/1 Hb 10.1.  Darbopoeitin was due 8/2 but in light of Hb improvement to 10.1 on 8/1 holding.  Iron sat 4% on 09/07/17.  Has rec'd 2u pRBC which will help with iron deficiency.  Give nulecit 125mg  IV x 4 doses while here; ordered 09/14/17. 4. BMM: phos 5.9 on 7/30. Corr ca  ok.   Will continue to follow closely.  Page with any questions or concerns. Jannifer Hick MD   LOS: 9

## 2017-09-17 NOTE — Progress Notes (Signed)
PROGRESS NOTE    Diana Walters  DXI:[id number] DOB: 1955-11-16 DOA: 09/07/2017 PCP: Renato Shin, MD    Brief Narrative:  62 year old female who presented with weakness and back pain. Does have significant past medical history for high-grade thoracic aneurysm repair 2014 ([facility]),anemia, asthma, osteoarthritis, rheumatoid arthritis, tobacco abuse, hypertension, dyslipidemia, seizures, CVA and depression. She was diagnosed with severe anemia, hemoglobin 4.6 and advised to come to the emergency room department.On further questioning reported intermittent pain between her shoulder blades for the last 2 weeks.Blood pressure 145/79, heart rate 74, temperature 98.6, respiratory 28, oxygen saturation 96%. Awake and alert, lungs clear to auscultation bilaterally, heart S1-S2 present and rhythmic, abdomen soft and nontender, 1+lower extremity edema. CT angiography of her chest showed aortic endograft extending from the ascending to the lower descending thoracic aorta. Evidence of acute endoleak at the level of the thoracic arch with active extravasation of contrast material into the native aneurysm sac causing enlargement of the native sac. Extensive hematoma in the mediastinum around the arch and descending aorta. Left pleural effusion. No evidence of aneurysm or dissection in the abdominal aorta.  Patient was admitted to the intensive care unit with a working diagnosis of acute blood loss anemia due to extensive mediastinal hematoma related to aortic aneurysm.   07/25.Vascular repair of aortic arch and descending thoracic aorta with proximal 34 x 200 mm extended into the descending aorta with 34 x 100 mm extension.  Postoperatively he has developed acute kidney injury.   Assessment & Plan:   Active Problems:   Symptomatic anemia  1.Thoracic aortic endovascular leak with acute blood loss anemia due to mediastinal hematoma.Plan for outpatient follow up in 4 to 6 weeks with a non  contrast CT to eval thoracic aneurysm. Tolerating well increase dose of amlodipine, continue statin therapy.  2. AKI on CKD stage IV with hyperkalemia.Urine output over last 24 hours 2,100 ml, serum cr has plateau at 4.13, with worsening hyperkalemia. Stable serum bicarbonate at 21. On furosemide 120 mg IV bid and metolazone. One dose of lokelma today. Pending transition to oral diuretics, patient has persistent dependent edema, possible related to hypoalbuminemia, follow on nutrition recommendations. Check LU extremity vascular US, to rule out dvt.    3. Anemia of chronic renal disease.Had ferric gluconate and darbapoetin, Hb and Hct have remained stable.   3. Uncontrolled HTN.Continue amlodipine to 10 mg plus metoprolol. Blood pressure sysolic 767 to 341 mmHg.  4.Hx ofCVA.Stable.  5. RA/OA.Discontinue steroids. Pain well controlled with analgesics.  6. T2DM.On glucose cover and monitoringwithinsulin sliding scale.Capillary glucose 119, 190, 174, 76, 136.  DVT prophylaxis:heparin Code Status:full Family Communication:no family at the bedside Disposition Plan/ discharge barriers:SNF placement, after renal function improvement.   Consultants:  Nephrology   Vascular surgery  Procedures: 07/25.Vascular repair of aortic arch and descending thoracic aorta with proximal 34 x 200 mm extended into the descending aorta with 34 x 100 mm extension.  Antimicrobials:     Subjective: Patient is feeling better, edema seems to be improving, no nausea or vomiting, has been ambulating with physical therapy.   Objective: Vitals:   09/17/17 0000 09/17/17 0339 09/17/17 0400 09/17/17 0741  BP:  (!) 153/68  (!) 147/61  Pulse:      Resp: 13 11 11 10   Temp:  98.1 F (36.7 C)  98.1 F (36.7 C)  TempSrc:  Oral  Oral  SpO2:    95%  Weight:  69 kg (152 lb 1.9 oz)    Height:  Intake/Output Summary (Last 24 hours) at 09/17/2017 1020 Last data filed at  09/17/2017 0600 Gross per 24 hour  Intake 920 ml  Output 1700 ml  Net -780 ml   Filed Weights   09/15/17 0338 09/16/17 0500 09/17/17 0339  Weight: 70.9 kg (156 lb 4.9 oz) 69.9 kg (154 lb 1.6 oz) 69 kg (152 lb 1.9 oz)    Examination:   General: Not in pain or dyspnea, deconditioned  Neurology: Awake and alert, non focal  E ENT: no pallor, no icterus, oral mucosa moist Cardiovascular: No JVD. S1-S2 present, rhythmic, no gallops, rubs, or murmurs. ++ pitting lower extremity edema. Pulmonary: positive breath sounds bilaterally, adequate air movement, no wheezing, rhonchi or rales. Gastrointestinal. Abdomen with no organomegaly, non tender, no rebound or guarding Skin. No rashes Musculoskeletal: no joint deformities     Data Reviewed: I have personally reviewed following labs and imaging studies  CBC: Recent Labs  Lab 09/11/17 0500 09/12/17 0531 09/13/17 0320 09/14/17 0315 09/15/17 0304  WBC  --  7.3 7.4 8.9 10.7*  NEUTROABS  --   --   --   --  8.6*  HGB 9.4* 9.2* 9.3* 9.8* 10.1*  HCT 30.1* 29.0* 29.4* 31.0* 32.0*  MCV  --  86.8 87.5 86.4 86.5  PLT  --  255 271 285 981   Basic Metabolic Panel: Recent Labs  Lab 09/13/17 0320 09/14/17 0315 09/15/17 0304 09/16/17 0348 09/17/17 0331  NA 135 136 135 135 136  K 4.9 4.8 5.0 5.2* 5.6*  CL 107 107 108 105 104  CO2 19* 18* 19* 20* 21*  GLUCOSE 129* 142* 102* 120* 92  BUN 64* 72* 77* 81* 90*  CREATININE 4.49* 4.21* 4.02* 4.16* 4.13*  CALCIUM 7.6* 7.7* 7.7* 8.0* 8.0*  PHOS 5.9*  --   --   --   --    GFR: Estimated Creatinine Clearance: 15.1 mL/min (A) (by C-G formula based on SCr of 4.13 mg/dL (H)). Liver Function Tests: No results for input(s): AST, ALT, ALKPHOS, BILITOT, PROT, ALBUMIN in the last 168 hours. No results for input(s): LIPASE, AMYLASE in the last 168 hours. No results for input(s): AMMONIA in the last 168 hours. Coagulation Profile: No results for input(s): INR, PROTIME in the last 168 hours. Cardiac  Enzymes: No results for input(s): CKTOTAL, CKMB, CKMBINDEX, TROPONINI in the last 168 hours. BNP (last 3 results) No results for input(s): PROBNP in the last 8760 hours. HbA1C: No results for input(s): HGBA1C in the last 72 hours. CBG: Recent Labs  Lab 09/16/17 0808 09/16/17 1207 09/16/17 1655 09/16/17 2109 09/17/17 0821  GLUCAP 98 119* 190* 174* 76   Lipid Profile: No results for input(s): CHOL, HDL, LDLCALC, TRIG, CHOLHDL, LDLDIRECT in the last 72 hours. Thyroid Function Tests: No results for input(s): TSH, T4TOTAL, FREET4, T3FREE, THYROIDAB in the last 72 hours. Anemia Panel: No results for input(s): VITAMINB12, FOLATE, FERRITIN, TIBC, IRON, RETICCTPCT in the last 72 hours.    Radiology Studies: I have reviewed all of the imaging during this hospital visit personally     Scheduled Meds: . amLODipine  10 mg Oral Daily  . atorvastatin  40 mg Oral q1800  . docusate sodium  100 mg Oral Daily  . famotidine  20 mg Oral Daily  . heparin  5,000 Units Subcutaneous Q8H  . insulin aspart  0-9 Units Subcutaneous TID WC  . metoprolol tartrate  12.5 mg Oral Daily  . pantoprazole  40 mg Oral Daily  . polyethylene glycol  17  g Oral BID  . predniSONE  20 mg Oral Q breakfast  . sodium chloride flush  10-40 mL Intracatheter Q12H  . sodium zirconium cyclosilicate  10 g Oral Once   Continuous Infusions: . sodium chloride    . ferric gluconate (FERRLECIT/NULECIT) IV 125 mg (09/16/17 1127)  . furosemide Stopped (09/17/17 0700)  . magnesium sulfate 1 - 4 g bolus IVPB       LOS: 9 days        Mauricio Gerome Apley, MD [facility] Pager [phone]

## 2017-09-18 ENCOUNTER — Inpatient Hospital Stay (HOSPITAL_COMMUNITY): Payer: Medicare Other

## 2017-09-18 DIAGNOSIS — R609 Edema, unspecified: Secondary | ICD-10-CM

## 2017-09-18 LAB — GLUCOSE, CAPILLARY
GLUCOSE-CAPILLARY: 66 mg/dL — AB (ref 70–99)
GLUCOSE-CAPILLARY: 94 mg/dL (ref 70–99)
GLUCOSE-CAPILLARY: 162 mg/dL — AB (ref 70–99)
Glucose-Capillary: 117 mg/dL — ABNORMAL HIGH (ref 70–99)
Glucose-Capillary: 143 mg/dL — ABNORMAL HIGH (ref 70–99)

## 2017-09-18 LAB — BASIC METABOLIC PANEL
Anion gap: 8 (ref 5–15)
BUN: 95 mg/dL — ABNORMAL HIGH (ref 8–23)
CHLORIDE: 104 mmol/L (ref 98–111)
CO2: 23 mmol/L (ref 22–32)
Calcium: 7.9 mg/dL — ABNORMAL LOW (ref 8.9–10.3)
Creatinine, Ser: 4.31 mg/dL — ABNORMAL HIGH (ref 0.44–1.00)
GFR calc Af Amer: 12 mL/min — ABNORMAL LOW (ref >60)
GFR, EST NON AFRICAN AMERICAN: 10 mL/min — AB (ref >60)
Glucose, Bld: 101 mg/dL — ABNORMAL HIGH (ref 70–99)
Potassium: 5.2 mmol/L — ABNORMAL HIGH (ref 3.5–5.1)
SODIUM: 135 mmol/L (ref 135–145)

## 2017-09-18 MED ORDER — FUROSEMIDE 80 MG PO TABS
160.0000 mg | ORAL_TABLET | Freq: Two times a day (BID) | ORAL | Status: DC
  Administered 2017-09-18 – 2017-09-21 (×7): 160 mg via ORAL
  Filled 2017-09-18 (×6): qty 2

## 2017-09-18 MED ORDER — SODIUM ZIRCONIUM CYCLOSILICATE 10 G PO PACK
10.0000 g | PACK | Freq: Once | ORAL | Status: AC
  Administered 2017-09-18: 10 g via ORAL
  Filled 2017-09-18: qty 1

## 2017-09-18 MED ORDER — DEXAMETHASONE SODIUM PHOSPHATE 10 MG/ML IJ SOLN
INTRAMUSCULAR | Status: AC
  Administered 2017-09-18: 10 mg
  Filled 2017-09-18: qty 1

## 2017-09-18 NOTE — Progress Notes (Signed)
VASCULAR LAB PRELIMINARY  PRELIMINARY  PRELIMINARY  PRELIMINARY  Left upper extremity venous duplex completed.    Preliminary report:  There is no obvious evidence of DVT or SVT noted in the left upper extremity.  Interstitial fluid noted throughout the left upper extremity.   Prentice Sackrider, RVT 09/18/2017, 9:16 AM

## 2017-09-18 NOTE — Progress Notes (Signed)
Initial Nutrition Assessment  DOCUMENTATION CODES:   Not applicable  INTERVENTION:  Ensure Enlive po BID, each supplement provides 350 kcal and 20 grams of protein  Discussed with Dining Services to prevent patient from getting pork products on her plate.  NUTRITION DIAGNOSIS:   Increased nutrient needs related to (AKI) as evidenced by estimated needs.  GOAL:   Patient will meet greater than or equal to 90% of their needs  MONITOR:   PO intake, Supplement acceptance  REASON FOR ASSESSMENT:   Consult Assessment of nutrition requirement/status  ASSESSMENT:   Patient with PMH T2DM, CVA, Depression, Anemia of Chronic disease, presents with thoracic aortic endovascular leak with acute blood loss anemia secondary to mediastinal hematoma and AKI on CKD IV with hyperkalemia   Spoke with Diana Walters at bedside. She reports good appetite PTA related to being on prednisone. Endorses weight gain with a UBW fluctuating between the 130s and 140s. PO intake during this admission has been good, mostly between 70-100% documented meal completion. However, she complains of repeatedly receiving bacon on her trays when she doesn't eat pork. Patient has told the cafeteria this multiple times but states this morning the bacon was touching her french toast and she did not eat breakfast. Normal PO intake at home consists of either boost or an ensure, or a something cooked like boiled or scrambled eggs and [place] bacon. For lunch she likes corned beef hash or a grilled cheese sandwich. Dinner is a cooked meal from her grandson. Discussed with the nutrition ambassador on the floor and reassured the patient that the problem would be fixed. Will continue to monitor.  Medications reviewed and include:  Colace, Insulin, Miralax Labs reviewed:  K+ 5.2, CBGs 66, 94 .  NUTRITION - FOCUSED PHYSICAL EXAM:    Most Recent Value  Orbital Region  Mild depletion  Upper Arm Region  No depletion  Thoracic and  Lumbar Region  No depletion  Buccal Region  No depletion  Temple Region  Mild depletion  Clavicle Bone Region  Mild depletion  Clavicle and Acromion Bone Region  Mild depletion  Scapular Bone Region  Mild depletion  Dorsal Hand  No depletion  Patellar Region  Moderate depletion  Anterior Thigh Region  Moderate depletion  Posterior Calf Region  Severe depletion       Diet Order:   Diet Order           Diet renal with fluid restriction Fluid restriction: 1200 mL Fluid; Room service appropriate? Yes; Fluid consistency: Thin  Diet effective now          EDUCATION NEEDS:   Education needs have been addressed  Skin:  Skin Assessment: Reviewed RN Assessment  Last BM:  09/14/2017  Height:   Ht Readings from Last 1 Encounters:  09/08/17 5\' 10"  (1.778 m)    Weight:   Wt Readings from Last 1 Encounters:  09/18/17 149 lb 0.5 oz (67.6 kg)    Ideal Body Weight:  68.18 kg  BMI:  Body mass index is 21.38 kg/m.  Estimated Nutritional Needs:   Kcal:  1700-1900 calories  Protein:  95-105 grams   Fluid:  >1.7L    Diana Walters. Diana Radford, MS, RD LDN Inpatient Clinical Dietitian Pager [phone]

## 2017-09-18 NOTE — Progress Notes (Signed)
[facility] ROUNDING NOTE   Subjective:   Interval events: Diuresed well with lasix 120 IV BID + metolazone 5. Net neg 1.5L yesterday.   UOP 2.4. Discussed stable renal function with her today and no current need for dialysis. Nausea improved, no dysgeusia. Eating full meal while we talked.  Objective:  Vital signs in last 24 hours:  Temp:  [97.9 F (36.6 C)-98.5 F (36.9 C)] 98.3 F (36.8 C) (08/04 0745) Pulse Rate:  [56-64] 56 (08/04 0745) Resp:  [12-17] 15 (08/04 0745) BP: (150-176)/(56-65) 176/65 (08/04 0745) SpO2:  [97 %-100 %] 100 % (08/04 0745) Weight:  [67.6 kg (149 lb 0.5 oz)] 67.6 kg (149 lb 0.5 oz) (08/04 0600)  Weight change: -1.4 kg (-3 lb 1.4 oz) Filed Weights   09/16/17 0500 09/17/17 0339 09/18/17 0600  Weight: 69.9 kg (154 lb 1.6 oz) 69 kg (152 lb 1.9 oz) 67.6 kg (149 lb 0.5 oz)   Intake/Output: I/O last 3 completed shifts: In: 3500 [P.O.:940; IV Piggyback:100] Out: 3200 [Urine:3200]   Intake/Output this shift:  No intake/output data recorded.  Physical Exam: General: comfortably lying in bed at 45 degrees CVS- RRR, Grade 4 Sys murmur, no rub RS- CTA bilaterally  ABD- BS present soft non-distended EXT- L knee swollen,1+ pitting in upper thigh, improving; LUE edema 2+ and worsening  Basic Metabolic Panel: Recent Labs  Lab 09/13/17 0320 09/14/17 0315 09/15/17 0304 09/16/17 0348 09/17/17 0331 09/18/17 0221  NA 135 136 135 135 136 135  K 4.9 4.8 5.0 5.2* 5.6* 5.2*  CL 107 107 108 105 104 104  CO2 19* 18* 19* 20* 21* 23  GLUCOSE 129* 142* 102* 120* 92 101*  BUN 64* 72* 77* 81* 90* 95*  CREATININE 4.49* 4.21* 4.02* 4.16* 4.13* 4.31*  CALCIUM 7.6* 7.7* 7.7* 8.0* 8.0* 7.9*  PHOS 5.9*  --   --   --   --   --     Liver Function Tests: No results for input(s): AST, ALT, ALKPHOS, BILITOT, PROT, ALBUMIN in the last 168 hours. No results for input(s): LIPASE, AMYLASE in the last 168 hours. No results for input(s): AMMONIA in the last 168  hours.  CBC: Recent Labs  Lab 09/12/17 0531 09/13/17 0320 09/14/17 0315 09/15/17 0304  WBC 7.3 7.4 8.9 10.7*  NEUTROABS  --   --   --  8.6*  HGB 9.2* 9.3* 9.8* 10.1*  HCT 29.0* 29.4* 31.0* 32.0*  MCV 86.8 87.5 86.4 86.5  PLT 255 271 285 291    Cardiac Enzymes: No results for input(s): CKTOTAL, CKMB, CKMBINDEX, TROPONINI in the last 168 hours.  BNP: Invalid input(s): POCBNP  CBG: Recent Labs  Lab 09/17/17 0821 09/17/17 1243 09/17/17 1721 09/17/17 2147 09/18/17 0818  GLUCAP 76 136* 210* 101* 20*    Microbiology: Results for orders placed or performed during the hospital encounter of 09/07/17  Surgical pcr screen     Status: None   Collection Time: 09/08/17  4:59 AM  Result Value Ref Range Status   MRSA, PCR NEGATIVE NEGATIVE Final   Staphylococcus aureus NEGATIVE NEGATIVE Final    Comment: (NOTE) The Xpert SA Assay (FDA approved for NASAL specimens in patients 15 years of age and older), is one component of a comprehensive surveillance program. It is not intended to diagnose infection nor to guide or monitor treatment. Performed at [facility], [place] [address], [place], [place] [address]   Body fluid culture     Status: None   Collection Time:  09/11/17  6:21 PM  Result Value Ref Range Status   Specimen Description KNEE  Final   Special Requests FLUID  Final   Gram Stain   Final    ABUNDANT WBC PRESENT, PREDOMINANTLY PMN NO ORGANISMS SEEN    Culture   Final    NO GROWTH 3 DAYS Performed at [facility], [address], [place], [place] [address]    Report Status 09/15/2017 FINAL  Final    Coagulation Studies: No results for input(s): LABPROT, INR in the last 72 hours.  Urinalysis: No results for input(s): COLORURINE, LABSPEC, PHURINE, GLUCOSEU, HGBUR, BILIRUBINUR, KETONESUR, PROTEINUR, UROBILINOGEN, NITRITE, LEUKOCYTESUR in the last 72 hours.  Invalid input(s): APPERANCEUR    Imaging: No results found.   Medications:   . sodium  chloride    . furosemide 120 mg (09/17/17 2207)  . magnesium sulfate 1 - 4 g bolus IVPB     . amLODipine  10 mg Oral Daily  . atorvastatin  40 mg Oral q1800  . docusate sodium  100 mg Oral Daily  . famotidine  20 mg Oral Daily  . heparin  5,000 Units Subcutaneous Q8H  . insulin aspart  0-9 Units Subcutaneous TID WC  . metoprolol tartrate  12.5 mg Oral Daily  . pantoprazole  40 mg Oral Daily  . polyethylene glycol  17 g Oral BID  . sodium chloride flush  10-40 mL Intracatheter Q12H   sodium chloride, magnesium sulfate 1 - 4 g bolus IVPB, ondansetron (ZOFRAN) IV, oxyCODONE-acetaminophen, sodium chloride flush    Assessment/ Plan:  1. Renal - CKD stage V - Renal function a bit worse than usual baseline in setting of contrast, recent surgery but creatinine is stable over the past 5-7 days.  She has no current indications for dialysis.  Her volume overload is improving with diuresis (per bed wt, down ~5.5kg from peak).  Sodium and fluid restriction.  Switch lasix 120 IV BID to furosemide 160 po BID + metolazone 5 today. Hopefully barring further insults renal function remain stable; I would think this is her new baseline.     2. Hypertension/volume  - amlodipine 10mg , metoprolol 12.5.  Volume up so diuresis will help.  Defer med titration to primary but happy to help if staying high.  Of note her LUE edema is worsening despite overall improvement in volume status.  We checked a venous doppler yesterday which was negative for clot but I think additional imaging should be considered.  She has no h/o line or port on that side but perhaps this could be related to the recent aortic stent repair? 3.  Anemia, normocytic, acute on chronic - Multifactorial with AoCKD and acute blood loss contributing this admission.  Darbopoeitin 100 given 7/27.  8/1 Hb 10.1.  Darbopoeitin was due 8/2 but in light of Hb improvement to 10.1 on 8/1 holding.  Iron sat 4% on 09/07/17.  Has rec'd 2u pRBC.  Given nulecit 125mg  IV x  4 doses while here; ordered 09/14/17. 4. BMM: phos 5.9 on 7/30. Corr ca ok. 05/2017 PTH mildly elevated consistent with secondary hyperPTH, didn't recheck this admission.  Will continue to follow closely.  Page with any questions or concerns. Jannifer Hick MD   LOS: 10

## 2017-09-18 NOTE — Progress Notes (Signed)
PROGRESS NOTE    Diana Walters  YTK:[id number] DOB: [birth year] DOA: 09/07/2017 PCP: Renato Shin, MD    Brief Narrative:  62 year old female who presented with weakness and back pain. She does have significant past medical history for high-grade thoracic aneurysm repair 2014 ([facility]),anemia, asthma, osteoarthritis, rheumatoid arthritis, tobacco abuse, hypertension, dyslipidemia, seizures, CVA, CKD and depression. She was diagnosed with severe anemia, hemoglobin 4.6 and advised to come to the emergency room department.On further questioning reported intermittent pain between her shoulder blades for the last 2 weeks.Blood pressure 145/79, heart rate 74, temperature 98.6, respiratory rate 28, oxygen saturation 96%. Awake and alert, lungs clear to auscultation bilaterally, heart S1-S2 present and rhythmic, abdomen soft and nontender, 1+lower extremity edema.  Sodium 140, potassium 4.5, chloride 1 8, bicarb 20, glucose 87, BUN 54, creatinine 3.3, troponin less than 0.03, white count 10.1, hemoglobin 5.1, hematocrit 17.5, platelets 539.  Urinalysis with 100 protein, specific gravity 1.014, 0-5 RBCs, 0-5 white cells.  Chest x-ray with hyperinflation, aortic stent graft in place with fullness around the aortic arch.  EKG normal sinus rhythm, normal axis, normal intervals, positive PACs.  CT angiography of her chest showed aortic endograft extending from the ascending to the lower descending thoracic aorta. Evidence of acute endoleak at the level of the thoracic arch with active extravasation of contrast material into the native aneurysm sac causing enlargement of the native sac. Extensive hematoma in the mediastinum around the arch and descending aorta. Left pleural effusion. No evidence of aneurysm or dissection in the abdominal aorta.  Patient was admitted to the intensive care unit with a working diagnosis of acute blood loss anemia due to extensive mediastinal hematoma related to aortic  aneurysm endoleak.   07/25.Vascular repair of aortic arch and descending thoracic aorta with proximal 34 x 200 mm extended into the descending aorta with 34 x 100 mm extension.  Postoperatively he has developed acute kidney injury.   Assessment & Plan:   Active Problems:   Symptomatic anemia   1.Thoracic aortic endovascular leak with acute blood loss anemia due to mediastinal hematoma.Outpatient follow up in 4 to 6 weeks with a non contrast CT to eval thoracic aneurysm.Continue medical therapy with amlodipine and statin.  2. AKI on CKD stage IV with hyperkalemia.Urine output over last 24 hours2,433ml, serum crhas plateau at4.3. K at 5,2, will repeat dose of lokelma, and will follow renal panel in am. Diuretic regimen has been changed to oral with furosemide 160 mg po bid. Patient will need close follow up as outpatient, will need access for renal replacement therapy.   3. Anemia of chronic renal disease.continue darbapoetin. No signs of blood loss.   3.UncontrolledHTN.On amlodipineto 10 mg plus metoprolol. Blood pressure sysolic 557  to 322 mmHg. Will continue monitoring for now.   4.Hx ofCVA.Stable, need follow up as outpatient.  5. RA/OA.Pain has remained well controlled with analgesics.  6. T2DM.Capillary glucose201, 101, 66, 94, 117. Continue insulin for glucose cover and monitoring.   Subjective: Feeling well, but persistent edema on the left side, tends to lay more on that side to prevent left knee pain. At home lives with her grandson.   Objective: Vitals:   09/17/17 2354 09/18/17 0000 09/18/17 0600 09/18/17 0745  BP: (!) 150/56   (!) 176/65  Pulse: 61   (!) 56  Resp:  12 14 15   Temp: 98.1 F (36.7 C)   98.3 F (36.8 C)  TempSrc: Oral   Oral  SpO2: 99%   100%  Weight:  67.6 kg (149 lb 0.5 oz)   Height:        Intake/Output Summary (Last 24 hours) at 09/18/2017 0909 Last data filed at 09/18/2017 0800 Gross per 24 hour  Intake 700 ml   Output 2400 ml  Net -1700 ml   Filed Weights   09/16/17 0500 09/17/17 0339 09/18/17 0600  Weight: 69.9 kg (154 lb 1.6 oz) 69 kg (152 lb 1.9 oz) 67.6 kg (149 lb 0.5 oz)    Examination:   General: Not in pain or dyspnea, deconditioned  Neurology: Awake and alert, non focal  E ENT: mild pallor, no icterus, oral mucosa moist Cardiovascular: No JVD. S1-S2 present, rhythmic, no gallops, rubs, or murmurs. Positive dependent edema at the left upper extremity. Pulmonary: vesicular breath sounds bilaterally, adequate air movement, no wheezing, rhonchi or rales. Gastrointestinal. Abdomen with, no organomegaly, non tender, no rebound or guarding Skin. No rashes Musculoskeletal: no joint deformities. Hypertrophic left knee, not tender to palpation, no local erythema.      Data Reviewed: I have personally reviewed following labs and imaging studies  CBC: Recent Labs  Lab 09/12/17 0531 09/13/17 0320 09/14/17 0315 09/15/17 0304  WBC 7.3 7.4 8.9 10.7*  NEUTROABS  --   --   --  8.6*  HGB 9.2* 9.3* 9.8* 10.1*  HCT 29.0* 29.4* 31.0* 32.0*  MCV 86.8 87.5 86.4 86.5  PLT 255 271 285 222   Basic Metabolic Panel: Recent Labs  Lab 09/13/17 0320 09/14/17 0315 09/15/17 0304 09/16/17 0348 09/17/17 0331 09/18/17 0221  NA 135 136 135 135 136 135  K 4.9 4.8 5.0 5.2* 5.6* 5.2*  CL 107 107 108 105 104 104  CO2 19* 18* 19* 20* 21* 23  GLUCOSE 129* 142* 102* 120* 92 101*  BUN 64* 72* 77* 81* 90* 95*  CREATININE 4.49* 4.21* 4.02* 4.16* 4.13* 4.31*  CALCIUM 7.6* 7.7* 7.7* 8.0* 8.0* 7.9*  PHOS 5.9*  --   --   --   --   --    GFR: Estimated Creatinine Clearance: 14.3 mL/min (A) (by C-G formula based on SCr of 4.31 mg/dL (H)). Liver Function Tests: No results for input(s): AST, ALT, ALKPHOS, BILITOT, PROT, ALBUMIN in the last 168 hours. No results for input(s): LIPASE, AMYLASE in the last 168 hours. No results for input(s): AMMONIA in the last 168 hours. Coagulation Profile: No results for  input(s): INR, PROTIME in the last 168 hours. Cardiac Enzymes: No results for input(s): CKTOTAL, CKMB, CKMBINDEX, TROPONINI in the last 168 hours. BNP (last 3 results) No results for input(s): PROBNP in the last 8760 hours. HbA1C: No results for input(s): HGBA1C in the last 72 hours. CBG: Recent Labs  Lab 09/17/17 0821 09/17/17 1243 09/17/17 1721 09/17/17 2147 09/18/17 0818  GLUCAP 76 136* 210* 101* 66*   Lipid Profile: No results for input(s): CHOL, HDL, LDLCALC, TRIG, CHOLHDL, LDLDIRECT in the last 72 hours. Thyroid Function Tests: No results for input(s): TSH, T4TOTAL, FREET4, T3FREE, THYROIDAB in the last 72 hours. Anemia Panel: No results for input(s): VITAMINB12, FOLATE, FERRITIN, TIBC, IRON, RETICCTPCT in the last 72 hours.    Radiology Studies: I have reviewed all of the imaging during this hospital visit personally     Scheduled Meds: . amLODipine  10 mg Oral Daily  . atorvastatin  40 mg Oral q1800  . docusate sodium  100 mg Oral Daily  . famotidine  20 mg Oral Daily  . furosemide  160 mg Oral BID  . heparin  5,000 Units  Subcutaneous Q8H  . insulin aspart  0-9 Units Subcutaneous TID WC  . metoprolol tartrate  12.5 mg Oral Daily  . pantoprazole  40 mg Oral Daily  . polyethylene glycol  17 g Oral BID  . sodium chloride flush  10-40 mL Intracatheter Q12H  . sodium zirconium cyclosilicate  10 g Oral Once   Continuous Infusions: . sodium chloride    . magnesium sulfate 1 - 4 g bolus IVPB       LOS: 10 days        Diana Walters Gerome Apley, MD [facility] Pager [phone]

## 2017-09-19 LAB — GLUCOSE, CAPILLARY
GLUCOSE-CAPILLARY: 110 mg/dL — AB (ref 70–99)
GLUCOSE-CAPILLARY: 184 mg/dL — AB (ref 70–99)
Glucose-Capillary: 126 mg/dL — ABNORMAL HIGH (ref 70–99)
Glucose-Capillary: 101 mg/dL — ABNORMAL HIGH (ref 70–99)

## 2017-09-19 LAB — BASIC METABOLIC PANEL
Anion gap: 11 (ref 5–15)
BUN: 102 mg/dL — AB (ref 8–23)
CO2: 21 mmol/L — ABNORMAL LOW (ref 22–32)
CREATININE: 4.35 mg/dL — AB (ref 0.44–1.00)
Calcium: 8.1 mg/dL — ABNORMAL LOW (ref 8.9–10.3)
Chloride: 102 mmol/L (ref 98–111)
GFR calc Af Amer: 11 mL/min — ABNORMAL LOW (ref >60)
GFR calc non Af Amer: 10 mL/min — ABNORMAL LOW (ref >60)
GLUCOSE: 126 mg/dL — AB (ref 70–99)
POTASSIUM: 6.2 mmol/L — AB (ref 3.5–5.1)
SODIUM: 134 mmol/L — AB (ref 135–145)

## 2017-09-19 MED ORDER — SODIUM ZIRCONIUM CYCLOSILICATE 10 G PO PACK
10.0000 g | PACK | Freq: Three times a day (TID) | ORAL | Status: DC
  Filled 2017-09-19: qty 1

## 2017-09-19 MED ORDER — PATIROMER SORBITEX CALCIUM 8.4 G PO PACK
8.4000 g | PACK | Freq: Every day | ORAL | Status: DC
  Administered 2017-09-19 – 2017-09-21 (×3): 8.4 g via ORAL
  Filled 2017-09-19 (×5): qty 1

## 2017-09-19 MED ORDER — HYDRALAZINE HCL 50 MG PO TABS
50.0000 mg | ORAL_TABLET | Freq: Two times a day (BID) | ORAL | Status: DC
  Administered 2017-09-19 – 2017-09-21 (×5): 50 mg via ORAL
  Filled 2017-09-19 (×5): qty 1

## 2017-09-19 MED ORDER — SODIUM ZIRCONIUM CYCLOSILICATE 10 G PO PACK
10.0000 g | PACK | Freq: Two times a day (BID) | ORAL | Status: DC
  Administered 2017-09-19: 10 g via ORAL
  Filled 2017-09-19: qty 1

## 2017-09-19 NOTE — Progress Notes (Signed)
PROGRESS NOTE    OTA EBERSOLE  KXF:[id number] DOB: 11-16-1955 DOA: 09/07/2017 PCP: Renato Shin, MD    Brief Narrative:  62 year old female who presented with weakness and back pain. She does have significant past medical history for high-grade thoracic aneurysm repair 2014 ([facility]),anemia, asthma, osteoarthritis, rheumatoid arthritis, tobacco abuse, hypertension, dyslipidemia, seizures, CVA, CKD and depression. She was diagnosed with severe anemia, hemoglobin 4.6 and advised to come to the emergency room department.On further questioning reported intermittent pain between her shoulder blades for the last 2 weeks.Blood pressure 145/79, heart rate 74, temperature 98.6, respiratory rate 28, oxygen saturation 96%. Awake and alert, lungs clear to auscultation bilaterally, heart S1-S2 present and rhythmic, abdomen soft and nontender, 1+lower extremity edema.  Sodium 140, potassium 4.5, chloride 1 8, bicarb 20, glucose 87, BUN 54, creatinine 3.3, troponin less than 0.03, white count 10.1, hemoglobin 5.1, hematocrit 17.5, platelets 539.  Urinalysis with 100 protein, specific gravity 1.014, 0-5 RBCs, 0-5 white cells.  Chest x-ray with hyperinflation, aortic stent graft in place with fullness around the aortic arch.  EKG normal sinus rhythm, normal axis, normal intervals, positive PACs.  CT angiography of her chest showed aortic endograft extending from the ascending to the lower descending thoracic aorta. Evidence of acute endoleak at the level of the thoracic arch with active extravasation of contrast material into the native aneurysm sac causing enlargement of the native sac. Extensive hematoma in the mediastinum around the arch and descending aorta. Left pleural effusion. No evidence of aneurysm or dissection in the abdominal aorta.  Patient was admitted to the intensive care unit with a working diagnosis of acute blood loss anemia due to extensive mediastinal hematoma related to aortic  aneurysm endoleak.   07/25.Vascular repair of aortic arch and descending thoracic aorta with proximal 34 x 200 mm extended into the descending aorta with 34 x 100 mm extension.  Postoperatively he has developed acute kidney injury.  Assessment & Plan:   Active Problems:   Symptomatic anemia   1.Thoracic aortic endovascular leak with acute blood loss anemia due to mediastinal hematoma.Plan for outpatient follow upin 4 to 6 weeks with a non contrast CT to eval thoracic aneurysm. Elevated blood pressure, will need better control, continue statin therapy.  2. AKI on CKD stage IV with hyperkalemia.Urine output over last 24 hours1,915ml, serum crhas remained plateau at4.3. But worsenung hyperkalemia will repeat 2 dose of lokelma, and continue to follow renal panel in am. Currently on furosemide 120 mg po bid. Persistent edema, likely due to hypoproteinemia.   3. Anemia of chronic renal disease.On darbapoetin. Traget Hb 11.    3.UncontrolledHTN.Blood pressure 160 to 170 on 10 mg of amlodipine and 12,5 mg of bid metoprolol. Heart rate 55 to 65, not able to increase b blocker due to risk of bradycardia, will add hydralazine 50 mg po bid.   4.Hx ofCVA.will order home pt at discharge.  5. RA/OA.Off prednisone, continue pain control.  6. T2DM.Capillary glucose143, 162, 110, 126. On insulin for glucose cover and monitoring. Patient tolerating po well.    DVT prophylaxis: scd  Code Status:  full Family Communication: no family at the bedside  Disposition Plan/ discharge barriers: pending improvement hyperkalemia and stabilization of renal function.    Consultants:   Nephrology   Procedures:     Antimicrobials:       Subjective: Patient has persistent edema on the left side, no chest pain or dyspnea, no nausea or vomiting, no chest pain.   Objective: Vitals:   09/19/17  0000 09/19/17 0600 09/19/17 0738 09/19/17 0908  BP:  (!) 151/73 (!) 164/57     Pulse:   (!) 57 65  Resp: 13 11 14    Temp:  97.9 F (36.6 C) 97.6 F (36.4 C)   TempSrc:  Oral Oral   SpO2:   100%   Weight:  66.7 kg (147 lb 0.8 oz)    Height:        Intake/Output Summary (Last 24 hours) at 09/19/2017 0910 Last data filed at 09/19/2017 0600 Gross per 24 hour  Intake 1080 ml  Output 1900 ml  Net -820 ml   Filed Weights   09/17/17 0339 09/18/17 0600 09/19/17 0600  Weight: 69 kg (152 lb 1.9 oz) 67.6 kg (149 lb 0.5 oz) 66.7 kg (147 lb 0.8 oz)    Examination:   General: Not in pain or dyspnea, deconditioned  Neurology: Awake and alert, non focal  E ENT: mild pallor, no icterus, oral mucosa moist Cardiovascular: No JVD. S1-S2 present, rhythmic, no gallops, rubs, or murmurs. Dependent edema on the left upper  extremity edema. Pulmonary: positive breath sounds bilaterally, adequate air movement, no wheezing, rhonchi or rales. Gastrointestinal. Abdomen flat, no organomegaly, non tender, no rebound or guarding Skin. No rashes Musculoskeletal: no joint deformities     Data Reviewed: I have personally reviewed following labs and imaging studies  CBC: Recent Labs  Lab 09/13/17 0320 09/14/17 0315 09/15/17 0304  WBC 7.4 8.9 10.7*  NEUTROABS  --   --  8.6*  HGB 9.3* 9.8* 10.1*  HCT 29.4* 31.0* 32.0*  MCV 87.5 86.4 86.5  PLT 271 285 562   Basic Metabolic Panel: Recent Labs  Lab 09/13/17 0320  09/15/17 0304 09/16/17 0348 09/17/17 0331 09/18/17 0221 09/19/17 0229  NA 135   < > 135 135 136 135 134*  K 4.9   < > 5.0 5.2* 5.6* 5.2* 6.2*  CL 107   < > 108 105 104 104 102  CO2 19*   < > 19* 20* 21* 23 21*  GLUCOSE 129*   < > 102* 120* 92 101* 126*  BUN 64*   < > 77* 81* 90* 95* 102*  CREATININE 4.49*   < > 4.02* 4.16* 4.13* 4.31* 4.35*  CALCIUM 7.6*   < > 7.7* 8.0* 8.0* 7.9* 8.1*  PHOS 5.9*  --   --   --   --   --   --    < > = values in this interval not displayed.   GFR: Estimated Creatinine Clearance: 13.9 mL/min (A) (by C-G formula based on SCr of  4.35 mg/dL (H)). Liver Function Tests: No results for input(s): AST, ALT, ALKPHOS, BILITOT, PROT, ALBUMIN in the last 168 hours. No results for input(s): LIPASE, AMYLASE in the last 168 hours. No results for input(s): AMMONIA in the last 168 hours. Coagulation Profile: No results for input(s): INR, PROTIME in the last 168 hours. Cardiac Enzymes: No results for input(s): CKTOTAL, CKMB, CKMBINDEX, TROPONINI in the last 168 hours. BNP (last 3 results) No results for input(s): PROBNP in the last 8760 hours. HbA1C: No results for input(s): HGBA1C in the last 72 hours. CBG: Recent Labs  Lab 09/18/17 0954 09/18/17 1217 09/18/17 1715 09/18/17 2135 09/19/17 0806  GLUCAP 94 117* 143* 162* 110*   Lipid Profile: No results for input(s): CHOL, HDL, LDLCALC, TRIG, CHOLHDL, LDLDIRECT in the last 72 hours. Thyroid Function Tests: No results for input(s): TSH, T4TOTAL, FREET4, T3FREE, THYROIDAB in the last 72 hours. Anemia Panel:  No results for input(s): VITAMINB12, FOLATE, FERRITIN, TIBC, IRON, RETICCTPCT in the last 72 hours.    Radiology Studies: I have reviewed all of the imaging during this hospital visit personally     Scheduled Meds: . amLODipine  10 mg Oral Daily  . atorvastatin  40 mg Oral q1800  . docusate sodium  100 mg Oral Daily  . famotidine  20 mg Oral Daily  . furosemide  160 mg Oral BID  . heparin  5,000 Units Subcutaneous Q8H  . insulin aspart  0-9 Units Subcutaneous TID WC  . metoprolol tartrate  12.5 mg Oral Daily  . pantoprazole  40 mg Oral Daily  . polyethylene glycol  17 g Oral BID  . sodium chloride flush  10-40 mL Intracatheter Q12H  . sodium zirconium cyclosilicate  10 g Oral BID   Continuous Infusions: . sodium chloride    . magnesium sulfate 1 - 4 g bolus IVPB       LOS: 11 days        Linnette Panella Gerome Apley, MD [facility] Pager [phone]

## 2017-09-19 NOTE — Progress Notes (Signed)
Physical Therapy Treatment Patient Details Name: Diana Walters MRN: [id number] DOB: [birth year] Today's Date: 09/19/2017    History of Present Illness 62 y.o. female s/p TEVAR 7/25. Pt also presenting with severe L knee pain (per pt report due to RA and lack of being able to take her RA meds). PMH includes: CVA, HTN, Seizure, AKI thoracic aortic aneurysm, RA    PT Comments    Pt is self limiting due to significant L knee pain (pt reports significant RA and has been off her RA meds for 2 days due to medical reasons) and only agreeable to OOB to the recliner chair.  I am worried about her ability to walk and get up the stairs into her home.  PT will attempt to see tomorrow as well.     Follow Up Recommendations  Home health PT;Supervision/Assistance - 24 hour     Equipment Recommendations  Rolling walker with 5" wheels;3in1 (PT)    Recommendations for Other Services   NA     Precautions / Restrictions Precautions Precautions: Fall Restrictions Weight Bearing Restrictions: No    Mobility  Bed Mobility Overal bed mobility: Needs Assistance Bed Mobility: Supine to Sit     Supine to sit: Supervision;HOB elevated     General bed mobility comments: Supervision for safety, pt able to move L leg to EOB with her hands, relies heavily on HOB elevated and bed rail for leverage.   Transfers Overall transfer level: Needs assistance Equipment used: 1 person hand held assist Transfers: Sit to/from [facility] Sit to Stand: Mod assist Stand pivot transfers: Mod assist       General transfer comment: mod assist to stand and pivot to the chair as pt declined wanting to walk at this time due to general fatigue and L knee pain.                          Balance Overall balance assessment: Needs assistance Sitting-balance support: Feet supported;Bilateral upper extremity supported;No upper extremity supported Sitting balance-Leahy Scale: Good     Standing  balance support: Bilateral upper extremity supported Standing balance-Leahy Scale: Poor                              Cognition Arousal/Alertness: Awake/alert Behavior During Therapy: WFL for tasks assessed/performed Overall Cognitive Status: Within Functional Limits for tasks assessed                                               Pertinent Vitals/Pain Pain Assessment: Faces Faces Pain Scale: Hurts whole lot Pain Location: L knee Pain Descriptors / Indicators: Crying;Grimacing;Pressure;Shooting Pain Intervention(s): Limited activity within patient's tolerance;Monitored during session;Repositioned;Heat applied       Prior Function            PT Goals (current goals can now be found in the care plan section) Acute Rehab PT Goals Patient Stated Goal: to get back home, decrease left knee pain.  Progress towards PT goals: Progressing toward goals    Frequency    Min 3X/week      PT Plan Current plan remains appropriate       AM-PAC PT "6 Clicks" Daily Activity  Outcome Measure  Difficulty turning over in bed (including adjusting bedclothes, sheets and blankets)?: A Little Difficulty  moving from lying on back to sitting on the side of the bed? : A Little Difficulty sitting down on and standing up from a chair with arms (e.g., wheelchair, bedside commode, etc,.)?: Unable Help needed moving to and from a bed to chair (including a wheelchair)?: A Lot Help needed walking in hospital room?: A Lot Help needed climbing 3-5 steps with a railing? : Total 6 Click Score: 12    End of Session   Activity Tolerance: Patient limited by pain;Patient limited by fatigue Patient left: in chair;with nursing/sitter in room   PT Visit Diagnosis: Unsteadiness on feet (R26.81);Pain;Difficulty in walking, not elsewhere classified (R26.2) Pain - Right/Left: Left Pain - part of body: Knee     Time: 0110-0349 PT Time Calculation (min) (ACUTE ONLY): 37  min  Charges:  $Therapeutic Activity: 23-37 mins                     09/19/2017, 11:18 PM

## 2017-09-19 NOTE — Progress Notes (Signed)
  Progress Note    09/19/2017 11:37 AM 11 Days Post-Op  Subjective: No acute vascular issues Chest pain has resolved  Vitals:   09/19/17 0738 09/19/17 0908  BP: (!) 164/57   Pulse: (!) 57 65  Resp: 14   Temp: 97.6 F (36.4 C)   SpO2: 100%     Physical Exam: Awake alert oriented Nonlabored respirations Abdomen is soft Palpable femoral pulses bilaterally Feet are warm and well-perfused CBC    Component Value Date/Time   WBC 10.7 (H) 09/15/2017 0304   RBC 3.70 (L) 09/15/2017 0304   HGB 10.1 (L) 09/15/2017 0304   HGB 10.3 (L) 06/22/2017 1106   HCT 32.0 (L) 09/15/2017 0304   HCT 31.8 (L) 06/22/2017 1106   PLT 291 09/15/2017 0304   PLT 304 06/22/2017 1106   MCV 86.5 09/15/2017 0304   MCV 77 (L) 06/22/2017 1106   MCH 27.3 09/15/2017 0304   MCHC 31.6 09/15/2017 0304   RDW 19.5 (H) 09/15/2017 0304   RDW 21.7 (H) 06/22/2017 1106   LYMPHSABS 1.2 09/15/2017 0304   LYMPHSABS 1.9 01/19/2017 1128   MONOABS 0.8 09/15/2017 0304   EOSABS 0.0 09/15/2017 0304   EOSABS 0.3 01/19/2017 1128   BASOSABS 0.0 09/15/2017 0304   BASOSABS 0.0 01/19/2017 1128    BMET    Component Value Date/Time   NA 134 (L) 09/19/2017 0229   NA 141 06/22/2017 1106   K 6.2 (H) 09/19/2017 0229   CL 102 09/19/2017 0229   CO2 21 (L) 09/19/2017 0229   GLUCOSE 126 (H) 09/19/2017 0229   BUN 102 (H) 09/19/2017 0229   BUN 31 (H) 06/22/2017 1106   CREATININE 4.35 (H) 09/19/2017 0229   CREATININE 1.10 06/10/2014 0813   CALCIUM 8.1 (L) 09/19/2017 0229   CALCIUM 7.6 (L) 06/09/2017 0230   GFRNONAA 10 (L) 09/19/2017 0229   GFRAA 11 (L) 09/19/2017 0229    INR    Component Value Date/Time   INR 1.09 09/07/2017 2009     Intake/Output Summary (Last 24 hours) at 09/19/2017 1137 Last data filed at 09/19/2017 0600 Gross per 24 hour  Intake 840 ml  Output 1500 ml  Net -660 ml     Assessment:62 y.o.femaleis s/p TEVAR for acute appearing endoleak with TAA expansion  Plan: If she ultimately does  need dialysis access she would need permanent access in her right upper extremity given that her left subclavian artery is not in-line flow and the patient is ambidextrous.  She has had vein mapping in May which demonstrate possible suitable basilic vein on the right for access.  If she does need this performed prior to discharge we can place a tunneled catheter and consider fistula versus graft at that time.   Keddrick Wyne C. Donzetta Matters, MD [facility] Office: [phone] Pager: [phone]  09/19/2017 11:37 AM

## 2017-09-19 NOTE — Progress Notes (Signed)
Admit: 09/07/2017 LOS: 11  46F CKD5 (sees [place]) admitted with endoleak of TAA s/p repair 7/25.    Subjective:  Marland Kitchen Down 11lb from peak weight on lasix 160 PO BID + Metolazone  . TRH started Lokelma 10gm BID for her high K, was 6.2 this AM . 1.9 L UOP yesterday, neg eng 0.7; still positive from admissino] . Stable SCr . Discuss permanent access, generally; was upsetting  08/04 0701 - 08/05 0700 In: 1130 [P.O.:1080; IV Piggyback:50] Out: 1900 [IPJAS:5053]  Filed Weights   09/17/17 0339 09/18/17 0600 09/19/17 0600  Weight: 69 kg (152 lb 1.9 oz) 67.6 kg (149 lb 0.5 oz) 66.7 kg (147 lb 0.8 oz)    Scheduled Meds: . amLODipine  10 mg Oral Daily  . atorvastatin  40 mg Oral q1800  . docusate sodium  100 mg Oral Daily  . famotidine  20 mg Oral Daily  . furosemide  160 mg Oral BID  . heparin  5,000 Units Subcutaneous Q8H  . insulin aspart  0-9 Units Subcutaneous TID WC  . metoprolol tartrate  12.5 mg Oral Daily  . pantoprazole  40 mg Oral Daily  . polyethylene glycol  17 g Oral BID  . sodium chloride flush  10-40 mL Intracatheter Q12H  . sodium zirconium cyclosilicate  10 g Oral BID   Continuous Infusions: . sodium chloride    . magnesium sulfate 1 - 4 g bolus IVPB     PRN Meds:.sodium chloride, magnesium sulfate 1 - 4 g bolus IVPB, ondansetron (ZOFRAN) IV, oxyCODONE-acetaminophen, sodium chloride flush  Current Labs: reviewed    Physical Exam:  Blood pressure (!) 164/57, pulse 65, temperature 97.6 F (36.4 C), temperature source Oral, resp. rate 14, height 5\' 10"  (1.778 m), weight 66.7 kg (147 lb 0.8 oz), SpO2 100 %.  NAD 15deg in bed breathing normally 3/6 MSM best at RUSB, normal rate and rhythm CTAB NO LEE  A 1. AoCKD now stage 5; stable SCr/BUN 2. Hyperkalemia, Mild; ? If from resolution of clot 3. 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 HTN, not controlled 5. Hx/o CVA 6. DM2  P . Change ot Veltassa 8.4gm daily for K control . Cont lasix, hold  metolazone . Cont to dialogue about permanent access .  Marland Kitchen Medication Issues; o Preferred narcotic agents for pain control are hydromorphone, fentanyl, and methadone. Morphine should not be used.  o Baclofen should be avoided o Avoid oral sodium phosphate and magnesium citrate based laxatives / bowel preps    Pearson Grippe MD 09/19/2017, 10:23 AM  Recent Labs  Lab 09/13/17 0320  09/17/17 0331 09/18/17 0221 09/19/17 0229  NA 135   < > 136 135 134*  K 4.9   < > 5.6* 5.2* 6.2*  CL 107   < > 104 104 102  CO2 19*   < > 21* 23 21*  GLUCOSE 129*   < > 92 101* 126*  BUN 64*   < > 90* 95* 102*  CREATININE 4.49*   < > 4.13* 4.31* 4.35*  CALCIUM 7.6*   < > 8.0* 7.9* 8.1*  PHOS 5.9*  --   --   --   --    < > = values in this interval not displayed.   Recent Labs  Lab 09/13/17 0320 09/14/17 0315 09/15/17 0304  WBC 7.4 8.9 10.7*  NEUTROABS  --   --  8.6*  HGB 9.3* 9.8* 10.1*  HCT 29.4* 31.0* 32.0*  MCV 87.5 86.4 86.5  PLT 271 285 291

## 2017-09-20 DIAGNOSIS — N185 Chronic kidney disease, stage 5: Secondary | ICD-10-CM

## 2017-09-20 LAB — BASIC METABOLIC PANEL
ANION GAP: 12 (ref 5–15)
BUN: 111 mg/dL — AB (ref 8–23)
CHLORIDE: 100 mmol/L (ref 98–111)
CO2: 21 mmol/L — ABNORMAL LOW (ref 22–32)
Calcium: 7.8 mg/dL — ABNORMAL LOW (ref 8.9–10.3)
Creatinine, Ser: 4.57 mg/dL — ABNORMAL HIGH (ref 0.44–1.00)
GFR calc Af Amer: 11 mL/min — ABNORMAL LOW (ref >60)
GFR, EST NON AFRICAN AMERICAN: 9 mL/min — AB (ref >60)
Glucose, Bld: 120 mg/dL — ABNORMAL HIGH (ref 70–99)
POTASSIUM: 4.6 mmol/L (ref 3.5–5.1)
SODIUM: 133 mmol/L — AB (ref 135–145)

## 2017-09-20 LAB — GLUCOSE, CAPILLARY
GLUCOSE-CAPILLARY: 94 mg/dL (ref 70–99)
GLUCOSE-CAPILLARY: 98 mg/dL (ref 70–99)
GLUCOSE-CAPILLARY: 135 mg/dL — AB (ref 70–99)
Glucose-Capillary: 167 mg/dL — ABNORMAL HIGH (ref 70–99)

## 2017-09-20 LAB — MAGNESIUM: Magnesium: 2 mg/dL (ref 1.7–2.4)

## 2017-09-20 NOTE — Progress Notes (Addendum)
PROGRESS NOTE    DORSIE SETHI  JTT:[id number] DOB: 19-Nov-1955 DOA: 09/07/2017 PCP: Renato Shin, MD    Brief Narrative:  62 year old female who presented with weakness and back pain. She does have significant past medical history for high-grade thoracic aneurysm repair 2014 ([facility]),anemia, asthma, osteoarthritis, rheumatoid arthritis, tobacco abuse, hypertension, dyslipidemia, seizures, CVA, CKDand depression. She was diagnosed with severe anemia, hemoglobin 4.6 and advised to come to the emergency room department.On further questioning reported intermittent pain between her shoulder blades for the last 2 weeks.Blood pressure 145/79, heart rate 74, temperature 98.6, respiratory rate28, oxygen saturation 96%. Awake and alert, lungs clear to auscultation bilaterally, heart S1-S2 present and rhythmic, abdomen soft and nontender, 1+lower extremity edema. Sodium 140, potassium 4.5, chloride 1 8, bicarb 20, glucose 87, BUN 54, creatinine 3.3,troponin less than 0.03,white count 10.1, hemoglobin 5.1, hematocrit 17.5, platelets 539.Urinalysis with 100 protein, specific gravity 1.014, 0-5 RBCs, 0-5 white cells. Chest x-ray with hyperinflation, aortic stent graft in place with fullness around the aortic arch. EKG normal sinus rhythm, normal axis, normal intervals, positive PACs.  CT angiography of her chest showed aortic endograft extending from the ascending to the lower descending thoracic aorta. Evidence of acute endoleak at the level of the thoracic arch with active extravasation of contrast material into the native aneurysm sac causing enlargement of the native sac. Extensive hematoma in the mediastinum around the arch and descending aorta. Left pleural effusion. No evidence of aneurysm or dissection in the abdominal aorta.  Patient was admitted to the intensive care unit with a working diagnosis of acute blood loss anemia due to extensive mediastinal hematoma related to aortic  aneurysmendoleak.   07/25.Vascular repair of aortic arch and descending thoracic aorta with proximal 34 x 200 mm extended into the descending aorta with 34 x 100 mm extension.  Postoperatively he has developed acute kidney injury.   Assessment & Plan:   Active Problems:   Symptomatic anemia  1.Thoracic aortic endovascular leak with acute blood loss anemia due to mediastinal hematoma.Patient has remained stable, chest pain free will need outpatient follow upin 4 to 6 weeks with a non contrast CT to eval thoracic aneurysm/ if patient on HD then may be able to perform contrast study. Continue statin and blood pressure control.  2. 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 K down to 4,6 and serum bicarbonate at 21, continue to hypervolemia with dependent edema. May need HD access on this admission before discharge.    3. Anemia of chronic renal disease.Continue darbapoetin. Will check cell count in am.   3.UncontrolledHTN.Improved blood pressure 140 to 009 mmHg systolic, will continue blood pressure control with metoprolol, hydralazine and amlodipine. Patient on high dose of furosemide. Hold on ace inh due to risk of worsening renal function.   4.Hx ofCVA.Plan for home health with home pt at discharge.   5. RA/OA. Pain seems to be well controlled.   6. T2DM.Capillary glucose126, 101, 184, 94, 167. continue with insulin sliding scale for glucose cover and monitoring.Patient tolerating po well.    DVT prophylaxis: scd  Code Status:  full Family Communication: no family at the bedside  Disposition Plan/ discharge barriers: pending improvement of renal function, may need renal replacement therapy on this admission.   Consultants:   Nephrology   Procedures:     Antimicrobials:      Subjective: Patient deconditioned and weak, positive edema on the left arm and left  leg,  persistent, no nausea or vomiting, no chest pain or dyspnea.   Objective: Vitals:   09/20/17 0300 09/20/17 0723 09/20/17 0939 09/20/17 1129  BP: 101/69 (!) 155/62  (!) 144/55  Pulse: (!) 58 (!) 56 66 69  Resp: 16 14  12   Temp: 97.9 F (36.6 C) 97.8 F (36.6 C)  98 F (36.7 C)  TempSrc: Oral Oral  Oral  SpO2: 98% 93%  100%  Weight: 69.1 kg (152 lb 5.4 oz)     Height:        Intake/Output Summary (Last 24 hours) at 09/20/2017 1558 Last data filed at 09/19/2017 2300 Gross per 24 hour  Intake 490 ml  Output 700 ml  Net -210 ml   Filed Weights   09/18/17 0600 09/19/17 0600 09/20/17 0300  Weight: 67.6 kg (149 lb 0.5 oz) 66.7 kg (147 lb 0.8 oz) 69.1 kg (152 lb 5.4 oz)    Examination:   General: Not in pain or dyspnea, deconditioned and ill looking appearing  Neurology: Awake and alert, non focal  E ENT: mild pallor, no icterus, oral mucosa moist Cardiovascular: No JVD. S1-S2 present, rhythmic, no gallops, rubs, or murmurs. ++/+++ lower extremity edema, predominantly dependant zones. Pulmonary: decreased breath sounds bilaterally at bases, no wheezing, rhonchi or rales. Gastrointestinal. Abdomen with, no organomegaly, non tender, no rebound or guarding Skin. No rashes Musculoskeletal: no joint deformities     Data Reviewed: I have personally reviewed following labs and imaging studies  CBC: Recent Labs  Lab 09/14/17 0315 09/15/17 0304  WBC 8.9 10.7*  NEUTROABS  --  8.6*  HGB 9.8* 10.1*  HCT 31.0* 32.0*  MCV 86.4 86.5  PLT 285 017   Basic Metabolic Panel: Recent Labs  Lab 09/16/17 0348 09/17/17 0331 09/18/17 0221 09/19/17 0229 09/20/17 0231  NA 135 136 135 134* 133*  K 5.2* 5.6* 5.2* 6.2* 4.6  CL 105 104 104 102 100  CO2 20* 21* 23 21* 21*  GLUCOSE 120* 92 101* 126* 120*  BUN 81* 90* 95* 102* 111*  CREATININE 4.16* 4.13* 4.31* 4.35* 4.57*  CALCIUM 8.0* 8.0* 7.9* 8.1* 7.8*  MG  --   --   --   --  2.0   GFR: Estimated Creatinine Clearance: 13.6  mL/min (A) (by C-G formula based on SCr of 4.57 mg/dL (H)). Liver Function Tests: No results for input(s): AST, ALT, ALKPHOS, BILITOT, PROT, ALBUMIN in the last 168 hours. No results for input(s): LIPASE, AMYLASE in the last 168 hours. No results for input(s): AMMONIA in the last 168 hours. Coagulation Profile: No results for input(s): INR, PROTIME in the last 168 hours. Cardiac Enzymes: No results for input(s): CKTOTAL, CKMB, CKMBINDEX, TROPONINI in the last 168 hours. BNP (last 3 results) No results for input(s): PROBNP in the last 8760 hours. HbA1C: No results for input(s): HGBA1C in the last 72 hours. CBG: Recent Labs  Lab 09/19/17 1226 09/19/17 1722 09/19/17 2117 09/20/17 0755 09/20/17 1208  GLUCAP 126* 101* 184* 94 167*   Lipid Profile: No results for input(s): CHOL, HDL, LDLCALC, TRIG, CHOLHDL, LDLDIRECT in the last 72 hours. Thyroid Function Tests: No results for input(s): TSH, T4TOTAL, FREET4, T3FREE, THYROIDAB in the last 72 hours. Anemia Panel: No results for input(s): VITAMINB12, FOLATE, FERRITIN, TIBC, IRON, RETICCTPCT in the last 72 hours.    Radiology Studies: I have reviewed all of the imaging during this hospital visit personally     Scheduled Meds: . amLODipine  10 mg Oral Daily  . atorvastatin  40  mg Oral q1800  . docusate sodium  100 mg Oral Daily  . famotidine  20 mg Oral Daily  . furosemide  160 mg Oral BID  . heparin  5,000 Units Subcutaneous Q8H  . hydrALAZINE  50 mg Oral BID  . insulin aspart  0-9 Units Subcutaneous TID WC  . metoprolol tartrate  12.5 mg Oral Daily  . patiromer  8.4 g Oral Daily  . polyethylene glycol  17 g Oral BID  . sodium chloride flush  10-40 mL Intracatheter Q12H   Continuous Infusions: . sodium chloride       LOS: 12 days        Mauricio Gerome Apley, MD [facility] Pager [phone]

## 2017-09-20 NOTE — Progress Notes (Signed)
   Following peripherally.  From a thoracic aneurysm standpoint she is okay for discharge.  If she needs access for dialysis prior to discharge we will place a tunneled catheter and right upper arm AV fistula versus graft.  We will plan to follow-up in the office with noncontrasted CT scan unless she progresses to end-stage renal at which time I would rather follow her with contrasted study.  I have discussed all of this with her.  Lorma Heater C. Donzetta Matters, MD [facility] Office: [phone] Pager: [phone]

## 2017-09-20 NOTE — Progress Notes (Signed)
OT Cancellation Note  Patient Details Name: Diana Walters MRN: [id number] DOB: Jun 15, 1955   Cancelled Treatment:    Reason Eval/Treat Not Completed: Other (comment)(Attempted visit twice. Pt first stating she just couldn't emotional participate in therapy and was tearful. Pt agreed to participate in thirty minutes. Attempt second time and pt OOB with PT. Will continue to follow acutely and return as schedule allows.)  [place], OTR/L Acute Rehab Pager: [phone] Office: [phone] 09/20/2017, 2:25 PM

## 2017-09-20 NOTE — Care Management Note (Addendum)
Case Management Note  Patient Details  Name: Diana Walters MRN: [id number] Date of Birth: 1955-12-23  Subjective/Objective:   Pt admitted with CP - s/p AAA repair, now in renal failure  - not yet determined to need HD                 Action/Plan:  PTA Independent from home.  Pts family will provide 24 hour supervision as recommended.  CM provided hardcopy choice list for HH/DME as recommended.  CM will follow back up with pt.   Expected Discharge Date:                  Expected Discharge Plan:  Diana Walters  In-House Referral:     Discharge planning Services  CM Consult  Post Acute Care Choice:    Choice offered to:     DME Arranged:    DME Agency:     HH Arranged:    HH Agency:     Status of Service:     If discussed at [employer], dates discussed:    Additional Comments: 09/20/2017  CM confirmed with Renal Doctor Joelyn Oms at this time pt will not go home on Veltassa. Dr Joelyn Oms also informed CM that if needed post discharge pt will be set up with drug by his office.   CM will continue to follow.   Pt chose [facility] for HHPT - CM requested orders so arrangements can be made  09/16/17 Unfortunately pt continues to delay picking a [place] agency.  CM offered to call daughter however pt refused.  CM again stressed the importance of getting [place] arranged prior to discharge - pt acknowledged the request.  Weekend CM to follow up with pt CM also requested palliative consult with attending consideration based on pts renal function and current refusal of dialysis.   09/15/2017 CM checked back with pt to get choice for [facility] agency - unfortunately pt has still not decided on [facility] agency.  CM will check back with pt  Maryclare Labrador, RN 09/20/2017, 2:30 PM

## 2017-09-20 NOTE — Progress Notes (Signed)
Physical Therapy Treatment Patient Details Name: Diana Walters MRN: [id number] DOB: [birth year] Today's Date: 09/20/2017    History of Present Illness 62 y.o. female s/p TEVAR 7/25. Pt also presenting with severe L knee pain (per pt report due to RA and lack of being able to take her RA meds). PMH includes: CVA, HTN, Seizure, AKI thoracic aortic aneurysm, RA    PT Comments    Pt very upset regarding both the constant pain in L knee and L flank and the idea of DIAlysis. Pt emotional and very tearful. Had extensive discussion with patient who reports she does want to live and go home. Pt did get up and ambulate with PT however has regressed since initial eval. Pt instructed to sit up on the EOB for every meal and to transfer to/from [facility] with RN assist when needing to void or have BM instead of purwick. Pt agreeable and states "I want to live and go home. I"m going to fight". Pt con't to have excessive edema in L UE and L knee greatly limiting OOB mobility tolerance. At this time pt would need 24/7 assist for safe discharge home. Acute PT to cont to follow.    Follow Up Recommendations  Home health PT;Supervision/Assistance - 24 hour(will need SNF if pt can't achieve supervision level )     Equipment Recommendations  Rolling walker with 5" wheels;3in1 (PT)    Recommendations for Other Services       Precautions / Restrictions Precautions Precautions: Fall Restrictions Weight Bearing Restrictions: No    Mobility  Bed Mobility Overal bed mobility: Needs Assistance Bed Mobility: Supine to Sit     Supine to sit: Supervision;HOB elevated Sit to supine: Min assist   General bed mobility comments: with increased time pt able to bring self to EOB, minA for LE management back into bed  Transfers Overall transfer level: Needs assistance Equipment used: Rolling walker (2 wheeled) Transfers: Sit to/from Stand Sit to Stand: Min guard         General transfer comment: pt required 3  attempts, labored effort and increased pain however pt completed without physical assist  Ambulation/Gait Ambulation/Gait assistance: Min guard Gait Distance (Feet): 75 Feet Assistive device: Rolling walker (2 wheeled) Gait Pattern/deviations: Step-through pattern;Decreased stride length;Antalgic Gait velocity: slow Gait velocity interpretation: <1.8 ft/sec, indicate of risk for recurrent falls General Gait Details: pt with onset of 8/10 L knee and flank pain. pt in tears with c/o "im getting so hot" from pain.   Stairs             Wheelchair Mobility    Modified Rankin (Stroke Patients Only)       Balance Overall balance assessment: Needs assistance Sitting-balance support: Feet supported;No upper extremity supported Sitting balance-Leahy Scale: Good     Standing balance support: Bilateral upper extremity supported Standing balance-Leahy Scale: Poor Standing balance comment: pt able to stand and blow nose but had to frequent touch the walker for balance                            Cognition Arousal/Alertness: Awake/alert Behavior During Therapy: (upset over thought of dialysis) Overall Cognitive Status: Impaired/Different from baseline                                 General Comments: pt with depressed spirits and is emotional/upset over idea of Diaylsis. Pt also upset  about L knee pain and how its limiting her mobility      Exercises      General Comments General comments (skin integrity, edema, etc.): VSS however pt with severe swelling L UE and L knee,       Pertinent Vitals/Pain Pain Assessment: 0-10 Pain Score: 8  Pain Location: L knee and L flankk pain Pain Descriptors / Indicators: Crying;Grimacing;Pressure;Shooting Pain Intervention(s): Limited activity within patient's tolerance    Home Living                      Prior Function            PT Goals (current goals can now be found in the care plan section)  Acute Rehab PT Goals Patient Stated Goal: go home Progress towards PT goals: Not progressing toward goals - comment(due to pain)    Frequency    Min 3X/week      PT Plan Current plan remains appropriate    Co-evaluation              AM-PAC PT "6 Clicks" Daily Activity  Outcome Measure  Difficulty turning over in bed (including adjusting bedclothes, sheets and blankets)?: Unable Difficulty moving from lying on back to sitting on the side of the bed? : Unable Difficulty sitting down on and standing up from a chair with arms (e.g., wheelchair, bedside commode, etc,.)?: Unable Help needed moving to and from a bed to chair (including a wheelchair)?: A Little Help needed walking in hospital room?: A Little Help needed climbing 3-5 steps with a railing? : A Lot 6 Click Score: 11    End of Session Equipment Utilized During Treatment: Gait belt Activity Tolerance: Patient limited by pain Patient left: in bed;with call bell/phone within reach;with bed alarm set Nurse Communication: Mobility status PT Visit Diagnosis: Unsteadiness on feet (R26.81);Pain;Difficulty in walking, not elsewhere classified (R26.2) Pain - Right/Left: Left Pain - part of body: Knee     Time: 1400-1449 PT Time Calculation (min) (ACUTE ONLY): 49 min  Charges:  $Gait Training: 8-22 mins $Therapeutic Activity: 23-37 mins                     Kittie Plater, PT, DPT Pager #: [phone] Office #: [phone]    [place] 09/20/2017, 3:30 PM

## 2017-09-20 NOTE — Progress Notes (Signed)
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    Subjective:  . Serum creatinine up slightly from yesterday after receiving furosemide and metolazone . Potassium stable at 4.6 . More constructive conversation today regarding why surgery for fistula or graft would be reasonable at this time; she is not ready to say yes but has been more attentive to my concerns . Eating okay, no dysgeusia, no asterixis  08/05 0701 - 08/06 0700 In: 1210 [P.O.:1200; I.V.:10] Out: 900 [Urine:900]  Filed Weights   09/18/17 0600 09/19/17 0600 09/20/17 0300  Weight: 67.6 kg (149 lb 0.5 oz) 66.7 kg (147 lb 0.8 oz) 69.1 kg (152 lb 5.4 oz)    Scheduled Meds: . amLODipine  10 mg Oral Daily  . atorvastatin  40 mg Oral q1800  . docusate sodium  100 mg Oral Daily  . famotidine  20 mg Oral Daily  . furosemide  160 mg Oral BID  . heparin  5,000 Units Subcutaneous Q8H  . hydrALAZINE  50 mg Oral BID  . insulin aspart  0-9 Units Subcutaneous TID WC  . metoprolol tartrate  12.5 mg Oral Daily  . pantoprazole  40 mg Oral Daily  . patiromer  8.4 g Oral Daily  . polyethylene glycol  17 g Oral BID  . sodium chloride flush  10-40 mL Intracatheter Q12H   Continuous Infusions: . sodium chloride     PRN Meds:.sodium chloride, ondansetron (ZOFRAN) IV, oxyCODONE-acetaminophen, sodium chloride flush  Current Labs: reviewed    Physical Exam:  Blood pressure (!) 144/55, pulse 69, temperature 98 F (36.7 C), temperature source Oral, resp. rate 12, height 5\' 10"  (1.778 m), weight 69.1 kg (152 lb 5.4 oz), SpO2 100 %.  NAD 15deg in bed breathing normally 3/6 MSM best at RUSB, normal rate and rhythm CTAB NO LEE  A 1. AoCKD now stage 5; stable SCr/BUN 2. Hyperkalemia, Mild; ? If from resolution of clot 3. 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 HTN, not controlled 5. Hx/o CVA 6. DM2  P . Change ot [place] 8.4gm daily for K control; cont if K >  4.5 . Cont lasix, hold metolazone today . Cont to dialogue about permanent access; if patient is willing would suggest placement prior to discharge or soon thereafter . will begin setting up outpatient follow-up . Daily weights, Daily Renal Panel, Strict I/Os, Avoid nephrotoxins (NSAIDs, judicious IV Contrast)    Pearson Grippe MD 09/20/2017, 11:32 AM  Recent Labs  Lab 09/18/17 0221 09/19/17 0229 09/20/17 0231  NA 135 134* 133*  K 5.2* 6.2* 4.6  CL 104 102 100  CO2 23 21* 21*  GLUCOSE 101* 126* 120*  BUN 95* 102* 111*  CREATININE 4.31* 4.35* 4.57*  CALCIUM 7.9* 8.1* 7.8*   Recent Labs  Lab 09/14/17 0315 09/15/17 0304  WBC 8.9 10.7*  NEUTROABS  --  8.6*  HGB 9.8* 10.1*  HCT 31.0* 32.0*  MCV 86.4 86.5  PLT 285 291

## 2017-09-20 NOTE — Care Management Important Message (Signed)
Important Message  Patient Details  Name: Diana Walters MRN: [id number] Date of Birth: 09-11-1955   Medicare Important Message Given:  Yes    [name] 09/20/2017, 3:21 PM

## 2017-09-21 LAB — GLUCOSE, CAPILLARY
GLUCOSE-CAPILLARY: 77 mg/dL (ref 70–99)
GLUCOSE-CAPILLARY: 94 mg/dL (ref 70–99)
Glucose-Capillary: 111 mg/dL — ABNORMAL HIGH (ref 70–99)
Glucose-Capillary: 166 mg/dL — ABNORMAL HIGH (ref 70–99)

## 2017-09-21 LAB — BASIC METABOLIC PANEL
Anion gap: 14 (ref 5–15)
BUN: 119 mg/dL — AB (ref 8–23)
CO2: 20 mmol/L — ABNORMAL LOW (ref 22–32)
CREATININE: 4.61 mg/dL — AB (ref 0.44–1.00)
Calcium: 7.9 mg/dL — ABNORMAL LOW (ref 8.9–10.3)
Chloride: 101 mmol/L (ref 98–111)
GFR, EST AFRICAN AMERICAN: 11 mL/min — AB (ref >60)
GFR, EST NON AFRICAN AMERICAN: 9 mL/min — AB (ref >60)
Glucose, Bld: 94 mg/dL (ref 70–99)
POTASSIUM: 5.1 mmol/L (ref 3.5–5.1)
SODIUM: 135 mmol/L (ref 135–145)

## 2017-09-21 LAB — CBC WITH DIFFERENTIAL/PLATELET
BASOS ABS: 0 10*3/uL (ref 0.0–0.1)
Basophils Relative: 0 %
EOS ABS: 0.3 10*3/uL (ref 0.0–0.7)
Eosinophils Relative: 2 %
HCT: 36.4 % (ref 36.0–46.0)
HEMOGLOBIN: 11.3 g/dL — AB (ref 12.0–15.0)
LYMPHS PCT: 11 %
Lymphs Abs: 1.6 10*3/uL (ref 0.7–4.0)
MCH: 28 pg (ref 26.0–34.0)
MCHC: 31 g/dL (ref 30.0–36.0)
MCV: 90.3 fL (ref 78.0–100.0)
MONO ABS: 1.6 10*3/uL — AB (ref 0.1–1.0)
Monocytes Relative: 11 %
NEUTROS PCT: 76 %
Neutro Abs: 10.7 10*3/uL — ABNORMAL HIGH (ref 1.7–7.7)
PLATELETS: 198 10*3/uL (ref 150–400)
RBC: 4.03 MIL/uL (ref 3.87–5.11)
RDW: 23.9 % — ABNORMAL HIGH (ref 11.5–15.5)
WBC: 14.2 10*3/uL — AB (ref 4.0–10.5)

## 2017-09-21 MED ORDER — HYDRALAZINE HCL 50 MG PO TABS
75.0000 mg | ORAL_TABLET | Freq: Two times a day (BID) | ORAL | Status: DC
  Administered 2017-09-21 – 2017-09-22 (×3): 75 mg via ORAL
  Filled 2017-09-21 (×3): qty 2

## 2017-09-21 MED ORDER — NEPRO/CARBSTEADY PO LIQD
237.0000 mL | Freq: Every day | ORAL | Status: DC
  Administered 2017-09-21 – 2017-09-22 (×2): 237 mL via ORAL
  Filled 2017-09-21 (×2): qty 237

## 2017-09-21 MED ORDER — CEFAZOLIN SODIUM-DEXTROSE 1-4 GM/50ML-% IV SOLN
1.0000 g | INTRAVENOUS | Status: AC
  Administered 2017-09-22: 1 g via INTRAVENOUS
  Filled 2017-09-21 (×2): qty 50

## 2017-09-21 MED ORDER — FUROSEMIDE 80 MG PO TABS
160.0000 mg | ORAL_TABLET | Freq: Every day | ORAL | Status: DC
  Administered 2017-09-22: 160 mg via ORAL
  Filled 2017-09-21: qty 2

## 2017-09-21 NOTE — Progress Notes (Signed)
Physical Therapy Treatment Patient Details Name: Diana Walters MRN: [id number] DOB: [birth year] Today's Date: 09/21/2017    History of Present Illness 62 y.o. female s/p TEVAR 7/25. Pt also presenting with severe L knee pain (per pt report due to RA and lack of being able to take her RA meds). PMH includes: CVA, HTN, Seizure, AKI thoracic aortic aneurysm, RA    PT Comments    Pt with progression today. Still very limited by knee pain, pt struggles while standing becoming tearful but ultimately able to do at supervision/min guard at most level. Pt adamant to attempt stairs today, ascending/descending 3 stairs with rails and min guard level for safety. If patient has 24/7 family support, believe HHPT will be acceptable as she is min guard level for all OOB mobility at this time. No family present to confirm/discuss. Discussed with RN.    Follow Up Recommendations  Home health PT;Supervision/Assistance - 24 hour     Equipment Recommendations  Rolling walker with 5" wheels;3in1 (PT)    Recommendations for Other Services       Precautions / Restrictions Precautions Precautions: Fall Restrictions Weight Bearing Restrictions: No    Mobility  Bed Mobility Overal bed mobility: Needs Assistance Bed Mobility: Supine to Sit     Supine to sit: Supervision;HOB elevated     General bed mobility comments: with increased time pt able to bring self to EOB, minA for LE management back into bed  Transfers Overall transfer level: Needs assistance Equipment used: Rolling walker (2 wheeled) Transfers: Sit to/from Stand Sit to Stand: Min guard         General transfer comment: pt with increased time and effort, tearful, able to do without hands on assistance. moaning and heavy breathing at times  Ambulation/Gait                 Stairs Stairs: Yes Stairs assistance: Min guard Stair Management: Two rails;Step to pattern Number of Stairs: 3 General stair comments: cued patient  for proper sequencing, min guard for safety able to climb 3 stairs and back down without LOB.    Wheelchair Mobility    Modified Rankin (Stroke Patients Only)       Balance Overall balance assessment: Needs assistance Sitting-balance support: Feet supported;No upper extremity supported Sitting balance-Leahy Scale: Good     Standing balance support: Bilateral upper extremity supported Standing balance-Leahy Scale: Poor                              Cognition Arousal/Alertness: Awake/alert Behavior During Therapy: WFL for tasks assessed/performed Overall Cognitive Status: Impaired/Different from baseline                                 General Comments: pt with depressed spirits and is emotional/upset over idea of Diaylsis. Pt also upset about L knee pain and how its limiting her mobility      Exercises      General Comments        Pertinent Vitals/Pain Pain Assessment: Faces Faces Pain Scale: Hurts whole lot Pain Location: L knee and L flankk pain Pain Descriptors / Indicators: Crying;Grimacing;Pressure;Shooting Pain Intervention(s): Limited activity within patient's tolerance;Monitored during session    Home Living                      Prior Function  PT Goals (current goals can now be found in the care plan section) Acute Rehab PT Goals Patient Stated Goal: go home PT Goal Formulation: With patient Time For Goal Achievement: 09/27/17 Potential to Achieve Goals: Good Progress towards PT goals: Progressing toward goals    Frequency    Min 3X/week      PT Plan Current plan remains appropriate    Co-evaluation              AM-PAC PT "6 Clicks" Daily Activity  Outcome Measure  Difficulty turning over in bed (including adjusting bedclothes, sheets and blankets)?: A Lot Difficulty moving from lying on back to sitting on the side of the bed? : A Lot Difficulty sitting down on and standing up from a  chair with arms (e.g., wheelchair, bedside commode, etc,.)?: A Lot Help needed moving to and from a bed to chair (including a wheelchair)?: A Little Help needed walking in hospital room?: A Little Help needed climbing 3-5 steps with a railing? : A Little 6 Click Score: 15    End of Session Equipment Utilized During Treatment: Gait belt Activity Tolerance: Patient limited by pain     PT Visit Diagnosis: Unsteadiness on feet (R26.81);Pain;Difficulty in walking, not elsewhere classified (R26.2) Pain - Right/Left: Left Pain - part of body: Knee     Time: 1200-1240(increasd time due to patient slow moving/talking distracted) PT Time Calculation (min) (ACUTE ONLY): 40 min  Charges:  $Gait Training: 8-22 mins $Therapeutic Activity: 8-22 mins                     Reinaldo Berber, PT, DPT Acute Rehab Services Pager: [phone]     Reinaldo Berber 09/21/2017, 12:47 PM

## 2017-09-21 NOTE — Progress Notes (Signed)
Occupational Therapy Treatment Patient Details Name: Diana Walters MRN: [id number] DOB: Aug 07, 1955 Today's Date: 09/21/2017    History of present illness 62 y.o. female s/p TEVAR 7/25. Pt also presenting with severe L knee pain (per pt report due to RA and lack of being able to take her RA meds). PMH includes: CVA, HTN, Seizure, AKI thoracic aortic aneurysm, RA   OT comments  This 62 yo female admitted with above and pt have permanent dialysis placed tomorrow presents to acute OT mostly limited by left knee pain due to RA (with pt reporting the reason it is hurting so badly is that she can't take her RA meds due to her kidney issues). She will continue to benefit from acute OT with follow up Ruch (feel SNF would be better option, but not open to this).   Follow Up Recommendations  Home health OT;Supervision/Assistance - 24 hour;Other (comment)(pt not open to SNF)    Equipment Recommendations  Tub/shower seat       Precautions / Restrictions Precautions Precautions: Fall Restrictions Weight Bearing Restrictions: No       Mobility Bed Mobility Overal bed mobility: Needs Assistance Bed Mobility: Sit to Supine     Supine to sit: Supervision;HOB elevated Sit to supine: Mod assist   General bed mobility comments: for LEs  Transfers Overall transfer level: Needs assistance Equipment used: Rolling walker (2 wheeled) Transfers: Sit to/from Stand Sit to Stand: Mod assist Stand pivot transfers: Min guard       General transfer comment: pt with increased time and effort, tearful, able to do without hands on assistance. moaning and heavy breathing at times    Balance Overall balance assessment: Needs assistance Sitting-balance support: Feet supported;No upper extremity supported Sitting balance-Leahy Scale: Good     Standing balance support: Bilateral upper extremity supported Standing balance-Leahy Scale: Poor                             ADL either performed or  assessed with clinical judgement   ADL Overall ADL's : Needs assistance/impaired                         Toilet Transfer: Moderate assistance;+2 for physical assistance;Stand-pivot   Toileting- Clothing Manipulation and Hygiene: Min guard Toileting - Clothing Manipulation Details (indicate cue type and reason): Mod A sit<>stand   Tub/Shower Transfer Details (indicate cue type and reason): Discussed and demonstrated to pt how she can use 3n1 as a shower seat v. buying a tub seat---post demonstration pt says she will get a tub seat.         Vision Patient Visual Report: No change from baseline            Cognition Arousal/Alertness: Awake/alert Behavior During Therapy: WFL for tasks assessed/performed Overall Cognitive Status: Impaired/Different from baseline                                 General Comments: pt with depressed spirits and is emotional/upset over idea of Diaylsis. Pt also upset about L knee pain and how its limiting her mobility                   Pertinent Vitals/ Pain       Pain Assessment: Faces Faces Pain Scale: Hurts whole lot Pain Location: L knee Pain Descriptors / Indicators: Crying;Grimacing;Pressure Pain Intervention(s): Limited  activity within patient's tolerance;Monitored during session;Repositioned         Frequency  Min 2X/week        Progress Toward Goals  OT Goals(current goals can now be found in the care plan section)  Progress towards OT goals: Not progressing toward goals - comment(due to left knee pain)  Acute Rehab OT Goals Patient Stated Goal: go home  Plan Discharge plan remains appropriate       AM-PAC PT "6 Clicks" Daily Activity     Outcome Measure   Help from another person eating meals?: None Help from another person taking care of personal grooming?: A Little Help from another person toileting, which includes using toliet, bedpan, or urinal?: A Lot Help from another person bathing  (including washing, rinsing, drying)?: A Lot Help from another person to put on and taking off regular upper body clothing?: A Little Help from another person to put on and taking off regular lower body clothing?: A Lot 6 Click Score: 16    End of Session Equipment Utilized During Treatment: Rolling walker  OT Visit Diagnosis: Unsteadiness on feet (R26.81);Other abnormalities of gait and mobility (R26.89);Muscle weakness (generalized) (M62.81);Pain Pain - Right/Left: Left Pain - part of body: Knee   Activity Tolerance Patient limited by pain   Patient Left with call bell/phone within reach;in bed;with bed alarm set   Nurse Communication          Time: 1320-1407 OT Time Calculation (min): 47 min  Charges: OT General Charges $OT Visit: 1 Visit OT Evaluation $OT Eval Low Complexity: 1 Low OT Treatments $Self Care/Home Management : 38-52 mins  [address], OTR/L [phone] 09/21/2017

## 2017-09-21 NOTE — Progress Notes (Signed)
Nutrition Follow-up  DOCUMENTATION CODES:   Not applicable  INTERVENTION:   -Nepro Shake po daily, each supplement provides 425 kcal and 19 grams protein  NUTRITION DIAGNOSIS:   Increased nutrient needs related to (AKI) as evidenced by estimated needs.  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 Pt shares she "feels wiped out" after doing stair training with physical therapy, but is glad she completed this task.   Pt reports her appetite is improving. Noted meal completion 50-100%. She endorses that she is no longer receiving pork products per her preferences and expressed gratitude about this issue being resolved. She reports she tries to eat all of her meals "but that's really asking a lot". She reports she has not received any supplements and is requesting them, as she used them at home due to poor oral intake.   Per [facility] notes, pt has consented for HD catheter placement tomorrow (09/22/17).   Discussed with pt importance of good meal and supplement intake to promote healing.   Labs reviewed: CBGS: 77-135 (inpatient orders for glycemic control are 0-9 units insulin aspart TID with meals).   Diet Order:   Diet Order           Diet NPO time specified  Diet effective midnight        Diet renal with fluid restriction Fluid restriction: 1200 mL Fluid; Room service appropriate? Yes; Fluid consistency: Thin  Diet effective now          EDUCATION NEEDS:   Education needs have been addressed  Skin:  Skin  Assessment: Skin Integrity Issues: Skin Integrity Issues:: Incisions Incisions: rt groin  Last BM:  09/19/17  Height:   Ht Readings from Last 1 Encounters:  09/08/17 5\' 10"  (1.778 m)    Weight:   Wt Readings from Last 1 Encounters:  09/21/17 152 lb 12.5 oz (69.3 kg)    Ideal Body Weight:  68.18 kg  BMI:  Body mass index is 21.92 kg/m.  Estimated Nutritional Needs:   Kcal:  1700-1900 calories  Protein:  95-105 grams   Fluid:  >1.7L    Louna Rothgeb A. Jimmye Norman, RD, LDN, CDE Pager: [phone] After hours Pager: [phone]

## 2017-09-21 NOTE — Progress Notes (Signed)
PROGRESS NOTE    Diana Walters  VEL:[id number] DOB: 02-Mar-1955 DOA: 09/07/2017 PCP: Renato Shin, MD    Brief Narrative:  62 year old female who presented with weakness and back pain. She does have significant past medical history for high-grade thoracic aneurysm repair 2014 ([facility]),anemia, asthma, osteoarthritis, rheumatoid arthritis, tobacco abuse, hypertension, dyslipidemia, seizures, CVA, CKDand depression. She was diagnosed with severe anemia, hemoglobin 4.6 and advised to come to the emergency room department.On further questioning reported intermittent pain between her shoulder blades for the last 2 weeks.Blood pressure 145/79, heart rate 74, temperature 98.6, respiratory rate28, oxygen saturation 96%. Awake and alert, lungs clear to auscultation bilaterally, heart S1-S2 present and rhythmic, abdomen soft and nontender, 1+lower extremity edema. Sodium 140, potassium 4.5, chloride 1 8, bicarb 20, glucose 87, BUN 54, creatinine 3.3,troponin less than 0.03,white count 10.1, hemoglobin 5.1, hematocrit 17.5, platelets 539.Urinalysis with 100 protein, specific gravity 1.014, 0-5 RBCs, 0-5 white cells. Chest x-ray with hyperinflation, aortic stent graft in place with fullness around the aortic arch. EKG normal sinus rhythm, normal axis, normal intervals, positive PACs.  CT angiography of her chest showed aortic endograft extending from the ascending to the lower descending thoracic aorta. Evidence of acute endoleak at the level of the thoracic arch with active extravasation of contrast material into the native aneurysm sac causing enlargement of the native sac. Extensive hematoma in the mediastinum around the arch and descending aorta. Left pleural effusion. No evidence of aneurysm or dissection in the abdominal aorta.  Patient was admitted to the intensive care unit with a working diagnosis of acute blood loss anemia due to extensive mediastinal hematoma related to aortic  aneurysmendoleak.   07/25.Vascular repair of aortic arch and descending thoracic aorta with proximal 34 x 200 mm extended into the descending aorta with 34 x 100 mm extension.  Postoperatively he has developed acute kidney injury. Nephrology consulted, renal function worsening   Assessment & Plan:   Active Problems:   Symptomatic anemia  1.Thoracic aortic endovascular leak with acute blood loss anemia due to mediastinal hematoma.Patient has remained stable,hemoglobin stable, chest pain free, will need outpatient follow upin 4 to 6 weeks with a non contrast CT to eval thoracic aneurysm/ if patient on HD then may be able to perform contrast study. Continue statin and blood pressure control.  2. 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 K 5.1 on PATIROMER by nephrology   and serum bicarbonate at 20, continue to hypervolemia with dependent edema. May need HD access on this admission before discharge. After discussion with nephrology patient has decided to proceed with permanent access    3. Anemia of chronic renal disease.hemoglobin has been stable, 11.3 today.Continue darbapoetin.    3.UncontrolledHTN.Improved blood pressure 140 to 258 mmHg systolic, will continue blood pressure control with metoprolol, hydralazine and amlodipine. Patient on high dose of furosemide. Hold on ace inh due to risk of worsening renal function.   4.Hx ofCVA.Plan for home health with home pt at discharge.   5. RA/OA. Pain seems to be well controlled.   6. T2DM.Capillary glucose 94 this morning, borderline low. continue with insulin sliding scale for glucose cover and monitoring.Patient tolerating po well.    DVT prophylaxis: scd  Code Status:  full Family Communication: no family at the bedside  Disposition Plan/ discharge barriers: pending improvement of renal function, may need renal replacement  therapy on this admission.   Consultants:   Nephrology   Procedures:  Antimicrobials:      Subjective: Patient deconditioned and weak, positive edema on the left arm and left leg, persistent, no nausea or vomiting, no chest pain or dyspnea.   Objective: Vitals:   09/20/17 2037 09/20/17 2300 09/21/17 0300 09/21/17 0500  BP: (!) 163/49 (!) 155/57 (!) 145/57 (!) 167/57  Pulse: 60 62 60 61  Resp: 11 13 11 12   Temp: (!) 97.5 F (36.4 C) 97.8 F (36.6 C) 97.8 F (36.6 C) (!) 97.3 F (36.3 C)  TempSrc: Oral Oral Oral Oral  SpO2: 95% 98% 100% 99%  Weight:   69.3 kg (152 lb 12.5 oz)   Height:        Intake/Output Summary (Last 24 hours) at 09/21/2017 0908 Last data filed at 09/21/2017 0600 Gross per 24 hour  Intake 700 ml  Output 2000 ml  Net -1300 ml   Filed Weights   09/19/17 0600 09/20/17 0300 09/21/17 0300  Weight: 66.7 kg (147 lb 0.8 oz) 69.1 kg (152 lb 5.4 oz) 69.3 kg (152 lb 12.5 oz)    Examination:   General: Not in pain or dyspnea, deconditioned and ill looking appearing  Neurology: Awake and alert, non focal  E ENT: mild pallor, no icterus, oral mucosa moist Cardiovascular: No JVD. S1-S2 present, rhythmic, no gallops, rubs, or murmurs. ++/+++ lower extremity edema, predominantly dependant zones. Pulmonary: decreased breath sounds bilaterally at bases, no wheezing, rhonchi or rales. Gastrointestinal. Abdomen with, no organomegaly, non tender, no rebound or guarding Skin. No rashes Musculoskeletal: no joint deformities     Data Reviewed: I have personally reviewed following labs and imaging studies  CBC: Recent Labs  Lab 09/15/17 0304 09/21/17 0229  WBC 10.7* 14.2*  NEUTROABS 8.6* 10.7*  HGB 10.1* 11.3*  HCT 32.0* 36.4  MCV 86.5 90.3  PLT 291 240   Basic Metabolic Panel: Recent Labs  Lab 09/17/17 0331 09/18/17 0221 09/19/17 0229 09/20/17 0231 09/21/17 0229  NA 136 135 134* 133* 135  K 5.6* 5.2* 6.2* 4.6 5.1  CL 104 104 102 100 101   CO2 21* 23 21* 21* 20*  GLUCOSE 92 101* 126* 120* 94  BUN 90* 95* 102* 111* 119*  CREATININE 4.13* 4.31* 4.35* 4.57* 4.61*  CALCIUM 8.0* 7.9* 8.1* 7.8* 7.9*  MG  --   --   --  2.0  --    GFR: Estimated Creatinine Clearance: 13.5 mL/min (A) (by C-G formula based on SCr of 4.61 mg/dL (H)). Liver Function Tests: No results for input(s): AST, ALT, ALKPHOS, BILITOT, PROT, ALBUMIN in the last 168 hours. No results for input(s): LIPASE, AMYLASE in the last 168 hours. No results for input(s): AMMONIA in the last 168 hours. Coagulation Profile: No results for input(s): INR, PROTIME in the last 168 hours. Cardiac Enzymes: No results for input(s): CKTOTAL, CKMB, CKMBINDEX, TROPONINI in the last 168 hours. BNP (last 3 results) No results for input(s): PROBNP in the last 8760 hours. HbA1C: No results for input(s): HGBA1C in the last 72 hours. CBG: Recent Labs  Lab 09/20/17 0755 09/20/17 1208 09/20/17 1712 09/20/17 2145 09/21/17 0831  GLUCAP 94 167* 98 135* 77   Lipid Profile: No results for input(s): CHOL, HDL, LDLCALC, TRIG, CHOLHDL, LDLDIRECT in the last 72 hours. Thyroid Function Tests: No results for input(s): TSH, T4TOTAL, FREET4, T3FREE, THYROIDAB in the last 72 hours. Anemia Panel: No results for input(s): VITAMINB12, FOLATE, FERRITIN, TIBC, IRON, RETICCTPCT in the last 72 hours.    Radiology Studies: I have reviewed all of the imaging  during this hospital visit personally     Scheduled Meds: . amLODipine  10 mg Oral Daily  . atorvastatin  40 mg Oral q1800  . docusate sodium  100 mg Oral Daily  . famotidine  20 mg Oral Daily  . furosemide  160 mg Oral BID  . heparin  5,000 Units Subcutaneous Q8H  . hydrALAZINE  50 mg Oral BID  . insulin aspart  0-9 Units Subcutaneous TID WC  . metoprolol tartrate  12.5 mg Oral Daily  . patiromer  8.4 g Oral Daily  . polyethylene glycol  17 g Oral BID  . sodium chloride flush  10-40 mL Intracatheter Q12H   Continuous  Infusions: . sodium chloride       LOS: 13 days        Reyne Dumas, MD [facility] Pager [phone]

## 2017-09-21 NOTE — Progress Notes (Signed)
  Progress Note    09/21/2017 12:00 PM 13 Days Post-Op  Subjective:  No new issues  Vitals:   09/21/17 0937 09/21/17 1155  BP: (!) 167/57 (!) 174/61  Pulse: 65 66  Resp:  15  Temp:  97.8 F (36.6 C)  SpO2:  100%    Physical Exam: aaox3 Non labored respirations Palpable right radial pulse  CBC    Component Value Date/Time   WBC 14.2 (H) 09/21/2017 0229   RBC 4.03 09/21/2017 0229   HGB 11.3 (L) 09/21/2017 0229   HGB 10.3 (L) 06/22/2017 1106   HCT 36.4 09/21/2017 0229   HCT 31.8 (L) 06/22/2017 1106   PLT 198 09/21/2017 0229   PLT 304 06/22/2017 1106   MCV 90.3 09/21/2017 0229   MCV 77 (L) 06/22/2017 1106   MCH 28.0 09/21/2017 0229   MCHC 31.0 09/21/2017 0229   RDW 23.9 (H) 09/21/2017 0229   RDW 21.7 (H) 06/22/2017 1106   LYMPHSABS 1.6 09/21/2017 0229   LYMPHSABS 1.9 01/19/2017 1128   MONOABS 1.6 (H) 09/21/2017 0229   EOSABS 0.3 09/21/2017 0229   EOSABS 0.3 01/19/2017 1128   BASOSABS 0.0 09/21/2017 0229   BASOSABS 0.0 01/19/2017 1128    BMET    Component Value Date/Time   NA 135 09/21/2017 0229   NA 141 06/22/2017 1106   K 5.1 09/21/2017 0229   CL 101 09/21/2017 0229   CO2 20 (L) 09/21/2017 0229   GLUCOSE 94 09/21/2017 0229   BUN 119 (H) 09/21/2017 0229   BUN 31 (H) 06/22/2017 1106   CREATININE 4.61 (H) 09/21/2017 0229   CREATININE 1.10 06/10/2014 0813   CALCIUM 7.9 (L) 09/21/2017 0229   CALCIUM 7.6 (L) 06/09/2017 0230   GFRNONAA 9 (L) 09/21/2017 0229   GFRAA 11 (L) 09/21/2017 0229    INR    Component Value Date/Time   INR 1.09 09/07/2017 2009     Intake/Output Summary (Last 24 hours) at 09/21/2017 1200 Last data filed at 09/21/2017 0600 Gross per 24 hour  Intake 600 ml  Output 2000 ml  Net -1400 ml     Assessment:  62 y.o. female is TEVAR, now in need of hd access  Plan: Npo past midnight for hd access tomorrow. Right arm avg vs avf tomorrow in OR.    Brandon C. Donzetta Matters, MD [facility] Office:  [phone] Pager: [phone]  09/21/2017 12:00 PM

## 2017-09-21 NOTE — Progress Notes (Signed)
Admit: 09/07/2017 LOS: 19  36F CKD5 (sees [facility]) admitted with endoleak of TAA s/p repair 7/25.    Subjective:  . Patient states today that she is willing to proceed with permanent access . Creatinine stable, BUN 119, potassium 5.1 on furosemide 160 mg IV twice daily, daily patiromer. . No overt uremia at the current time . I wonder if resolution of the extensive hematoma related to the endoleak is contributing to her azotemia and hyperkalemia . Good urine output  08/06 0701 - 08/07 0700 In: 820 [P.O.:820] Out: 2000 [Urine:2000]  Filed Weights   09/19/17 0600 09/20/17 0300 09/21/17 0300  Weight: 66.7 kg (147 lb 0.8 oz) 69.1 kg (152 lb 5.4 oz) 69.3 kg (152 lb 12.5 oz)    Scheduled Meds: . amLODipine  10 mg Oral Daily  . atorvastatin  40 mg Oral q1800  . docusate sodium  100 mg Oral Daily  . famotidine  20 mg Oral Daily  . [START ON 09/22/2017] furosemide  160 mg Oral Daily  . heparin  5,000 Units Subcutaneous Q8H  . hydrALAZINE  50 mg Oral BID  . insulin aspart  0-9 Units Subcutaneous TID WC  . metoprolol tartrate  12.5 mg Oral Daily  . patiromer  8.4 g Oral Daily  . polyethylene glycol  17 g Oral BID  . sodium chloride flush  10-40 mL Intracatheter Q12H   Continuous Infusions: . sodium chloride     PRN Meds:.sodium chloride, ondansetron (ZOFRAN) IV, oxyCODONE-acetaminophen, sodium chloride flush  Current Labs: reviewed    Physical Exam:  Blood pressure (!) 167/57, pulse 65, temperature (!) 97.3 F (36.3 C), temperature source Oral, resp. rate 10, height 5\' 10"  (1.778 m), weight 69.3 kg (152 lb 12.5 oz), SpO2 99 %.  NAD 15deg in bed breathing normally 3/6 MSM best at RUSB, normal rate and rhythm CTAB NO LEE  A 1. AoCKD now stage 5; stable SCr but with significant azotemia 2. Hyperkalemia, Mild; ? If from resolution of clot; on patiromer 3. 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 HTN, not controlled 5. Hx/o CVA 6. DM2  P . Will notify vascular surgery  the patient is willing to proceed with permanent access; without current uremia I think we can pause on tunneled dialysis catheter and less it develops or her labs continue to worsen . Reduce furosemide to once daily . Increase hydralazine to 75 mg twice daily . Continue to hold metolazone . Cont Veltassa 8.4gm daily for K control; cont if K > 4.5 . Daily weights, Daily Renal Panel, Strict I/Os, Avoid nephrotoxins (NSAIDs, judicious IV Contrast)    Pearson Grippe MD 09/21/2017, 11:31 AM  Recent Labs  Lab 09/19/17 0229 09/20/17 0231 09/21/17 0229  NA 134* 133* 135  K 6.2* 4.6 5.1  CL 102 100 101  CO2 21* 21* 20*  GLUCOSE 126* 120* 94  BUN 102* 111* 119*  CREATININE 4.35* 4.57* 4.61*  CALCIUM 8.1* 7.8* 7.9*   Recent Labs  Lab 09/15/17 0304 09/21/17 0229  WBC 10.7* 14.2*  NEUTROABS 8.6* 10.7*  HGB 10.1* 11.3*  HCT 32.0* 36.4  MCV 86.5 90.3  PLT 291 198

## 2017-09-22 ENCOUNTER — Encounter (HOSPITAL_COMMUNITY)
Admission: EM | Disposition: A | Discharge status: Home Health | Payer: Self-pay | Source: Home / Self Care | Attending: Internal Medicine

## 2017-09-22 ENCOUNTER — Inpatient Hospital Stay (HOSPITAL_COMMUNITY): Payer: Medicare Other | Admitting: Certified Registered Nurse Anesthetist

## 2017-09-22 ENCOUNTER — Encounter (HOSPITAL_COMMUNITY): Payer: Self-pay | Admitting: *Deleted

## 2017-09-22 DIAGNOSIS — M069 Rheumatoid arthritis, unspecified: Secondary | ICD-10-CM

## 2017-09-22 DIAGNOSIS — Z992 Dependence on renal dialysis: Secondary | ICD-10-CM

## 2017-09-22 DIAGNOSIS — Z95828 Presence of other vascular implants and grafts: Secondary | ICD-10-CM

## 2017-09-22 DIAGNOSIS — I712 Thoracic aortic aneurysm, without rupture: Secondary | ICD-10-CM

## 2017-09-22 DIAGNOSIS — N185 Chronic kidney disease, stage 5: Secondary | ICD-10-CM

## 2017-09-22 DIAGNOSIS — N184 Chronic kidney disease, stage 4 (severe): Secondary | ICD-10-CM

## 2017-09-22 HISTORY — PX: AV FISTULA PLACEMENT: SHX1204

## 2017-09-22 LAB — CBC
HCT: 33.4 % — ABNORMAL LOW (ref 36.0–46.0)
Hemoglobin: 10.4 g/dL — ABNORMAL LOW (ref 12.0–15.0)
MCH: 27.6 pg (ref 26.0–34.0)
MCHC: 31.1 g/dL (ref 30.0–36.0)
MCV: 88.6 fL (ref 78.0–100.0)
PLATELETS: 171 10*3/uL (ref 150–400)
RBC: 3.77 MIL/uL — ABNORMAL LOW (ref 3.87–5.11)
RDW: 23.9 % — AB (ref 11.5–15.5)
WBC: 12.9 10*3/uL — AB (ref 4.0–10.5)

## 2017-09-22 LAB — COMPREHENSIVE METABOLIC PANEL
ALK PHOS: 68 U/L (ref 38–126)
ALT: 9 U/L (ref 0–44)
AST: 14 U/L — AB (ref 15–41)
Albumin: 2.3 g/dL — ABNORMAL LOW (ref 3.5–5.0)
Anion gap: 11 (ref 5–15)
BILIRUBIN TOTAL: 0.7 mg/dL (ref 0.3–1.2)
BUN: 118 mg/dL — AB (ref 8–23)
CALCIUM: 8 mg/dL — AB (ref 8.9–10.3)
CHLORIDE: 103 mmol/L (ref 98–111)
CO2: 23 mmol/L (ref 22–32)
CREATININE: 4.61 mg/dL — AB (ref 0.44–1.00)
GFR calc Af Amer: 11 mL/min — ABNORMAL LOW (ref >60)
GFR calc non Af Amer: 9 mL/min — ABNORMAL LOW (ref >60)
Glucose, Bld: 93 mg/dL (ref 70–99)
Potassium: 4.7 mmol/L (ref 3.5–5.1)
Sodium: 137 mmol/L (ref 135–145)
TOTAL PROTEIN: 5.8 g/dL — AB (ref 6.5–8.1)

## 2017-09-22 LAB — GLUCOSE, CAPILLARY
GLUCOSE-CAPILLARY: 87 mg/dL (ref 70–99)
GLUCOSE-CAPILLARY: 103 mg/dL — AB (ref 70–99)
Glucose-Capillary: 87 mg/dL (ref 70–99)

## 2017-09-22 LAB — PROTIME-INR
INR: 1.09
Prothrombin Time: 14 seconds (ref 11.4–15.2)

## 2017-09-22 LAB — MAGNESIUM: Magnesium: 2 mg/dL (ref 1.7–2.4)

## 2017-09-22 SURGERY — ARTERIOVENOUS (AV) FISTULA CREATION
Anesthesia: Monitor Anesthesia Care | Site: Arm Upper | Laterality: Right

## 2017-09-22 MED ORDER — LIDOCAINE-EPINEPHRINE (PF) 1 %-1:200000 IJ SOLN
INTRAMUSCULAR | Status: AC
  Filled 2017-09-22: qty 30

## 2017-09-22 MED ORDER — PROPOFOL 10 MG/ML IV BOLUS
INTRAVENOUS | Status: DC | PRN
  Administered 2017-09-22: 20 mg via INTRAVENOUS
  Administered 2017-09-22: 30 mg via INTRAVENOUS
  Administered 2017-09-22: 20 mg via INTRAVENOUS

## 2017-09-22 MED ORDER — FENTANYL CITRATE (PF) 100 MCG/2ML IJ SOLN
INTRAMUSCULAR | Status: AC
Start: 2017-09-22 — End: 2017-09-23
  Filled 2017-09-22: qty 2

## 2017-09-22 MED ORDER — ONDANSETRON HCL 4 MG/2ML IJ SOLN
INTRAMUSCULAR | Status: AC
  Filled 2017-09-22: qty 2

## 2017-09-22 MED ORDER — PROPOFOL 1000 MG/100ML IV EMUL
INTRAVENOUS | Status: AC
  Filled 2017-09-22: qty 100

## 2017-09-22 MED ORDER — HEPARIN SODIUM (PORCINE) 5000 UNIT/ML IJ SOLN
INTRAMUSCULAR | Status: AC
  Filled 2017-09-22: qty 1.2

## 2017-09-22 MED ORDER — MIDAZOLAM HCL 5 MG/5ML IJ SOLN
INTRAMUSCULAR | Status: DC | PRN
  Administered 2017-09-22: 2 mg via INTRAVENOUS

## 2017-09-22 MED ORDER — FENTANYL CITRATE (PF) 250 MCG/5ML IJ SOLN
INTRAMUSCULAR | Status: AC
  Filled 2017-09-22: qty 5

## 2017-09-22 MED ORDER — ONDANSETRON HCL 4 MG/2ML IJ SOLN: 4.0000 mg | Freq: Once | INTRAMUSCULAR | Status: DC | PRN

## 2017-09-22 MED ORDER — MIDAZOLAM HCL 2 MG/2ML IJ SOLN
INTRAMUSCULAR | Status: AC
  Filled 2017-09-22: qty 2

## 2017-09-22 MED ORDER — FENTANYL CITRATE (PF) 100 MCG/2ML IJ SOLN
INTRAMUSCULAR | Status: DC | PRN
  Administered 2017-09-22: 50 ug via INTRAVENOUS

## 2017-09-22 MED ORDER — PROPOFOL 500 MG/50ML IV EMUL
INTRAVENOUS | Status: DC | PRN
  Administered 2017-09-22: 75 ug/kg/min via INTRAVENOUS

## 2017-09-22 MED ORDER — SODIUM CHLORIDE 0.9 % IV SOLN
INTRAVENOUS | Status: DC
  Administered 2017-09-22: 13:00:00 via INTRAVENOUS

## 2017-09-22 MED ORDER — 0.9 % SODIUM CHLORIDE (POUR BTL) OPTIME
TOPICAL | Status: DC | PRN
  Administered 2017-09-22: 1000 mL

## 2017-09-22 MED ORDER — LIDOCAINE-EPINEPHRINE (PF) 1 %-1:200000 IJ SOLN
INTRAMUSCULAR | Status: DC | PRN
  Administered 2017-09-22: 15 mL

## 2017-09-22 MED ORDER — SODIUM CHLORIDE 0.9 % IV SOLN
INTRAVENOUS | Status: DC | PRN
  Administered 2017-09-22: 500 mL

## 2017-09-22 MED ORDER — PROPOFOL 10 MG/ML IV BOLUS
INTRAVENOUS | Status: AC
  Filled 2017-09-22: qty 20

## 2017-09-22 MED ORDER — FENTANYL CITRATE (PF) 100 MCG/2ML IJ SOLN
25.0000 ug | INTRAMUSCULAR | Status: DC | PRN
  Administered 2017-09-22: 50 ug via INTRAVENOUS

## 2017-09-22 MED ORDER — ONDANSETRON HCL 4 MG/2ML IJ SOLN
INTRAMUSCULAR | Status: DC | PRN
  Administered 2017-09-22: 4 mg via INTRAVENOUS

## 2017-09-22 SURGICAL SUPPLY — 34 items
ADH SKN CLS APL DERMABOND .7 (GAUZE/BANDAGES/DRESSINGS) ×1
ARMBAND PINK RESTRICT EXTREMIT (MISCELLANEOUS) ×3 IMPLANT
CANISTER SUCT 3000ML PPV (MISCELLANEOUS) ×3 IMPLANT
CLIP VESOCCLUDE MED 6/CT (CLIP) ×3 IMPLANT
CLIP VESOCCLUDE SM WIDE 6/CT (CLIP) ×3 IMPLANT
COVER PROBE W GEL 5X96 (DRAPES) ×2 IMPLANT
DECANTER SPIKE VIAL GLASS SM (MISCELLANEOUS) ×2 IMPLANT
DERMABOND ADVANCED (GAUZE/BANDAGES/DRESSINGS) ×2
DERMABOND ADVANCED .7 DNX12 (GAUZE/BANDAGES/DRESSINGS) ×1 IMPLANT
ELECT REM PT RETURN 9FT ADLT (ELECTROSURGICAL) ×3
ELECTRODE REM PT RTRN 9FT ADLT (ELECTROSURGICAL) ×1 IMPLANT
GLOVE BIO SURGEON STRL SZ 6.5 (GLOVE) ×1 IMPLANT
GLOVE BIO SURGEON STRL SZ7.5 (GLOVE) ×3 IMPLANT
GLOVE BIO SURGEONS STRL SZ 6.5 (GLOVE) ×1
GLOVE BIOGEL M 6.5 STRL (GLOVE) ×6 IMPLANT
GLOVE ECLIPSE 6.5 STRL STRAW (GLOVE) ×2 IMPLANT
GLOVE SS BIOGEL STRL SZ 7 (GLOVE) IMPLANT
GLOVE SUPERSENSE BIOGEL SZ 7 (GLOVE) ×2
GOWN STRL REUS W/ TWL LRG LVL3 (GOWN DISPOSABLE) ×2 IMPLANT
GOWN STRL REUS W/ TWL XL LVL3 (GOWN DISPOSABLE) ×1 IMPLANT
GOWN STRL REUS W/TWL LRG LVL3 (GOWN DISPOSABLE) ×9
GOWN STRL REUS W/TWL XL LVL3 (GOWN DISPOSABLE) ×3
KIT BASIN OR (CUSTOM PROCEDURE TRAY) ×3 IMPLANT
KIT TURNOVER KIT B (KITS) ×3 IMPLANT
NS IRRIG 1000ML POUR BTL (IV SOLUTION) ×3 IMPLANT
PACK CV ACCESS (CUSTOM PROCEDURE TRAY) ×3 IMPLANT
PAD ARMBOARD 7.5X6 YLW CONV (MISCELLANEOUS) ×6 IMPLANT
SUT MNCRL AB 4-0 PS2 18 (SUTURE) ×3 IMPLANT
SUT PROLENE 6 0 BV (SUTURE) ×3 IMPLANT
SUT VIC AB 3-0 SH 27 (SUTURE) ×3
SUT VIC AB 3-0 SH 27X BRD (SUTURE) ×1 IMPLANT
TOWEL GREEN STERILE (TOWEL DISPOSABLE) ×3 IMPLANT
UNDERPAD 30X30 (UNDERPADS AND DIAPERS) ×3 IMPLANT
WATER STERILE IRR 1000ML POUR (IV SOLUTION) ×3 IMPLANT

## 2017-09-22 NOTE — Progress Notes (Signed)
PROGRESS NOTE    Diana Walters  RDE:[id number] DOB: 09/15/1955 DOA: 09/07/2017 PCP: Renato Shin, MD    Brief Narrative:    62 year old female who presented with weakness and back pain. She does have significant past medical history for high-grade thoracic aneurysm repair 2014 ([facility]),anemia, asthma, osteoarthritis, rheumatoid arthritis, tobacco abuse, hypertension, dyslipidemia, seizures, CVA, CKDand depression. She was diagnosed with severe anemia, hemoglobin 4.6 and advised to come to the emergency room department.On further questioning reported intermittent pain between her shoulder blades for the last 2 weeks. CT angiography of her chest showed aortic endograft extending from the ascending to the lower descending thoracic aorta. Evidence of acute endoleak at the level of the thoracic arch with active extravasation of contrast material into the native aneurysm sac causing enlargement of the native sac. Extensive hematoma in the mediastinum around the arch and descending aorta. Left pleural effusion. No evidence of aneurysm or dissection in the abdominal aorta.  Patient was admitted to the intensive care unit with a working diagnosis of acute blood loss anemia due to extensive mediastinal hematoma related to aortic aneurysmendoleak.   07/25.Vascular repair of aortic arch and descending thoracic aorta with proximal 34 x 200 mm extended into the descending aorta with 34 x 100 mm extension.  Postoperatively he has developed acute kidney injury. Nephrology consulted, and plan for permanent access by vascular surgery .   Assessment & Plan:   Active Problems:   Symptomatic anemia   Thoracic aortic endovascular leak with acute blood loss anemia due to mediastinal hematoma: Pt is chest pain free , hemoglobin stable.  Recommend outpatient follow up with a non contrast CT to eval thoracic aneurysm.  Optimal blood pressure.    Acute on Stage 5 CKD with hyperkalemia:    Nephrology on board, plan for permanent access today.  Resume [place] for hyperkalemia.  Vascular surgery to do a AVG vs tunneled HD catheter.     Leukocytosis: Improving.  Possibly reactive    RA;  No new complaints.    Diabetes Mellitus:  CBG (last 3)  Recent Labs    09/21/17 1627 09/21/17 2152 09/22/17 0805  GLUCAP 111* 166* 87   Resume SSI.   Anemia of acute blood loss superimposed on anemia of chronic disease secondary to stage V renal disease Hemoglobin stable between 10 to 11.  Transfuse to keep hemoglobin greater than 7.    H/o stroke: Stable.    DVT prophylaxis:sq heparin.  Code Status: full code.  Family Communication: none at bedside.  Disposition Plan: pending clinical improvement.   Consultants:   Vascular surgery Dr Donzetta Matters   Nephrology Dr Joelyn Oms.    Procedures: AVG today by Dr Donzetta Matters.   Antimicrobials: one dose of ancef.   Subjective:  no chest pain or sob. No nausea or vomiting.   Objective: Vitals:   09/21/17 2300 09/22/17 0300 09/22/17 0500 09/22/17 0927  BP: (!) 140/51 (!) 162/53  (!) 146/69  Pulse: 66 67  73  Resp: 12 14  17   Temp: 98.3 F (36.8 C) 98.3 F (36.8 C)  98.3 F (36.8 C)  TempSrc: Oral Oral  Oral  SpO2: 99% 98%  100%  Weight:   67.7 kg   Height:       No intake or output data in the 24 hours ending 09/22/17 1036 Filed Weights   09/20/17 0300 09/21/17 0300 09/22/17 0500  Weight: 69.1 kg 69.3 kg 67.7 kg    Examination:  General exam: Appears calm and comfortable  Respiratory system:  Clear to auscultation. Respiratory effort normal. Cardiovascular system: S1 & S2 heard, RRR. No JVD,. No pedal edema. Gastrointestinal system: Abdomen is nondistended, soft and nontender. No organomegaly or masses felt. Normal bowel sounds heard. Central nervous system: Alert and oriented. No focal neurological deficits. Extremities: Symmetric 5 x 5 power. Skin: No rashes, lesions or ulcers Psychiatry:Mood & affect  appropriate.     Data Reviewed: I have personally reviewed following labs and imaging studies  CBC: Recent Labs  Lab 09/21/17 0229 09/22/17 0223  WBC 14.2* 12.9*  NEUTROABS 10.7*  --   HGB 11.3* 10.4*  HCT 36.4 33.4*  MCV 90.3 88.6  PLT 198 242   Basic Metabolic Panel: Recent Labs  Lab 09/18/17 0221 09/19/17 0229 09/20/17 0231 09/21/17 0229 09/22/17 0223  NA 135 134* 133* 135 137  K 5.2* 6.2* 4.6 5.1 4.7  CL 104 102 100 101 103  CO2 23 21* 21* 20* 23  GLUCOSE 101* 126* 120* 94 93  BUN 95* 102* 111* 119* 118*  CREATININE 4.31* 4.35* 4.57* 4.61* 4.61*  CALCIUM 7.9* 8.1* 7.8* 7.9* 8.0*  MG  --   --  2.0  --   --    GFR: Estimated Creatinine Clearance: 13.3 mL/min (A) (by C-G formula based on SCr of 4.61 mg/dL (H)). Liver Function Tests: Recent Labs  Lab 09/22/17 0223  AST 14*  ALT 9  ALKPHOS 68  BILITOT 0.7  PROT 5.8*  ALBUMIN 2.3*   No results for input(s): LIPASE, AMYLASE in the last 168 hours. No results for input(s): AMMONIA in the last 168 hours. Coagulation Profile: Recent Labs  Lab 09/22/17 0223  INR 1.09   Cardiac Enzymes: No results for input(s): CKTOTAL, CKMB, CKMBINDEX, TROPONINI in the last 168 hours. BNP (last 3 results) No results for input(s): PROBNP in the last 8760 hours. HbA1C: No results for input(s): HGBA1C in the last 72 hours. CBG: Recent Labs  Lab 09/21/17 0831 09/21/17 1214 09/21/17 1627 09/21/17 2152 09/22/17 0805  GLUCAP 77 94 111* 166* 87   Lipid Profile: No results for input(s): CHOL, HDL, LDLCALC, TRIG, CHOLHDL, LDLDIRECT in the last 72 hours. Thyroid Function Tests: No results for input(s): TSH, T4TOTAL, FREET4, T3FREE, THYROIDAB in the last 72 hours. Anemia Panel: No results for input(s): VITAMINB12, FOLATE, FERRITIN, TIBC, IRON, RETICCTPCT in the last 72 hours. Sepsis Labs: No results for input(s): PROCALCITON, LATICACIDVEN in the last 168 hours.  No results found for this or any previous visit (from the  past 240 hour(s)).       Radiology Studies: No results found.      Scheduled Meds: . amLODipine  10 mg Oral Daily  . atorvastatin  40 mg Oral q1800  . docusate sodium  100 mg Oral Daily  . famotidine  20 mg Oral Daily  . feeding supplement (NEPRO CARB STEADY)  237 mL Oral QHS  . furosemide  160 mg Oral Daily  . heparin  5,000 Units Subcutaneous Q8H  . hydrALAZINE  75 mg Oral BID  . insulin aspart  0-9 Units Subcutaneous TID WC  . metoprolol tartrate  12.5 mg Oral Daily  . patiromer  8.4 g Oral Daily  . polyethylene glycol  17 g Oral BID  . sodium chloride flush  10-40 mL Intracatheter Q12H   Continuous Infusions: . sodium chloride    .  ceFAZolin (ANCEF) IV       LOS: 14 days    Time spent: 35 minutes    Hosie Poisson, MD [facility]  Pager [phone] If 7PM-7AM, please contact night-coverage [email or web address] Password [email or web address] 09/22/2017, 10:36 AM

## 2017-09-22 NOTE — Anesthesia Preprocedure Evaluation (Addendum)
Anesthesia Evaluation  Patient identified by MRN, date of birth, ID band Patient awake    Reviewed: Allergy & Precautions, NPO status , Patient's Chart, lab work & pertinent test results, reviewed documented beta blocker date and time   History of Anesthesia Complications Negative for: history of anesthetic complications  Airway Mallampati: III  TM Distance: >3 FB Neck ROM: Full    Dental  (+) Poor Dentition   Pulmonary COPD, Current Smoker,    breath sounds clear to auscultation       Cardiovascular hypertension, Pt. on medications and Pt. on home beta blockers + Peripheral Vascular Disease   Rhythm:Regular Rate:Normal + Systolic murmurs  '19 TTE - Moderate concentric LVH. EF 50% to 55%. Grade 2 diastolic dysfunction. Trivial AI. Severe posterior MAC with mild MR. Severely dilated LA. RA was moderately dilated.  Moderate TR. PASP 32 mmHg.   EKG - SB 51bpm, LVH   Neuro/Psych Seizures -,  Anxiety Depression CVA    GI/Hepatic negative GI ROS, (+)     substance abuse  marijuana use,   Endo/Other  negative endocrine ROS  Renal/GU ESRFRenal disease  negative genitourinary   Musculoskeletal  (+) Arthritis ,   Abdominal   Peds  Hematology  (+) anemia ,   Anesthesia Other Findings Left arm notably swollen, but soft and nontender. No signs of induration at IV site. Patient states arm was swollen prior to IV placement.  Reproductive/Obstetrics                           Anesthesia Physical  Anesthesia Plan  ASA: IV  Anesthesia Plan: MAC   Post-op Pain Management:    Induction: Intravenous  PONV Risk Score and Plan: 2 and Treatment may vary due to age or medical condition and Propofol infusion  Airway Management Planned: Natural Airway and Simple Face Mask  Additional Equipment: None  Intra-op Plan:   Post-operative Plan:   Informed Consent: I have reviewed the patients History and  Physical, chart, labs and discussed the procedure including the risks, benefits and alternatives for the proposed anesthesia with the patient or authorized representative who has indicated his/her understanding and acceptance.     Plan Discussed with: CRNA and Anesthesiologist  Anesthesia Plan Comments:        Anesthesia Quick Evaluation

## 2017-09-22 NOTE — Anesthesia Postprocedure Evaluation (Signed)
Anesthesia Post Note  Patient: Diana Walters  Procedure(s) Performed: ARTERIOVENOUS (AV) FISTULA CREATION (Right Arm Upper)     Patient location during evaluation: PACU Anesthesia Type: MAC Level of consciousness: awake and alert Pain management: pain level controlled Vital Signs Assessment: post-procedure vital signs reviewed and stable Respiratory status: spontaneous breathing, nonlabored ventilation, respiratory function stable and patient connected to nasal cannula oxygen Cardiovascular status: stable and blood pressure returned to baseline Postop Assessment: no apparent nausea or vomiting Anesthetic complications: no    Last Vitals:  Vitals:   09/22/17 1544 09/22/17 2032  BP: (!) 142/61 (!) 148/58  Pulse: 62 61  Resp: 14 13  Temp: 36.6 C 36.7 C  SpO2: 96% 98%    Last Pain:  Vitals:   09/22/17 2037  TempSrc:   PainSc: 8                  Barnet Glasgow

## 2017-09-22 NOTE — Transfer of Care (Signed)
Immediate Anesthesia Transfer of Care Note  Patient: Diana Walters  Procedure(s) Performed: ARTERIOVENOUS (AV) FISTULA CREATION (Right Arm Upper)  Patient Location: PACU  Anesthesia Type:MAC  Level of Consciousness: awake, alert  and oriented  Airway & Oxygen Therapy: Patient Spontanous Breathing and Patient connected to face mask oxygen  Post-op Assessment: Report given to RN and Post -op Vital signs reviewed and stable  Post vital signs: Reviewed and stable  Last Vitals:  Vitals Value Taken Time  BP    Temp    Pulse 88 09/22/2017  2:45 PM  Resp 18 09/22/2017  2:45 PM  SpO2 94 % 09/22/2017  2:45 PM  Vitals shown include unvalidated device data.  Last Pain:  Vitals:   09/22/17 0927  TempSrc: Oral  PainSc:       Patients Stated Pain Goal: 2 (34/75/83 0746)  Complications: No apparent anesthesia complications

## 2017-09-22 NOTE — Op Note (Signed)
    Patient name: Diana Walters MRN: [id number] DOB: 1955-06-13 Sex: female   09/22/2017 Pre-operative Diagnosis: Chronic kidney disease stage Post-operative diagnosis:  Same Surgeon:  Eda Paschal. Donzetta Matters, MD Procedure Performed: Right arm for stage basilic vein fistula  Indications:  62 year old female with a history of chronic kidney disease. She was recently admitted with an acute endoleak and underwent thoracic and a grafting. She is adequately had progressive renal failure with azotemia. She is indicated for permanent access for dialysis.  Findings: Basilic vein branches above the antecubital into 3 separate veins which were much smaller at the antecubital. The vein did easily dilated up to 4 mm. A completion there was a good signal and the vein could not palpate a good thrill but there was a strong signal as well as a palpable radial pulse in the wrist.   Procedure:  The patient was identified in the holding area and taken to the operating room where she was placed supine on the operating table and Mac anesthesia was induced. She still prepped and draped in the l right upper extremity in the usual fashion she was given antibiotics for a timeout was called. We used ultrasound to evaluate her veins are upper extremity we did demonstrate good basilic vein above the antecubital after it had branched. There was a three-vessel confluence there. I then made an incision between that and the palpable radial pulse after anesthetizing the area with 1% lidocaine with epinephrine to a total of 9 mL. A second vein identified and marked for orientation. I then dissected out of brachial artery placed a vessel loop around this. The 3 vein branches were then clipped distally and divided the vein was then spatulated dilated to 4 mm/I saline and clamped. I then clamped the artery distally and proximally opened longitudinally. Mesially in both directions. The vein was inserted and decide with 6-0 Prolene suture. Prior to  completion of the anastomosis we allowed flushing all directions and irrigated with heparinized saline. On completion the vein did get much larger although I could not feel thrill. It did have a good continuous signal by Doppler and was a palpable radial pulse the wrist. We freed up some of the soft tissue around the nerve on the basilic vein up under the tunnel. We then obtained hemostasis closed in 2 layers of Vicryl Monocryl. She was awakened from anesthesia having tolerated the procedure well without immediate complication. All counts were correct at completion.  EBL 20 mL.    Carletha Dawn C. Donzetta Matters, MD [facility] Office: [phone] Pager: [phone]

## 2017-09-22 NOTE — Progress Notes (Signed)
  Progress Note    09/22/2017 1:04 PM Day of Surgery  Subjective: No new complaints  Vitals:   09/22/17 0300 09/22/17 0927  BP: (!) 162/53 (!) 146/69  Pulse: 67 73  Resp: 14 17  Temp: 98.3 F (36.8 C) 98.3 F (36.8 C)  SpO2: 98% 100%    Physical Exam: Awake alert oriented Nonlabored respirations Palpable radial pulse on the right  CBC    Component Value Date/Time   WBC 12.9 (H) 09/22/2017 0223   RBC 3.77 (L) 09/22/2017 0223   HGB 10.4 (L) 09/22/2017 0223   HGB 10.3 (L) 06/22/2017 1106   HCT 33.4 (L) 09/22/2017 0223   HCT 31.8 (L) 06/22/2017 1106   PLT 171 09/22/2017 0223   PLT 304 06/22/2017 1106   MCV 88.6 09/22/2017 0223   MCV 77 (L) 06/22/2017 1106   MCH 27.6 09/22/2017 0223   MCHC 31.1 09/22/2017 0223   RDW 23.9 (H) 09/22/2017 0223   RDW 21.7 (H) 06/22/2017 1106   LYMPHSABS 1.6 09/21/2017 0229   LYMPHSABS 1.9 01/19/2017 1128   MONOABS 1.6 (H) 09/21/2017 0229   EOSABS 0.3 09/21/2017 0229   EOSABS 0.3 01/19/2017 1128   BASOSABS 0.0 09/21/2017 0229   BASOSABS 0.0 01/19/2017 1128    BMET    Component Value Date/Time   NA 137 09/22/2017 0223   NA 141 06/22/2017 1106   K 4.7 09/22/2017 0223   CL 103 09/22/2017 0223   CO2 23 09/22/2017 0223   GLUCOSE 93 09/22/2017 0223   BUN 118 (H) 09/22/2017 0223   BUN 31 (H) 06/22/2017 1106   CREATININE 4.61 (H) 09/22/2017 0223   CREATININE 1.10 06/10/2014 0813   CALCIUM 8.0 (L) 09/22/2017 0223   CALCIUM 7.6 (L) 06/09/2017 0230   GFRNONAA 9 (L) 09/22/2017 0223   GFRAA 11 (L) 09/22/2017 0223    INR    Component Value Date/Time   INR 1.09 09/22/2017 0223     Intake/Output Summary (Last 24 hours) at 09/22/2017 1304 Last data filed at 09/22/2017 1103 Gross per 24 hour  Intake -  Output 850 ml  Net -850 ml     Assessment/plan:  62 y.o. female is s/p thoracic endograft now with CKD 5.  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 Donzetta Matters, MD [facility] Office: [phone] Pager: [phone]  09/22/2017 1:04 PM

## 2017-09-22 NOTE — Progress Notes (Signed)
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    Subjective:  . For vascular access today . Cleared by PT and OT for home with supervision . Stable renal function, creatinine 4.6, BUN 118, potassium 4.7 . Minimal uremic symptoms at the current time . Adequate urine output on high-dose furosemide, once daily . Stable weights, minimal edema  No intake/output data recorded.  Filed Weights   09/20/17 0300 09/21/17 0300 09/22/17 0500  Weight: 69.1 kg 69.3 kg 67.7 kg    Scheduled Meds: . amLODipine  10 mg Oral Daily  . atorvastatin  40 mg Oral q1800  . docusate sodium  100 mg Oral Daily  . famotidine  20 mg Oral Daily  . feeding supplement (NEPRO CARB STEADY)  237 mL Oral QHS  . furosemide  160 mg Oral Daily  . heparin  5,000 Units Subcutaneous Q8H  . hydrALAZINE  75 mg Oral BID  . insulin aspart  0-9 Units Subcutaneous TID WC  . metoprolol tartrate  12.5 mg Oral Daily  . patiromer  8.4 g Oral Daily  . polyethylene glycol  17 g Oral BID  . sodium chloride flush  10-40 mL Intracatheter Q12H   Continuous Infusions: . sodium chloride    .  ceFAZolin (ANCEF) IV     PRN Meds:.sodium chloride, ondansetron (ZOFRAN) IV, oxyCODONE-acetaminophen, sodium chloride flush  Current Labs: reviewed    Physical Exam:  Blood pressure (!) 146/69, pulse 73, temperature 98.3 F (36.8 C), temperature source Oral, resp. rate 17, height 5\' 10"  (1.778 m), weight 67.7 kg, SpO2 100 %.  NAD 15deg in bed breathing normally 3/6 MSM best at RUSB, normal rate and rhythm CTAB NO LEE  A 1. AoCKD now stage 5; stable SCr but with significant azotemia 2. Hyperkalemia, Mild; ? If from resolution of clot; on patiromer 3. 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 HTN, not controlled 5. Hx/o CVA 6. DM2  P . AVG versus AVF today  . Continue furosemide once daily, . If stable tomorrow, anticipate would be safe for discharge . We will set up  follow-up with Dr. Lorrene Reid, including labs . Cont Veltassa 8.4gm daily for K control; cont if K > 4.5 . Daily weights, Daily Renal Panel, Strict I/Os, Avoid nephrotoxins (NSAIDs, judicious IV Contrast)    Pearson Grippe MD 09/22/2017, 11:32 AM  Recent Labs  Lab 09/20/17 0231 09/21/17 0229 09/22/17 0223  NA 133* 135 137  K 4.6 5.1 4.7  CL 100 101 103  CO2 21* 20* 23  GLUCOSE 120* 94 93  BUN 111* 119* 118*  CREATININE 4.57* 4.61* 4.61*  CALCIUM 7.8* 7.9* 8.0*   Recent Labs  Lab 09/21/17 0229 09/22/17 0223  WBC 14.2* 12.9*  NEUTROABS 10.7*  --   HGB 11.3* 10.4*  HCT 36.4 33.4*  MCV 90.3 88.6  PLT 198 171

## 2017-09-23 ENCOUNTER — Telehealth: Payer: Self-pay | Admitting: Vascular Surgery

## 2017-09-23 ENCOUNTER — Encounter (HOSPITAL_COMMUNITY): Payer: Self-pay | Admitting: Vascular Surgery

## 2017-09-23 LAB — BASIC METABOLIC PANEL
ANION GAP: 14 (ref 5–15)
BUN: 121 mg/dL — ABNORMAL HIGH (ref 8–23)
CALCIUM: 8.2 mg/dL — AB (ref 8.9–10.3)
CO2: 20 mmol/L — AB (ref 22–32)
Chloride: 102 mmol/L (ref 98–111)
Creatinine, Ser: 4.68 mg/dL — ABNORMAL HIGH (ref 0.44–1.00)
GFR calc Af Amer: 11 mL/min — ABNORMAL LOW (ref >60)
GFR, EST NON AFRICAN AMERICAN: 9 mL/min — AB (ref >60)
Glucose, Bld: 97 mg/dL (ref 70–99)
Potassium: 4.5 mmol/L (ref 3.5–5.1)
Sodium: 136 mmol/L (ref 135–145)

## 2017-09-23 LAB — GLUCOSE, CAPILLARY
Glucose-Capillary: 89 mg/dL (ref 70–99)
Glucose-Capillary: 101 mg/dL — ABNORMAL HIGH (ref 70–99)

## 2017-09-23 MED ORDER — FUROSEMIDE 80 MG PO TABS: 160.0000 mg | ORAL_TABLET | Freq: Every day | ORAL | 0 refills | Status: AC

## 2017-09-23 MED ORDER — FAMOTIDINE 20 MG PO TABS: 20.0000 mg | ORAL_TABLET | Freq: Every day | ORAL | 0 refills | Status: AC

## 2017-09-23 MED ORDER — METOPROLOL TARTRATE 25 MG PO TABS: 12.5000 mg | ORAL_TABLET | Freq: Every day | ORAL | 0 refills | Status: AC

## 2017-09-23 MED ORDER — OXYCODONE-ACETAMINOPHEN 5-325 MG PO TABS: 1.0000 | ORAL_TABLET | ORAL | 0 refills | Status: AC | PRN

## 2017-09-23 MED ORDER — PATIROMER SORBITEX CALCIUM 8.4 G PO PACK: 8.4000 g | PACK | Freq: Every day | ORAL | 0 refills | Status: AC

## 2017-09-23 MED ORDER — NEPRO/CARBSTEADY PO LIQD: 237.0000 mL | Freq: Every day | ORAL | 0 refills | Status: AC

## 2017-09-23 NOTE — Plan of Care (Signed)
  Problem: Education: Goal: Knowledge of General Education information will improve Description: Including pain rating scale, medication(s)/side effects and non-pharmacologic comfort measures Outcome: Adequate for Discharge   Problem: Health Behavior/Discharge Planning: Goal: Ability to manage health-related needs will improve Outcome: Adequate for Discharge   Problem: Clinical Measurements: Goal: Ability to maintain clinical measurements within normal limits will improve Outcome: Adequate for Discharge Goal: Will remain free from infection Outcome: Adequate for Discharge Goal: Diagnostic test results will improve Outcome: Adequate for Discharge Goal: Cardiovascular complication will be avoided Outcome: Adequate for Discharge   Problem: Activity: Goal: Risk for activity intolerance will decrease Outcome: Adequate for Discharge   Problem: Coping: Goal: Level of anxiety will decrease Outcome: Adequate for Discharge   Problem: Elimination: Goal: Will not experience complications related to bowel motility Outcome: Adequate for Discharge Goal: Will not experience complications related to urinary retention Outcome: Adequate for Discharge   Problem: Pain Managment: Goal: General experience of comfort will improve Outcome: Adequate for Discharge   Problem: Safety: Goal: Ability to remain free from injury will improve Outcome: Adequate for Discharge   Problem: Skin Integrity: Goal: Risk for impaired skin integrity will decrease Outcome: Adequate for Discharge   

## 2017-09-23 NOTE — Progress Notes (Signed)
  Progress Note    09/23/2017 8:16 AM 1 Day Post-Op  Subjective: No acute issues  Vitals:   09/23/17 0321 09/23/17 0734  BP: (!) 144/63 (!) 147/55  Pulse: 64 60  Resp: 15 14  Temp: 98.3 F (36.8 C) 98.3 F (36.8 C)  SpO2: 98% 100%    Physical Exam: Awake alert oriented Palpable thrill right upper extremity Left arm remains edematous Right groin soft without hematoma  CBC    Component Value Date/Time   WBC 12.9 (H) 09/22/2017 0223   RBC 3.77 (L) 09/22/2017 0223   HGB 10.4 (L) 09/22/2017 0223   HGB 10.3 (L) 06/22/2017 1106   HCT 33.4 (L) 09/22/2017 0223   HCT 31.8 (L) 06/22/2017 1106   PLT 171 09/22/2017 0223   PLT 304 06/22/2017 1106   MCV 88.6 09/22/2017 0223   MCV 77 (L) 06/22/2017 1106   MCH 27.6 09/22/2017 0223   MCHC 31.1 09/22/2017 0223   RDW 23.9 (H) 09/22/2017 0223   RDW 21.7 (H) 06/22/2017 1106   LYMPHSABS 1.6 09/21/2017 0229   LYMPHSABS 1.9 01/19/2017 1128   MONOABS 1.6 (H) 09/21/2017 0229   EOSABS 0.3 09/21/2017 0229   EOSABS 0.3 01/19/2017 1128   BASOSABS 0.0 09/21/2017 0229   BASOSABS 0.0 01/19/2017 1128    BMET    Component Value Date/Time   NA 137 09/22/2017 0223   NA 141 06/22/2017 1106   K 4.7 09/22/2017 0223   CL 103 09/22/2017 0223   CO2 23 09/22/2017 0223   GLUCOSE 93 09/22/2017 0223   BUN 118 (H) 09/22/2017 0223   BUN 31 (H) 06/22/2017 1106   CREATININE 4.61 (H) 09/22/2017 0223   CREATININE 1.10 06/10/2014 0813   CALCIUM 8.0 (L) 09/22/2017 0223   CALCIUM 7.6 (L) 06/09/2017 0230   GFRNONAA 9 (L) 09/22/2017 0223   GFRAA 11 (L) 09/22/2017 0223    INR    Component Value Date/Time   INR 1.09 09/22/2017 0223     Intake/Output Summary (Last 24 hours) at 09/23/2017 0816 Last data filed at 09/23/2017 0700 Gross per 24 hour  Intake 590 ml  Output 1455 ml  Net -865 ml     Assessment:  62 y.o. female is s/p thoracic endograft for acute endoleak now right upper extremity first stage basilic vein fistula.  Plan: Fistula  appears to be working well we will get a duplex when she follows up in the office She has a CT scan without contrast on September 13 and if she becomes end-stage renal we will switch this to have contrast. She is okay for discharge from vascular standpoint.   Brandon C. Donzetta Matters, MD [facility] Office: [phone] Pager: [phone]  09/23/2017 8:16 AM

## 2017-09-23 NOTE — Progress Notes (Signed)
Physical Therapy Treatment Patient Details Name: Diana Walters MRN: [id number] DOB: September 19, 1955 Today's Date: 09/23/2017    History of Present Illness 62 y.o. female s/p TEVAR 7/25. Pt also presenting with severe L knee pain (per pt report due to RA and lack of being able to take her RA meds). PMH includes: CVA, HTN, Seizure, AKI thoracic aortic aneurysm, RA    PT Comments    Session focused on family education and reinforcing stair training with patient and daughter. Discussed proper use of gait belt, safe transfers, level of guarding and supervision to prevent falls at home. Pt requires mod A to stand due to pain at this session. Pt given gait belt for home use. Pt to d/c home today, family and pt report no concerns, rec HHPT for continued therapy.      Follow Up Recommendations  Home health PT;Supervision/Assistance - 24 hour     Equipment Recommendations  Rolling walker with 5" wheels;3in1 (PT)    Recommendations for Other Services       Precautions / Restrictions Precautions Precautions: Fall Precaution Comments: Lt knee pain  Restrictions Weight Bearing Restrictions: No    Mobility  Bed Mobility               General bed mobility comments: OOB in chair  Transfers Overall transfer level: Needs assistance Equipment used: Rolling walker (2 wheeled) Transfers: Sit to/from Stand Sit to Stand: Mod assist         General transfer comment: emphasis on cueing daugther for handling with gait belt and level of assist.   Ambulation/Gait                 Stairs             Wheelchair Mobility    Modified Rankin (Stroke Patients Only)       Balance           Standing balance support: Bilateral upper extremity supported Standing balance-Leahy Scale: Poor                              Cognition Arousal/Alertness: Awake/alert Behavior During Therapy: WFL for tasks assessed/performed Overall Cognitive Status: Impaired/Different  from baseline                                 General Comments: Pt is anxious re: knee pain       Exercises      General Comments General comments (skin integrity, edema, etc.): Extensive time discussing with daugther stairs, guarding, use of gait belt, level of superivison needed at home to prevent falls. reinforced stair sequencing with pt and daughter today.       Pertinent Vitals/Pain Pain Assessment: Faces Faces Pain Scale: Hurts even more Pain Location: L knee Pain Descriptors / Indicators: Grimacing;Guarding Pain Intervention(s): Limited activity within patient's tolerance    Home Living                      Prior Function            PT Goals (current goals can now be found in the care plan section) Acute Rehab PT Goals PT Goal Formulation: With patient Time For Goal Achievement: 09/27/17 Potential to Achieve Goals: Good Progress towards PT goals: Progressing toward goals    Frequency    Min 3X/week      PT Plan  Current plan remains appropriate    Co-evaluation              AM-PAC PT "6 Clicks" Daily Activity  Outcome Measure  Difficulty turning over in bed (including adjusting bedclothes, sheets and blankets)?: A Lot Difficulty moving from lying on back to sitting on the side of the bed? : A Lot Difficulty sitting down on and standing up from a chair with arms (e.g., wheelchair, bedside commode, etc,.)?: A Lot Help needed moving to and from a bed to chair (including a wheelchair)?: A Little Help needed walking in hospital room?: A Little Help needed climbing 3-5 steps with a railing? : A Little 6 Click Score: 15    End of Session Equipment Utilized During Treatment: Gait belt Activity Tolerance: Patient limited by pain Patient left: in chair;with call bell/phone within reach;with nursing/sitter in room Nurse Communication: Mobility status PT Visit Diagnosis: Unsteadiness on feet (R26.81);Pain;Difficulty in walking, not  elsewhere classified (R26.2) Pain - Right/Left: Left Pain - part of body: Knee     Time: 1130-1146 PT Time Calculation (min) (ACUTE ONLY): 16 min  Charges:  $Self Care/Home Management: 8-22                     Reinaldo Berber, PT, DPT Acute Rehab Services Pager: [phone]    Reinaldo Berber 09/23/2017, 11:46 AM

## 2017-09-23 NOTE — Progress Notes (Signed)
Occupational Therapy Treatment Patient Details Name: Diana Walters MRN: [id number] DOB: 27-Apr-1955 Today's Date: 09/23/2017    History of present illness 62 y.o. female s/p TEVAR 7/25. Pt also presenting with severe L knee pain (per pt report due to RA and lack of being able to take her RA meds). PMH includes: CVA, HTN, Seizure, AKI thoracic aortic aneurysm, RA   OT comments  Pt eager for discharge.  Continues to be limited due to Lt knee pain.  Discussed activity modification with pt.  She has AE, and reports family will assist her as needed.   Do feel she will benefit from 3in1 commode, and she agrees.  Defer determination of need for tub/shower seat to [place]  Follow Up Recommendations  Home health OT;Supervision/Assistance - 24 hour;Other (comment)    Equipment Recommendations  3 in 1 bedside commode    Recommendations for Other Services      Precautions / Restrictions Precautions Precautions: Fall Precaution Comments: Lt knee pain  Restrictions Weight Bearing Restrictions: No       Mobility Bed Mobility                  Transfers                      Balance           Standing balance support: Bilateral upper extremity supported Standing balance-Leahy Scale: Poor                             ADL either performed or assessed with clinical judgement   ADL Overall ADL's : Needs assistance/impaired                     Lower Body Dressing: Moderate assistance;Sit to/from stand Lower Body Dressing Details (indicate cue type and reason): Pt able to don/doff socks, shoes pants over Rt foot, requires assist for Lt LE due to increased pain.  She reports she has a sock aid and is able to verbalize how to safely use it, but reports it's diffcult at times due to the knee pain.   She reports family will assist her as needed  Toilet Transfer: Min Geophysical data processor Details (indicate cue type and reason): Pt does not have  3in1 commode, but feels it would be helpful - CM notified  Toileting- Clothing Manipulation and Hygiene: Min guard       Functional mobility during ADLs: Therapist, art      Cognition Arousal/Alertness: Awake/alert Behavior During Therapy: WFL for tasks assessed/performed Overall Cognitive Status: Impaired/Different from baseline                                 General Comments: Pt is anxious re: knee pain         Exercises     Shoulder Instructions       General Comments Pt limited by Lt knee pain.  She is eager to discharge home, and reports she feels confident with level of assist she has at home     Pertinent Vitals/ Pain       Pain Assessment: 0-10 Faces Pain Scale: Hurts even more Pain Location: L knee Pain Descriptors / Indicators: Grimacing;Guarding Pain Intervention(s): Limited activity within patient's tolerance  Home Living                                          Prior Functioning/Environment              Frequency  Min 2X/week        Progress Toward Goals  OT Goals(current goals can now be found in the care plan section)  Progress towards OT goals: Progressing toward goals     Plan Equipment recommendations need to be updated    Co-evaluation                 AM-PAC PT "6 Clicks" Daily Activity     Outcome Measure   Help from another person eating meals?: None Help from another person taking care of personal grooming?: A Little Help from another person toileting, which includes using toliet, bedpan, or urinal?: A Little Help from another person bathing (including washing, rinsing, drying)?: A Little Help from another person to put on and taking off regular upper body clothing?: None Help from another person to put on and taking off regular lower body clothing?: A Lot 6 Click Score: 19    End of Session Equipment Utilized During  Treatment: Rolling walker  OT Visit Diagnosis: Unsteadiness on feet (R26.81);Other abnormalities of gait and mobility (R26.89);Muscle weakness (generalized) (M62.81);Pain Pain - Right/Left: Left Pain - part of body: Knee   Activity Tolerance Patient limited by pain   Patient Left with call bell/phone within reach;in bed;with bed alarm set   Nurse Communication Mobility status;Other (comment);Patient requests pain meds        Time: 1004-1029 OT Time Calculation (min): 25 min  Charges: OT General Charges $OT Visit: 1 Visit OT Treatments $Self Care/Home Management : 23-37 mins  [facility], OTR/L [phone]    Lucille Passy M 09/23/2017, 10:44 AM

## 2017-09-23 NOTE — Progress Notes (Signed)
Patient given verbal and written discharge instructions. All questions answered. PIV removed per discharge protocol. Patient transported, with all personal belongings and DME, to her daughter's waiting car for discharge.

## 2017-09-23 NOTE — Care Management Important Message (Signed)
Important Message  Patient Details  Name: Diana Walters MRN: [id number] Date of Birth: Sep 03, 1955   Medicare Important Message Given:  Yes    Ireanna Finlayson P Jabrea Kallstrom 09/23/2017, 3:03 PM

## 2017-09-23 NOTE — Care Management Note (Addendum)
Case Management Note Previous CM note completed by Maryclare Labrador, RN 09/20/2017, 2:30 PM   Patient Details  Name: KASHISH YGLESIAS MRN: [id number] Date of Birth: Nov 14, 1955  Subjective/Objective:   Pt admitted with CP - s/p AAA repair, now in renal failure  - not yet determined to need HD                 Action/Plan:  PTA Independent from home.  Pts family will provide 24 hour supervision as recommended.  CM provided hardcopy choice list for HH/DME as recommended.  CM will follow back up with pt.   Expected Discharge Date:  09/23/17               Expected Discharge Plan:  [place]  In-House Referral:  NA  Discharge planning Services  CM Consult  Post Acute Care Choice:  Home Health Choice offered to:  Patient  DME Arranged:    DME Agency:     HH Arranged:  RN, PT, OT, Nurse's Aide, Social Work [employer] Agency:  [facility]  Status of Service:  Completed, signed off  If discussed at [employer], dates discussed:    Discharge Disposition: home/home health   Additional Comments:  09/23/17- 0945- Marvetta Gibbons RN, CM- noted [facility] orders and discharge for today. Call made to [facility] with [facility] for [facility] referral has per previous CM note pt has selected [facility] has [facility] agency of choice. Referral accepted. Pt will transition home today.  Update-1020- notified by OT-Wendy- that pt will need 3n1- order placed and call made to [facility] with [facility] for DME need- 3n1 to be delivered to room prior to discharge. Per PT added RW to DME needs  Maryclare Labrador, RN 09/20/2017, 2:30 PM CM confirmed with Renal Doctor Joelyn Oms at this time pt will not go home on Veltassa. Dr Joelyn Oms also informed CM that if needed post discharge pt will be set up with drug by his office.   CM will continue to follow.   Pt chose [facility] for HHPT - CM requested orders so arrangements can be made  09/16/17 Unfortunately pt continues to delay picking a [facility] agency.  CM offered to call daughter  however pt refused.  CM again stressed the importance of getting [place] arranged prior to discharge - pt acknowledged the request.  Weekend CM to follow up with pt CM also requested palliative consult with attending consideration based on pts renal function and current refusal of dialysis.   09/15/2017 CM checked back with pt to get choice for [facility] agency - unfortunately pt has still not decided on [facility] agency.  CM will check back with pt   Dawayne Patricia, RN 09/23/2017, 9:46 AM CM cross coverage for 2C [phone]

## 2017-09-23 NOTE — Telephone Encounter (Signed)
Sched. Appt. LVM 10/28/17 [facility] ue  arterial duplex 10am UE ART 11:30 F/u MD

## 2017-09-26 ENCOUNTER — Other Ambulatory Visit: Payer: Self-pay

## 2017-09-27 NOTE — Discharge Summary (Signed)
Physician Discharge Summary  Diana Walters QQP:[id number] DOB: 02-18-1955 DOA: 09/07/2017  PCP: Renato Shin, MD  Admit date: 09/07/2017 Discharge date: 8/9 /2019  Admitted From: Home.  Disposition: Home.   Recommendations for Outpatient Follow-up:  1. Follow up with PCP in 1-2 weeks 2. Please obtain BMP/CBC in one week Please follow up with CT scan of the chest as recommended by vascular surgery.  Please follow up with nephrology for HD.    Discharge Condition:stable.  CODE STATUS:full code.  Diet recommendation: Heart Healthy   Brief/Interim Summary:  62 year old female who presented with weakness and back pain. She does have significant past medical history for high-grade thoracic aneurysm repair 2014 ([facility]),anemia, asthma, osteoarthritis, rheumatoid arthritis, tobacco abuse, hypertension, dyslipidemia, seizures, CVA, CKDand depression. She was diagnosed with severe anemia, hemoglobin 4.6 and advised to come to the emergency room department.On further questioning reported intermittent pain between her shoulder blades for the last 2 weeks. CT angiography of her chest showed aortic endograft extending from the ascending to the lower descending thoracic aorta. Evidence of acute endoleak at the level of the thoracic arch with active extravasation of contrast material into the native aneurysm sac causing enlargement of the native sac. Extensive hematoma in the mediastinum around the arch and descending aorta. Left pleural effusion. No evidence of aneurysm or dissection in the abdominal aorta.  Patient was admitted to the intensive care unit with a working diagnosis of acute blood loss anemia due to extensive mediastinal hematoma related to aortic aneurysmendoleak.   07/25.Vascular repair of aortic arch and descending thoracic aorta with proximal 34 x 200 mm extended into the descending aorta with 34 x 100 mm extension.  Postoperatively he has developed acute kidney  injury.Nephrology consulted, and plan for permanent access by vascular surgery.  .  She underwent right upper extremity basilic vein fistula, will need a CT scan on September 13rd  As per vascular surgery.   Discharge Diagnoses:  Active Problems:   HYPERCHOLESTEROLEMIA   Aneurysm of thoracic aorta (HCC)   Rheumatoid arthritis (HCC)   CKD (chronic kidney disease) stage 4, GFR 15-29 ml/min (HCC)   Symptomatic anemia  Thoracic aortic endovascular leak with acute blood loss anemia due to mediastinal hematoma: Pt is chest pain free , hemoglobin stable.  Recommend outpatient follow up with a non contrast CT to eval thoracic aneurysm.  Optimal blood pressure.    Acute on Stage 5 CKD with hyperkalemia:  Nephrology on board,and follow up as outpatient.  Resume V[place] for hyperkalemia.  She underwent right upper extremity basilic vein fistula by Vascular surgery     Leukocytosis: Improving.  Possibly reactive    RA;  No new complaints.    Diabetes Mellitus:  CBG (last 3)  RecentLabs(last2labs)       Recent Labs    09/21/17 1627 09/21/17 2152 09/22/17 0805  GLUCAP 111* 166* 87     Resume SSI.   Anemia of acute blood loss superimposed on anemia of chronic disease secondary to stage V renal disease Hemoglobin stable between 10 to 11.  Transfuse to keep hemoglobin greater than 7.    H/o stroke: Stable.     Discharge Instructions  Discharge Instructions    Diet - low sodium heart healthy   Complete by:  As directed    Diet - low sodium heart healthy   Complete by:  As directed    Discharge instructions   Complete by:  As directed    Please follow up vascular surgery as recommended in  the office and you will need a CT of the chest to evaluate further.   Increase activity slowly   Complete by:  As directed      Allergies as of 09/23/2017      Reactions   Ibuprofen Other (See Comments)   Can't take due to medications PT DOESN'T RECALL THIS  ALLERGY   Codeine Other (See Comments)   Hallucinations PT DOESN'T RECALL THIS ALLERGY      Medication List    STOP taking these medications   aspirin EC 81 MG tablet   metoprolol succinate 25 MG 24 hr tablet Commonly known as:  TOPROL-XL     TAKE these medications   acetaminophen 325 MG tablet Commonly known as:  TYLENOL Take 650 mg by mouth every 6 (six) hours as needed for moderate pain.   amLODipine 5 MG tablet Commonly known as:  NORVASC Take 1 tablet (5 mg total) by mouth daily.   atorvastatin 40 MG tablet Commonly known as:  LIPITOR Take 1 tablet (40 mg total) by mouth daily.   famotidine 20 MG tablet Commonly known as:  PEPCID Take 1 tablet (20 mg total) by mouth daily.   feeding supplement (NEPRO CARB STEADY) Liqd Take 237 mLs by mouth at bedtime.   furosemide 80 MG tablet Commonly known as:  LASIX Take 2 tablets (160 mg total) by mouth daily. What changed:    medication strength  how much to take   GAS-X PO Take 2 tablets by mouth daily as needed (flatulence).   hydrALAZINE 50 MG tablet Commonly known as:  APRESOLINE Take 1.5 tablets (75 mg total) by mouth 3 (three) times daily.   ICY HOT ADVANCED RELIEF 16-11 % Crea Generic drug:  Menthol-Camphor Apply 1 application topically daily as needed (knee pain).   isosorbide mononitrate 60 MG 24 hr tablet Commonly known as:  IMDUR Take 1 tablet (60 mg total) by mouth daily.   metoprolol tartrate 25 MG tablet Commonly known as:  LOPRESSOR Take 0.5 tablets (12.5 mg total) by mouth daily.   multivitamin tablet Take 1 tablet by mouth daily.   oxyCODONE-acetaminophen 5-325 MG tablet Commonly known as:  PERCOCET/ROXICET Take 1-2 tablets by mouth every 4 (four) hours as needed for moderate pain.   patiromer 8.4 g packet Commonly known as:  VELTASSA Take 1 packet (8.4 g total) by mouth daily.   sodium bicarbonate 650 MG tablet Take 650 mg by mouth 3 (three) times daily.      Follow-up  Information    Jamal Maes, MD Follow up on 10/05/2017.   Specialty:  Nephrology Why:  At [facility], with PA Contact information: [place] [address] [phone]        Waynetta Sandy, MD. Schedule an appointment as soon as possible for a visit in 2 week(s).   Specialties:  Vascular Surgery, Cardiology Why:  for a CT chest .  Contact information: [place] [address] [phone]        Renato Shin, MD. Schedule an appointment as soon as possible for a visit in 1 week(s).   Specialty:  Endocrinology Contact information: [address] [place] [address] [phone]        Fay Records, MD .   Specialty:  Cardiology Contact information: [address] [place] [address] [place] Follow up.   Specialty:  Home Health Services Why:  HHRN,PT, OT, SW, aide arranged- they will call you to set up home visits Contact information: [place] [address] [place] Follow up.   Why:  3n1 and rolling walker arranged - to be delivered to room prior to discharge Contact information: [address] [place] [address] [phone]          Allergies  Allergen Reactions  . Ibuprofen Other (See Comments)    Can't take due to medications  PT DOESN'T RECALL THIS ALLERGY  . Codeine Other (See Comments)    Hallucinations  PT DOESN'T RECALL THIS ALLERGY    Consultations:  Vascular surgery  Nephrology.    Procedures/Studies: Dg Chest 2 View  Result Date: 09/07/2017 CLINICAL DATA:  Initial evaluation for acute shortness of breath. EXAM: CHEST - 2 VIEW COMPARISON:  Prior radiograph from earlier the same day. FINDINGS: Cardiomegaly again noted, stable. Median sternotomy wires underlying loop recorder. Aortic endograft again seen, stable in position  and appearance. Increased soft tissue density again noted along the arch portion of the graft, similar to previous radiograph from earlier today, but new from prior radiograph from 06/09/2017. Lungs well inflated. Perihilar vascular congestion with interstitial prominence without frank pulmonary edema, somewhat increased from previous. No focal infiltrates. No pneumothorax. No acute osseous abnormality. IMPRESSION: 1. Increased soft tissue density along the superior margin/arch portion of the aortic stent endograft, similar relative to previous radiograph from earlier today, but new relative to 06/09/17. Again, further evaluation with CTA recommended. 2. Stable cardiomegaly with diffuse pulmonary interstitial congestion, increased from radiograph earlier today. Electronically Signed   By: Jeannine Boga M.D.   On: 09/07/2017 22:36   Dg Chest 2 View  Result Date: 09/07/2017 CLINICAL DATA:  Pain between shoulders EXAM: CHEST - 2 VIEW COMPARISON:  06/09/2017 FINDINGS: Cardiac shadow is again enlarged. Loop recorder is noted. Postsurgical changes are seen. Thoracic aortic stent is again identified. Increased soft tissue density is noted along the aortic arch portion of the stent graft new from the prior exam. The patient positioning somewhat different than that seen on the prior exam although this increased soft tissue density is suspicious given the patient's clinical history of back pain. CT is recommended for further evaluation. The lungs are clear bilaterally. Postsurgical changes in the right subclavicular area are noted. No bony abnormality is seen. IMPRESSION: Aortic stent graft is stable in appearance although increased soft tissue density is noted superior to the aortic arch portion of the graft. Given the patient's history of chest pain this is somewhat suspicious for possible expanding aneurysm. CT of the chest (ideally with contrast) is recommended for further evaluation. These results will be  called to the ordering clinician or representative by the Radiologist Assistant, and communication documented in the PACS or zVision Dashboard. Electronically Signed   By: Inez Catalina M.D.   On: 09/07/2017 14:16   Dg Chest Port 1 View  Result Date: 09/08/2017 CLINICAL DATA:  Catheter placement EXAM: PORTABLE CHEST 1 VIEW COMPARISON:  09/08/2017, CT chest 09/08/2017, radiograph 09/07/2017, 06/06/2017, 06/09/2017 FINDINGS: Post sternotomy changes and surgical clips in the right axilla. Aortic stent graft with interval grafting at the ascending aorta and arch. Right-sided central venous Swan-Ganz catheter tip projects over the right pulmonary artery. The right lung is clear. There is scarring at the left base. Stable mild cardiomegaly. Stable enlarged mediastinal silhouette with soft tissue silhouetting the aortic stent graft, corresponding to the  mediastinal hematoma and changes noted on the recent chest CT. There is no pneumothorax. IMPRESSION: 1. Interim insertion of right IJ Swan-Ganz catheter with tip overlying the right pulmonary artery. 2. Interval grafting of the ascending aorta arch and knob since prior radiograph. 3. Stable widened mediastinum with soft tissue silhouetting the aortic arch, corresponding to mediastinal hematoma and changes noted on recent CT. Electronically Signed   By: Donavan Foil M.D.   On: 09/08/2017 19:09   Dg Chest Port 1 View  Result Date: 09/08/2017 CLINICAL DATA:  Hypertension.  Asthma. EXAM: PORTABLE CHEST 1 VIEW COMPARISON:  Chest CT 09/08/2017.  Chest x-ray 09/07/2017. FINDINGS: Prior CABG. Stable cardiomegaly. Thoracic aortic aneurysm with stent graft again noted. This appears stable. Lungs are clear of acute infiltrates. Left-sided pleural thickening versus pleural effusion. No pneumothorax. Surgical clips right chest. IMPRESSION: 1. Prior CABG. Stable cardiomegaly. Thoracic aortic aneurysm with stent graft again noted. Mild left-sided pleural thickening versus pleural  scarring. 2.  No acute pulmonary disease. Electronically Signed   By: Marcello Moores  Register   On: 09/08/2017 12:32   Dg Knee Complete 4 Views Left  Result Date: 09/10/2017 CLINICAL DATA:  Chronic left knee pain.  Severe pain. EXAM: LEFT KNEE - COMPLETE 4+ VIEW COMPARISON:  None. FINDINGS: There is near complete loss of joint space with bone-on-bone configuration in both the medial and lateral compartments of the knee. A moderate-sized joint effusion is present. Subcutaneous edema is also present about the right knee. IMPRESSION: 1. 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 2. Moderate joint effusion suggesting acute inflammation. 3. Superficial edema is also present. Electronically Signed   By: San Morelle M.D.   On: 09/10/2017 16:01   Ct Angio Chest/abd/pel For Dissection W And/or W/wo  Result Date: 09/08/2017 CLINICAL DATA:  History of thoracic aneurysm repair 10 years ago. Now with upper back pain and decreased hemoglobin. EXAM: CT ANGIOGRAPHY CHEST, ABDOMEN AND PELVIS TECHNIQUE: Multidetector CT imaging through the chest, abdomen and pelvis was performed using the standard protocol during bolus administration of intravenous contrast. Multiplanar reconstructed images and MIPs were obtained and reviewed to evaluate the vascular anatomy. CONTRAST:  61mL ISOVUE-370 IOPAMIDOL (ISOVUE-370) INJECTION 76%. Patient has decreased GFR but contrast material was administered due to emergent nature of the patient's condition. Patient was made aware risks and situation was discussed with nephrology. COMPARISON:  CT chest 06/05/2017.  CT a chest 01/26/2016 FINDINGS: CTA CHEST FINDINGS Cardiovascular: Noncontrast images of the chest demonstrate an ascending and descending thoracic aortic endograft with mural calcification around the native aneurysm extending from the ascending to the lower descending aorta. There is some increased density within the hematoma in the native  sac suggesting acute hemorrhage at the level of the aortic arch. Images obtained during arterial phase after contrast administration demonstrate patency of the endograft. Since the previous study, there is enlargement of the native sac at the level of the aortic arch with current measurement of 6.2 cm compared with 4.4 cm previously. There is active extravasation of contrast material into the native sac inferiorly and medial to the aortic arch consistent with acute endoleak. There is increase material in the mediastinum surrounding the aortic arch and descending aorta outside of the native aneurysm sac suggesting hematoma. Great vessel origins are patent. There is no dissection. Diffuse cardiac enlargement. No pericardial effusion. Central pulmonary arteries are well opacified without evidence of significant pulmonary embolus. Mediastinum/Nodes: Diffuse mediastinal hematoma surrounding the arch and descending aorta as discussed.  No significant lymphadenopathy. Esophagus is decompressed. Lungs/Pleura: Small left pleural effusion, possibly hemorrhagic, with atelectasis in the left lung base. Right lung is clear. No pneumothorax. Airways are patent. Musculoskeletal: Degenerative changes in the spine. No destructive bone lesions. Sternotomy wires. Review of the MIP images confirms the above findings. CTA ABDOMEN AND PELVIS FINDINGS VASCULAR Aorta: Normal caliber abdominal aorta with diffuse calcification. No aneurysm or dissection. Celiac: Patent without evidence of aneurysm, dissection, vasculitis or significant stenosis. SMA: Patent without evidence of aneurysm, dissection, vasculitis or significant stenosis. Renals: Duplicated renal arteries bilaterally. Renal arteries are patent without aneurysm or dissection. IMA: Patent without evidence of aneurysm, dissection, vasculitis or significant stenosis. Inflow: Patent without evidence of aneurysm, dissection, vasculitis or significant stenosis. Veins: No obvious venous  abnormality within the limitations of this arterial phase study. Review of the MIP images confirms the above findings. NON-VASCULAR Hepatobiliary: No focal liver abnormality is seen. No gallstones, gallbladder wall thickening, or biliary dilatation. Pancreas: Unremarkable. No pancreatic ductal dilatation or surrounding inflammatory changes. Spleen: Normal in size without focal abnormality. Adrenals/Urinary Tract: No adrenal gland nodules. Cyst in the left kidney is unchanged. Renal nephrograms are symmetrical. No hydronephrosis or hydroureter. Bladder wall is not thickened. Stomach/Bowel: Stomach, small bowel, and colon are not abnormally distended and are predominantly decompressed. No obvious wall thickening or inflammatory changes although decompression limits examination. Appendix is not definitively identified. Lymphatic: No significant lymphadenopathy. Reproductive: Uterus is diffusely enlarged and nodular with multiple calcifications consistent with uterine fibroids. No abnormal adnexal masses. Other: No free air or free fluid in the abdomen. Abdominal wall musculature appears intact. Musculoskeletal: Degenerative changes in the spine. No destructive bone lesions. Review of the MIP images confirms the above findings. IMPRESSION: 1. Aortic endograft extending from the ascending to the lower descending thoracic aorta. There is evidence of acute endoleak at the level of the aortic arch with active extravasation of contrast material into the native aneurysm sac causing enlargement of the native sac. There is also extensive hematoma in the mediastinum around the arch and descending aorta. 2. Small left pleural effusion, possibly hemorrhagic, with atelectasis in the left base. 3. No evidence of aneurysm or dissection of the abdominal aorta. These results were discussed by telephone prior to the time of interpretation on 09/08/2017 at 1:12 am with PA. Langston Masker , who verbally acknowledged these results.  Electronically Signed   By: Lucienne Capers M.D.   On: 09/08/2017 01:15    Subjective: No chest pain or sob.   Discharge Exam: Vitals:   09/23/17 0321 09/23/17 0734  BP: (!) 144/63 (!) 147/55  Pulse: 64 60  Resp: 15 14  Temp: 98.3 F (36.8 C) 98.3 F (36.8 C)  SpO2: 98% 100%   Vitals:   09/22/17 2310 09/23/17 0321 09/23/17 0501 09/23/17 0734  BP: (!) 143/60 (!) 144/63  (!) 147/55  Pulse: 67 64  60  Resp: 12 15  14   Temp: 98.5 F (36.9 C) 98.3 F (36.8 C)  98.3 F (36.8 C)  TempSrc: Oral Oral  Oral  SpO2: 97% 98%  100%  Weight:   68.5 kg   Height:        General: Pt is alert, awake, not in acute distress Cardiovascular: RRR, S1/S2 +, no rubs, no gallops Respiratory: CTA bilaterally, no wheezing, no rhonchi Abdominal: Soft, NT, ND, bowel sounds + Extremities: no edema, no cyanosis    The results of significant diagnostics from this hospitalization (including imaging, microbiology, ancillary and laboratory) are listed below for reference.  Microbiology: No results found for this or any previous visit (from the past 240 hour(s)).   Labs: BNP (last 3 results) Recent Labs    06/05/17 0846  BNP >3,953.2*   Basic Metabolic Panel: Recent Labs  Lab 09/21/17 0229 09/22/17 0223 09/23/17 0848  NA 135 137 136  K 5.1 4.7 4.5  CL 101 103 102  CO2 20* 23 20*  GLUCOSE 94 93 97  BUN 119* 118* 121*  CREATININE 4.61* 4.61* 4.68*  CALCIUM 7.9* 8.0* 8.2*  MG  --  2.0  --    Liver Function Tests: Recent Labs  Lab 09/22/17 0223  AST 14*  ALT 9  ALKPHOS 68  BILITOT 0.7  PROT 5.8*  ALBUMIN 2.3*   No results for input(s): LIPASE, AMYLASE in the last 168 hours. No results for input(s): AMMONIA in the last 168 hours. CBC: Recent Labs  Lab 09/21/17 0229 09/22/17 0223  WBC 14.2* 12.9*  NEUTROABS 10.7*  --   HGB 11.3* 10.4*  HCT 36.4 33.4*  MCV 90.3 88.6  PLT 198 171   Cardiac Enzymes: No results for input(s): CKTOTAL, CKMB, CKMBINDEX, TROPONINI in  the last 168 hours. BNP: Invalid input(s): POCBNP CBG: Recent Labs  Lab 09/22/17 0805 09/22/17 1148 09/22/17 1643 09/22/17 2125 09/23/17 0752  GLUCAP 87 89 87 103* 101*   D-Dimer No results for input(s): DDIMER in the last 72 hours. Hgb A1c No results for input(s): HGBA1C in the last 72 hours. Lipid Profile No results for input(s): CHOL, HDL, LDLCALC, TRIG, CHOLHDL, LDLDIRECT in the last 72 hours. Thyroid function studies No results for input(s): TSH, T4TOTAL, T3FREE, THYROIDAB in the last 72 hours.  Invalid input(s): FREET3 Anemia work up No results for input(s): VITAMINB12, FOLATE, FERRITIN, TIBC, IRON, RETICCTPCT in the last 72 hours. Urinalysis    Component Value Date/Time   COLORURINE YELLOW 09/07/2017 2340   APPEARANCEUR CLEAR 09/07/2017 2340   LABSPEC 1.014 09/07/2017 2340   PHURINE 6.0 09/07/2017 2340   GLUCOSEU NEGATIVE 09/07/2017 2340   GLUCOSEU NEGATIVE 07/06/2013 1105   HGBUR NEGATIVE 09/07/2017 2340   BILIRUBINUR NEGATIVE 09/07/2017 2340   KETONESUR NEGATIVE 09/07/2017 2340   PROTEINUR 100 (A) 09/07/2017 2340   UROBILINOGEN 0.2 07/06/2013 1105   NITRITE NEGATIVE 09/07/2017 2340   LEUKOCYTESUR NEGATIVE 09/07/2017 2340   Sepsis Labs Invalid input(s): PROCALCITONIN,  WBC,  LACTICIDVEN Microbiology No results found for this or any previous visit (from the past 240 hour(s)).   Time coordinating discharge: 32  minutes  SIGNED:   Hosie Poisson, MD  [facility] 09/27/2017, 8:46 AM Pager   If 7PM-7AM, please contact night-coverage [email or web address] Password [email or web address]

## 2017-09-29 DIAGNOSIS — J45909 Unspecified asthma, uncomplicated: Secondary | ICD-10-CM | POA: Diagnosis not present

## 2017-09-29 DIAGNOSIS — F17210 Nicotine dependence, cigarettes, uncomplicated: Secondary | ICD-10-CM | POA: Diagnosis not present

## 2017-09-29 DIAGNOSIS — M069 Rheumatoid arthritis, unspecified: Secondary | ICD-10-CM | POA: Diagnosis not present

## 2017-09-29 DIAGNOSIS — F419 Anxiety disorder, unspecified: Secondary | ICD-10-CM | POA: Diagnosis not present

## 2017-09-29 DIAGNOSIS — N184 Chronic kidney disease, stage 4 (severe): Secondary | ICD-10-CM | POA: Diagnosis not present

## 2017-09-29 DIAGNOSIS — I130 Hypertensive heart and chronic kidney disease with heart failure and stage 1 through stage 4 chronic kidney disease, or unspecified chronic kidney disease: Secondary | ICD-10-CM | POA: Diagnosis not present

## 2017-09-29 DIAGNOSIS — Z48812 Encounter for surgical aftercare following surgery on the circulatory system: Secondary | ICD-10-CM | POA: Diagnosis not present

## 2017-09-29 DIAGNOSIS — G40909 Epilepsy, unspecified, not intractable, without status epilepticus: Secondary | ICD-10-CM | POA: Diagnosis not present

## 2017-09-29 DIAGNOSIS — I509 Heart failure, unspecified: Secondary | ICD-10-CM | POA: Diagnosis not present

## 2017-09-29 DIAGNOSIS — M810 Age-related osteoporosis without current pathological fracture: Secondary | ICD-10-CM | POA: Diagnosis not present

## 2017-09-29 DIAGNOSIS — F329 Major depressive disorder, single episode, unspecified: Secondary | ICD-10-CM | POA: Diagnosis not present

## 2017-09-29 DIAGNOSIS — E785 Hyperlipidemia, unspecified: Secondary | ICD-10-CM | POA: Diagnosis not present

## 2017-09-30 ENCOUNTER — Telehealth: Payer: Self-pay | Admitting: Internal Medicine

## 2017-09-30 NOTE — Telephone Encounter (Signed)
Left a detailed message on Diana Walters with [facility] confirmed VM that Dr Harrington Challenger and Caren Hazy RN are out of the office today, but will return on Monday.  [name] to call back in to follow-up on this request. Informed [name] that I will route this message to both Dr Harrington Challenger and Caren Hazy for further review and follow-up, upon return to the office.  Advised Diana Walters to call back with any further questions regarding message left.

## 2017-09-30 NOTE — Telephone Encounter (Signed)
New Message   [facility] with [place] is calling for orders for nurse visits for 9 weeks. Please call to discuss.

## 2017-10-03 ENCOUNTER — Other Ambulatory Visit: Payer: Self-pay

## 2017-10-03 DIAGNOSIS — I130 Hypertensive heart and chronic kidney disease with heart failure and stage 1 through stage 4 chronic kidney disease, or unspecified chronic kidney disease: Secondary | ICD-10-CM | POA: Diagnosis not present

## 2017-10-03 DIAGNOSIS — G40909 Epilepsy, unspecified, not intractable, without status epilepticus: Secondary | ICD-10-CM | POA: Diagnosis not present

## 2017-10-03 DIAGNOSIS — I509 Heart failure, unspecified: Secondary | ICD-10-CM | POA: Diagnosis not present

## 2017-10-03 DIAGNOSIS — Z48812 Encounter for surgical aftercare following surgery on the circulatory system: Secondary | ICD-10-CM | POA: Diagnosis not present

## 2017-10-03 DIAGNOSIS — N184 Chronic kidney disease, stage 4 (severe): Secondary | ICD-10-CM

## 2017-10-03 DIAGNOSIS — M069 Rheumatoid arthritis, unspecified: Secondary | ICD-10-CM | POA: Diagnosis not present

## 2017-10-05 DIAGNOSIS — Z48812 Encounter for surgical aftercare following surgery on the circulatory system: Secondary | ICD-10-CM | POA: Diagnosis not present

## 2017-10-05 DIAGNOSIS — M069 Rheumatoid arthritis, unspecified: Secondary | ICD-10-CM | POA: Diagnosis not present

## 2017-10-05 DIAGNOSIS — G40909 Epilepsy, unspecified, not intractable, without status epilepticus: Secondary | ICD-10-CM | POA: Diagnosis not present

## 2017-10-05 DIAGNOSIS — I130 Hypertensive heart and chronic kidney disease with heart failure and stage 1 through stage 4 chronic kidney disease, or unspecified chronic kidney disease: Secondary | ICD-10-CM | POA: Diagnosis not present

## 2017-10-05 DIAGNOSIS — I509 Heart failure, unspecified: Secondary | ICD-10-CM | POA: Diagnosis not present

## 2017-10-05 DIAGNOSIS — N184 Chronic kidney disease, stage 4 (severe): Secondary | ICD-10-CM | POA: Diagnosis not present

## 2017-10-06 DIAGNOSIS — G40909 Epilepsy, unspecified, not intractable, without status epilepticus: Secondary | ICD-10-CM | POA: Diagnosis not present

## 2017-10-06 DIAGNOSIS — M069 Rheumatoid arthritis, unspecified: Secondary | ICD-10-CM | POA: Diagnosis not present

## 2017-10-06 DIAGNOSIS — N184 Chronic kidney disease, stage 4 (severe): Secondary | ICD-10-CM | POA: Diagnosis not present

## 2017-10-06 DIAGNOSIS — I509 Heart failure, unspecified: Secondary | ICD-10-CM | POA: Diagnosis not present

## 2017-10-06 DIAGNOSIS — Z48812 Encounter for surgical aftercare following surgery on the circulatory system: Secondary | ICD-10-CM | POA: Diagnosis not present

## 2017-10-06 DIAGNOSIS — I130 Hypertensive heart and chronic kidney disease with heart failure and stage 1 through stage 4 chronic kidney disease, or unspecified chronic kidney disease: Secondary | ICD-10-CM | POA: Diagnosis not present

## 2017-10-06 NOTE — Telephone Encounter (Signed)
Please make sure orders in for [facility]

## 2017-10-06 NOTE — Telephone Encounter (Signed)
Left detailed message on Cory's voice mail that Dr. Harrington Challenger will sign off for home care nursing visits and that she can fax the orders to Dr. Harrington Challenger at [phone] to be signed. Asked her to call back with any further questions/concerns.

## 2017-10-12 DIAGNOSIS — G40909 Epilepsy, unspecified, not intractable, without status epilepticus: Secondary | ICD-10-CM | POA: Diagnosis not present

## 2017-10-12 DIAGNOSIS — I130 Hypertensive heart and chronic kidney disease with heart failure and stage 1 through stage 4 chronic kidney disease, or unspecified chronic kidney disease: Secondary | ICD-10-CM | POA: Diagnosis not present

## 2017-10-12 DIAGNOSIS — Z48812 Encounter for surgical aftercare following surgery on the circulatory system: Secondary | ICD-10-CM | POA: Diagnosis not present

## 2017-10-12 DIAGNOSIS — I509 Heart failure, unspecified: Secondary | ICD-10-CM | POA: Diagnosis not present

## 2017-10-12 DIAGNOSIS — M069 Rheumatoid arthritis, unspecified: Secondary | ICD-10-CM | POA: Diagnosis not present

## 2017-10-12 DIAGNOSIS — N184 Chronic kidney disease, stage 4 (severe): Secondary | ICD-10-CM | POA: Diagnosis not present

## 2017-10-14 DIAGNOSIS — M069 Rheumatoid arthritis, unspecified: Secondary | ICD-10-CM | POA: Diagnosis not present

## 2017-10-14 DIAGNOSIS — Z48812 Encounter for surgical aftercare following surgery on the circulatory system: Secondary | ICD-10-CM | POA: Diagnosis not present

## 2017-10-14 DIAGNOSIS — G40909 Epilepsy, unspecified, not intractable, without status epilepticus: Secondary | ICD-10-CM | POA: Diagnosis not present

## 2017-10-14 DIAGNOSIS — I509 Heart failure, unspecified: Secondary | ICD-10-CM | POA: Diagnosis not present

## 2017-10-14 DIAGNOSIS — N184 Chronic kidney disease, stage 4 (severe): Secondary | ICD-10-CM | POA: Diagnosis not present

## 2017-10-14 DIAGNOSIS — I130 Hypertensive heart and chronic kidney disease with heart failure and stage 1 through stage 4 chronic kidney disease, or unspecified chronic kidney disease: Secondary | ICD-10-CM | POA: Diagnosis not present

## 2017-10-17 DIAGNOSIS — G40909 Epilepsy, unspecified, not intractable, without status epilepticus: Secondary | ICD-10-CM | POA: Diagnosis not present

## 2017-10-17 DIAGNOSIS — N184 Chronic kidney disease, stage 4 (severe): Secondary | ICD-10-CM | POA: Diagnosis not present

## 2017-10-17 DIAGNOSIS — M069 Rheumatoid arthritis, unspecified: Secondary | ICD-10-CM | POA: Diagnosis not present

## 2017-10-17 DIAGNOSIS — I509 Heart failure, unspecified: Secondary | ICD-10-CM | POA: Diagnosis not present

## 2017-10-17 DIAGNOSIS — I130 Hypertensive heart and chronic kidney disease with heart failure and stage 1 through stage 4 chronic kidney disease, or unspecified chronic kidney disease: Secondary | ICD-10-CM | POA: Diagnosis not present

## 2017-10-17 DIAGNOSIS — Z48812 Encounter for surgical aftercare following surgery on the circulatory system: Secondary | ICD-10-CM | POA: Diagnosis not present

## 2017-10-20 DIAGNOSIS — G40909 Epilepsy, unspecified, not intractable, without status epilepticus: Secondary | ICD-10-CM | POA: Diagnosis not present

## 2017-10-20 DIAGNOSIS — Z48812 Encounter for surgical aftercare following surgery on the circulatory system: Secondary | ICD-10-CM | POA: Diagnosis not present

## 2017-10-20 DIAGNOSIS — N184 Chronic kidney disease, stage 4 (severe): Secondary | ICD-10-CM | POA: Diagnosis not present

## 2017-10-20 DIAGNOSIS — M069 Rheumatoid arthritis, unspecified: Secondary | ICD-10-CM | POA: Diagnosis not present

## 2017-10-20 DIAGNOSIS — I509 Heart failure, unspecified: Secondary | ICD-10-CM | POA: Diagnosis not present

## 2017-10-20 DIAGNOSIS — I130 Hypertensive heart and chronic kidney disease with heart failure and stage 1 through stage 4 chronic kidney disease, or unspecified chronic kidney disease: Secondary | ICD-10-CM | POA: Diagnosis not present

## 2017-10-24 DIAGNOSIS — Z48812 Encounter for surgical aftercare following surgery on the circulatory system: Secondary | ICD-10-CM | POA: Diagnosis not present

## 2017-10-24 DIAGNOSIS — I130 Hypertensive heart and chronic kidney disease with heart failure and stage 1 through stage 4 chronic kidney disease, or unspecified chronic kidney disease: Secondary | ICD-10-CM | POA: Diagnosis not present

## 2017-10-24 DIAGNOSIS — M069 Rheumatoid arthritis, unspecified: Secondary | ICD-10-CM | POA: Diagnosis not present

## 2017-10-24 DIAGNOSIS — N184 Chronic kidney disease, stage 4 (severe): Secondary | ICD-10-CM | POA: Diagnosis not present

## 2017-10-24 DIAGNOSIS — G40909 Epilepsy, unspecified, not intractable, without status epilepticus: Secondary | ICD-10-CM | POA: Diagnosis not present

## 2017-10-24 DIAGNOSIS — I509 Heart failure, unspecified: Secondary | ICD-10-CM | POA: Diagnosis not present

## 2017-10-26 ENCOUNTER — Other Ambulatory Visit: Payer: Medicare Other

## 2017-10-28 ENCOUNTER — Ambulatory Visit (HOSPITAL_COMMUNITY): Admission: RE | Admit: 2017-10-28 | Payer: Medicare Other | Source: Ambulatory Visit

## 2017-10-28 ENCOUNTER — Ambulatory Visit: Payer: Medicare Other | Admitting: Vascular Surgery

## 2017-10-28 ENCOUNTER — Encounter: Payer: Self-pay | Admitting: Vascular Surgery

## 2017-10-29 DIAGNOSIS — Z48812 Encounter for surgical aftercare following surgery on the circulatory system: Secondary | ICD-10-CM | POA: Diagnosis not present

## 2017-10-29 DIAGNOSIS — M069 Rheumatoid arthritis, unspecified: Secondary | ICD-10-CM | POA: Diagnosis not present

## 2017-10-29 DIAGNOSIS — I130 Hypertensive heart and chronic kidney disease with heart failure and stage 1 through stage 4 chronic kidney disease, or unspecified chronic kidney disease: Secondary | ICD-10-CM | POA: Diagnosis not present

## 2017-10-29 DIAGNOSIS — G40909 Epilepsy, unspecified, not intractable, without status epilepticus: Secondary | ICD-10-CM | POA: Diagnosis not present

## 2017-10-29 DIAGNOSIS — I509 Heart failure, unspecified: Secondary | ICD-10-CM | POA: Diagnosis not present

## 2017-10-29 DIAGNOSIS — N184 Chronic kidney disease, stage 4 (severe): Secondary | ICD-10-CM | POA: Diagnosis not present

## 2017-10-30 ENCOUNTER — Emergency Department (HOSPITAL_COMMUNITY): Payer: Medicare Other

## 2017-10-30 ENCOUNTER — Inpatient Hospital Stay (HOSPITAL_COMMUNITY)
Admission: EM | Admit: 2017-10-30 | Discharge: 2017-11-15 | DRG: 870 | Disposition: E | Payer: Medicare Other | Attending: Pulmonary Disease | Admitting: Pulmonary Disease

## 2017-10-30 ENCOUNTER — Inpatient Hospital Stay (HOSPITAL_COMMUNITY): Payer: Medicare Other

## 2017-10-30 ENCOUNTER — Encounter (HOSPITAL_COMMUNITY): Payer: Self-pay | Admitting: Student

## 2017-10-30 DIAGNOSIS — I480 Paroxysmal atrial fibrillation: Secondary | ICD-10-CM | POA: Diagnosis present

## 2017-10-30 DIAGNOSIS — E440 Moderate protein-calorie malnutrition: Secondary | ICD-10-CM | POA: Diagnosis present

## 2017-10-30 DIAGNOSIS — F17210 Nicotine dependence, cigarettes, uncomplicated: Secondary | ICD-10-CM | POA: Diagnosis present

## 2017-10-30 DIAGNOSIS — Z9911 Dependence on respirator [ventilator] status: Secondary | ICD-10-CM

## 2017-10-30 DIAGNOSIS — L89152 Pressure ulcer of sacral region, stage 2: Secondary | ICD-10-CM | POA: Diagnosis present

## 2017-10-30 DIAGNOSIS — I272 Pulmonary hypertension, unspecified: Secondary | ICD-10-CM | POA: Diagnosis present

## 2017-10-30 DIAGNOSIS — Y95 Nosocomial condition: Secondary | ICD-10-CM | POA: Diagnosis present

## 2017-10-30 DIAGNOSIS — Z4682 Encounter for fitting and adjustment of non-vascular catheter: Secondary | ICD-10-CM | POA: Diagnosis not present

## 2017-10-30 DIAGNOSIS — N184 Chronic kidney disease, stage 4 (severe): Secondary | ICD-10-CM | POA: Diagnosis present

## 2017-10-30 DIAGNOSIS — R011 Cardiac murmur, unspecified: Secondary | ICD-10-CM | POA: Diagnosis present

## 2017-10-30 DIAGNOSIS — D631 Anemia in chronic kidney disease: Secondary | ICD-10-CM | POA: Diagnosis present

## 2017-10-30 DIAGNOSIS — I10 Essential (primary) hypertension: Secondary | ICD-10-CM | POA: Diagnosis not present

## 2017-10-30 DIAGNOSIS — E7800 Pure hypercholesterolemia, unspecified: Secondary | ICD-10-CM | POA: Diagnosis present

## 2017-10-30 DIAGNOSIS — F329 Major depressive disorder, single episode, unspecified: Secondary | ICD-10-CM | POA: Diagnosis present

## 2017-10-30 DIAGNOSIS — I4581 Long QT syndrome: Secondary | ICD-10-CM | POA: Diagnosis not present

## 2017-10-30 DIAGNOSIS — R197 Diarrhea, unspecified: Secondary | ICD-10-CM | POA: Diagnosis not present

## 2017-10-30 DIAGNOSIS — E874 Mixed disorder of acid-base balance: Secondary | ICD-10-CM | POA: Diagnosis not present

## 2017-10-30 DIAGNOSIS — I499 Cardiac arrhythmia, unspecified: Secondary | ICD-10-CM | POA: Diagnosis not present

## 2017-10-30 DIAGNOSIS — Q211 Atrial septal defect: Secondary | ICD-10-CM

## 2017-10-30 DIAGNOSIS — I248 Other forms of acute ischemic heart disease: Secondary | ICD-10-CM | POA: Diagnosis not present

## 2017-10-30 DIAGNOSIS — J9602 Acute respiratory failure with hypercapnia: Secondary | ICD-10-CM | POA: Diagnosis present

## 2017-10-30 DIAGNOSIS — R34 Anuria and oliguria: Secondary | ICD-10-CM | POA: Diagnosis present

## 2017-10-30 DIAGNOSIS — I712 Thoracic aortic aneurysm, without rupture, unspecified: Secondary | ICD-10-CM | POA: Diagnosis present

## 2017-10-30 DIAGNOSIS — Z720 Tobacco use: Secondary | ICD-10-CM | POA: Diagnosis not present

## 2017-10-30 DIAGNOSIS — Z515 Encounter for palliative care: Secondary | ICD-10-CM | POA: Diagnosis not present

## 2017-10-30 DIAGNOSIS — R64 Cachexia: Secondary | ICD-10-CM | POA: Diagnosis present

## 2017-10-30 DIAGNOSIS — Z885 Allergy status to narcotic agent status: Secondary | ICD-10-CM

## 2017-10-30 DIAGNOSIS — Z886 Allergy status to analgesic agent status: Secondary | ICD-10-CM

## 2017-10-30 DIAGNOSIS — Z8673 Personal history of transient ischemic attack (TIA), and cerebral infarction without residual deficits: Secondary | ICD-10-CM

## 2017-10-30 DIAGNOSIS — N179 Acute kidney failure, unspecified: Secondary | ICD-10-CM | POA: Diagnosis present

## 2017-10-30 DIAGNOSIS — F1290 Cannabis use, unspecified, uncomplicated: Secondary | ICD-10-CM | POA: Diagnosis present

## 2017-10-30 DIAGNOSIS — Z8249 Family history of ischemic heart disease and other diseases of the circulatory system: Secondary | ICD-10-CM

## 2017-10-30 DIAGNOSIS — I5043 Acute on chronic combined systolic (congestive) and diastolic (congestive) heart failure: Secondary | ICD-10-CM | POA: Diagnosis present

## 2017-10-30 DIAGNOSIS — Z452 Encounter for adjustment and management of vascular access device: Secondary | ICD-10-CM

## 2017-10-30 DIAGNOSIS — M7989 Other specified soft tissue disorders: Secondary | ICD-10-CM | POA: Diagnosis not present

## 2017-10-30 DIAGNOSIS — R000 Tachycardia, unspecified: Secondary | ICD-10-CM | POA: Diagnosis not present

## 2017-10-30 DIAGNOSIS — N185 Chronic kidney disease, stage 5: Secondary | ICD-10-CM | POA: Diagnosis not present

## 2017-10-30 DIAGNOSIS — F419 Anxiety disorder, unspecified: Secondary | ICD-10-CM | POA: Diagnosis present

## 2017-10-30 DIAGNOSIS — A419 Sepsis, unspecified organism: Principal | ICD-10-CM | POA: Diagnosis present

## 2017-10-30 DIAGNOSIS — E785 Hyperlipidemia, unspecified: Secondary | ICD-10-CM | POA: Diagnosis present

## 2017-10-30 DIAGNOSIS — I214 Non-ST elevation (NSTEMI) myocardial infarction: Secondary | ICD-10-CM | POA: Diagnosis not present

## 2017-10-30 DIAGNOSIS — R069 Unspecified abnormalities of breathing: Secondary | ICD-10-CM | POA: Diagnosis not present

## 2017-10-30 DIAGNOSIS — J9690 Respiratory failure, unspecified, unspecified whether with hypoxia or hypercapnia: Secondary | ICD-10-CM

## 2017-10-30 DIAGNOSIS — R739 Hyperglycemia, unspecified: Secondary | ICD-10-CM | POA: Diagnosis present

## 2017-10-30 DIAGNOSIS — G40909 Epilepsy, unspecified, not intractable, without status epilepticus: Secondary | ICD-10-CM | POA: Diagnosis present

## 2017-10-30 DIAGNOSIS — R0602 Shortness of breath: Secondary | ICD-10-CM | POA: Diagnosis not present

## 2017-10-30 DIAGNOSIS — M069 Rheumatoid arthritis, unspecified: Secondary | ICD-10-CM | POA: Diagnosis present

## 2017-10-30 DIAGNOSIS — I5022 Chronic systolic (congestive) heart failure: Secondary | ICD-10-CM | POA: Diagnosis not present

## 2017-10-30 DIAGNOSIS — R0902 Hypoxemia: Secondary | ICD-10-CM | POA: Diagnosis not present

## 2017-10-30 DIAGNOSIS — N186 End stage renal disease: Secondary | ICD-10-CM | POA: Diagnosis not present

## 2017-10-30 DIAGNOSIS — R064 Hyperventilation: Secondary | ICD-10-CM | POA: Diagnosis not present

## 2017-10-30 DIAGNOSIS — J810 Acute pulmonary edema: Secondary | ICD-10-CM | POA: Diagnosis not present

## 2017-10-30 DIAGNOSIS — R451 Restlessness and agitation: Secondary | ICD-10-CM | POA: Diagnosis present

## 2017-10-30 DIAGNOSIS — K5900 Constipation, unspecified: Secondary | ICD-10-CM | POA: Diagnosis present

## 2017-10-30 DIAGNOSIS — E875 Hyperkalemia: Secondary | ICD-10-CM | POA: Diagnosis present

## 2017-10-30 DIAGNOSIS — R6521 Severe sepsis with septic shock: Secondary | ICD-10-CM | POA: Diagnosis present

## 2017-10-30 DIAGNOSIS — J9601 Acute respiratory failure with hypoxia: Secondary | ICD-10-CM | POA: Diagnosis present

## 2017-10-30 DIAGNOSIS — J189 Pneumonia, unspecified organism: Secondary | ICD-10-CM | POA: Diagnosis present

## 2017-10-30 DIAGNOSIS — L8990 Pressure ulcer of unspecified site, unspecified stage: Secondary | ICD-10-CM

## 2017-10-30 DIAGNOSIS — I361 Nonrheumatic tricuspid (valve) insufficiency: Secondary | ICD-10-CM | POA: Diagnosis present

## 2017-10-30 DIAGNOSIS — Z95828 Presence of other vascular implants and grafts: Secondary | ICD-10-CM

## 2017-10-30 DIAGNOSIS — Z6820 Body mass index (BMI) 20.0-20.9, adult: Secondary | ICD-10-CM

## 2017-10-30 DIAGNOSIS — M810 Age-related osteoporosis without current pathological fracture: Secondary | ICD-10-CM | POA: Diagnosis present

## 2017-10-30 DIAGNOSIS — E872 Acidosis: Secondary | ICD-10-CM

## 2017-10-30 DIAGNOSIS — J181 Lobar pneumonia, unspecified organism: Secondary | ICD-10-CM | POA: Diagnosis not present

## 2017-10-30 DIAGNOSIS — Z79899 Other long term (current) drug therapy: Secondary | ICD-10-CM

## 2017-10-30 DIAGNOSIS — I132 Hypertensive heart and chronic kidney disease with heart failure and with stage 5 chronic kidney disease, or end stage renal disease: Secondary | ICD-10-CM | POA: Diagnosis not present

## 2017-10-30 DIAGNOSIS — J440 Chronic obstructive pulmonary disease with acute lower respiratory infection: Secondary | ICD-10-CM | POA: Diagnosis present

## 2017-10-30 DIAGNOSIS — R0603 Acute respiratory distress: Secondary | ICD-10-CM | POA: Diagnosis not present

## 2017-10-30 DIAGNOSIS — R001 Bradycardia, unspecified: Secondary | ICD-10-CM | POA: Diagnosis not present

## 2017-10-30 DIAGNOSIS — R570 Cardiogenic shock: Secondary | ICD-10-CM | POA: Diagnosis present

## 2017-10-30 DIAGNOSIS — J80 Acute respiratory distress syndrome: Secondary | ICD-10-CM

## 2017-10-30 DIAGNOSIS — R9431 Abnormal electrocardiogram [ECG] [EKG]: Secondary | ICD-10-CM | POA: Diagnosis not present

## 2017-10-30 DIAGNOSIS — R748 Abnormal levels of other serum enzymes: Secondary | ICD-10-CM | POA: Diagnosis not present

## 2017-10-30 DIAGNOSIS — I959 Hypotension, unspecified: Secondary | ICD-10-CM | POA: Diagnosis not present

## 2017-10-30 LAB — BASIC METABOLIC PANEL
ANION GAP: 15 (ref 5–15)
ANION GAP: 12 (ref 5–15)
Anion gap: 16 — ABNORMAL HIGH (ref 5–15)
Anion gap: 12 (ref 5–15)
BUN: 47 mg/dL — ABNORMAL HIGH (ref 8–23)
BUN: 50 mg/dL — ABNORMAL HIGH (ref 8–23)
BUN: 52 mg/dL — ABNORMAL HIGH (ref 8–23)
BUN: 54 mg/dL — ABNORMAL HIGH (ref 8–23)
CALCIUM: 8.7 mg/dL — AB (ref 8.9–10.3)
CALCIUM: 8.1 mg/dL — AB (ref 8.9–10.3)
CHLORIDE: 113 mmol/L — AB (ref 98–111)
CHLORIDE: 115 mmol/L — AB (ref 98–111)
CO2: 11 mmol/L — ABNORMAL LOW (ref 22–32)
CO2: 12 mmol/L — ABNORMAL LOW (ref 22–32)
CO2: 12 mmol/L — ABNORMAL LOW (ref 22–32)
CO2: 14 mmol/L — ABNORMAL LOW (ref 22–32)
CREATININE: 4.05 mg/dL — AB (ref 0.44–1.00)
Calcium: 8.6 mg/dL — ABNORMAL LOW (ref 8.9–10.3)
Calcium: 8.3 mg/dL — ABNORMAL LOW (ref 8.9–10.3)
Chloride: 114 mmol/L — ABNORMAL HIGH (ref 98–111)
Chloride: 117 mmol/L — ABNORMAL HIGH (ref 98–111)
Creatinine, Ser: 3.73 mg/dL — ABNORMAL HIGH (ref 0.44–1.00)
Creatinine, Ser: 3.93 mg/dL — ABNORMAL HIGH (ref 0.44–1.00)
Creatinine, Ser: 3.98 mg/dL — ABNORMAL HIGH (ref 0.44–1.00)
GFR calc Af Amer: 14 mL/min — ABNORMAL LOW (ref >60)
GFR calc Af Amer: 13 mL/min — ABNORMAL LOW (ref >60)
GFR calc Af Amer: 13 mL/min — ABNORMAL LOW (ref >60)
GFR calc non Af Amer: 11 mL/min — ABNORMAL LOW (ref >60)
GFR calc non Af Amer: 11 mL/min — ABNORMAL LOW (ref >60)
GFR, EST AFRICAN AMERICAN: 13 mL/min — AB (ref >60)
GFR, EST NON AFRICAN AMERICAN: 12 mL/min — AB (ref >60)
GFR, EST NON AFRICAN AMERICAN: 11 mL/min — AB (ref >60)
GLUCOSE: 142 mg/dL — AB (ref 70–99)
GLUCOSE: 143 mg/dL — AB (ref 70–99)
Glucose, Bld: 144 mg/dL — ABNORMAL HIGH (ref 70–99)
Glucose, Bld: 154 mg/dL — ABNORMAL HIGH (ref 70–99)
POTASSIUM: 4.8 mmol/L (ref 3.5–5.1)
POTASSIUM: 4.4 mmol/L (ref 3.5–5.1)
Potassium: 4.7 mmol/L (ref 3.5–5.1)
Potassium: 5.1 mmol/L (ref 3.5–5.1)
SODIUM: 141 mmol/L (ref 135–145)
SODIUM: 141 mmol/L (ref 135–145)
Sodium: 140 mmol/L (ref 135–145)
Sodium: 141 mmol/L (ref 135–145)

## 2017-10-30 LAB — URINALYSIS, ROUTINE W REFLEX MICROSCOPIC
Bilirubin Urine: NEGATIVE
Glucose, UA: NEGATIVE mg/dL
Ketones, ur: NEGATIVE mg/dL
NITRITE: NEGATIVE
Protein, ur: 100 mg/dL — AB
SPECIFIC GRAVITY, URINE: 1.009 (ref 1.005–1.030)
pH: 5 (ref 5.0–8.0)

## 2017-10-30 LAB — TRIGLYCERIDES: TRIGLYCERIDES: 119 mg/dL (ref <150)

## 2017-10-30 LAB — POCT I-STAT 3, ART BLOOD GAS (G3+)
Acid-base deficit: 14 mmol/L — ABNORMAL HIGH (ref 0.0–2.0)
BICARBONATE: 14.9 mmol/L — AB (ref 20.0–28.0)
O2 Saturation: 87 %
PCO2 ART: 46.6 mmHg (ref 32.0–48.0)
TCO2: 16 mmol/L — ABNORMAL LOW (ref 22–32)
pH, Arterial: 7.111 — CL (ref 7.350–7.450)
pO2, Arterial: 69 mmHg — ABNORMAL LOW (ref 83.0–108.0)

## 2017-10-30 LAB — CBC
HEMATOCRIT: 29.2 % — AB (ref 36.0–46.0)
HEMOGLOBIN: 8.7 g/dL — AB (ref 12.0–15.0)
MCH: 25.9 pg — AB (ref 26.0–34.0)
MCHC: 29.8 g/dL — AB (ref 30.0–36.0)
MCV: 86.9 fL (ref 78.0–100.0)
Platelets: 405 10*3/uL — ABNORMAL HIGH (ref 150–400)
RBC: 3.36 MIL/uL — ABNORMAL LOW (ref 3.87–5.11)
RDW: 22.9 % — ABNORMAL HIGH (ref 11.5–15.5)
WBC: 21.6 10*3/uL — ABNORMAL HIGH (ref 4.0–10.5)

## 2017-10-30 LAB — LACTIC ACID, PLASMA: LACTIC ACID, VENOUS: 2.1 mmol/L — AB (ref 0.5–1.9)

## 2017-10-30 LAB — BRAIN NATRIURETIC PEPTIDE

## 2017-10-30 LAB — CBC WITH DIFFERENTIAL/PLATELET
BASOS ABS: 0 10*3/uL (ref 0.0–0.1)
Basophils Relative: 0 %
EOS PCT: 0 %
Eosinophils Absolute: 0 10*3/uL (ref 0.0–0.7)
HEMATOCRIT: 31.3 % — AB (ref 36.0–46.0)
Hemoglobin: 9.5 g/dL — ABNORMAL LOW (ref 12.0–15.0)
LYMPHS ABS: 1.6 10*3/uL (ref 0.7–4.0)
Lymphocytes Relative: 9 %
MCH: 26.2 pg (ref 26.0–34.0)
MCHC: 30.4 g/dL (ref 30.0–36.0)
MCV: 86.5 fL (ref 78.0–100.0)
MONOS PCT: 4 %
Monocytes Absolute: 0.7 10*3/uL (ref 0.1–1.0)
NEUTROS ABS: 15.2 10*3/uL — AB (ref 1.7–7.7)
Neutrophils Relative %: 87 %
Platelets: 376 10*3/uL (ref 150–400)
RBC: 3.62 MIL/uL — ABNORMAL LOW (ref 3.87–5.11)
RDW: 23.3 % — AB (ref 11.5–15.5)
WBC: 17.5 10*3/uL — ABNORMAL HIGH (ref 4.0–10.5)

## 2017-10-30 LAB — RAPID URINE DRUG SCREEN, HOSP PERFORMED
Amphetamines: NOT DETECTED
BARBITURATES: NOT DETECTED
BENZODIAZEPINES: POSITIVE — AB
COCAINE: NOT DETECTED
Opiates: NOT DETECTED
TETRAHYDROCANNABINOL: POSITIVE — AB

## 2017-10-30 LAB — COOXEMETRY PANEL
Carboxyhemoglobin: 1.2 % (ref 0.5–1.5)
Methemoglobin: 1.7 % — ABNORMAL HIGH (ref 0.0–1.5)
O2 SAT: 70.8 %
Total hemoglobin: 10.5 g/dL — ABNORMAL LOW (ref 12.0–16.0)

## 2017-10-30 LAB — I-STAT ARTERIAL BLOOD GAS, ED
Acid-base deficit: 14 mmol/L — ABNORMAL HIGH (ref 0.0–2.0)
Acid-base deficit: 13 mmol/L — ABNORMAL HIGH (ref 0.0–2.0)
BICARBONATE: 15.1 mmol/L — AB (ref 20.0–28.0)
BICARBONATE: 13.8 mmol/L — AB (ref 20.0–28.0)
O2 SAT: 50 %
O2 SAT: 91 %
PCO2 ART: 31.6 mmHg — AB (ref 32.0–48.0)
PH ART: 7.241 — AB (ref 7.350–7.450)
PO2 ART: 37 mmHg — AB (ref 83.0–108.0)
Patient temperature: 98.4
Patient temperature: 96.2
TCO2: 17 mmol/L — ABNORMAL LOW (ref 22–32)
TCO2: 15 mmol/L — AB (ref 22–32)
pCO2 arterial: 51.7 mmHg — ABNORMAL HIGH (ref 32.0–48.0)
pH, Arterial: 7.075 — CL (ref 7.350–7.450)
pO2, Arterial: 66 mmHg — ABNORMAL LOW (ref 83.0–108.0)

## 2017-10-30 LAB — GLUCOSE, CAPILLARY
GLUCOSE-CAPILLARY: 101 mg/dL — AB (ref 70–99)
Glucose-Capillary: 127 mg/dL — ABNORMAL HIGH (ref 70–99)

## 2017-10-30 LAB — SODIUM, URINE, RANDOM: Sodium, Ur: 104 mmol/L

## 2017-10-30 LAB — D-DIMER, QUANTITATIVE: D-Dimer, Quant: 14.31 ug/mL-FEU — ABNORMAL HIGH (ref 0.00–0.50)

## 2017-10-30 LAB — I-STAT TROPONIN, ED: Troponin i, poc: 0.01 ng/mL (ref 0.00–0.08)

## 2017-10-30 LAB — PROCALCITONIN: PROCALCITONIN: 21.07 ng/mL

## 2017-10-30 LAB — MRSA PCR SCREENING: MRSA by PCR: NEGATIVE

## 2017-10-30 LAB — CREATININE, URINE, RANDOM: CREATININE, URINE: 42.52 mg/dL

## 2017-10-30 MED ORDER — AMLODIPINE BESYLATE 5 MG PO TABS: 5.0000 mg | ORAL_TABLET | Freq: Every day | ORAL | Status: DC

## 2017-10-30 MED ORDER — ACETAMINOPHEN 325 MG PO TABS: 650.0000 mg | ORAL_TABLET | Freq: Four times a day (QID) | ORAL | Status: DC | PRN

## 2017-10-30 MED ORDER — ADULT MULTIVITAMIN W/MINERALS CH: 1.0000 | ORAL_TABLET | Freq: Every day | ORAL | Status: DC

## 2017-10-30 MED ORDER — ONDANSETRON HCL 4 MG PO TABS: 4.0000 mg | ORAL_TABLET | Freq: Four times a day (QID) | ORAL | Status: DC | PRN

## 2017-10-30 MED ORDER — METOLAZONE 10 MG PO TABS
10.0000 mg | ORAL_TABLET | Freq: Once | ORAL | Status: AC
  Administered 2017-10-30: 10 mg
  Filled 2017-10-30: qty 1

## 2017-10-30 MED ORDER — SODIUM CHLORIDE 0.9 % IV SOLN
250.0000 mL | INTRAVENOUS | Status: DC | PRN
  Administered 2017-11-03: 250 mL via INTRAVENOUS

## 2017-10-30 MED ORDER — ATORVASTATIN CALCIUM 40 MG PO TABS: 40.0000 mg | ORAL_TABLET | Freq: Every day | ORAL | Status: DC

## 2017-10-30 MED ORDER — LORAZEPAM 2 MG/ML IJ SOLN
1.0000 mg | Freq: Once | INTRAMUSCULAR | Status: AC
  Administered 2017-10-30: 1 mg via INTRAVENOUS
  Filled 2017-10-30: qty 1

## 2017-10-30 MED ORDER — SODIUM BICARBONATE 8.4 % IV SOLN
INTRAVENOUS | Status: AC
  Administered 2017-10-30: 50 meq via INTRAVENOUS
  Filled 2017-10-30: qty 50

## 2017-10-30 MED ORDER — SODIUM CHLORIDE 0.9 % IV SOLN
1.0000 g | Freq: Once | INTRAVENOUS | Status: AC
  Administered 2017-10-30: 1 g via INTRAVENOUS
  Filled 2017-10-30: qty 1

## 2017-10-30 MED ORDER — INSULIN ASPART 100 UNIT/ML ~~LOC~~ SOLN
0.0000 [IU] | SUBCUTANEOUS | Status: DC
  Administered 2017-10-30 – 2017-11-01 (×4): 2 [IU] via SUBCUTANEOUS

## 2017-10-30 MED ORDER — NEPRO/CARBSTEADY PO LIQD
237.0000 mL | Freq: Every day | ORAL | Status: DC
  Filled 2017-10-30: qty 237

## 2017-10-30 MED ORDER — FUROSEMIDE 10 MG/ML IJ SOLN: 60.0000 mg | Freq: Once | INTRAMUSCULAR | Status: DC

## 2017-10-30 MED ORDER — SODIUM BICARBONATE 8.4 % IV SOLN
50.0000 meq | Freq: Once | INTRAVENOUS | Status: AC
  Administered 2017-10-30: 50 meq via INTRAVENOUS

## 2017-10-30 MED ORDER — MIDAZOLAM HCL 2 MG/2ML IJ SOLN
INTRAMUSCULAR | Status: AC
  Administered 2017-10-30: 2 mg via INTRAVENOUS
  Filled 2017-10-30: qty 2

## 2017-10-30 MED ORDER — ONDANSETRON HCL 4 MG/2ML IJ SOLN: 4.0000 mg | Freq: Four times a day (QID) | INTRAMUSCULAR | Status: DC | PRN

## 2017-10-30 MED ORDER — SODIUM CHLORIDE 0.9 % IV SOLN: 1.0000 g | INTRAVENOUS | Status: DC

## 2017-10-30 MED ORDER — FUROSEMIDE 10 MG/ML IJ SOLN
80.0000 mg | Freq: Once | INTRAMUSCULAR | Status: AC
  Administered 2017-10-30: 80 mg via INTRAVENOUS
  Filled 2017-10-30: qty 8

## 2017-10-30 MED ORDER — HEPARIN SODIUM (PORCINE) 5000 UNIT/ML IJ SOLN
5000.0000 [IU] | Freq: Three times a day (TID) | INTRAMUSCULAR | Status: DC
  Administered 2017-10-30 – 2017-10-31 (×3): 5000 [IU] via SUBCUTANEOUS
  Filled 2017-10-30 (×3): qty 1

## 2017-10-30 MED ORDER — VANCOMYCIN HCL IN DEXTROSE 1-5 GM/200ML-% IV SOLN: 1000.0000 mg | INTRAVENOUS | Status: DC

## 2017-10-30 MED ORDER — PROPOFOL 1000 MG/100ML IV EMUL
0.0000 ug/kg/min | INTRAVENOUS | Status: DC
  Administered 2017-10-30: 5 ug/kg/min via INTRAVENOUS
  Filled 2017-10-30: qty 100

## 2017-10-30 MED ORDER — VANCOMYCIN HCL 10 G IV SOLR
1250.0000 mg | Freq: Once | INTRAVENOUS | Status: AC
  Administered 2017-10-30: 1250 mg via INTRAVENOUS
  Filled 2017-10-30: qty 1250

## 2017-10-30 MED ORDER — SODIUM BICARBONATE 8.4 % IV SOLN: INTRAVENOUS | Status: DC

## 2017-10-30 MED ORDER — SODIUM BICARBONATE 8.4 % IV SOLN
INTRAVENOUS | Status: DC
  Administered 2017-10-30 – 2017-11-01 (×4): via INTRAVENOUS
  Filled 2017-10-30 (×4): qty 150

## 2017-10-30 MED ORDER — ACETAMINOPHEN 650 MG RE SUPP: 650.0000 mg | Freq: Four times a day (QID) | RECTAL | Status: DC | PRN

## 2017-10-30 MED ORDER — SODIUM BICARBONATE 650 MG PO TABS: 650.0000 mg | ORAL_TABLET | Freq: Three times a day (TID) | ORAL | Status: DC

## 2017-10-30 MED ORDER — NICOTINE 21 MG/24HR TD PT24
21.0000 mg | MEDICATED_PATCH | Freq: Every day | TRANSDERMAL | Status: DC
  Administered 2017-10-30 – 2017-10-31 (×2): 21 mg via TRANSDERMAL
  Filled 2017-10-30 (×2): qty 1

## 2017-10-30 MED ORDER — FENTANYL CITRATE (PF) 100 MCG/2ML IJ SOLN: 100.0000 ug | INTRAMUSCULAR | Status: DC | PRN

## 2017-10-30 MED ORDER — LORAZEPAM 2 MG/ML IJ SOLN
0.5000 mg | Freq: Once | INTRAMUSCULAR | Status: AC
  Administered 2017-10-30: 0.5 mg via INTRAVENOUS
  Filled 2017-10-30: qty 1

## 2017-10-30 MED ORDER — FUROSEMIDE 80 MG PO TABS: 160.0000 mg | ORAL_TABLET | Freq: Every day | ORAL | Status: DC

## 2017-10-30 MED ORDER — FENTANYL CITRATE (PF) 100 MCG/2ML IJ SOLN
100.0000 ug | INTRAMUSCULAR | Status: DC | PRN
  Administered 2017-10-30: 100 ug via INTRAVENOUS
  Filled 2017-10-30: qty 2

## 2017-10-30 MED ORDER — PANTOPRAZOLE SODIUM 40 MG PO PACK: 40.0000 mg | PACK | Freq: Every day | ORAL | Status: DC

## 2017-10-30 MED ORDER — ALBUTEROL SULFATE (2.5 MG/3ML) 0.083% IN NEBU
2.5000 mg | INHALATION_SOLUTION | RESPIRATORY_TRACT | Status: DC | PRN
  Administered 2017-10-31 – 2017-11-01 (×2): 2.5 mg via RESPIRATORY_TRACT
  Filled 2017-10-30 (×2): qty 3

## 2017-10-30 MED ORDER — LORAZEPAM 2 MG/ML IJ SOLN: 1.0000 mg | Freq: Once | INTRAMUSCULAR | Status: DC

## 2017-10-30 MED ORDER — FAMOTIDINE 20 MG PO TABS: 20.0000 mg | ORAL_TABLET | Freq: Every day | ORAL | Status: DC

## 2017-10-30 NOTE — Progress Notes (Signed)
[facility] Progress Note Patient Name: Diana Walters DOB: Nov 26, 1955 MRN: [id number]   Date of Service  11/10/2017  HPI/Events of Note  ABG on 100%/PRVC 32/TV 530/P 8 = 7.111/46.6/69.0. NaHCO2 IV bolus and IV infusion already started.   eICU Interventions  Will order: 1. Repeat ABG at 12 midnight.      Intervention Category Major Interventions: Acid-Base disturbance - evaluation and management;Respiratory failure - evaluation and management  Sommer,Steven Cornelia Copa 10/29/2017, 9:07 PM

## 2017-10-30 NOTE — Procedures (Signed)
Indication: Hypoxemia with hypotension  Description: The patient's identity was confirmed. Sedation was with etomidate 30/10 mg and paralysis with succinylcholine 75 mg. The patient's airway was approached via GlideScope and a 7.45mm ETT was advanced directly through the vocal cords. The tube was secured at 24 cm with confirmation via the CO2 monitor.  The patient tolerated the procedure well and subsequently transferred to the ICU. Tube position will be confirmed via PCXR.

## 2017-10-30 NOTE — Progress Notes (Signed)
ABG reviewed  PH 7.24, PCO2:31.6, PO2:66, Bicarb 13.8,  Patient appears much comfortable on biPAP. Ordered d5 with bicarb  Elevated ddimer of 14. Risk fo PE per well's criteria is 1.5%. Symptoms CXR findings with elevated wbc suggestive towards PNA. Doppler LLE negative for DVT. Will hold off on VQ scan unless symptoms worsen despite ABx and BiPAP.

## 2017-10-30 NOTE — ED Notes (Signed)
Report given to rn on 2w 

## 2017-10-30 NOTE — Progress Notes (Signed)
Sputum culture collected and sent to the lab. 

## 2017-10-30 NOTE — Procedures (Signed)
Indication: Hypotension with hypoxemia  Description: The right supra-and infraclavicular area was cleaned with chlorhexidine and draped in a sterile manner. After attaining local anesthesia with 1% lidocaine, the right subclavian vein was accessed. After entering the vein, the guidewire was threaded using the Seldinger technique. The needle was then removed and the site enlarged with a nick from a surgical blade. The vessel dilator was then passed and removed. The triple lumen catheter was then passed over the guidewire and the latter then removed. The catheter was then sutured into place. The patient tolerated the procedure well. Post procedure PCXR confirmed the tip in the R atrium. No pneumothorax was noted.

## 2017-10-30 NOTE — ED Notes (Signed)
Attempted report  rn not available tied up in room

## 2017-10-30 NOTE — Consult Note (Signed)
Diana Walters  DGU:[id number] DOB: 1955/03/12 DOA: 10/28/2017 PCP: Renato Shin, MD    LOS: 0 days   Reason for Consult / Chief Complaint:  Acute Respiratory Distress  Consulting MD and date:  TRH Dhungel, 9/15  HPI/Summary of hospital stay:  62 year old female with PMH of Tobacco Use, CVA, Anemia, HLD, Thoracic Aortic Aneurysm s/p endovascular stent graft 08/2017, anxiety, HTN, COPD, RA, CKD stage IV with new right arm fistula, Diastolic HF  Presents to ED On 9/15 with shortness of breath for last two days. Denies fever, chills, cough, or sick contacts. Reports chronic swelling to lower extremities. Upon arrival to ED temp 96.2, tachycardiac, tachypnea,  K 4.7, Crt 3.73, CXR with dense left side infiltrate vs pulmonary edema. Given Cefepime, Vancomycin and 80 meq lasix. Admitted with [facility].   Shortly after admission patient with progressive shortness of breath, placed on BiPAP however progressed to intubation. PCCM asked to consult.   Subjective:    Objective   Blood pressure 105/86, pulse 100, temperature 97.6 F (36.4 C), temperature source Axillary, resp. rate (!) 30, height 5\' 10"  (1.778 m), weight 63.5 kg, SpO2 93 %.    Vent Mode: PRVC FiO2 (%):  [10 %-100 %] 100 % Set Rate:  [24 bmp] 24 bmp Vt Set:  [590 mL] 590 mL PEEP:  [12 cmH20] 12 cmH20 Plateau Pressure:  [23 cmH20] 23 cmH20  No intake or output data in the 24 hours ending 11/09/2017 1919 Filed Weights   10/31/2017 1047  Weight: 63.5 kg    Examination: General: Elderly female, on vent  HENT: Dry MM  Lungs: Crackles throughout, no wheeze  Cardiovascular: RRR, no MRG  Abdomen: Soft, distended, active bowel sounds  Extremities: +2 BLE edema  Neuro: Sedated, pupils intact  GU: Foley in place   Consults: date of consult/date signed off & final recs:  PCCM 9/15 >>   Procedures: ETT 9/15 >> Right Subclavian CVC 9/15 >>   Significant Diagnostic Tests: CXR 9/15 > New large dense airspace opacities  within the RIGHT lower lung and overlying the RIGHT hilum. Findings are consistent with pneumonia or asymmetric pulmonary edema. LEFT lung remains relatively clear. 2. Cardiomediastinal silhouette appears stable. Aortic stent graft appears stable in configuration   Micro Data: Blood 9/15 >> Tracheal Asp 9/15 >> U/A 9/15 >>  Antimicrobials:  Cefepime 9/15 >> Vancomycin 9/15 >>   Resolved Hospital Problem list    Assessment & Plan:   Acute Hypoxic Respiratory Distress in setting of cardiogenic pulmonary edema secondary to decompensated heart failure with acute on chronic kidney disease vs ESRD -CXR with extensive right greater then left airspace disease > clinical picture suggest pulmonary edema  H/O COPD, Tobacco Use  Plan -Vent Support -Trend ABG/CXR > Repeat ABG 0000  -Pulmonary Hygiene  -Albuterol PRN  -VAP Bundle   Acute on Chronic Diastolic HF  -BNP >9563 S/P Thoracic Aneurysm Hybrid Repair  H/O HTN, HLD, Diastolic HF EF 87-56  Plan  -Cardiac Monitoring  -ECHO pending  -COOX Panel  -Maintain MAP >65 -Hold home HTN medications -Continue Lipitor   Acute on Chronic Stage IV Kidney Disease vs Progression to ESRD Crt Baseline 3.6-4.0 Anion Gap Metabolic Acidosis  Hyperkalemia  Plan  -Nephrology Consult in AM > Unless worsening of hyperkalemia, currently 5.1  -Trend BMP -Replace electrolytes as indicated   -LA pending  -Place Foley for strict I&O  -Continue Bicarb gtt at 75 ml/hr, Give 50 meq now  -Given 80 meq of  lasix in ED, will give metolazone 10 mg now followed by repeat 80 meq lasix   Anemia in setting of chronic disease  Plan  -Trend CBC  -Heparin SQ for DVT for prophylaxis  Leukocytosis, Likely Reactive  -CXR with PNA vs Pulmonary edema > Clinical picture suggest edema  Plan  -Trend WBC and Fever Curve -Follow Culture Data -Trend PCT and LA  -RVP pending  -Continue Cefepime and Vancomycin (Low Thresh hold to stop)   Hyperglycemia  Plan    -Trend Glucose  -SSI   Sedation Needs  H/O CVA, RA   Plan  -RASS goal 0/-1 -Wean Propofol to achieve RASS goal  -PRN fentanyl   Disposition / Summary of Today's Plan 10/17/2017   62 year old female with Chronic Kidney disease stage 4 and diastolic heart failure presents with acute hypoxic respiratory failure requiring intubation. CXR with pulmonary edema vs PNA.     DVT prophylaxis: Heparin SQ GI prophylaxis: Pepcid  Diet: NPO Mobility:Bedrest  Code Status: Full Code  Family Communication: Family updated at bedside  Labs   CBC: Recent Labs  Lab 11/02/2017 1024  WBC 17.5*  NEUTROABS 15.2*  HGB 9.5*  HCT 31.3*  MCV 86.5  PLT 161   Basic Metabolic Panel: Recent Labs  Lab 11/03/2017 1024 10/26/2017 1615  NA 140 141  K 4.7 4.8  CL 114* 113*  CO2 11* 12*  GLUCOSE 142* 144*  BUN 47* 50*  CREATININE 3.73* 3.93*  CALCIUM 8.7* 8.6*   GFR: Estimated Creatinine Clearance: 14.7 mL/min (A) (by C-G formula based on SCr of 3.93 mg/dL (H)). Recent Labs  Lab 10/19/2017 1024  WBC 17.5*   Liver Function Tests: No results for input(s): AST, ALT, ALKPHOS, BILITOT, PROT, ALBUMIN in the last 168 hours. No results for input(s): LIPASE, AMYLASE in the last 168 hours. No results for input(s): AMMONIA in the last 168 hours. ABG    Component Value Date/Time   PHART 7.241 (L) 10/27/2017 1520   PCO2ART 31.6 (L) 11/02/2017 1520   PO2ART 66.0 (L) 10/20/2017 1520   HCO3 13.8 (L) 10/22/2017 1520   TCO2 15 (L) 10/25/2017 1520   ACIDBASEDEF 13.0 (H) 10/23/2017 1520   O2SAT 91.0 10/21/2017 1520    Coagulation Profile: No results for input(s): INR, PROTIME in the last 168 hours. Cardiac Enzymes: No results for input(s): CKTOTAL, CKMB, CKMBINDEX, TROPONINI in the last 168 hours. HbA1C: Hgb A1c MFr Bld  Date/Time Value Ref Range Status  11/30/2012 05:53 AM 5.8 (H) <5.7 % Final    Comment:    (NOTE)                                                                       According to the  ADA Clinical Practice Recommendations for 2011, when HbA1c is used as a screening test:  >=6.5%   Diagnostic of Diabetes Mellitus           (if abnormal result is confirmed) 5.7-6.4%   Increased risk of developing Diabetes Mellitus References:Diagnosis and Classification of Diabetes Mellitus,Diabetes WRUE,4540,98(JXBJY 1):S62-S69 and Standards of Medical Care in         Diabetes - 2011,Diabetes NWGN,5621,30 (Suppl 1):S11-S61.   CBG: No results for input(s): GLUCAP in the last 168 hours.  Review of Systems:   Unable to obtain due to intubation  Past medical history  She,  has a past medical history of ANEMIA (10/17/2009), ANXIETY (10/28/2006), Arthritis, ASTHMA (10/28/2006), Blood transfusion without reported diagnosis, Depression, Duodenitis without mention of hemorrhage (2005), Heart murmur, HYPERCHOLESTEROLEMIA (04/01/2008), HYPERTENSION (10/28/2006), Menopause, Osteoporosis, SEIZURE DISORDER (04/01/2008), Stroke ([place]) (11/2012), and THORACIC AORTIC ANEURYSM (04/01/2008).   Surgical History    Past Surgical History:  Procedure Laterality Date  . AV FISTULA PLACEMENT Right 09/22/2017   Procedure: ARTERIOVENOUS (AV) FISTULA CREATION;  Surgeon: Waynetta Sandy, MD;  Location: [facility];  Service: Vascular;  Laterality: Right;  . ESOPHAGOGASTRODUODENOSCOPY  01/29/2004  . 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 LOOP RECORDER IMPLANT N/A 12/01/2012   Procedure: LOOP RECORDER IMPLANT;  Surgeon: Coralyn Mark, MD;  Location: [facility];  Service: Cardiovascular;  Laterality: N/A;  . TEE WITHOUT CARDIOVERSION N/A 12/01/2012   Procedure: TRANSESOPHAGEAL ECHOCARDIOGRAM (TEE);  Surgeon: Fay Records, MD;  Location: [facility];  Service: Cardiovascular;  Laterality: N/A;  . THORACIC AORTIC ENDOVASCULAR STENT GRAFT N/A 09/08/2017   Procedure: THORACIC AORTIC ENDOVASCULAR STENT GRAFT;  Surgeon: Waynetta Sandy, MD;  Location: [facility];   Service: Vascular;  Laterality: N/A;  . VASCULAR SURGERY  8-9 years ago per patient   thoracic aortic anyuresm repair with patch      Social History   Social History   Socioeconomic History  . Marital status: Single    Spouse name: Not on file  . Number of children: Not on file  . Years of education: Not on file  . Highest education level: Not on file  Occupational History  . Not on file  Social Needs  . Financial resource strain: Not on file  . Food insecurity:    Worry: Not on file    Inability: Not on file  . Transportation needs:    Medical: Not on file    Non-medical: Not on file  Tobacco Use  . Smoking status: Current Every Day Smoker    Packs/day: 0.25    Years: 12.00    Pack years: 3.00    Types: Cigarettes  . Smokeless tobacco: Never Used  . Tobacco comment: patient is ready to stop  Substance and Sexual Activity  . Alcohol use: Not Currently    Alcohol/week: 0.0 standard drinks  . Drug use: Yes    Types: Marijuana  . Sexual activity: Not Currently    Birth control/protection: None  Lifestyle  . Physical activity:    Days per week: Not on file    Minutes per session: Not on file  . Stress: Not on file  Relationships  . Social connections:    Talks on phone: Not on file    Gets together: Not on file    Attends religious service: Not on file    Active member of club or organization: Not on file    Attends meetings of clubs or organizations: Not on file    Relationship status: Not on file  . Intimate partner violence:    Fear of current or ex partner: Not on file    Emotionally abused: Not on file    Physically abused: Not on file    Forced sexual activity: Not on file  Other Topics Concern  . Not on file  Social History Narrative  . Not on file  ,  reports that she has been smoking cigarettes. She has  a 3.00 pack-year smoking history. She has never used smokeless tobacco. She reports that she drank alcohol. She reports that she has current or past  drug history. Drug: Marijuana.   Family history   Her family history includes Breast cancer in her mother; Heart disease in her mother. There is no history of Colon cancer, Esophageal cancer, Pancreatic cancer, Rectal cancer, or Stomach cancer.   Allergies Allergies  Allergen Reactions  . Ibuprofen Other (See Comments)    Can't take due to medications  PT DOESN'T RECALL THIS ALLERGY  . Codeine Other (See Comments)    Hallucinations  PT DOESN'T RECALL THIS ALLERGY    Home meds  Prior to Admission medications   Medication Sig Start Date End Date Taking? Authorizing Provider  acetaminophen (TYLENOL) 325 MG tablet Take 650 mg by mouth every 6 (six) hours as needed for moderate pain.    Yes [provider]  amLODipine (NORVASC) 5 MG tablet Take 1 tablet (5 mg total) by mouth daily. 07/05/17 11/08/2017 Yes Daune Perch, NP  atorvastatin (LIPITOR) 40 MG tablet Take 1 tablet (40 mg total) by mouth daily. 06/22/17  Yes Daune Perch, NP  famotidine (PEPCID) 20 MG tablet Take 1 tablet (20 mg total) by mouth daily. 09/23/17  Yes Hosie Poisson, MD  furosemide (LASIX) 80 MG tablet Take 2 tablets (160 mg total) by mouth daily. 09/23/17  Yes Hosie Poisson, MD  hydrALAZINE (APRESOLINE) 50 MG tablet Take 1.5 tablets (75 mg total) by mouth 3 (three) times daily. 06/22/17  Yes Daune Perch, NP  isosorbide mononitrate (IMDUR) 60 MG 24 hr tablet Take 1 tablet (60 mg total) by mouth daily. 06/22/17  Yes Daune Perch, NP  Menthol-Camphor (ICY HOT ADVANCED RELIEF) 16-11 % CREA Apply 1 application topically daily as needed (knee pain).   Yes [provider]  metoprolol tartrate (LOPRESSOR) 25 MG tablet Take 0.5 tablets (12.5 mg total) by mouth daily. 09/23/17  Yes Hosie Poisson, MD  Multiple Vitamin (MULTIVITAMIN) tablet Take 1 tablet by mouth daily.     Yes [provider]  Nutritional Supplements (FEEDING SUPPLEMENT, NEPRO CARB STEADY,) LIQD Take 237 mLs by mouth at bedtime. 09/23/17  Yes  Hosie Poisson, MD  oxyCODONE-acetaminophen (PERCOCET/ROXICET) 5-325 MG tablet Take 1-2 tablets by mouth every 4 (four) hours as needed for moderate pain. 09/23/17  Yes Hosie Poisson, MD  Simethicone (GAS-X PO) Take 2 tablets by mouth daily as needed (flatulence).    Yes [provider]  sodium bicarbonate 650 MG tablet Take 650 mg by mouth 3 (three) times daily. 08/19/17  Yes [provider]  patiromer (VELTASSA) 8.4 g packet Take 1 packet (8.4 g total) by mouth daily. Patient not taking: Reported on 10/25/2017 09/23/17   Hosie Poisson, MD    CC Time: 49 minutes   Hayden Pedro, AGACNP-BC [facility]  Pgr: [phone]  PCCM Pgr: [phone]

## 2017-10-30 NOTE — ED Triage Notes (Signed)
Pt arrived via [facility], per EMS pt from home with c/o SOB and diaphoretic last two days; Hx of Anxiety and having panic attack since 4a; 93% on RA; placed on 3L via [place] and sating 97%; pt rec'd 2.5mg  of Haldol IM; 148/90; 98; 97% on O2;

## 2017-10-30 NOTE — Progress Notes (Signed)
VASCULAR LAB PRELIMINARY  PRELIMINARY  PRELIMINARY  PRELIMINARY  Left lower extremity venous duplex completed.    Preliminary report:  There is no DVT or SVT noted in the left lower extremity.  Gave results to [facility], PA-C  Gracen Southwell, [place], RVT 10/23/2017, 2:27 PM

## 2017-10-30 NOTE — ED Notes (Signed)
Received verbal order from MD for RN to place external jugular.

## 2017-10-30 NOTE — Progress Notes (Signed)
gradually increased oxygen to 80% from 50% due to desaturation to 82%.

## 2017-10-30 NOTE — ED Provider Notes (Signed)
[facility] EMERGENCY DEPARTMENT Provider Note   CSN: [id number] Arrival date & time: 10/22/2017  1008     History   Chief Complaint Chief Complaint  Patient presents with  . Shortness of Breath    HPI Diana Walters is a 62 y.o. female with history of tobacco abuse, prior stroke, anemia, hypercholesterolemia, thoracic aortic aneurysm s/p endovascular stent graft 07/25, anxiety, hypertension, COPD, RA, CKD stage IV, and CHF (last EF 50-55%) who presents to the ED via EMS with complaints of dyspnea over the past 2 days. Patient states her shortness of breath is constant, waxing/waning in severity without specific alleviating/aggravating factors. She has had some congestion and dry cough. She has felt sweaty/diaphoretic today. She also mentions that 2-3 days ago she had some abdominal discomfort that was generalized and alleviated with 2 episodes of non bloody diarrhea and 2 episodes of non bloody emesis- no reoccurrence.  She reports that she does have some swelling in the left upper extremity which has been there for a while, she also has some baseline swelling to the left knee and ankle, however she has developed swelling to the calf as well, she is unsure what timeframe this is over.  She is currently on oxygen, she reports to me that she does not wear oxygen at baseline.  Denies fever, chills, chest pain, lightheadedness, dizziness, or syncope.   Per EMS gave Haldol due to agitation/anxiousness.   HPI  Past Medical History:  Diagnosis Date  . ANEMIA 10/17/2009  . ANXIETY 10/28/2006  . Arthritis   . ASTHMA 10/28/2006   no inhalers per pt  . Blood transfusion without reported diagnosis   . Depression   . Duodenitis without mention of hemorrhage 2005  . Heart murmur   . HYPERCHOLESTEROLEMIA 04/01/2008  . HYPERTENSION 10/28/2006  . Menopause    age 62  . Osteoporosis    bil knees  . SEIZURE DISORDER 04/01/2008   past- medications caused it   . Stroke ([place]) 11/2012    . THORACIC AORTIC ANEURYSM 04/01/2008    Patient Active Problem List   Diagnosis Date Noted  . Symptomatic anemia 09/08/2017  . Tobacco abuse 08/03/2017  . Elevated troponin 06/10/2017  . Acute systolic CHF (congestive heart failure) ([place]) 06/10/2017  . CKD (chronic kidney disease) stage 4, GFR 15-29 ml/min (H[facility]) 06/10/2017  . Anemia of chronic disease 06/10/2017  . Bradycardia 06/10/2017  . Acute respiratory failure ([place]) 06/05/2017  . Seizure disorder ([place]) 06/07/2016  . Hypertensive cardiomegaly 11/03/2013  . History of stroke 11/03/2013  . Heart murmur, systolic 51/88/4166  . Encounter for screening colonoscopy 09/12/2013  . Goiter, simple 07/06/2013  . Routine general medical examination at a health care facility 07/06/2013  . Acute CVA (cerebrovascular accident) ([place]) 11/29/2012  . Iron deficiency anemia, unspecified 06/16/2012  . Encounter for long-term current use of medication 05/17/2012  . Arthralgia 05/17/2012  . Menopause 11/27/2010  . Hematuria 11/27/2010  . KNEE PAIN, LEFT 02/18/2010  . HYPERCHOLESTEROLEMIA 04/01/2008  . Aneurysm of thoracic aorta ([place]) 04/01/2008  . Convulsions ([place]) 04/01/2008  . C O P D 05/29/2007  . PRURITUS 05/25/2007  . ANXIETY 10/28/2006  . Essential hypertension 10/28/2006  . ASTHMA 10/28/2006  . Rheumatoid arthritis ([place]) 10/28/2006    Past Surgical History:  Procedure Laterality Date  . AV FISTULA PLACEMENT Right 09/22/2017   Procedure: ARTERIOVENOUS (AV) FISTULA CREATION;  Surgeon: Waynetta Sandy, MD;  Location: [facility];  Service: Vascular;  Laterality: Right;  . ESOPHAGOGASTRODUODENOSCOPY  01/29/2004  . 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 LOOP RECORDER IMPLANT N/A 12/01/2012   Procedure: LOOP RECORDER IMPLANT;  Surgeon: Coralyn Mark, MD;  Location: [facility] CATH LAB;  Service: Cardiovascular;  Laterality: N/A;  . TEE WITHOUT CARDIOVERSION N/A 12/01/2012   Procedure:  TRANSESOPHAGEAL ECHOCARDIOGRAM (TEE);  Surgeon: Fay Records, MD;  Location: [facility];  Service: Cardiovascular;  Laterality: N/A;  . THORACIC AORTIC ENDOVASCULAR STENT GRAFT N/A 09/08/2017   Procedure: THORACIC AORTIC ENDOVASCULAR STENT GRAFT;  Surgeon: Waynetta Sandy, MD;  Location: [facility];  Service: Vascular;  Laterality: N/A;  . VASCULAR SURGERY  8-9 years ago per patient   thoracic aortic anyuresm repair with patch      OB History   None      Home Medications    Prior to Admission medications   Medication Sig Start Date End Date Taking? Authorizing Provider  acetaminophen (TYLENOL) 325 MG tablet Take 650 mg by mouth every 6 (six) hours as needed for moderate pain.     [provider]  amLODipine (NORVASC) 5 MG tablet Take 1 tablet (5 mg total) by mouth daily. 07/05/17 10/03/17  Daune Perch, NP  atorvastatin (LIPITOR) 40 MG tablet Take 1 tablet (40 mg total) by mouth daily. 06/22/17   Daune Perch, NP  famotidine (PEPCID) 20 MG tablet Take 1 tablet (20 mg total) by mouth daily. 09/23/17   Hosie Poisson, MD  furosemide (LASIX) 80 MG tablet Take 2 tablets (160 mg total) by mouth daily. 09/23/17   Hosie Poisson, MD  hydrALAZINE (APRESOLINE) 50 MG tablet Take 1.5 tablets (75 mg total) by mouth 3 (three) times daily. 06/22/17   Daune Perch, NP  isosorbide mononitrate (IMDUR) 60 MG 24 hr tablet Take 1 tablet (60 mg total) by mouth daily. 06/22/17   Daune Perch, NP  Menthol-Camphor (ICY HOT ADVANCED RELIEF) 16-11 % CREA Apply 1 application topically daily as needed (knee pain).    [provider]  metoprolol tartrate (LOPRESSOR) 25 MG tablet Take 0.5 tablets (12.5 mg total) by mouth daily. 09/23/17   Hosie Poisson, MD  Multiple Vitamin (MULTIVITAMIN) tablet Take 1 tablet by mouth daily.      [provider]  Nutritional Supplements (FEEDING SUPPLEMENT, NEPRO CARB STEADY,) LIQD Take 237 mLs by mouth at bedtime. 09/23/17   Hosie Poisson, MD    oxyCODONE-acetaminophen (PERCOCET/ROXICET) 5-325 MG tablet Take 1-2 tablets by mouth every 4 (four) hours as needed for moderate pain. 09/23/17   Hosie Poisson, MD  patiromer (VELTASSA) 8.4 g packet Take 1 packet (8.4 g total) by mouth daily. 09/23/17   Hosie Poisson, MD  Simethicone (GAS-X PO) Take 2 tablets by mouth daily as needed (flatulence).     [provider]  sodium bicarbonate 650 MG tablet Take 650 mg by mouth 3 (three) times daily. 08/19/17   [provider]    Family History Family History  Problem Relation Age of Onset  . Breast cancer Mother   . Heart disease Mother        before age 77  . Colon cancer Neg Hx   . Esophageal cancer Neg Hx   . Pancreatic cancer Neg Hx   . Rectal cancer Neg Hx   . Stomach cancer Neg Hx     Social History Social History   Tobacco Use  . Smoking status: Current Every Day Smoker    Packs/day: 0.25    Years: 12.00  Pack years: 3.00    Types: Cigarettes  . Smokeless tobacco: Never Used  . Tobacco comment: patient is ready to stop  Substance Use Topics  . Alcohol use: Not Currently    Alcohol/week: 0.0 standard drinks  . Drug use: Yes    Types: Marijuana     Allergies   Ibuprofen and Codeine   Review of Systems Review of Systems  Constitutional: Positive for diaphoresis. Negative for chills and fever.  HENT: Positive for congestion.   Respiratory: Positive for cough and shortness of breath.   Cardiovascular: Positive for leg swelling. Negative for chest pain and palpitations.  Gastrointestinal: Positive for abdominal pain (resolved at present, has not re-occurred), diarrhea (resolved at present, has not re-occurred), nausea (resolved at present, has not re-occurred) and vomiting (resolved at present, has not re-occurred). Negative for blood in stool.  Neurological: Negative for dizziness, syncope, weakness, light-headedness and numbness.  Psychiatric/Behavioral: The patient is nervous/anxious.   All other  systems reviewed and are negative.   Physical Exam Updated Vital Signs There were no vitals taken for this visit.  Physical Exam  Constitutional: She appears well-developed and well-nourished.  HENT:  Head: Normocephalic and atraumatic.  Nose: Mucosal edema present.  Cardiovascular: Regular rhythm. Tachycardia present.  Pulses:      Radial pulses are 1+ on the right side, and 1+ on the left side.       Dorsalis pedis pulses are 2+ on the right side, and 2+ on the left side.       Posterior tibial pulses are 2+ on the right side, and 2+ on the left side.  Palpable AV fistula in the RUE  Pulmonary/Chest:  Patient has course breath sounds throughout, most so at the bases with what seems to be rhonchi/rales R>L. She is tachypneic. She is requiring 6L via [place] to maintain SpO2 of 90%. Speaking in short sentences.    Abdominal: Bowel sounds are normal. She exhibits no distension. There is no tenderness.  Musculoskeletal:  Patient has 1+ edema to the right foot/ankle.  She has 3+ pitting edema to the left lower extremity extending to just above the knee.  She has 1-2+ pitting edema to the left upper extremity.  There is no overlying erythema or warmth.  No open wounds.  No areas of significant tenderness to palpation.  Neurological: She is alert.  Skin: She is diaphoretic.  Psychiatric: Her mood appears anxious. She is agitated.   ED Treatments / Results  Labs (all labs ordered are listed, but only abnormal results are displayed) Labs Reviewed  BASIC METABOLIC PANEL - Abnormal; Notable for the following components:      Result Value   Chloride 114 (*)    CO2 11 (*)    Glucose, Bld 142 (*)    BUN 47 (*)    Creatinine, Ser 3.73 (*)    Calcium 8.7 (*)    GFR calc non Af Amer 12 (*)    GFR calc Af Amer 14 (*)    All other components within normal limits  BRAIN NATRIURETIC PEPTIDE - Abnormal; Notable for the following components:   B Natriuretic Peptide >4,500.0 (*)    All other  components within normal limits  CBC WITH DIFFERENTIAL/PLATELET - Abnormal; Notable for the following components:   WBC 17.5 (*)    RBC 3.62 (*)    Hemoglobin 9.5 (*)    HCT 31.3 (*)    RDW 23.3 (*)    Neutro Abs 15.2 (*)    All  other components within normal limits  I-STAT TROPONIN, ED    EKG EKG Interpretation  Date/Time:  Sunday October 30 2017 10:25:05 EDT Ventricular Rate:  97 PR Interval:  160 QRS Duration: 90 QT Interval:  364 QTC Calculation: 462 R Axis:   58 Text Interpretation:  Normal sinus rhythm Possible Left atrial enlargement Left ventricular hypertrophy Nonspecific ST and T wave abnormality Abnormal ECG When compared to prior, faster rate.  No STEMI Confirmed by Antony Blackbird [phone]) on 10/26/2017 10:37:04 AM   Radiology Dg Chest Portable 1 View  Result Date: 10/18/2017 CLINICAL DATA:  Shortness of breath and diaphoretic for the last 2 days. EXAM: PORTABLE CHEST 1 VIEW COMPARISON:  Chest x-ray dated 09/08/2017. FINDINGS: Large dense airspace opacities within the RIGHT lower lung, and overlying the RIGHT hilum, pneumonia versus asymmetric edema. LEFT lung remains relatively clear. No pneumothorax seen. No pleural effusions seen. Cardiomediastinal silhouette appears stable in size and configuration. Aortic stent graft appears stable in configuration. No acute or suspicious osseous finding. IMPRESSION: 1. New large dense airspace opacities within the RIGHT lower lung and overlying the RIGHT hilum. Findings are consistent with pneumonia or asymmetric pulmonary edema. LEFT lung remains relatively clear. 2. Cardiomediastinal silhouette appears stable. Aortic stent graft appears stable in configuration. Electronically Signed   By: Franki Cabot M.D.   On: 10/29/2017 11:20    Procedures Procedures (including critical care time)  CRITICAL CARE Performed by: Kennith Maes   Total critical care time: 35 minutes  Critical care time was exclusive of separately  billable procedures and treating other patients.  Critical care was necessary to treat or prevent imminent or life-threatening deterioration.  Critical care was time spent personally by me on the following activities: development of treatment plan with patient and/or surrogate as well as nursing, discussions with consultants, evaluation of patient's response to treatment, examination of patient, obtaining history from patient or surrogate, ordering and performing treatments and interventions, ordering and review of laboratory studies, ordering and review of radiographic studies, pulse oximetry and re-evaluation of patient's condition.  Medications Ordered in ED Medications  ceFEPIme (MAXIPIME) 1 g in sodium chloride 0.9 % 100 mL IVPB (has no administration in time range)  LORazepam (ATIVAN) injection 0.5 mg (0.5 mg Intravenous Given 10/22/2017 1056)     Prior Results:  Echocardiogram 09/06/17:  Study Conclusions  - Left ventricle: The cavity size was normal. There was moderate   concentric hypertrophy. Systolic function was normal. The   estimated ejection fraction was in the range of 50% to 55%. Wall   motion was normal; there were no regional wall motion   abnormalities. Features are consistent with a pseudonormal left   ventricular filling pattern, with concomitant abnormal relaxation   and increased filling pressure (grade 2 diastolic dysfunction). - Aortic valve: There was trivial regurgitation. - Mitral valve: Severe posterior MAC. There was mild regurgitation. - Left atrium: The atrium was severely dilated. - Right atrium: The atrium was moderately dilated. - Atrial septum: No defect or patent foramen ovale was identified   on this study, though has been reported on prior TEE. - Tricuspid valve: There was moderate regurgitation. - Pulmonary arteries: PA peak pressure: 32 mm Hg (S).  Impressions:  - EF improved from prior study, returned to her baseline. Grade 2   diastolic  dysfunction. Calcified aortic and mitral valves without   stenosis.  Initial Impression / Assessment and Plan / ED Course  I have reviewed the triage vital signs and the nursing notes.  Pertinent labs & imaging results that were available during my care of the patient were reviewed by me and considered in my medical decision making (see chart for details).   Patient presents to the emergency department via EMS for dyspnea over the past 2 days.  Upon arrival to the emergency department patient is diaphoretic, anxious, tachypneic with SPO2 at 90% on 6 L via nasal cannula.  She appears very anxious and concerned. She is tachycardic. Her lungs are coarse throughout with rhonchi and rales, R>L.  She is noted to have edema to her left upper and lower extremities as well as her right distal lower extremity.  Reports that the left lower extremety has been this way for a while- per chart review she has had negative LUE US DVT studies in July and August of this year.  She reports she typically has swelling to the right lower extremity at the foot and ankle and swelling to left lower extremity at the knee and the ankle, however she is now swollen at the calf of the left lower extremity as well.  Will obtain labs including troponin and BNP, portable chest x-ray, as well as left lower extremity venous duplex ultrasound. Ativan given to help calm the patient.  Dr. Sherry Ruffing, supervising physician, aware patient in agreement with plan thus far, would not start BiPAP given patient agitation/anxiety at this time, I am in agreement with this.   Work-up is notable for right lower lung opacity, pneumonia versus asymmetric pulmonary edema.  She does have leukocytosis with white blood cell count of 17.5 with left shift.  She is slightly more anemic than prior visits with hgb of 9.5.  Her renal function is poor as it has been previously, creatinine is 3.73, BUN is 47.  Her BNP is elevated greater than 4500 which is similar to  prior BNP on record.  Her troponin is negative, EKG with faster rate when compared to prior, lower suspicion for ACS.   Suspect pneumonia in setting of possible CHF exacerbation based on presentation and work-up in the ER. Patient with identifiable source to her sxs, however pulmonary embolism and dissection remain on DDX- in the setting of this patient with poor renal function w/ AV fistula in place- not currently on dialysis will initiate tx for HCAP with vancomycin and cefepime in the ER and defer further imaging and Lasix administration decisions to the admitting team.   11:30: RE-EVAL: Patient resting more comfortably. I did remove oxygen and patient's SPO2 fluctuated anywhere from 88% to 99% on RA, oxygen placed back on for symptomatic relief and to ensure remains > 90%.   Feel that patient requires admission for further tx of her HCAP w/ possible CHF exacerbation as well as need for possible further evaluation.  She also has somewhat of a new oxygen requirement variably, feel she may benefit from BiPAP. Findings and plan of care discussed with supervising physician Dr. Sherry Ruffing who personally evaluated and examined this patient and is in agreement with treatment/evaluation/plan.   12:33: CONSULT: Discussed case with hospitalist Dr. Clementeen Graham, including above deferred management decisions, accepts admission, requesting we place bed request to telemetry, this was performed.   LLE venous duplex negative for DVT.   14:15: Patient remains in the ED ABG with findings of acidosis pH of 7.073, CO2 51.9, patient being placed on BiPAP.   Final Clinical Impressions(s) / ED Diagnoses   Final diagnoses:  HCAP (healthcare-associated pneumonia)  Acute respiratory failure with hypoxia [facility])    ED Discharge  Orders    None       Amaryllis Dyke, PA-C 10/20/2017 1647    Tegeler, Gwenyth Allegra, MD 10/27/2017 [phone]

## 2017-10-30 NOTE — Progress Notes (Signed)
Attempted to receive report, per [address] CN we cannot accept patient on continuous non-rebreather.

## 2017-10-30 NOTE — ED Notes (Signed)
Iv team here for another iv attempt

## 2017-10-30 NOTE — H&P (Addendum)
TRH H&P   Patient Demographics:    Diana Walters, is a 62 y.o. female  MRN: [id number]   DOB - 06-24-1955  Admit Date - 10/29/2017  Outpatient Primary MD for the patient is Renato Shin, MD  Referring MD: ED  Outpatient Specialists: renal, vascular surgery ( Dr Donzetta Matters)  Patient coming from: home  Chief Complaint  Patient presents with  . Shortness of Breath      HPI:    Diana Walters  is a 62 y.o. female, with a history of tobacco abuse, prior stroke, anemia, hyperlipidemia, thoracic aortic aneurysm status post endovascular stent graft in July this year, anxiety, hypertension, COPD not on home O2, rheumatoid arthritis, CKD stage IV, diastolic CHF who was brought to the ED by EMS with progressive shortness of breath for past 2 days.  Patient denies any aggravating or relieving symptoms.  She reported chest congestion and nonproductive cough.  She reports feeling diaphoretic this morning.  Also reported having some abdominal discomfort with some loose bowel movement and 2 episodes of vomiting about 2 days back.  Patient reports having chronic swelling of her left lower extremity.  She reports shortness of breath at rest but denies any orthopnea or PND.  Denies any fevers, chills, headache, blurred vision, dizziness, hematemesis, melena, dysuria, tingling, numbness or weakness of the extremities.  Denies any loss of consciousness.  Denies recent travel.  She denies alcohol use but does smoke marijuana, continues to smoke cigarettes.  Denies other illicit drug use. Patient received a dose of Haldol due to agitation.  Course in the ED Patient's temperature was 96.2 F, tachycardic to low 100s, tachypneic to 30s, initial blood pressure was 79/23 which improved to 150/100 mmHg.  Patient was restless and agitated in the ED with sats dropping to 88 based on a nonrebreather.  Blood work showed  WBC of 17 point 5K, hemoglobin of 9.5, sodium of 140, potassium 4.7, chloride of 114, CO2 of 11 with anion gap of 15, BUN of 47 and creatinine of 3.73, glucose of 142. Chest x-ray shows dense left-sided infiltrate versus asymmetric edema.  Patient given a dose of IV vancomycin and cefepime with concern for healthcare associated pneumonia in the ED.  Also received 0.5 mg of Ativan for agitation. Hospitalist consulted for admission to stepdown unit. During my evaluation she complains of shortness of breath and feeling sweaty.  Review of systems:    In addition to the HPI above,  No Fever-chills, diaphoresis++ No Headache, No changes with Vision or hearing, No problems swallowing food or Liquids, No chest pain, nonproductive cough and shortness of breath,  No Abdominal pain, no nausea or vomiting, bowel movements regular No Blood in stool or Urine, No dysuria, No new skin rashes or bruises, No new joints pains-aches,  No new weakness, tingling, numbness in any extremity, No recent weight gain or loss, No polyuria, polydypsia  or polyphagia, No significant Mental Stressors.     With Past History of the following :    Past Medical History:  Diagnosis Date  . ANEMIA 10/17/2009  . ANXIETY 10/28/2006  . Arthritis   . ASTHMA 10/28/2006   no inhalers per pt  . Blood transfusion without reported diagnosis   . Depression   . Duodenitis without mention of hemorrhage 2005  . Heart murmur   . HYPERCHOLESTEROLEMIA 04/01/2008  . HYPERTENSION 10/28/2006  . Menopause    age 21  . Osteoporosis    bil knees  . SEIZURE DISORDER 04/01/2008   past- medications caused it   . Stroke ([place]) 11/2012  . THORACIC AORTIC ANEURYSM 04/01/2008      Past Surgical History:  Procedure Laterality Date  . AV FISTULA PLACEMENT Right 09/22/2017   Procedure: ARTERIOVENOUS (AV) FISTULA CREATION;  Surgeon: Waynetta Sandy, MD;  Location: [facility];  Service: Vascular;  Laterality: Right;  .  ESOPHAGOGASTRODUODENOSCOPY  01/29/2004  . 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 LOOP RECORDER IMPLANT N/A 12/01/2012   Procedure: LOOP RECORDER IMPLANT;  Surgeon: Coralyn Mark, MD;  Location: [facility] CATH [facility];  Service: Cardiovascular;  Laterality: N/A;  . TEE WITHOUT CARDIOVERSION N/A 12/01/2012   Procedure: TRANSESOPHAGEAL ECHOCARDIOGRAM (TEE);  Surgeon: Fay Records, MD;  Location: [facility];  Service: Cardiovascular;  Laterality: N/A;  . THORACIC AORTIC ENDOVASCULAR STENT GRAFT N/A 09/08/2017   Procedure: THORACIC AORTIC ENDOVASCULAR STENT GRAFT;  Surgeon: Waynetta Sandy, MD;  Location: [facility];  Service: Vascular;  Laterality: N/A;  . VASCULAR SURGERY  8-9 years ago per patient   thoracic aortic anyuresm repair with patch       Social History:     Social History   Tobacco Use  . Smoking status: Current Every Day Smoker    Packs/day: 0.25    Years: 12.00    Pack years: 3.00    Types: Cigarettes  . Smokeless tobacco: Never Used  . Tobacco comment: patient is ready to stop  Substance Use Topics  . Alcohol use: Not Currently    Alcohol/week: 0.0 standard drinks     Lives -home  Mobility -independent     Family History :     Family History  Problem Relation Age of Onset  . Breast cancer Mother   . Heart disease Mother        before age 67  . Colon cancer Neg Hx   . Esophageal cancer Neg Hx   . Pancreatic cancer Neg Hx   . Rectal cancer Neg Hx   . Stomach cancer Neg Hx       Home Medications:   Prior to Admission medications   Medication Sig Start Date End Date Taking? Authorizing Provider  acetaminophen (TYLENOL) 325 MG tablet Take 650 mg by mouth every 6 (six) hours as needed for moderate pain.    Yes [provider]  amLODipine (NORVASC) 5 MG tablet Take 1 tablet (5 mg total) by mouth daily. 07/05/17 11/04/2017 Yes Daune Perch, NP  atorvastatin (LIPITOR) 40 MG tablet Take 1 tablet  (40 mg total) by mouth daily. 06/22/17  Yes Daune Perch, NP  famotidine (PEPCID) 20 MG tablet Take 1 tablet (20 mg total) by mouth daily. 09/23/17  Yes Hosie Poisson, MD  furosemide (LASIX) 80 MG tablet Take 2 tablets (160 mg total) by mouth daily. 09/23/17  Yes Hosie Poisson, MD  hydrALAZINE (APRESOLINE) 50 MG  tablet Take 1.5 tablets (75 mg total) by mouth 3 (three) times daily. 06/22/17  Yes Daune Perch, NP  isosorbide mononitrate (IMDUR) 60 MG 24 hr tablet Take 1 tablet (60 mg total) by mouth daily. 06/22/17  Yes Daune Perch, NP  Menthol-Camphor (ICY HOT ADVANCED RELIEF) 16-11 % CREA Apply 1 application topically daily as needed (knee pain).   Yes [provider]  metoprolol tartrate (LOPRESSOR) 25 MG tablet Take 0.5 tablets (12.5 mg total) by mouth daily. 09/23/17  Yes Hosie Poisson, MD  Multiple Vitamin (MULTIVITAMIN) tablet Take 1 tablet by mouth daily.     Yes [provider]  Nutritional Supplements (FEEDING SUPPLEMENT, NEPRO CARB STEADY,) LIQD Take 237 mLs by mouth at bedtime. 09/23/17  Yes Hosie Poisson, MD  oxyCODONE-acetaminophen (PERCOCET/ROXICET) 5-325 MG tablet Take 1-2 tablets by mouth every 4 (four) hours as needed for moderate pain. 09/23/17  Yes Hosie Poisson, MD  Simethicone (GAS-X PO) Take 2 tablets by mouth daily as needed (flatulence).    Yes [provider]  sodium bicarbonate 650 MG tablet Take 650 mg by mouth 3 (three) times daily. 08/19/17  Yes [provider]  patiromer (VELTASSA) 8.4 g packet Take 1 packet (8.4 g total) by mouth daily. Patient not taking: Reported on 11/08/2017 09/23/17   Hosie Poisson, MD     Allergies:     Allergies  Allergen Reactions  . Ibuprofen Other (See Comments)    Can't take due to medications  PT DOESN'T RECALL THIS ALLERGY  . Codeine Other (See Comments)    Hallucinations  PT DOESN'T RECALL THIS ALLERGY     Physical Exam:   Vitals  Blood pressure (!) 123/100, pulse (!) 111, temperature (!) 96.2 F  (35.7 C), resp. rate (!) 30, height 5\' 10"  (1.778 m), weight 63.5 kg, SpO2 94 %.   General: Middle-aged female lying in bed on nonrebreather, tachypneic and diaphoretic HEENT: Pupils reactive bilaterally, EOMI, on nonrebreather, pallor present, no icterus, moist mucosa, supple neck Chest: Tachypneic, diffuse rhonchi bilaterally, right basilar crackles CVS: S1-S2 tachycardic, no murmurs rub or gallop GI: Soft, nondistended, nontender, bowel sounds present Musculoskeletal: Warm, no edema, swelling over the left calf and lower leg, normal skin CNS: Restless, alert and oriented, nonfocal   Data Review:    CBC Recent Labs  Lab 11/12/2017 1024  WBC 17.5*  HGB 9.5*  HCT 31.3*  PLT 376  MCV 86.5  MCH 26.2  MCHC 30.4  RDW 23.3*  LYMPHSABS 1.6  MONOABS 0.7  EOSABS 0.0  BASOSABS 0.0   ------------------------------------------------------------------------------------------------------------------  Chemistries  Recent Labs  Lab 10/18/2017 1024  NA 140  K 4.7  CL 114*  CO2 11*  GLUCOSE 142*  BUN 47*  CREATININE 3.73*  CALCIUM 8.7*   ------------------------------------------------------------------------------------------------------------------ estimated creatinine clearance is 15.5 mL/min (A) (by C-G formula based on SCr of 3.73 mg/dL (H)). ------------------------------------------------------------------------------------------------------------------ No results for input(s): TSH, T4TOTAL, T3FREE, THYROIDAB in the last 72 hours.  Invalid input(s): FREET3  Coagulation profile No results for input(s): INR, PROTIME in the last 168 hours. ------------------------------------------------------------------------------------------------------------------- No results for input(s): DDIMER in the last 72 hours. -------------------------------------------------------------------------------------------------------------------  Cardiac Enzymes No results for input(s): CKMB,  TROPONINI, MYOGLOBIN in the last 168 hours.  Invalid input(s): CK ------------------------------------------------------------------------------------------------------------------    Component Value Date/Time   BNP >4,500.0 (H) 10/25/2017 1024     ---------------------------------------------------------------------------------------------------------------  Urinalysis    Component Value Date/Time   COLORURINE YELLOW 09/07/2017 2340   APPEARANCEUR CLEAR 09/07/2017 2340   LABSPEC 1.014 09/07/2017 2340  PHURINE 6.0 09/07/2017 2340   GLUCOSEU NEGATIVE 09/07/2017 2340   GLUCOSEU NEGATIVE 07/06/2013 1105   HGBUR NEGATIVE 09/07/2017 2340   BILIRUBINUR NEGATIVE 09/07/2017 2340   KETONESUR NEGATIVE 09/07/2017 2340   PROTEINUR 100 (A) 09/07/2017 2340   UROBILINOGEN 0.2 07/06/2013 1105   NITRITE NEGATIVE 09/07/2017 2340   LEUKOCYTESUR NEGATIVE 09/07/2017 2340    ----------------------------------------------------------------------------------------------------------------   Imaging Results:    Dg Chest Portable 1 View  Result Date: 10/26/2017 CLINICAL DATA:  Shortness of breath and diaphoretic for the last 2 days. EXAM: PORTABLE CHEST 1 VIEW COMPARISON:  Chest x-ray dated 09/08/2017. FINDINGS: Large dense airspace opacities within the RIGHT lower lung, and overlying the RIGHT hilum, pneumonia versus asymmetric edema. LEFT lung remains relatively clear. No pneumothorax seen. No pleural effusions seen. Cardiomediastinal silhouette appears stable in size and configuration. Aortic stent graft appears stable in configuration. No acute or suspicious osseous finding. IMPRESSION: 1. New large dense airspace opacities within the RIGHT lower lung and overlying the RIGHT hilum. Findings are consistent with pneumonia or asymmetric pulmonary edema. LEFT lung remains relatively clear. 2. Cardiomediastinal silhouette appears stable. Aortic stent graft appears stable in configuration. Electronically  Signed   By: Franki Cabot M.D.   On: 11/11/2017 11:20    My personal review of EKG: Normal sinus rhythm at 97 with LVH, no ST-T changes   Assessment & Plan:    Principal Problem:   Acute respiratory failure with hypoxia ([place]) Possibly secondary to healthcare associated pneumonia.  Admit to stepdown unit.  Currently on NRB but will need BiPAP.  Check ABG.  Albuterol neb as needed.  Supportive care with Tylenol and antitussives.  IV Lasix 80 mg x 1 given. Empiric IV vancomycin and cefepime.  Blood cultures ordered in the ED.  Check strep antigen.   Active Problems: SIRS with Healthcare associated pneumonia Empiric antibiotics.  Follow blood cultures and strep urine antigen.  Supportive care with Tylenol and nebs as needed.  Acute metabolic acidosis Check ABG, urine electrolytes, UA and urine rapid drug screen.  I will place her on D5 with 3 amp of bicarb.  Repeat bmet.  Consult nephrology if no clinical improvement with fluids.    Aneurysm of thoracic aorta (HCC)    Underwent vascular repair of aortic arch and descending thoracic aorta in July this year.    History of stroke Continue statin.  History of seizures Not on any meds.    CKD (chronic kidney disease) stage 5,  ([place]) Renal function at baseline.  Obtain a repeat lab as BUN much lower than her baseline. Has right upper extremity fistula.  Avoid nephrotoxins.    Tobacco abuse/marijuana use. Counseled on cessation.  Ordered nicotine patch.     Chronic diastolic CHF Has chronically elevated BNP.  Cannot rule out component of acute exacerbation.  Given a dose of IV Lasix in the ED and resume home dose Lasix.  Will hold other blood pressure medications due to low blood pressure in the ED.  Essential hypertension Patient had soft blood pressure in the ED.  Hold all home blood pressure medication except for Lasix.   DVT Prophylaxis subcu heparin  AM Labs Ordered, also please review Full Orders  Family Communication:  Admission, patients condition and plan of care including tests being ordered have been discussed with the patient at bedside  Code Status full code  Likely DC to home  Condition GUARDED   Consults called: renal  Admission status: inpt   Time spent in minutes : 70  Flonnie Overman Kelleigh Skerritt M.D on 11/01/2017 at 1:30 PM  Between 7am to 7pm - Pager - [phone]. After 7pm go to [email or web address] - password [email or web address]  [facility] - Office  [phone]

## 2017-10-30 NOTE — Significant Event (Addendum)
Rapid Response Event Note  Overview:  Called for resp distress and patient coughing up bloody sputum Time Called: 1819 Arrival Time: 9629 Event Type: Respiratory  Initial Focused Assessment: Supine in bed on Bipap - minimally responsive - has frothy pink sputum - bil BS coarse in all fields - RT Khalid present and reports increasing O2 needs since admission to unit.  Dr. Rennis Harding at bedside - requests to intubate patient at the bedside.  Heart, BP and O2 sat monitoring present. See flowsheet for vitals during event.    Interventions:  Prepared for intubation - IV site left jugular site patent - NS hung.  RSI kit from pharmacy.  30 mg Etomidate IV given per order Dr. Rennis Harding.  1st attempt to intubate - patient resisting.  10 mg Etomidate IV given.  Maintaining O2 sats with BVM.  75 mg Succinylcholine IV given per order Dr. Rennis Harding.  Patinet successfully intubated - good color change - bil BS noted. Large amount of frothy pink sputum suctioned from ETT.  Stat transfer to 3M07 with Dr. Rennis Harding - monitors and BVM per RT.  Patient tol well.  Handoff to [employer].    Plan of Care (if not transferred):  Event Summary: Name of Physician Notified: Dr. Clementeen Graham at (PTA RRT)  Name of Consulting Physician Notified: Dr. Rennis Harding at (by attending)  Outcome: Transferred (Comment)  Event End Time: 1930  Quin Hoop

## 2017-10-30 NOTE — ED Notes (Signed)
  Iv team unable to get an iv  Report has been called but she needs an iv before she goes up

## 2017-10-30 NOTE — ED Notes (Signed)
Vancomycin 1250  Not given as  Charted  Just started the vancomycin at 164ml  Unable to get it charted the usual way

## 2017-10-30 NOTE — ED Notes (Signed)
Patient is much calmer on Bipap,

## 2017-10-30 NOTE — Progress Notes (Signed)
RT note: RT transported patient on BIPAP with RN from ED to 2W29. Vital signs stable through out. RT gave report to 2W RT.

## 2017-10-30 NOTE — Progress Notes (Signed)
ETT retracted 4cm per MD.  ETT now at 21 at the lips.

## 2017-10-30 NOTE — Progress Notes (Signed)
Pharmacy Antibiotic Note  Diana Walters is a 62 y.o. female admitted on 10/26/2017 with pneumonia.  Pharmacy has been consulted for Vancomycin dosing. Cefepime ordered x1 dose per MD. Patient has been short of breath x 2 days, diaphoretic and tachypneic. Lungs coarse on MD exam. WBC is elevated at 17.5 and hypothermic in ED (96.2). SCr is elevated at 3.73 (baseline) with CrCl ~ 15 mL/min.   Plan: Vancomycin 1250mg  IV x1, then 1g IV every 48 hours.  Cefepime ordered x1 dose only - follow-up continuation of GN coverage.  Monitor renal function, culture results, and clinical status.   Height: 5\' 10"  (177.8 cm) Weight: 140 lb (63.5 kg) IBW/kg (Calculated) : 68.5  Temp (24hrs), Avg:96.2 F (35.7 C), Min:96.2 F (35.7 C), Max:96.2 F (35.7 C)  Recent Labs  Lab 11/11/2017 1024  WBC 17.5*  CREATININE 3.73*    Estimated Creatinine Clearance: 15.5 mL/min (A) (by C-G formula based on SCr of 3.73 mg/dL (H)).    Allergies  Allergen Reactions  . Ibuprofen Other (See Comments)    Can't take due to medications  PT DOESN'T RECALL THIS ALLERGY  . Codeine Other (See Comments)    Hallucinations  PT DOESN'T RECALL THIS ALLERGY    Antimicrobials this admission: Vancomycin 9/15 >> Cefepime 9/15 x1  Dose adjustments this admission:   Microbiology results: pending  Thank you for allowing pharmacy to be a part of this patient's care.  Sloan Leiter, PharmD, BCPS, BCCCP Clinical Pharmacist Clinical phone 10/21/2017 until 3:30PM [phone] Please refer to [facility] for [place] numbers 11/07/2017 12:09 PM

## 2017-10-30 NOTE — Progress Notes (Signed)
Pt arrived to unit 2W, RT placed pt on BiPaP and RN assessed pt. Pt was stable at time of assessment. Pts O2 sats dropped to 80s, and RN paged RT. BiPaP was gradually increased to 100%. Pts sats 87% on 100% O2, RN paged MD. Pt then coughed up blood tinged sputum. Pts sats started to decrease and rapid response was paged. PCCM arrived with rapid, and a decision was made by the MD to intubate bedside. Pt was intubated and transferred to 56M for care.  Riley Kill, RN

## 2017-10-30 NOTE — Progress Notes (Signed)
ABG drawn, critical results given to CCM NP.

## 2017-10-31 ENCOUNTER — Inpatient Hospital Stay (HOSPITAL_COMMUNITY): Payer: Medicare Other

## 2017-10-31 ENCOUNTER — Other Ambulatory Visit: Payer: Self-pay

## 2017-10-31 DIAGNOSIS — J810 Acute pulmonary edema: Secondary | ICD-10-CM

## 2017-10-31 DIAGNOSIS — N184 Chronic kidney disease, stage 4 (severe): Secondary | ICD-10-CM

## 2017-10-31 DIAGNOSIS — I361 Nonrheumatic tricuspid (valve) insufficiency: Secondary | ICD-10-CM

## 2017-10-31 DIAGNOSIS — J189 Pneumonia, unspecified organism: Secondary | ICD-10-CM

## 2017-10-31 DIAGNOSIS — I959 Hypotension, unspecified: Secondary | ICD-10-CM

## 2017-10-31 DIAGNOSIS — R9431 Abnormal electrocardiogram [ECG] [EKG]: Secondary | ICD-10-CM

## 2017-10-31 DIAGNOSIS — E440 Moderate protein-calorie malnutrition: Secondary | ICD-10-CM

## 2017-10-31 DIAGNOSIS — I5022 Chronic systolic (congestive) heart failure: Secondary | ICD-10-CM

## 2017-10-31 LAB — RENAL FUNCTION PANEL
ALBUMIN: 2.6 g/dL — AB (ref 3.5–5.0)
Albumin: 2.4 g/dL — ABNORMAL LOW (ref 3.5–5.0)
Anion gap: 12 (ref 5–15)
Anion gap: 11 (ref 5–15)
BUN: 51 mg/dL — AB (ref 8–23)
BUN: 30 mg/dL — ABNORMAL HIGH (ref 8–23)
CALCIUM: 7.8 mg/dL — AB (ref 8.9–10.3)
CO2: 17 mmol/L — AB (ref 22–32)
CO2: 21 mmol/L — ABNORMAL LOW (ref 22–32)
CREATININE: 2.35 mg/dL — AB (ref 0.44–1.00)
Calcium: 8 mg/dL — ABNORMAL LOW (ref 8.9–10.3)
Chloride: 114 mmol/L — ABNORMAL HIGH (ref 98–111)
Chloride: 108 mmol/L (ref 98–111)
Creatinine, Ser: 3.78 mg/dL — ABNORMAL HIGH (ref 0.44–1.00)
GFR calc Af Amer: 24 mL/min — ABNORMAL LOW (ref >60)
GFR calc non Af Amer: 21 mL/min — ABNORMAL LOW (ref >60)
GFR, EST AFRICAN AMERICAN: 14 mL/min — AB (ref >60)
GFR, EST NON AFRICAN AMERICAN: 12 mL/min — AB (ref >60)
GLUCOSE: 110 mg/dL — AB (ref 70–99)
Glucose, Bld: 94 mg/dL (ref 70–99)
PHOSPHORUS: 4.9 mg/dL — AB (ref 2.5–4.6)
PHOSPHORUS: 3.4 mg/dL (ref 2.5–4.6)
Potassium: 4.3 mmol/L (ref 3.5–5.1)
Potassium: 5.1 mmol/L (ref 3.5–5.1)
SODIUM: 143 mmol/L (ref 135–145)
SODIUM: 140 mmol/L (ref 135–145)

## 2017-10-31 LAB — BASIC METABOLIC PANEL
Anion gap: 11 (ref 5–15)
BUN: 50 mg/dL — AB (ref 8–23)
CALCIUM: 8 mg/dL — AB (ref 8.9–10.3)
CO2: 17 mmol/L — AB (ref 22–32)
CREATININE: 3.79 mg/dL — AB (ref 0.44–1.00)
Chloride: 115 mmol/L — ABNORMAL HIGH (ref 98–111)
GFR calc Af Amer: 14 mL/min — ABNORMAL LOW (ref >60)
GFR, EST NON AFRICAN AMERICAN: 12 mL/min — AB (ref >60)
GLUCOSE: 96 mg/dL (ref 70–99)
Potassium: 4.3 mmol/L (ref 3.5–5.1)
Sodium: 143 mmol/L (ref 135–145)

## 2017-10-31 LAB — POCT I-STAT 3, ART BLOOD GAS (G3+)
ACID-BASE DEFICIT: 7 mmol/L — AB (ref 0.0–2.0)
ACID-BASE DEFICIT: 3 mmol/L — AB (ref 0.0–2.0)
ACID-BASE DEFICIT: 4 mmol/L — AB (ref 0.0–2.0)
Acid-base deficit: 10 mmol/L — ABNORMAL HIGH (ref 0.0–2.0)
Acid-base deficit: 6 mmol/L — ABNORMAL HIGH (ref 0.0–2.0)
Acid-base deficit: 7 mmol/L — ABNORMAL HIGH (ref 0.0–2.0)
BICARBONATE: 18.5 mmol/L — AB (ref 20.0–28.0)
BICARBONATE: 21.9 mmol/L (ref 20.0–28.0)
BICARBONATE: 22.7 mmol/L (ref 20.0–28.0)
Bicarbonate: 14.9 mmol/L — ABNORMAL LOW (ref 20.0–28.0)
Bicarbonate: 22.4 mmol/L (ref 20.0–28.0)
Bicarbonate: 25.2 mmol/L (ref 20.0–28.0)
O2 SAT: 95 %
O2 Saturation: 89 %
O2 Saturation: 96 %
O2 Saturation: 97 %
O2 Saturation: 98 %
O2 Saturation: 99 %
PCO2 ART: 70.9 mmHg — AB (ref 32.0–48.0)
PCO2 ART: 71.9 mmHg — AB (ref 32.0–48.0)
PH ART: 7.108 — AB (ref 7.350–7.450)
PO2 ART: 121 mmHg — AB (ref 83.0–108.0)
PO2 ART: 121 mmHg — AB (ref 83.0–108.0)
PO2 ART: 75 mmHg — AB (ref 83.0–108.0)
Patient temperature: 98.6
Patient temperature: 99
Patient temperature: 98.4
TCO2: 16 mmol/L — ABNORMAL LOW (ref 22–32)
TCO2: 20 mmol/L — AB (ref 22–32)
TCO2: 23 mmol/L (ref 22–32)
TCO2: 25 mmol/L (ref 22–32)
TCO2: 25 mmol/L (ref 22–32)
TCO2: 28 mmol/L (ref 22–32)
pCO2 arterial: 28.2 mmHg — ABNORMAL LOW (ref 32.0–48.0)
pCO2 arterial: 36.6 mmHg (ref 32.0–48.0)
pCO2 arterial: 37.4 mmHg (ref 32.0–48.0)
pCO2 arterial: 79.7 mmHg (ref 32.0–48.0)
pH, Arterial: 7.102 — CL (ref 7.350–7.450)
pH, Arterial: 7.115 — CL (ref 7.350–7.450)
pH, Arterial: 7.312 — ABNORMAL LOW (ref 7.350–7.450)
pH, Arterial: 7.329 — ABNORMAL LOW (ref 7.350–7.450)
pH, Arterial: 7.375 (ref 7.350–7.450)
pO2, Arterial: 113 mmHg — ABNORMAL HIGH (ref 83.0–108.0)
pO2, Arterial: 78 mmHg — ABNORMAL LOW (ref 83.0–108.0)
pO2, Arterial: 110 mmHg — ABNORMAL HIGH (ref 83.0–108.0)

## 2017-10-31 LAB — GLUCOSE, CAPILLARY
GLUCOSE-CAPILLARY: 111 mg/dL — AB (ref 70–99)
GLUCOSE-CAPILLARY: 85 mg/dL (ref 70–99)
Glucose-Capillary: 103 mg/dL — ABNORMAL HIGH (ref 70–99)
Glucose-Capillary: 109 mg/dL — ABNORMAL HIGH (ref 70–99)
Glucose-Capillary: 105 mg/dL — ABNORMAL HIGH (ref 70–99)
Glucose-Capillary: 109 mg/dL — ABNORMAL HIGH (ref 70–99)

## 2017-10-31 LAB — RESPIRATORY PANEL BY PCR
ADENOVIRUS-RVPPCR: NOT DETECTED
BORDETELLA PERTUSSIS-RVPCR: NOT DETECTED
CORONAVIRUS 229E-RVPPCR: NOT DETECTED
CORONAVIRUS HKU1-RVPPCR: NOT DETECTED
CORONAVIRUS NL63-RVPPCR: NOT DETECTED
Chlamydophila pneumoniae: NOT DETECTED
Coronavirus OC43: NOT DETECTED
INFLUENZA B-RVPPCR: NOT DETECTED
Influenza A: NOT DETECTED
Metapneumovirus: NOT DETECTED
Mycoplasma pneumoniae: NOT DETECTED
PARAINFLUENZA VIRUS 1-RVPPCR: NOT DETECTED
PARAINFLUENZA VIRUS 2-RVPPCR: NOT DETECTED
PARAINFLUENZA VIRUS 3-RVPPCR: NOT DETECTED
Parainfluenza Virus 4: NOT DETECTED
Respiratory Syncytial Virus: NOT DETECTED
Rhinovirus / Enterovirus: NOT DETECTED

## 2017-10-31 LAB — CBC
HEMATOCRIT: 25.9 % — AB (ref 36.0–46.0)
Hemoglobin: 8.3 g/dL — ABNORMAL LOW (ref 12.0–15.0)
MCH: 26.3 pg (ref 26.0–34.0)
MCHC: 32 g/dL (ref 30.0–36.0)
MCV: 82.2 fL (ref 78.0–100.0)
PLATELETS: 269 10*3/uL (ref 150–400)
RBC: 3.15 MIL/uL — ABNORMAL LOW (ref 3.87–5.11)
RDW: 22.5 % — ABNORMAL HIGH (ref 11.5–15.5)
WBC: 11.3 10*3/uL — ABNORMAL HIGH (ref 4.0–10.5)

## 2017-10-31 LAB — POCT ACTIVATED CLOTTING TIME
ACTIVATED CLOTTING TIME: 158 s
ACTIVATED CLOTTING TIME: 169 s
Activated Clotting Time: 164 seconds
Activated Clotting Time: 158 seconds
Activated Clotting Time: 164 seconds

## 2017-10-31 LAB — MAGNESIUM
MAGNESIUM: 2.4 mg/dL (ref 1.7–2.4)
Magnesium: 2.2 mg/dL (ref 1.7–2.4)
Magnesium: 2.2 mg/dL (ref 1.7–2.4)

## 2017-10-31 LAB — TROPONIN I
Troponin I: 0.94 ng/mL (ref <0.03)
Troponin I: 1.24 ng/mL (ref <0.03)
Troponin I: 1.22 ng/mL (ref <0.03)

## 2017-10-31 LAB — ECHOCARDIOGRAM COMPLETE
HEIGHTINCHES: 69 in
Weight: 2246.93 oz

## 2017-10-31 LAB — POCT I-STAT, CHEM 8
BUN: 51 mg/dL — AB (ref 8–23)
Calcium, Ion: 1.18 mmol/L (ref 1.15–1.40)
Chloride: 116 mmol/L — ABNORMAL HIGH (ref 98–111)
Creatinine, Ser: 4.3 mg/dL — ABNORMAL HIGH (ref 0.44–1.00)
Glucose, Bld: 114 mg/dL — ABNORMAL HIGH (ref 70–99)
HEMATOCRIT: 28 % — AB (ref 36.0–46.0)
HEMOGLOBIN: 9.5 g/dL — AB (ref 12.0–15.0)
POTASSIUM: 4.9 mmol/L (ref 3.5–5.1)
Sodium: 144 mmol/L (ref 135–145)
TCO2: 17 mmol/L — ABNORMAL LOW (ref 22–32)

## 2017-10-31 LAB — LACTIC ACID, PLASMA
Lactic Acid, Venous: 2.7 mmol/L (ref 0.5–1.9)
Lactic Acid, Venous: 3.2 mmol/L (ref 0.5–1.9)

## 2017-10-31 LAB — STREP PNEUMONIAE URINARY ANTIGEN: Strep Pneumo Urinary Antigen: NEGATIVE

## 2017-10-31 LAB — PROCALCITONIN: Procalcitonin: 30.69 ng/mL

## 2017-10-31 MED ORDER — SODIUM BICARBONATE 8.4 % IV SOLN: 100.0000 meq | Freq: Once | INTRAVENOUS | Status: DC

## 2017-10-31 MED ORDER — ORAL CARE MOUTH RINSE
15.0000 mL | OROMUCOSAL | Status: DC
  Administered 2017-10-31 – 2017-11-05 (×54): 15 mL via OROMUCOSAL

## 2017-10-31 MED ORDER — CHLORHEXIDINE GLUCONATE CLOTH 2 % EX PADS
6.0000 | MEDICATED_PAD | Freq: Every morning | CUTANEOUS | Status: DC
  Administered 2017-10-31 – 2017-11-05 (×6): 6 via TOPICAL

## 2017-10-31 MED ORDER — ARTIFICIAL TEARS OPHTHALMIC OINT
1.0000 "application " | TOPICAL_OINTMENT | Freq: Three times a day (TID) | OPHTHALMIC | Status: DC
  Administered 2017-10-31 – 2017-11-02 (×5): 1 via OPHTHALMIC
  Filled 2017-10-31: qty 3.5

## 2017-10-31 MED ORDER — MIDAZOLAM BOLUS VIA INFUSION
1.0000 mg | INTRAVENOUS | Status: DC | PRN
  Administered 2017-10-31 (×2): 2 mg via INTRAVENOUS
  Filled 2017-10-31: qty 2

## 2017-10-31 MED ORDER — CISATRACURIUM BOLUS VIA INFUSION
10.0000 mg | Freq: Once | INTRAVENOUS | Status: AC
  Administered 2017-10-31: 10 mg via INTRAVENOUS
  Filled 2017-10-31: qty 10

## 2017-10-31 MED ORDER — VITAL HIGH PROTEIN PO LIQD: 1000.0000 mL | ORAL | Status: DC

## 2017-10-31 MED ORDER — PRISMASOL BGK 4/2.5 32-4-2.5 MEQ/L IV SOLN: INTRAVENOUS | Status: DC

## 2017-10-31 MED ORDER — PRISMASOL BGK 4/2.5 32-4-2.5 MEQ/L IV SOLN
INTRAVENOUS | Status: DC
  Administered 2017-10-31 – 2017-11-05 (×27): via INTRAVENOUS_CENTRAL
  Filled 2017-10-31 (×50): qty 5000

## 2017-10-31 MED ORDER — PIVOT 1.5 CAL PO LIQD
1000.0000 mL | ORAL | Status: DC
  Administered 2017-10-31: 1000 mL
  Filled 2017-10-31 (×3): qty 1000

## 2017-10-31 MED ORDER — NOREPINEPHRINE 16 MG/250ML-% IV SOLN
0.0000 ug/min | INTRAVENOUS | Status: DC
  Administered 2017-10-31: 0.4 ug/min via INTRAVENOUS
  Administered 2017-10-31: 5 ug/min via INTRAVENOUS
  Administered 2017-10-31: 40 ug/min via INTRAVENOUS
  Administered 2017-11-01: 10 ug/min via INTRAVENOUS
  Administered 2017-11-03: 5 ug/min via INTRAVENOUS
  Administered 2017-11-04: 2 ug/min via INTRAVENOUS
  Filled 2017-10-31 (×2): qty 250

## 2017-10-31 MED ORDER — ATORVASTATIN CALCIUM 40 MG PO TABS
40.0000 mg | ORAL_TABLET | Freq: Every day | ORAL | Status: DC
  Administered 2017-10-31 – 2017-11-05 (×6): 40 mg
  Filled 2017-10-31 (×6): qty 1

## 2017-10-31 MED ORDER — FENTANYL CITRATE (PF) 100 MCG/2ML IJ SOLN
25.0000 ug | INTRAMUSCULAR | Status: DC | PRN
  Administered 2017-10-31 (×2): 50 ug via INTRAVENOUS
  Filled 2017-10-31: qty 2

## 2017-10-31 MED ORDER — HEPARIN SODIUM (PORCINE) 1000 UNIT/ML DIALYSIS
1000.0000 [IU] | INTRAMUSCULAR | Status: DC | PRN
  Filled 2017-10-31: qty 6

## 2017-10-31 MED ORDER — B COMPLEX-C PO TABS
1.0000 | ORAL_TABLET | Freq: Every day | ORAL | Status: DC
  Administered 2017-10-31 – 2017-11-05 (×6): 1
  Filled 2017-10-31 (×6): qty 1

## 2017-10-31 MED ORDER — SODIUM CHLORIDE 0.9 % IV SOLN
INTRAVENOUS | Status: DC | PRN
  Administered 2017-10-31: 05:00:00 via INTRA_ARTERIAL
  Administered 2017-11-04: 10 mL/h via INTRA_ARTERIAL

## 2017-10-31 MED ORDER — HEPARIN BOLUS VIA INFUSION (CRRT)
1000.0000 [IU] | INTRAVENOUS | Status: DC | PRN
  Filled 2017-10-31: qty 1000

## 2017-10-31 MED ORDER — FENTANYL 2500MCG IN NS 250ML (10MCG/ML) PREMIX INFUSION
100.0000 ug/h | INTRAVENOUS | Status: DC
  Administered 2017-11-01: 200 ug/h via INTRAVENOUS
  Filled 2017-10-31 (×2): qty 250

## 2017-10-31 MED ORDER — VASOPRESSIN 20 UNIT/ML IV SOLN
0.0300 [IU]/min | INTRAVENOUS | Status: DC
  Administered 2017-10-31: 0.03 [IU]/min via INTRAVENOUS
  Filled 2017-10-31: qty 2

## 2017-10-31 MED ORDER — SODIUM CHLORIDE 0.9 % IV SOLN
2.0000 g | Freq: Two times a day (BID) | INTRAVENOUS | Status: DC
  Administered 2017-10-31 – 2017-11-05 (×11): 2 g via INTRAVENOUS
  Filled 2017-10-31 (×11): qty 2

## 2017-10-31 MED ORDER — SODIUM CHLORIDE 0.9 % IV SOLN
0.0000 mg/h | INTRAVENOUS | Status: DC
  Administered 2017-10-31: 2 mg/h via INTRAVENOUS
  Administered 2017-10-31: 5 mg/h via INTRAVENOUS
  Filled 2017-10-31 (×3): qty 10

## 2017-10-31 MED ORDER — ALBUMIN HUMAN 25 % IV SOLN
50.0000 g | Freq: Once | INTRAVENOUS | Status: AC
  Administered 2017-10-31: 50 g via INTRAVENOUS

## 2017-10-31 MED ORDER — MIDAZOLAM HCL 2 MG/2ML IJ SOLN: 2.0000 mg | Freq: Once | INTRAMUSCULAR | Status: DC | PRN

## 2017-10-31 MED ORDER — MIDAZOLAM HCL 2 MG/2ML IJ SOLN
INTRAMUSCULAR | Status: AC
  Administered 2017-10-31: 2 mg
  Filled 2017-10-31: qty 2

## 2017-10-31 MED ORDER — ATROPINE SULFATE 1 MG/10ML IJ SOSY
PREFILLED_SYRINGE | INTRAMUSCULAR | Status: AC
  Filled 2017-10-31: qty 10

## 2017-10-31 MED ORDER — PRISMASOL BGK 4/2.5 32-4-2.5 MEQ/L IV SOLN
INTRAVENOUS | Status: DC
  Administered 2017-10-31 (×4): via INTRAVENOUS_CENTRAL
  Filled 2017-10-31 (×7): qty 5000

## 2017-10-31 MED ORDER — SODIUM BICARBONATE 8.4 % IV SOLN
200.0000 meq | Freq: Once | INTRAVENOUS | Status: AC
  Administered 2017-11-01: 200 meq via INTRAVENOUS
  Filled 2017-10-31: qty 200

## 2017-10-31 MED ORDER — FENTANYL CITRATE (PF) 100 MCG/2ML IJ SOLN: 100.0000 ug | Freq: Once | INTRAMUSCULAR | Status: DC | PRN

## 2017-10-31 MED ORDER — SODIUM CHLORIDE 0.9 % FOR CRRT
INTRAVENOUS_CENTRAL | Status: DC | PRN
  Administered 2017-10-31 – 2017-11-05 (×2): via INTRAVENOUS_CENTRAL
  Filled 2017-10-31: qty 1000

## 2017-10-31 MED ORDER — FENTANYL BOLUS VIA INFUSION
50.0000 ug | INTRAVENOUS | Status: DC | PRN
  Filled 2017-10-31: qty 50

## 2017-10-31 MED ORDER — FENTANYL 2500MCG IN NS 250ML (10MCG/ML) PREMIX INFUSION
0.0000 ug/h | INTRAVENOUS | Status: DC
  Administered 2017-10-31: 25 ug/h via INTRAVENOUS
  Filled 2017-10-31: qty 250

## 2017-10-31 MED ORDER — CHLORHEXIDINE GLUCONATE 0.12% ORAL RINSE (MEDLINE KIT)
15.0000 mL | Freq: Two times a day (BID) | OROMUCOSAL | Status: DC
  Administered 2017-10-31 – 2017-11-05 (×12): 15 mL via OROMUCOSAL

## 2017-10-31 MED ORDER — PRISMASOL BGK 4/2.5 32-4-2.5 MEQ/L IV SOLN
INTRAVENOUS | Status: DC
  Administered 2017-10-31 – 2017-11-05 (×14): via INTRAVENOUS_CENTRAL
  Filled 2017-10-31 (×14): qty 5000

## 2017-10-31 MED ORDER — SODIUM CHLORIDE 0.9 % IV SOLN
2.0000 mg/h | INTRAVENOUS | Status: DC
  Administered 2017-11-01 (×2): 10 mg/h via INTRAVENOUS
  Filled 2017-10-31 (×3): qty 10
  Filled 2017-10-31: qty 20

## 2017-10-31 MED ORDER — FENTANYL CITRATE (PF) 100 MCG/2ML IJ SOLN
100.0000 ug | Freq: Once | INTRAMUSCULAR | Status: AC
  Administered 2017-10-31: 100 ug via INTRAVENOUS

## 2017-10-31 MED ORDER — HEPARIN (PORCINE) 2000 UNITS/L FOR CRRT
INTRAVENOUS_CENTRAL | Status: DC | PRN
  Administered 2017-11-04: 03:00:00 via INTRAVENOUS_CENTRAL
  Filled 2017-10-31: qty 1000

## 2017-10-31 MED ORDER — FUROSEMIDE 10 MG/ML IJ SOLN
160.0000 mg | Freq: Once | INTRAVENOUS | Status: AC
  Administered 2017-10-31: 160 mg via INTRAVENOUS
  Filled 2017-10-31: qty 16

## 2017-10-31 MED ORDER — MIDAZOLAM BOLUS VIA INFUSION
2.0000 mg | INTRAVENOUS | Status: DC | PRN
  Filled 2017-10-31: qty 2

## 2017-10-31 MED ORDER — VANCOMYCIN HCL IN DEXTROSE 750-5 MG/150ML-% IV SOLN
750.0000 mg | INTRAVENOUS | Status: DC
  Administered 2017-10-31: 750 mg via INTRAVENOUS
  Filled 2017-10-31 (×2): qty 150

## 2017-10-31 MED ORDER — FAMOTIDINE 10 MG PO TABS
10.0000 mg | ORAL_TABLET | Freq: Every day | ORAL | Status: DC
  Administered 2017-10-31 – 2017-11-01 (×2): 10 mg
  Filled 2017-10-31 (×2): qty 1

## 2017-10-31 MED ORDER — SODIUM CHLORIDE 0.9 % IV SOLN
3.0000 ug/kg/min | INTRAVENOUS | Status: DC
  Administered 2017-10-31: 3 ug/kg/min via INTRAVENOUS
  Filled 2017-10-31: qty 20

## 2017-10-31 MED ORDER — PRISMASOL BGK 0/2.5 32-2.5 MEQ/L IV SOLN
INTRAVENOUS | Status: DC
  Administered 2017-10-31 – 2017-11-05 (×22): via INTRAVENOUS_CENTRAL
  Filled 2017-10-31 (×35): qty 5000

## 2017-10-31 MED ORDER — DOPAMINE-DEXTROSE 3.2-5 MG/ML-% IV SOLN
0.0000 ug/kg/min | INTRAVENOUS | Status: DC
  Administered 2017-10-31: 5 ug/kg/min via INTRAVENOUS
  Filled 2017-10-31: qty 250

## 2017-10-31 MED ORDER — ASPIRIN 300 MG RE SUPP
300.0000 mg | Freq: Every day | RECTAL | Status: DC
  Administered 2017-10-31: 300 mg via RECTAL
  Filled 2017-10-31: qty 1

## 2017-10-31 MED ORDER — FENTANYL CITRATE (PF) 100 MCG/2ML IJ SOLN: 25.0000 ug | INTRAMUSCULAR | Status: DC | PRN

## 2017-10-31 MED ORDER — EPINEPHRINE PF 1 MG/ML IJ SOLN
0.5000 ug/min | INTRAVENOUS | Status: DC
  Administered 2017-10-31: 5 ug/min via INTRAVENOUS
  Filled 2017-10-31: qty 4

## 2017-10-31 MED ORDER — SODIUM CHLORIDE 0.9 % IJ SOLN
250.0000 [IU]/h | INTRAMUSCULAR | Status: DC
  Administered 2017-10-31: 250 [IU]/h via INTRAVENOUS_CENTRAL
  Administered 2017-11-01: 1200 [IU]/h via INTRAVENOUS_CENTRAL
  Administered 2017-11-02: 1100 [IU]/h via INTRAVENOUS_CENTRAL
  Administered 2017-11-02: 1150 [IU]/h via INTRAVENOUS_CENTRAL
  Administered 2017-11-03 (×3): 1100 [IU]/h via INTRAVENOUS_CENTRAL
  Administered 2017-11-04 (×2): 750 [IU]/h via INTRAVENOUS_CENTRAL
  Administered 2017-11-05: 700 [IU]/h via INTRAVENOUS_CENTRAL
  Filled 2017-10-31 (×17): qty 2

## 2017-10-31 MED ORDER — VITAL AF 1.2 CAL PO LIQD
1500.0000 mL | ORAL | Status: DC
  Administered 2017-10-31: 1500 mL
  Filled 2017-10-31: qty 1500

## 2017-10-31 MED ORDER — MIDAZOLAM HCL 2 MG/2ML IJ SOLN
2.0000 mg | Freq: Once | INTRAMUSCULAR | Status: AC
  Administered 2017-10-31: 2 mg via INTRAVENOUS

## 2017-10-31 MED ORDER — MIDAZOLAM HCL 2 MG/2ML IJ SOLN
2.0000 mg | Freq: Once | INTRAMUSCULAR | Status: AC
  Administered 2017-10-30: 2 mg via INTRAVENOUS

## 2017-10-31 MED ORDER — NOREPINEPHRINE 4 MG/250ML-% IV SOLN
0.0000 ug/min | INTRAVENOUS | Status: DC
  Administered 2017-10-31: 5 ug/min via INTRAVENOUS
  Filled 2017-10-31: qty 250

## 2017-10-31 MED ORDER — PHENYLEPHRINE HCL-NACL 10-0.9 MG/250ML-% IV SOLN
0.0000 ug/min | INTRAVENOUS | Status: DC
  Filled 2017-10-31: qty 250

## 2017-10-31 NOTE — Care Management Note (Signed)
Case Management Note  Patient Details  Name: Diana Walters MRN: [id number] Date of Birth: [birth year]  Subjective/Objective:     Acute Hypoxic Respiratory Distress in setting of cardiogenic pulmonary edema secondary to decompensated heart failure             Action/Plan: Patient remains in the ICU on vent support; CRRT, Bradycardiac; PCP is   Renato Shin, MD; has private insurance with Medicare; CM will continue to follow for progression of care Expected Discharge Date:     undetermined             Expected Discharge Plan:   undetermined, pt remains critical  Status of Service:   In progress  Sherrilyn Rist [phone] 10/31/2017, 11:31 AM

## 2017-10-31 NOTE — Progress Notes (Addendum)
Pharmacy Antibiotic Note  Diana Walters is a 62 y.o. female admitted on 10/19/2017 with pneumonia.  Pharmacy has been consulted for Vancomycin and Cefepime dosing.  SCr is elevated at 4.30.  Scr 3.7 on admit 9/15) with CrCl ~ 13 mL/min.   CRRT started at 04:30 10/31/17- will need to adjust antibiotic dosage for CRRT.  Plan: Cefepime 2g IV q12h Vancomycin 750 mg q24h Monitor renal function, culture results, and clinical status.   Vancomycin trough level at steady state  Height: 5\' 9"  (175.3 cm) Weight: 140 lb 6.9 oz (63.7 kg) IBW/kg (Calculated) : 66.2  Temp (24hrs), Avg:97.2 F (36.2 C), Min:96.2 F (35.7 C), Max:97.8 F (36.6 C)  Recent Labs  Lab 10/21/2017 1024 11/13/2017 1615 10/24/2017 1907 10/17/2017 1948 10/29/2017 2048 11/04/2017 2248 10/31/17 0025  WBC 17.5*  --  21.6*  --   --   --   --   CREATININE 3.73* 3.93* 4.05*  --  3.98*  --  4.30*  LATICACIDVEN  --   --   --  2.1*  --  2.7*  --     Estimated Creatinine Clearance: 13.5 mL/min (A) (by C-G formula based on SCr of 4.3 mg/dL (H)).    Allergies  Allergen Reactions  . Ibuprofen Other (See Comments)    Can't take due to medications  PT DOESN'T RECALL THIS ALLERGY  . Codeine Other (See Comments)    Hallucinations  PT DOESN'T RECALL THIS ALLERGY    Antimicrobials this admission: Vancomycin 9/15 >> Cefepime 9/15>>  Dose adjustments this admission:  CRRT started 9/16, adjusted antibiotic doses:  Cefepime 2g IV q12h Vancomycin 750 mg q24h  Microbiology results: 9/15 BCx x1: sent 9/15 BCx x1 : sent 9/15 Resp trach asp Cx: sent 9/15 MRSA PCR: negative 9/16 Resp panel: sent   Thank you for allowing pharmacy to be a part of this patient's care. Nicole Cella, RPh Clinical Pharmacist Clinical phone 10/31/2017 until 3:30PM [phone] Please refer to [facility] for [place] numbers 10/31/2017 4:28 AM

## 2017-10-31 NOTE — Progress Notes (Signed)
Initial Nutrition Assessment  DOCUMENTATION CODES:   Non-severe (moderate) malnutrition in context of chronic illness  INTERVENTION:   Tube Feeding:  Vital AF 1.2 @ 55 ml/hr Provides 99 g of protein, 1584 kcals, 1069 mL of free water Meets 100% of protein needs, 97% calorie needs  Add B-complex with Vitamin C for repletion while on CRRT    NUTRITION DIAGNOSIS:   Moderate Malnutrition related to chronic illness(CKD IV with new right arm fistula, COPD, CHF) as evidenced by mild fat depletion, severe muscle depletion, edema.  GOAL:   Patient will meet greater than or equal to 90% of their needs  MONITOR:   TF tolerance, Vent status, Labs, Weight trends  REASON FOR ASSESSMENT:   Ventilator    ASSESSMENT:    62 yo female admitted with acute reapiratory failure with acute pulmonary edema requiring intubation, AKI on CKD started on CVVHD. PMH includes CVA, HLD, HTN, COPD, RA, CKD IV with new right arm fistula, CHF   9/15 Admit, Intubated 9/16 CRRT initiated  Patient is currently intubated on ventilator support, fentanyl/versed for sedation, dopamine and levophed for pressors CRRT with UF 100 ml/hr Warming Blanket for hypothermia MV: 14.1 L/min Temp (24hrs), Avg:97.3 F (36.3 C), Min:94.5 F (34.7 C), Max:98.8 F (37.1 C)  Based on current information available, pt meets criteria for moderate malnutrition in context of chronic illness. It is possible that once better weight and diet history obtained, that pt may actually meet severe malnutrition  Labs: phosphorus 4.9, potassium wdl Meds: lasix drip, fentanyl, versed, sodium bicarb @ 75   NUTRITION - FOCUSED PHYSICAL EXAM:    Most Recent Value  Orbital Region  No depletion  Upper Arm Region  Mild depletion  Thoracic and Lumbar Region  Mild depletion  Buccal Region  Unable to assess  Temple Region  Severe depletion  Clavicle Bone Region  Severe depletion  Clavicle and Acromion Bone Region  Severe depletion   Scapular Bone Region  Moderate depletion  Dorsal Hand  Unable to assess  Patellar Region  Severe depletion  Anterior Thigh Region  Severe depletion  Posterior Calf Region  Severe depletion  Edema (RD Assessment)  Mild       Diet Order:   Diet Order            Diet NPO time specified  Diet effective now              EDUCATION NEEDS:   Not appropriate for education at this time  Skin:  Skin Assessment: Reviewed RN Assessment  Last BM:  9/16  Height:   Ht Readings from Last 1 Encounters:  11/09/2017 5\' 9"  (1.753 m)    Weight:   Wt Readings from Last 1 Encounters:  10/31/17 63.7 kg    Ideal Body Weight:     BMI:  Body mass index is 20.74 kg/m.  Estimated Nutritional Needs:   Kcal:  3267 kcals   Protein:  96-127 g  Fluid:  >/= 1.2 L   Kerman Passey MS, RD, LDN, CNSC [phone] Pager  [phone] Weekend/On-Call Pager

## 2017-10-31 NOTE — Progress Notes (Signed)
[facility] Progress Note Patient Name: Diana Walters DOB: 09-Jul-1955 MRN: [id number]   Date of Service  10/31/2017  HPI/Events of Note  ABG on 50%/PRVC 35/TV 350/P 10 = 7.102/71.9/121.0. Ventilator rate  pretty much maxed out. Increase TV limited by high Ppeak and Pplat.   eICU Interventions  Will order: 1. NaHCO3 200 meq IV now. 2. Increase NaHCO3 IV infusion to 100 mL/hour. 3. ABG at 12 midnight.      Intervention Category Major Interventions: Acid-Base disturbance - evaluation and management;Respiratory failure - evaluation and management  Akon Reinoso Cornelia Copa 10/31/2017, 10:18 PM

## 2017-10-31 NOTE — Progress Notes (Signed)
[facility]  Patient Status: Active (receiving services up to time of hospitalization)  [facility] is providing the following services: RN and PT  If patient discharges after hours, please call [phone].   Diana Walters 10/31/2017, 10:23 AM

## 2017-10-31 NOTE — Procedures (Addendum)
Arterial Catheter Insertion Procedure Note Diana Walters [id number] 27-Apr-1955  Procedure: Insertion of Arterial Catheter  Indications: Blood pressure monitoring  Procedure Details Consent: Risks of procedure as well as the alternatives and risks of each were explained to the (patient/caregiver).  Consent for procedure obtained. Time Out: Verified patient identification, verified procedure, site/side was marked, verified correct patient position, special equipment/implants available, medications/allergies/relevent history reviewed, required imaging and test results available.  Performed  Maximum sterile technique was used including antiseptics, cap, gloves, gown, hand hygiene, mask and sheet. Skin prep: Chlorhexidine; local anesthetic administered 20 gauge catheter was inserted into right femoral artery using the Seldinger technique. ULTRASOUND GUIDANCE USED: NO Evaluation Blood flow good; BP tracing good. Complications: No apparent complications.   Diana Walters Diana Walters 10/31/2017

## 2017-10-31 NOTE — Significant Event (Signed)
Critical Care Attending.  Called for ongoing hypotension.   SBP 60's but strong femoral pulses and patient warm to touch.  50g of albumin given  Insertion of Right femoral line: Patient is hypertensive!  I titrated the patient off all vasoactives.  Still uncomfortable with respiratory distress.   Increase sedation and start CRRT.  Norepinephrine if hypotension recurs.  ABG and lactate to assess perfusion status..  CRITICAL CARE Performed by: Kipp Brood   Total critical care time: 30 minutes  Critical care time was exclusive of separately billable procedures and treating other patients.  Critical care was necessary to treat or prevent imminent or life-threatening deterioration.  Critical care was time spent personally by me on the following activities: development of treatment plan with patient and/or surrogate as well as nursing, discussions with consultants, evaluation of patient's response to treatment, examination of patient, obtaining history from patient or surrogate, ordering and performing treatments and interventions, ordering and review of laboratory studies, ordering and review of radiographic studies, pulse oximetry and re-evaluation of patient's condition.

## 2017-10-31 NOTE — Procedures (Signed)
Arterial Catheter Insertion Procedure Note Diana Walters [id number] 1955-07-08  Procedure: Insertion of Arterial Catheter  Indications: Blood pressure monitoring  Procedure Details Consent: Unable to obtain consent because of emergent medical necessity. Time Out: Verified patient identification, verified procedure, site/side was marked, verified correct patient position, special equipment/implants available, medications/allergies/relevent history reviewed, required imaging and test results available.  Performed  Maximum sterile technique was used including antiseptics, cap, gloves, gown, hand hygiene, mask and sheet. Skin prep: Chlorhexidine; local anesthetic administered 20 gauge catheter was inserted into left radial artery using the Seldinger technique. ULTRASOUND GUIDANCE USED: NO Evaluation Blood flow good; BP tracing good. Complications: No apparent complications.   Clance Boll 10/31/2017

## 2017-10-31 NOTE — Consult Note (Signed)
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    Other past history includes history of CVA, anemia, rheumatoid arthritis, diastolic heart failure,.   Chest x-ray with extensive right greater than left diffuse airspace disease either reflecting pneumonia or edema.  SCr now 4.3, near her baseline at time of DC 09/22/17.  K 4.9.  HCO3 17.  ABG just after intubation 7.11 / 47 / 69, Most recent 7.32 / 28 / 121.   Req 100% FIO2.  BP is soft 70-100s SBP.  Not on pressors.  Is rec 160 IV lasix at this time.  Minimal UOP thus far.   Started on Vanc/Cefepime   Creat (mg/dL)  Date Value  06/10/2014 1.10   Creatinine, Ser (mg/dL)  Date Value  10/31/2017 4.30 (H)  10/27/2017 3.98 (H)  11/14/2017 4.05 (H)  10/22/2017 3.93 (H)  10/23/2017 3.73 (H)  09/23/2017 4.68 (H)  09/22/2017 4.61 (H)  09/21/2017 4.61 (H)  09/20/2017 4.57 (H)  09/19/2017 4.35 (H)  ]  ROS unable to assess  PMH  Past Medical History:  Diagnosis Date  . ANEMIA 10/17/2009  . ANXIETY 10/28/2006  . Arthritis   . ASTHMA 10/28/2006   no inhalers per pt  . Blood transfusion without reported diagnosis   . Depression   . Duodenitis without mention of hemorrhage 2005  . Heart murmur   . HYPERCHOLESTEROLEMIA 04/01/2008  . HYPERTENSION 10/28/2006  . Menopause    age 62  . Osteoporosis    bil knees  . SEIZURE DISORDER 04/01/2008   past- medications caused it   . Stroke ([place]) 11/2012  . 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 AV FISTULA PLACEMENT Right 09/22/2017   Procedure: ARTERIOVENOUS (AV) FISTULA CREATION;  Surgeon: Waynetta Sandy, MD;  Location: [place];  Service: Vascular;  Laterality: Right;  . ESOPHAGOGASTRODUODENOSCOPY  01/29/2004  . 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 LOOP RECORDER IMPLANT N/A 12/01/2012   Procedure: LOOP RECORDER IMPLANT;  Surgeon: Coralyn Mark, MD;  Location: [place] CATH LAB;  Service: Cardiovascular;  Laterality: N/A;  . TEE WITHOUT CARDIOVERSION N/A 12/01/2012   Procedure: TRANSESOPHAGEAL ECHOCARDIOGRAM (TEE);  Surgeon: Fay Records, MD;  Location: [facility] ENDOSCOPY;  Service: Cardiovascular;  Laterality: N/A;  . THORACIC AORTIC ENDOVASCULAR STENT GRAFT N/A 09/08/2017   Procedure: THORACIC AORTIC ENDOVASCULAR STENT GRAFT;  Surgeon: Waynetta Sandy, MD;  Location: [facility] OR;  Service: Vascular;  Laterality: N/A;  . VASCULAR SURGERY  8-9 years ago per patient   thoracic aortic anyuresm repair with patch    FH  Family History  Problem Relation Age of Onset  . Breast cancer Mother   . Heart disease Mother        before age 93  . Colon cancer Neg Hx   . Esophageal cancer Neg Hx   . Pancreatic cancer Neg Hx   . Rectal cancer Neg Hx   . Stomach cancer Neg Hx    SH  reports that she has been smoking cigarettes. She has a 3.00 pack-year smoking history. She has never used  smokeless tobacco. She reports that she drank alcohol. She reports that she has current or past drug history. Drug: Marijuana. Allergies  Allergies  Allergen Reactions  . Ibuprofen Other (See Comments)    Can't take due to medications  PT DOESN'T RECALL THIS ALLERGY  . Codeine Other (See Comments)    Hallucinations  PT DOESN'T RECALL THIS ALLERGY   Home medications Prior to Admission medications   Medication Sig Start Date End Date Taking? Authorizing Provider  acetaminophen (TYLENOL) 325 MG tablet Take 650 mg by mouth every 6 (six) hours as needed for moderate pain.    Yes [provider]  amLODipine (NORVASC) 5 MG tablet Take 1  tablet (5 mg total) by mouth daily. 07/05/17 10/21/2017 Yes Daune Perch, NP  atorvastatin (LIPITOR) 40 MG tablet Take 1 tablet (40 mg total) by mouth daily. 06/22/17  Yes Daune Perch, NP  famotidine (PEPCID) 20 MG tablet Take 1 tablet (20 mg total) by mouth daily. 09/23/17  Yes Hosie Poisson, MD  furosemide (LASIX) 80 MG tablet Take 2 tablets (160 mg total) by mouth daily. 09/23/17  Yes Hosie Poisson, MD  hydrALAZINE (APRESOLINE) 50 MG tablet Take 1.5 tablets (75 mg total) by mouth 3 (three) times daily. 06/22/17  Yes Daune Perch, NP  isosorbide mononitrate (IMDUR) 60 MG 24 hr tablet Take 1 tablet (60 mg total) by mouth daily. 06/22/17  Yes Daune Perch, NP  Menthol-Camphor (ICY HOT ADVANCED RELIEF) 16-11 % CREA Apply 1 application topically daily as needed (knee pain).   Yes [provider]  metoprolol tartrate (LOPRESSOR) 25 MG tablet Take 0.5 tablets (12.5 mg total) by mouth daily. 09/23/17  Yes Hosie Poisson, MD  Multiple Vitamin (MULTIVITAMIN) tablet Take 1 tablet by mouth daily.     Yes [provider]  Nutritional Supplements (FEEDING SUPPLEMENT, NEPRO CARB STEADY,) LIQD Take 237 mLs by mouth at bedtime. 09/23/17  Yes Hosie Poisson, MD  oxyCODONE-acetaminophen (PERCOCET/ROXICET) 5-325 MG tablet Take 1-2 tablets by mouth every 4 (four) hours as needed for moderate pain. 09/23/17  Yes Hosie Poisson, MD  Simethicone (GAS-X PO) Take 2 tablets by mouth daily as needed (flatulence).    Yes [provider]  sodium bicarbonate 650 MG tablet Take 650 mg by mouth 3 (three) times daily. 08/19/17  Yes [provider]  patiromer (VELTASSA) 8.4 g packet Take 1 packet (8.4 g total) by mouth daily. Patient not taking: Reported on 10/22/2017 09/23/17   Hosie Poisson, MD    Current Medications Scheduled Meds: . atorvastatin  40 mg Oral Daily  . famotidine  20 mg Oral Daily  . heparin  5,000 Units Subcutaneous Q8H  . insulin aspart  0-15 Units Subcutaneous Q4H  . nicotine  21 mg  Transdermal Daily   Continuous Infusions: . sodium chloride    . ceFEPime (MAXIPIME) IV    . furosemide 160 mg (10/31/17 0118)  . midazolam (VERSED) infusion 2 mg/hr (10/31/17 0118)  . norepinephrine (LEVOPHED) Adult infusion    .  sodium bicarbonate  infusion 1000 mL 75 mL/hr at 10/21/2017 2124  . [START ON 11/01/2017] vancomycin     PRN Meds:.sodium chloride, acetaminophen **OR** acetaminophen, albuterol, fentaNYL (SUBLIMAZE) injection, fentaNYL (SUBLIMAZE) injection, midazolam  CBC Recent Labs  Lab  1024 10/29/2017 1907 10/31/17 0025  WBC 17.5* 21.6*  --   NEUTROABS 15.2*  --   --   HGB 9.5* 8.7* 9.5*  HCT 31.3* 29.2* 28.0*  MCV 86.5 86.9  --   PLT 376 405*  --  Basic Metabolic Panel Recent Labs  Lab 11/09/2017 1024 10/27/2017 1615 10/27/2017 1907 10/25/2017 2048 10/31/17 0025  NA 140 141 141 141 144  K 4.7 4.8 5.1 4.4 4.9  CL 114* 113* 117* 115* 116*  CO2 11* 12* 12* 14*  --   GLUCOSE 142* 144* 154* 143* 114*  BUN 47* 50* 52* 54* 51*  CREATININE 3.73* 3.93* 4.05* 3.98* 4.30*  CALCIUM 8.7* 8.6* 8.1* 8.3*  --     Physical Exam  Blood pressure 104/82, pulse (!) 52, temperature 97.8 F (36.6 C), temperature source Axillary, resp. rate (!) 26, height 5\' 9"  (1.753 m), weight 63.5 kg, SpO2 92 %. GEN: chronically ill appearing,  ENT: ETT in place EYES: Eyes closed  CV: Tachy, regular, PULM: Inc RR, inc WOB, breathing over vent; diffuse rhonci ABD: s/nt/ SKIN: no rashes/lesions EXT:1+ LEE RUE BVT +B/T  Assessment 64F late stage CKD presenting with rapdily progressive hypoxic and hypercapneic RF.  Has a mixed respiratory and metabolic acidosis.  Appears not to be responding to high-dose diuretics.  Needs to initiate CRRT for control of acidosis and ultrafiltration.   1. 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 Metabolic Acidosis 3. Hypoxic / Hypercapneic RF, Pulm Edema, ? PNA; intubated 4. Anemia, mild 5. Leukocytosis, Vanc/Cefepime for ? PNA 6. dCHF  Plan 1. Lasix 160 IV  now  2. Given acidosis + hypoxia / pulm edema will start CRRT 3. Stop NaHCO3 gtt 4. D/w CCM, who will assist with HD cath 5. CRRT: all 4K, UF 0-118mL/hr neg negative 6. Can not use this AVF at this time, immature   Pearson Grippe MD [phone] pgr 10/31/2017, 1:40 AM

## 2017-10-31 NOTE — Progress Notes (Signed)
Critical triponin at [place] reported to [place].

## 2017-10-31 NOTE — Procedures (Signed)
Pt was seen while on CVVHD and has been tolerating UF of 152ml/hr, however she clotted her filter at 9am and is now having increased pressures.  Will start heparin gtt with CVVHD and d/c sq heparin injections and follow.

## 2017-10-31 NOTE — Progress Notes (Signed)
   10/31/17 1830  Clinical Encounter Type  Visited With Family  Visit Type Psychological support  Referral From Nurse   Provided support to Adriena's only daughter, Sharyn Lull, as she got the news that her mother might not recover from her pneumonia. Waited with her until Michelle's son and daughter arrived. Sharyn Lull did not want or need any further support at that time.

## 2017-10-31 NOTE — Progress Notes (Signed)
Patient went into SVT into the 150s and BP dropped into the 70s. CCM called and is coming up to assess patient. Will continue to monitor closely. Bartholomew Crews, RN 10/31/2017 7:29 PM

## 2017-10-31 NOTE — Progress Notes (Addendum)
Diana Walters  DDU:[id number] DOB: 03-Jun-1955 DOA: 11/13/2017 PCP: Renato Shin, MD    LOS: 1 day   Reason for Consult / Chief Complaint:  Acute Respiratory Distress  Consulting MD and date:  TRH Dhungel, 9/15  HPI/Summary of hospital stay:  62 year old female with PMH of Tobacco Use, CVA, Anemia, HLD, Thoracic Aortic Aneurysm s/p endovascular stent graft 08/2017, anxiety, HTN, COPD, RA, CKD stage IV with new right arm fistula, Diastolic HF  Presents to ED On 9/15 with dyspnea. Admitted with hypoxemic respiratory failure, acute pulmonary edema, AoCKD, metabolic acidosis.  Started on BiPAP but failed and required intubation.  Did not respond to diuresis; therefore, started CRRT overnight.  9/15 admit, intubated 9/16 started on CRRT  Subjective:  Comfortable on vent.  Bradycardic with HR in 40s.  Objective   Blood pressure 97/80, pulse (!) 39, temperature 97.7 F (36.5 C), temperature source Oral, resp. rate (!) 26, height 5\' 9"  (1.753 m), weight 63.7 kg, SpO2 98 %. CVP:  [10 mmHg-12 mmHg] 12 mmHg  Vent Mode: PRVC FiO2 (%):  [10 %-100 %] 90 % Set Rate:  [24 bmp-26 bmp] 24 bmp Vt Set:  [530 mL-590 mL] 530 mL PEEP:  [5 cmH20-12 cmH20] 8 cmH20 Plateau Pressure:  [23 cmH20-34 cmH20] 23 cmH20   Intake/Output Summary (Last 24 hours) at 10/31/2017 0837 Last data filed at 10/31/2017 0800 Gross per 24 hour  Intake 502.95 ml  Output 253 ml  Net 249.95 ml   Filed Weights   10/29/2017 1047 10/31/17 0308  Weight: 63.5 kg 63.7 kg    Examination: General: Adult female chronically ill appearing, on vent  HENT: [place]/AT.  MMM.  ETT in place. Lungs: Crackles throughout, no wheeze. Cardiovascular: RRR, no MRG.  Abdomen: Soft, distended, active bowel sounds.  Extremities: +2 BLE edema.  Neuro: Sedated, withdraws to noxious stimuli.  Consults: date of consult/date signed off & final recs:  PCCM 9/15 > Renal 9/16 > started on CRRT.  Procedures: ETT 9/15 > Right Subclavian CVC 9/15 >    L femoral CVL 9/16 >   Significant Diagnostic Tests: CXR 9/15 > opacities within the RLL and hilum, acute edema.  Micro Data: Blood 9/15 > Tracheal Asp 9/15 > U/A 9/15 >  Antimicrobials:  Cefepime 9/15 > Vancomycin 9/15 >   Assessment & Plan:   Acute Hypoxic Respiratory Distress in setting of cardiogenic pulmonary edema secondary to decompensated heart failure with acute on chronic kidney disease vs ESRD + PNA. H/O COPD, Tobacco Use  Plan Continue Vent Support. Assess ABG now and adjust vent accordingly. Bronchial Hygiene. Continue BD's. Follow CXR.  PNA. Plan Continue empiric vanc / cefepime and follow cultures.  ? PE - possible (D dimer 14 with high O2 requirements); however, has other reasons for hypoxemia. Plan: F/u on echo, if has findings suggestive of possible PE then can start empiric heparin until await stable enough for definitive imaging.  Shock - presumed multifactorial; septic in setting PNA and cardiogenic in setting decompensated heart failure (Echo from July 2019 with EF 50 - 55%, G2DD). Bradycardia - unclear etiology, could be due to acidosis vs prolonged QTc. Elevated troponin - ? Demand. Hx AAA s/p stent graft July 2019, HTN, HLD. Plan  Continue levophed, goal MAP > 65. Start dopamine. F/u on echo, co-ox. Trend troponin. Continue preadmission lipitor. Hold preadmission antihypertensives.  Acute on Chronic Stage IV Kidney Disease vs Progression to ESRD. Anion Gap Metabolic Acidosis - improving with HCO3 and CRRT. Plan  Continue CRRT per Nephrology. Continue HCO3 for now. Follow BMP.  Anemia in setting of chronic disease. Plan  Transfuse for Hgb < 7. Follow CBC.  Hyperglycemia - no hx DM. Plan  SSI.  Sedation Needs due to mechanical ventilation. H/O CVA, RA. Plan  Sedation: fentanyl gtt / midazolam gtt (attempt to wean off midazolam). RASS goal 0/-1. Daily WUA.   Disposition / Summary of Today's Plan 10/31/17   Continue vent,  CRRT, abx. F/u on echo, if findings suggestive of PE then can consider empiric heparin while awaiting further imaging.    DVT prophylaxis: Heparin SQ GI prophylaxis: Pepcid  Diet: NPO Mobility:Bedrest  Code Status: Full Code  Family Communication: Family updated at bedside  Labs   CBC: Recent Labs  Lab 11/07/2017 1024 10/22/2017 1907 10/31/17 0025 10/31/17 0519  WBC 17.5* 21.6*  --  11.3*  NEUTROABS 15.2*  --   --   --   HGB 9.5* 8.7* 9.5* 8.3*  HCT 31.3* 29.2* 28.0* 25.9*  MCV 86.5 86.9  --  82.2  PLT 376 405*  --  169   Basic Metabolic Panel: Recent Labs  Lab 11/09/2017 1024 11/10/2017 1615 10/26/2017 1907 10/25/2017 2048 10/31/17 0008 10/31/17 0025 10/31/17 0519  NA 140 141 141 141  --  144 143  143  K 4.7 4.8 5.1 4.4  --  4.9 4.3  4.3  CL 114* 113* 117* 115*  --  116* 115*  114*  CO2 11* 12* 12* 14*  --   --  17*  17*  GLUCOSE 142* 144* 154* 143*  --  114* 96  94  BUN 47* 50* 52* 54*  --  51* 50*  51*  CREATININE 3.73* 3.93* 4.05* 3.98*  --  4.30* 3.79*  3.78*  CALCIUM 8.7* 8.6* 8.1* 8.3*  --   --  8.0*  8.0*  MG  --   --   --   --  2.2  --  2.2  PHOS  --   --   --   --   --   --  4.9*   GFR: Estimated Creatinine Clearance: 15.3 mL/min (A) (by C-G formula based on SCr of 3.79 mg/dL (H)). Recent Labs  Lab 10/19/2017 1024 10/17/2017 1907 10/17/2017 1948 11/12/2017 2248 10/31/17 0519  PROCALCITON  --   --  21.07  --  30.69  WBC 17.5* 21.6*  --   --  11.3*  LATICACIDVEN  --   --  2.1* 2.7*  --    Liver Function Tests: Recent Labs  Lab 10/31/17 0519  ALBUMIN 2.4*   No results for input(s): LIPASE, AMYLASE in the last 168 hours. No results for input(s): AMMONIA in the last 168 hours. ABG    Component Value Date/Time   PHART 7.329 (L) 10/31/2017 0003   PCO2ART 28.2 (L) 10/31/2017 0003   PO2ART 121.0 (H) 10/31/2017 0003   HCO3 14.9 (L) 10/31/2017 0003   TCO2 17 (L) 10/31/2017 0025   ACIDBASEDEF 10.0 (H) 10/31/2017 0003   O2SAT 99.0 10/31/2017 0003      Coagulation Profile: No results for input(s): INR, PROTIME in the last 168 hours. Cardiac Enzymes: Recent Labs  Lab 10/31/17 0008 10/31/17 0519  TROPONINI 0.94* 1.24*   HbA1C: Hgb A1c MFr Bld  Date/Time Value Ref Range Status  11/30/2012 05:53 AM 5.8 (H) <5.7 % Final    Comment:    (NOTE)  According to the ADA Clinical Practice Recommendations for 2011, when HbA1c is used as a screening test:  >=6.5%   Diagnostic of Diabetes Mellitus           (if abnormal result is confirmed) 5.7-6.4%   Increased risk of developing Diabetes Mellitus References:Diagnosis and Classification of Diabetes Mellitus,Diabetes QMVH,8469,62(XBMWU 1):S62-S69 and Standards of Medical Care in         Diabetes - 2011,Diabetes XLKG,4010,27 (Suppl 1):S11-S61.   CBG: Recent Labs  Lab 10/28/2017 1953 11/04/2017 2344 10/31/17 0401  GLUCAP 127* 101* 85   CC time: 35 min.  Montey Hora, [facility] Pager: [phone]  or [phone] 10/31/2017, 9:00 AM  Attending Note:  62 year old female with PMH who presents with PNA, acute on chronic renal failure, mixed acidosis.  On exam, diffuse crackles noted.  I reviewed CXR myself, diffuse infiltrate noted.  Discussed with PCCM-NP.  Patient has a very high peak pressure on PRVC.  Changed to PCV with ABG ordered as a follow up with a higher rate.  CRRT at -50 to 100 ml/hr out.  Continue pressor support.  Cefepime and vanc as ordered.  Maintain sedated for comfort at this time.  Replace electrolytes.  Will adjust vent according to ABG ordered at 1230 pm.  The patient is critically ill with multiple organ systems failure and requires high complexity decision making for assessment and support, frequent evaluation and titration of therapies, application of advanced monitoring technologies and extensive interpretation of multiple databases.   Critical Care  Time devoted to patient care services described in this note is  33  Minutes. This time reflects time of care of this signee Dr Jennet Maduro. This critical care time does not reflect procedure time, or teaching time or supervisory time of PA/NP/Med student/Med Resident etc but could involve care discussion time.  Rush Farmer, M.D. [facility] Care Medicine. Pager: [phone]. After hours pager: [phone].

## 2017-10-31 NOTE — Progress Notes (Signed)
CRITICAL VALUE ALERT  Critical Value:  Troponin 1.22  Date & Time Notied:  10/31/17 1415  Provider Notified: Yes  Orders Received/Actions taken: None at this time

## 2017-10-31 NOTE — Progress Notes (Signed)
Patient's BP continues to drop, albumin 50g ordered, vasopressin ordered, epi/levo on board. Will continue to monitor. Bartholomew Crews, RN 10/31/2017 7:31 PM

## 2017-10-31 NOTE — Progress Notes (Signed)
//  [facility] Progress Note Patient Name: Diana Walters OB: [birth year] MRN: [id number]   Date of Service  10/31/2017  HPI/Events of Note  BG on 50%/PC 15/Rate 30/P 10 = 7.11/70.9/78/22.7  eICU Interventions  Will order: 1. Change to PRVC 35/TV 350 (Ppeak = 36 and Pplat = 31) 2. Repeat ABG at 9:15 PM.     Intervention Category Major Interventions: Respiratory failure - evaluation and management;Acid-Base disturbance - evaluation and management  Mikylah Ackroyd Eugene 10/31/2017, 8:13 PM

## 2017-10-31 NOTE — Progress Notes (Signed)
PCCM Interval Note   Patient noted to have Bradycardia and Rhythm Change. EKG with PVCs and Left Ventricular Hypertrophy with QTC of 629. ISTAT Chem 8 with K 4.9. Troponin pending. Reviewed medications with Pharmacy to ensure no QTC prolonging medications were being administered. Cardiology consulted.   Addendum: Patient has been given 80 meq x 2 of Lasix with minimal output. Nephrology consulted and requested patient to be given 160 meq lasix and they would come to bedside to evaluate.   Hayden Pedro, AGACNP-BC [facility]  Pgr: [phone]  PCCM Pgr: [phone]

## 2017-10-31 NOTE — Consult Note (Signed)
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 The patient was placed on BiPAP on 10/23/2017. Oxygen requirement continued to increase. Eventually the patient was minimally responsive and noted to have frothy pink sputum. For worsening respiratory distress she was eventually intubated.  A chest x-ray was suspicious for pneumonia vs pulmonary edema. The WBC was elevated at 21.6. She was placed on broad-spectrum antibiotics (cefepime and vancomycin). There was minimal to no response to intravenous boluses of furosemide.  The troponin I (POC) in the ED was 0.19. The D-dimer was 14.31 and the BNP was >4500. Other labs were as follows: potassium 4.9, BUN 51, creatinine 4.3, bicarbonate 14.9, GFR 13, lactic acid 2.1.   In the medical ICU the patient was noted to have episodes of hypotension and bradycardia. At least one of these events occurred when she received a bolus of IV sedation. She has not required any pressors. Her ECG reveals ST segment abnormalities with QT prolongation.   Prior Cardiac Work-up: Echocardiogram 09/06/2017 - Left ventricle: The cavity size was normal. There was moderate   concentric hypertrophy. Systolic function was normal. The   estimated ejection fraction was in the range of 50% to  55%. Wall   motion was normal; there were no regional wall motion   abnormalities. Features are consistent with a pseudonormal left   ventricular filling pattern, with concomitant abnormal relaxation   and increased filling pressure (grade 2 diastolic dysfunction). - Aortic valve: There was trivial regurgitation. - Mitral valve: Severe posterior MAC. There was mild regurgitation. - Left atrium: The atrium was severely dilated. - Right atrium: The atrium was moderately dilated. - Atrial septum: No defect or patent foramen ovale was identified   on this study, though has been reported on prior TEE. - Tricuspid valve: There was moderate regurgitation. - Pulmonary arteries: PA peak pressure: 32 mm Hg (S).  Echocardiogram 06/05/2017 - Left ventricle: Inferior , septal and apical hypokinesis The   cavity size was moderately dilated. Wall thickness was increased   in a pattern of mild LVH. Systolic function was moderately   reduced. The estimated ejection fraction was in the range of 35%   to 40%. Doppler parameters are consistent with both elevated   ventricular end-diastolic filling pressure and elevated left   atrial filling pressure. - Aortic valve: Calcified right coronary cusp. There was mild   regurgitation. - Mitral valve: Severe posterior annular calcification. There was   mild regurgitation. - Left atrium: The atrium was mildly dilated. - Atrial septum: No defect or patent foramen ovale was identified. - Tricuspid valve: There was moderate-severe regurgitation.  Echocardiogram 01/14/2017 - Left ventricle: The cavity size was normal. Wall thickness was   increased in a pattern of mild LVH. Systolic function was normal.   The estimated ejection fraction was in the range of 55% to 60%.   Wall motion was normal; there were no  regional wall motion   abnormalities. Features are consistent with a pseudonormal left   ventricular filling pattern, with concomitant abnormal relaxation    and increased filling pressure (grade 2 diastolic dysfunction). - Aortic valve: There was trivial regurgitation. - Mitral valve: Calcified annulus. Mildly thickened leaflets . - Right ventricle: The cavity size was mildly dilated.  Echocardiogram 11/30/2012 Left ventricle: The cavity size was normal. Wall thickness was increased in a pattern of moderate LVH, somewhat asymmetric septal hypertrophy without obvious LVOT gradient. Systolic function was normal. The estimated ejection fraction was in the range of 60% to 65%. Wall motion was normal; there were no regional wall motion abnormalities. Doppler parameters are consistent with abnormal left ventricular relaxation (grade 1 diastolic dysfunction). - Aortic valve: Mildly calcified annulus. Trileaflet; mildly calcified leaflets. No significant regurgitation. - Aortic root: The aortic root was normal in size. Patient reported to be status post thoracic aortic aneuysm stent grafting - the arch and descending aorta are not well seen. Possible limited view of stent seen in ascending aorta with surrounding native aortic tissue - inadequate for objective sizing. - Mitral valve: Calcified annulus. Trivial regurgitation. - Left atrium: The atrium was at the upper limits of normal in size. - Right atrium: The atrium was at the upper limits of normal in size. Central venous pressure: 92m Hg (est). - Atrial septum: No defect or patent foramen ovale was identified based on limited views. - Tricuspid valve: Physiologic regurgitation. - Pulmonary arteries: Systolic pressure could not be accurately estimated. - Pericardium, extracardiac: There was no pericardial effusion.  Home Medications Prior to Admission medications   Medication Sig Start Date End Date Taking? Authorizing Provider  acetaminophen (TYLENOL) 325 MG tablet Take 650 mg by mouth every 6 (six) hours as needed for moderate pain.    Yes  [provider]  amLODipine (NORVASC) 5 MG tablet Take 1 tablet (5 mg total) by mouth daily. 07/05/17 10/19/2017 Yes HDaune Perch NP  atorvastatin (LIPITOR) 40 MG tablet Take 1 tablet (40 mg total) by mouth daily. 06/22/17  Yes HDaune Perch NP  famotidine (PEPCID) 20 MG tablet Take 1 tablet (20 mg total) by mouth daily. 09/23/17  Yes AHosie Poisson MD  furosemide (LASIX) 80 MG tablet Take 2 tablets (160 mg total) by mouth daily. 09/23/17  Yes AHosie Poisson MD  hydrALAZINE (APRESOLINE) 50 MG tablet Take 1.5 tablets (75 mg total) by mouth 3 (three) times daily. 06/22/17  Yes HDaune Perch NP  isosorbide mononitrate (IMDUR) 60 MG 24 hr tablet Take 1 tablet (60 mg total) by mouth daily. 06/22/17  Yes HDaune Perch NP  Menthol-Camphor (ICY HOT ADVANCED RELIEF) 16-11 % CREA Apply 1 application topically daily as needed (knee pain).   Yes [provider]  metoprolol tartrate (LOPRESSOR) 25 MG tablet Take 0.5 tablets (12.5 mg total) by mouth daily. 09/23/17  Yes AHosie Poisson MD  Multiple Vitamin (MULTIVITAMIN) tablet Take 1 tablet by mouth daily.     Yes [provider]  Nutritional Supplements (FEEDING SUPPLEMENT, NEPRO CARB STEADY,) LIQD Take 237 mLs by mouth at bedtime. 09/23/17  Yes AHosie Poisson MD  oxyCODONE-acetaminophen (PERCOCET/ROXICET) 5-325 MG tablet Take 1-2 tablets by mouth every 4 (four) hours as needed for moderate pain. 09/23/17  Yes AHosie Poisson MD  Simethicone (GAS-X PO) Take 2 tablets by mouth daily as needed (flatulence).    Yes [provider]  sodium bicarbonate 650 MG tablet Take 650 mg by mouth 3 (three) times daily. 08/19/17  Yes [provider]  patiromer (VELTASSA) 8.4 g packet Take 1 packet (8.4 g total) by mouth daily. Patient not taking: Reported on 11/11/2017 09/23/17   Hosie Poisson, MD    Past Medical History: Past Medical History:  Diagnosis Date  . ANEMIA 10/17/2009  . ANXIETY 10/28/2006  . Arthritis   . ASTHMA 10/28/2006   no  inhalers per pt  . Blood transfusion without reported diagnosis   . Depression   . Duodenitis without mention of hemorrhage 2005  . Heart murmur   . HYPERCHOLESTEROLEMIA 04/01/2008  . HYPERTENSION 10/28/2006  . Menopause    age 18  . Osteoporosis    bil knees  . SEIZURE DISORDER 04/01/2008   past- medications caused it   . Stroke ([place]) 11/2012  . THORACIC AORTIC ANEURYSM 04/01/2008    Past Surgical History: Past Surgical History:  Procedure Laterality Date  . AV FISTULA PLACEMENT Right 09/22/2017   Procedure: ARTERIOVENOUS (AV) FISTULA CREATION;  Surgeon: Waynetta Sandy, MD;  Location: [facility];  Service: Vascular;  Laterality: Right;  . ESOPHAGOGASTRODUODENOSCOPY  01/29/2004  . 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 LOOP RECORDER IMPLANT N/A 12/01/2012   Procedure: LOOP RECORDER IMPLANT;  Surgeon: Coralyn Mark, MD;  Location: [place] [facility] CATH LAB;  Service: Cardiovascular;  Laterality: N/A;  . TEE WITHOUT CARDIOVERSION N/A 12/01/2012   Procedure: TRANSESOPHAGEAL ECHOCARDIOGRAM (TEE);  Surgeon: Fay Records, MD;  Location: [facility] ENDOSCOPY;  Service: Cardiovascular;  Laterality: N/A;  . THORACIC AORTIC ENDOVASCULAR STENT GRAFT N/A 09/08/2017   Procedure: THORACIC AORTIC ENDOVASCULAR STENT GRAFT;  Surgeon: Waynetta Sandy, MD;  Location: [facility] OR;  Service: Vascular;  Laterality: N/A;  . VASCULAR SURGERY  8-9 years ago per patient   thoracic aortic anyuresm repair with patch     Family History: Family History  Problem Relation Age of Onset  . Breast cancer Mother   . Heart disease Mother        before age 8  . Colon cancer Neg Hx   . Esophageal cancer Neg Hx   . Pancreatic cancer Neg Hx   . Rectal cancer Neg Hx   . Stomach cancer Neg Hx     Social History: Social History   Socioeconomic History  . Marital status: Single    Spouse name: Not on file  . Number of children: Not on file  . Years of education: Not on  file  . Highest education level: Not on file  Occupational History  . Not on file  Social Needs  . Financial resource strain: Not on file  . Food insecurity:    Worry: Not on file    Inability: Not on file  . Transportation needs:    Medical: Not on file    Non-medical: Not on file  Tobacco Use  . Smoking status: Current Every Day Smoker    Packs/day: 0.25    Years: 12.00    Pack years: 3.00    Types: Cigarettes  . Smokeless tobacco: Never Used  . Tobacco comment: patient is ready to stop  Substance and Sexual Activity  . Alcohol use: Not Currently    Alcohol/week: 0.0 standard drinks  . Drug use: Yes    Types: Marijuana  . Sexual activity: Not Currently    Birth control/protection: None  Lifestyle  . Physical activity:    Days per week: Not on file    Minutes per session: Not on file  . Stress: Not on file  Relationships  . Social connections:    Talks on phone: Not on file    Gets together: Not on file    Attends religious service: Not on file    Active member of club or organization: Not on file    Attends meetings of clubs or organizations: Not on file    Relationship status: Not on file  Other Topics Concern  . Not on file  Social History Narrative  . Not on file    Allergies:  Allergies  Allergen Reactions  . Ibuprofen Other (See Comments)    Can't take due to medications  PT DOESN'T RECALL THIS ALLERGY  . Codeine Other (See Comments)    Hallucinations  PT DOESN'T RECALL THIS ALLERGY     Review of Systems: [y] = yes, _0  = no   . General: Weight gain _1 ; Weight loss _2 ; Anorexia _3 ; Fatigue _4 ; Fever _5 ; Chills _6 ; Weakness _7   . Cardiac: Chest pain/pressure _8 ; Resting SOB _9 ; Exertional SOB _10 ; Orthopnea _11 ; Pedal Edema _12 ; Palpitations _13 ; Syncope _14 ; Presyncope _15 ; Paroxysmal nocturnal dyspnea_16   . Pulmonary: Cough _17 ; Wheezing_18 ; Hemoptysis_19 ; Sputum _20 ; Snoring _21   . GI: Vomiting_22 ; Dysphagia_23 ; Melena_24 ;  Hematochezia _25 ; Heartburn_26 ; Abdominal pain _27 ; Constipation _28 ; Diarrhea _29 ; BRBPR _30   . GU: Hematuria_31 ; Dysuria _32 ; Nocturia_33   . Vascular: Pain in legs with walking _34 ; Pain in feet with lying flat _35 ; Non-healing sores _36 ; Stroke _37 ; TIA _38 ; Slurred speech _39 ;  . Neuro: Headaches_40 ; Vertigo_41 ; Seizures_42 ; Paresthesias_43 ;Blurred vision _44 ; Diplopia _45 ; Vision changes _46   . Ortho/Skin: Arthritis _47 ; Joint pain _48 ; Muscle pain _49 ; Joint swelling _50 ; Back Pain _51 ; Rash _52   . Psych: Depression_53 ; Anxiety_54   . Heme: Bleeding problems _55 ; Clotting disorders _56 ; Anemia _57   . Endocrine: Diabetes _58 ; Thyroid dysfunction_59      Objective:    Vital Signs:   Temp:  [96.2 F (35.7 C)-97.8 F (36.6 C)] 97.8 F (36.6 C) (09/15 2300) Pulse Rate:  [89-112] 89 (09/16 0000) Resp:  [21-38] 21 (09/16 0000) BP: (77-150)/(23-104) 77/60 (09/16 0000) SpO2:  [82 %-100 %] 100 % (09/16 0000) FiO2 (%):  [10 %-100 %] 100 % (09/15 2311) Weight:  [63.5 kg] 63.5 kg (09/15 1047)    Weight change: Filed Weights   10/23/2017 1047  Weight: 63.5 kg    Intake/Output:   Intake/Output Summary (Last 24 hours) at 10/31/2017 0032 Last data filed at 10/24/2017 2300 Gross per 24 hour  Intake -  Output 81 ml  Net -81 ml      Physical Exam    General:  In moderate distress and intubated HEENT: unable to assess Neck: No lymphadenopathy or thyromegaly appreciated. Cor: PMI nondisplaced. Regular rate & rhythm. Difficult to appreciate any murmurs. Lungs: coarse breath sounds and crackles bilaterally Abdomen: soft, nondistended. No hepatosplenomegaly. No bruits or masses. Extremities: no cyanosis, clubbing, rash, edema Neuro: Patient is intubated    Labs   Basic Metabolic Panel: Recent Labs  Lab 11/02/2017 1024 10/23/2017 1615 11/01/2017 1907 11/12/2017 2048 10/31/17 0025  NA 140 141 141 141 144  K 4.7 4.8 5.1 4.4 4.9  CL 114* 113* 117* 115* 116*  CO2 11* 12* 12*  14*  --     GLUCOSE 142* 144* 154* 143* 114*  BUN 47* 50* 52* 54* 51*  CREATININE 3.73* 3.93* 4.05* 3.98* 4.30*  CALCIUM 8.7* 8.6* 8.1* 8.3*  --     Liver Function Tests: No results for input(s): AST, ALT, ALKPHOS, BILITOT, PROT, ALBUMIN in the last 168 hours. No results for input(s): LIPASE, AMYLASE in the last 168 hours. No results for input(s): AMMONIA in the last 168 hours.  CBC: Recent Labs  Lab 10/26/2017 1024 11/10/2017 1907 10/31/17 0025  WBC 17.5* 21.6*  --   NEUTROABS 15.2*  --   --   HGB 9.5* 8.7* 9.5*  HCT 31.3* 29.2* 28.0*  MCV 86.5 86.9  --   PLT 376 405*  --     Cardiac Enzymes: No results for input(s): CKTOTAL, CKMB, CKMBINDEX, TROPONINI in the last 168 hours.  BNP: BNP (last 3 results) Recent Labs    06/05/17 0846 10/17/2017 1024  BNP >4,500.0* >4,500.0*    ProBNP (last 3 results) No results for input(s): PROBNP in the last 8760 hours.   CBG: Recent Labs  Lab 10/31/2017 1953 11/08/2017 2344  GLUCAP 127* 101*    Coagulation Studies: No results for input(s): LABPROT, INR in the last 72 hours.   Imaging   Dg Chest Port 1 View  Result Date: 11/02/2017 CLINICAL DATA:  Central line placement EXAM: PORTABLE CHEST 1 VIEW COMPARISON:  11/14/2017, 09/08/2017, CT chest 09/08/2017 FINDINGS: Endotracheal tube tip overlies the right mainstem bronchus orifice. Right-sided central venous catheter tip over the cavoatrial junction. Worsening airspace disease in the right hemithorax. No change in diffuse airspace disease left hemithorax. Stable enlarged cardiomediastinal silhouette with extensive aortic stent grafting. IMPRESSION: 1. Endotracheal tube tip encroaches right mainstem bronchus, this was subsequently repositioned on the follow-up film 2. Worsening right greater than left diffuse airspace disease which may reflect asymmetric edema or diffuse infection. Electronically Signed   By: Donavan Foil M.D.   On: 10/17/2017 20:18   Dg Chest Port 1 View  Result Date:  11/04/2017 CLINICAL DATA:  Endotracheal tube adjustment EXAM: PORTABLE CHEST 1 VIEW COMPARISON:  11/07/2017, 09/08/2017 FINDINGS: Post sternotomy changes and extensive aortic stent graft. Right-sided central venous catheter tip overlies the cavoatrial region. Endotracheal tube tip repositioned with tip now 1.9 cm superior to the carina. Hyperinflation with extensive right greater than left airspace disease, which may reflect asymmetric edema or diffuse infection. Soft tissue density and mediastinal widening as before. IMPRESSION: 1. Endotracheal tube tip now 1.9 cm superior to the carina 2. Extensive right greater than left airspace disease which may reflect pulmonary edema or diffuse infection 3. Stable cardiomediastinal silhouette with enlarged mediastinal silhouette and extensive aortic stent grafting. Electronically Signed   By: Donavan Foil M.D.   On: 10/29/2017 20:17   Dg Chest Portable 1 View  Result Date: 11/10/2017 CLINICAL DATA:  Shortness of breath and diaphoretic for the last 2 days. EXAM: PORTABLE CHEST 1 VIEW COMPARISON:  Chest x-ray dated 09/08/2017. FINDINGS: Large dense airspace opacities within the RIGHT lower lung, and overlying the RIGHT hilum, pneumonia versus asymmetric edema. LEFT lung remains relatively clear. No pneumothorax seen. No pleural effusions seen. Cardiomediastinal silhouette appears stable in size and configuration. Aortic stent graft appears stable in configuration. No acute or suspicious osseous finding. IMPRESSION: 1. New large dense airspace opacities within the RIGHT lower lung and overlying the RIGHT hilum. Findings are consistent with pneumonia or asymmetric pulmonary edema. LEFT lung remains relatively clear. 2. Cardiomediastinal silhouette appears stable. Aortic  stent graft appears stable in configuration. Electronically Signed   By: Franki Cabot M.D.   On: 10/22/2017 11:20      Medications:     Current Medications: . atorvastatin  40 mg Oral Daily  .  famotidine  20 mg Oral Daily  . heparin  5,000 Units Subcutaneous Q8H  . insulin aspart  0-15 Units Subcutaneous Q4H  . nicotine  21 mg Transdermal Daily     Infusions: . sodium chloride    . ceFEPime (MAXIPIME) IV    . phenylephrine (NEO-SYNEPHRINE) Adult infusion    . propofol (DIPRIVAN) infusion 50 mcg/kg/min (10/16/2017 2158)  .  sodium bicarbonate  infusion 1000 mL 75 mL/hr at 10/24/2017 2124  . [START ON 11/01/2017] vancomycin         Assessment/Plan  The patient has a past history of combined systolic and diastolic congestive heart failure. The chest x-ray is suspicious for pulmonary edema with an elevated BNP of greater than 4500. Due to advanced chronic kidney disease the patient has not responded appropriately to IV boluses of furosemide. She is being evaluated by the nephrology service for possible hemodialysis. She has also had transient episodes of bradycardia and hypotension. The ECG reveals repolarization abnormalities and ST segment changes along with QT prolongation. There has been no documentation of any malignant tachyarrhythmias. The patient has not required any pressors.  1. Pulmonary edema - Obtain a new transthoracic echocardiogram to re-evaluate LV systolic and diastolic function. - Continue trials of higher doses of IV furosemide. Alternatively can also try bumetanide. - Follow-up with nephrology service recommendations for possible hemodialysis if urine output continues to decrease  2. Abnormal electrocardiogram, elevated troponin (likely secondary to demand ischemia/hypoxia) - Trend cardiac biomarkers and obtain serial ECGs - Can consider aspirin per rectum -  Would hold off on intravenous unfractionated heparin for now  3. QT prolongation Monitor for torsades de pointes. Replace potassium and magnesium as needed. Rule out medication effect.    Meade Maw, MD  10/31/2017, 12:32 AM  Cardiology Overnight Team Please contact [facility] for  night-coverage after hours (4p -7a ) and weekends on [email or web address]

## 2017-10-31 NOTE — Procedures (Signed)
Hemodialysis Catheter Insertion Procedure Note Diana Walters [id number] 1955-12-27  Procedure: Insertion of Hemodialysis Catheter Indications: Hemodialysis  Procedure Details Consent: Risks of procedure as well as the alternatives and risks of each were explained to the (patient/caregiver).  Consent for procedure obtained.  Time Out: Verified patient identification, verified procedure, site/side was marked, verified correct patient position, special equipment/implants available, medications/allergies/relevent history reviewed, required imaging and test results available.  Performed  Maximum sterile technique was used including antiseptics, cap, gloves, gown, hand hygiene, mask and sheet.  Skin prep: Chlorhexidine; local anesthetic administered  A Trialysis HD catheter was placed in the left femoral vein due to no other available access using the Seldinger technique.  Evaluation Blood flow good Complications: No apparent complications Patient did tolerate procedure well.   Procedure performed with ultrasound guidance for real time vessel cannulation.     Hayden Pedro, AG-ACNP [facility]  Pgr: [phone]  PCCM Pgr: [phone]

## 2017-10-31 NOTE — Progress Notes (Signed)
Progress Note  Patient Name: Diana Walters Date of Encounter: 10/31/2017  Primary Cardiologist: Dorris Carnes, MD   Subjective   Intubated and sedated  Inpatient Medications    Scheduled Meds: . atorvastatin  40 mg Per Tube Daily  . atropine      . chlorhexidine gluconate (MEDLINE KIT)  15 mL Mouth Rinse BID  . Chlorhexidine Gluconate Cloth  6 each Topical q morning - 10a  . famotidine  10 mg Per Tube Daily  . heparin  5,000 Units Subcutaneous Q8H  . insulin aspart  0-15 Units Subcutaneous Q4H  . mouth rinse  15 mL Mouth Rinse 10 times per day  . nicotine  21 mg Transdermal Daily   Continuous Infusions: . sodium chloride 10 mL/hr at 10/31/17 1107  . sodium chloride 10 mL/hr at 10/31/17 0700  . ceFEPime (MAXIPIME) IV 2 g (10/31/17 0800)  . DOPamine 15 mcg/kg/min (10/31/17 1308)  . fentaNYL infusion INTRAVENOUS 50 mcg/hr (10/31/17 1227)  . midazolam (VERSED) infusion 2 mg/hr (10/31/17 1257)  . norepinephrine (LEVOPHED) Adult infusion 15 mcg/min (10/31/17 1332)  . dialysis replacement fluid (prismasate) 1,200 mL/hr at 10/31/17 1327  . dialysis replacement fluid (prismasate) 500 mL/hr at 10/31/17 0417  . dialysate (PRISMASATE) 1,500 mL/hr at 10/31/17 1153  .  sodium bicarbonate  infusion 1000 mL Stopped (10/31/17 0134)  . sodium chloride    . vancomycin 750 mg (10/31/17 0949)   PRN Meds: sodium chloride, Place/Maintain arterial line **AND** sodium chloride, acetaminophen **OR** acetaminophen, albuterol, fentaNYL (SUBLIMAZE) injection, fentaNYL (SUBLIMAZE) injection, heparin, midazolam, sodium chloride   Vital Signs    Vitals:   10/31/17 1315 10/31/17 1330 10/31/17 1345 10/31/17 1400  BP:    (!) 105/59  Pulse: (!) 54 (!) 54 (!) 51 (!) 52  Resp: (!) 30 (!) 23 (!) 31 (!) 30  Temp:      TempSrc:      SpO2: 99% 100% 100% 100%  Weight:      Height:        Intake/Output Summary (Last 24 hours) at 10/31/2017 1417 Last data filed at 10/31/2017 1400 Gross per 24 hour    Intake 1010.04 ml  Output 1018 ml  Net -7.96 ml   Filed Weights   10/19/2017 1047 10/31/17 0308  Weight: 63.5 kg 63.7 kg    Telemetry    Sinus with PACs, brief PAF and NSVT - Personally Reviewed  Physical Exam   GEN: Intubated and sedated. Neck: No JVD Cardiac: RRR, 3/6 systolic murmur LSB Respiratory: CTA anteriorly GI: Soft, non-distended  MS: No edema Neuro:  Not assess as pt intubated and sedated  Labs    Chemistry Recent Labs  Lab 11/03/2017 1907 10/22/2017 2048 10/31/17 0025 10/31/17 0519  NA 141 141 144 143  143  K 5.1 4.4 4.9 4.3  4.3  CL 117* 115* 116* 115*  114*  CO2 12* 14*  --  17*  17*  GLUCOSE 154* 143* 114* 96  94  BUN 52* 54* 51* 50*  51*  CREATININE 4.05* 3.98* 4.30* 3.79*  3.78*  CALCIUM 8.1* 8.3*  --  8.0*  8.0*  ALBUMIN  --   --   --  2.4*  GFRNONAA 11* 11*  --  12*  12*  GFRAA 13* 13*  --  14*  14*  ANIONGAP 12 12  --  11  12     Hematology Recent Labs  Lab 10/29/2017 1024 10/25/2017 1907 10/31/17 0025 10/31/17 0519  WBC 17.5* 21.6*  --  11.3*  RBC 3.62* 3.36*  --  3.15*  HGB 9.5* 8.7* 9.5* 8.3*  HCT 31.3* 29.2* 28.0* 25.9*  MCV 86.5 86.9  --  82.2  MCH 26.2 25.9*  --  26.3  MCHC 30.4 29.8*  --  32.0  RDW 23.3* 22.9*  --  22.5*  PLT 376 405*  --  269    Cardiac Enzymes Recent Labs  Lab 10/31/17 0008 10/31/17 0519 10/31/17 1208  TROPONINI 0.94* 1.24* 1.22*    Recent Labs  Lab 10/27/2017 1037  TROPIPOC 0.01     BNP Recent Labs  Lab 10/24/2017 1024  BNP >4,500.0*     DDimer  Recent Labs  Lab 10/31/2017 1024  DDIMER 14.31*     Radiology    Dg Chest Port 1 View  Result Date: 11/03/2017 CLINICAL DATA:  Central line placement EXAM: PORTABLE CHEST 1 VIEW COMPARISON:  10/29/2017, 09/08/2017, CT chest 09/08/2017 FINDINGS: Endotracheal tube tip overlies the right mainstem bronchus orifice. Right-sided central venous catheter tip over the cavoatrial junction. Worsening airspace disease in the right hemithorax. No  change in diffuse airspace disease left hemithorax. Stable enlarged cardiomediastinal silhouette with extensive aortic stent grafting. IMPRESSION: 1. Endotracheal tube tip encroaches right mainstem bronchus, this was subsequently repositioned on the follow-up film 2. Worsening right greater than left diffuse airspace disease which may reflect asymmetric edema or diffuse infection. Electronically Signed   By: Donavan Foil M.D.   On: 11/10/2017 20:18   Dg Chest Port 1 View  Result Date: 10/21/2017 CLINICAL DATA:  Endotracheal tube adjustment EXAM: PORTABLE CHEST 1 VIEW COMPARISON:  10/27/2017, 09/08/2017 FINDINGS: Post sternotomy changes and extensive aortic stent graft. Right-sided central venous catheter tip overlies the cavoatrial region. Endotracheal tube tip repositioned with tip now 1.9 cm superior to the carina. Hyperinflation with extensive right greater than left airspace disease, which may reflect asymmetric edema or diffuse infection. Soft tissue density and mediastinal widening as before. IMPRESSION: 1. Endotracheal tube tip now 1.9 cm superior to the carina 2. Extensive right greater than left airspace disease which may reflect pulmonary edema or diffuse infection 3. Stable cardiomediastinal silhouette with enlarged mediastinal silhouette and extensive aortic stent grafting. Electronically Signed   By: Donavan Foil M.D.   On: 11/04/2017 20:17   Dg Chest Portable 1 View  Result Date: 11/01/2017 CLINICAL DATA:  Shortness of breath and diaphoretic for the last 2 days. EXAM: PORTABLE CHEST 1 VIEW COMPARISON:  Chest x-ray dated 09/08/2017. FINDINGS: Large dense airspace opacities within the RIGHT lower lung, and overlying the RIGHT hilum, pneumonia versus asymmetric edema. LEFT lung remains relatively clear. No pneumothorax seen. No pleural effusions seen. Cardiomediastinal silhouette appears stable in size and configuration. Aortic stent graft appears stable in configuration. No acute or suspicious  osseous finding. IMPRESSION: 1. New large dense airspace opacities within the RIGHT lower lung and overlying the RIGHT hilum. Findings are consistent with pneumonia or asymmetric pulmonary edema. LEFT lung remains relatively clear. 2. Cardiomediastinal silhouette appears stable. Aortic stent graft appears stable in configuration. Electronically Signed   By: Franki Cabot M.D.   On: 10/21/2017 11:20    Patient Profile     62 y.o. female with past medical history of prior aortic arch repair for thoracic aortic aneurysm, TEVAR with subsequent endoleak and more recent endovascular stent graft placement, hypertension, chronic stage IV kidney disease, COPD, chronic combined systolic/diastolic congestive heart failure admitted with respiratory failure requiring intubation.  Echocardiogram shows ejection fraction 40 to 45% with apical wall motion abnormality; moderate  tricuspid regurgitation with moderate to severe pulmonary hypertension.  Troponin elevated.  Assessment & Plan    1 acute pulmonary edema-transthoracic echocardiogram shows worse LV function and wall motion abnormality.  Continue statin.  Add aspirin 81 mg daily.  Blood pressure will not allow other cardiac medications.  Troponin is elevated.  She will likely require cardiac catheterization prior to discharge assuming she is felt to be end-stage renal disease.  Volume removal per dialysis.  2 acute on chronic stage IV kidney disease-she is now being dialyzed.  If she is felt to now be end-stage renal disease and dialysis dependent we will likely proceed with catheterization prior to discharge.  3 possible pneumonia-antibiotics per primary care.  4 elevated troponin-possibly secondary to demand ischemia.  I will not add heparin at this point.  5 paroxysmal atrial fibrillation-noted on telemetry.  Presently in sinus rhythm.  We will follow and if atrial fibrillation recurs consider amiodarone. CHADSvasc 5.  However this may be transient in the  setting of acute events.  We will not commit to long-term anticoagulation at this point.  For questions or updates, please contact [place] Please consult [email or web address] for contact info under        Signed, Kirk Ruths, MD  10/31/2017, 2:17 PM

## 2017-10-31 NOTE — Significant Event (Addendum)
Critical Care Attending.  Called by RN to assess regarding ongoing hypotension.   Patient admitted with septic shock secondary to pneumonia. Has required high PEEP to maintain oxygenation.  Currently hypotensive to 80's despite NE at 30.  Worsened since starting CRRT  On my assessment: extremities are warm. Moderate respiratory distress in spite of fentanyl.  Bronchial breathing right lung. Rhonchi on left.  Apex is diffuse and sustained. There is no edema.  POC echo shows reduced EF with ectopy. MR+ TR++.  (personally performed)  ASSESSMENT:  Mixed distributive and cardiogenic shock.  Continue NE , add Epi for inotropy. Decrease PEEP to improve filling as neither chamber appears enlarged.  I was able to decrease PEEP to 10 with decrease in saturation.  NMB to limit dyssynchrony which may be contributing to hemodynamic instability.   CRITICAL CARE Performed by: Kipp Brood   Total critical care time: 30 minutes  Critical care time was exclusive of separately billable procedures and treating other patients.  Critical care was necessary to treat or prevent imminent or life-threatening deterioration.  Critical care was time spent personally by me on the following activities: development of treatment plan with patient and/or surrogate as well as nursing, discussions with consultants, evaluation of patient's response to treatment, examination of patient, obtaining history from patient or surrogate, ordering and performing treatments and interventions, ordering and review of laboratory studies, ordering and review of radiographic studies, pulse oximetry and re-evaluation of patient's condition.

## 2017-10-31 NOTE — Progress Notes (Signed)
  Echocardiogram 2D Echocardiogram has been performed.  Diana Walters 10/31/2017, 9:35 AM

## 2017-11-01 ENCOUNTER — Inpatient Hospital Stay (HOSPITAL_COMMUNITY): Payer: Medicare Other

## 2017-11-01 ENCOUNTER — Inpatient Hospital Stay: Admission: RE | Admit: 2017-11-01 | Payer: Medicare Other | Source: Ambulatory Visit

## 2017-11-01 DIAGNOSIS — I214 Non-ST elevation (NSTEMI) myocardial infarction: Secondary | ICD-10-CM

## 2017-11-01 LAB — POCT I-STAT 3, ART BLOOD GAS (G3+)
Acid-Base Excess: 8 mmol/L — ABNORMAL HIGH (ref 0.0–2.0)
Acid-base deficit: 5 mmol/L — ABNORMAL HIGH (ref 0.0–2.0)
BICARBONATE: 35.7 mmol/L — AB (ref 20.0–28.0)
Bicarbonate: 25 mmol/L (ref 20.0–28.0)
O2 SAT: 96 %
O2 Saturation: 99 %
PCO2 ART: 67.8 mmHg — AB (ref 32.0–48.0)
PH ART: 7.156 — AB (ref 7.350–7.450)
Patient temperature: 92.7
Patient temperature: 93.3
TCO2: 27 mmol/L (ref 22–32)
TCO2: 38 mmol/L — ABNORMAL HIGH (ref 22–32)
pCO2 arterial: 68.2 mmHg (ref 32.0–48.0)
pH, Arterial: 7.311 — ABNORMAL LOW (ref 7.350–7.450)
pO2, Arterial: 148 mmHg — ABNORMAL HIGH (ref 83.0–108.0)
pO2, Arterial: 95 mmHg (ref 83.0–108.0)

## 2017-11-01 LAB — GLUCOSE, CAPILLARY
GLUCOSE-CAPILLARY: 138 mg/dL — AB (ref 70–99)
GLUCOSE-CAPILLARY: 128 mg/dL — AB (ref 70–99)
GLUCOSE-CAPILLARY: 65 mg/dL — AB (ref 70–99)
GLUCOSE-CAPILLARY: 58 mg/dL — AB (ref 70–99)
Glucose-Capillary: 146 mg/dL — ABNORMAL HIGH (ref 70–99)
Glucose-Capillary: 83 mg/dL (ref 70–99)
Glucose-Capillary: 141 mg/dL — ABNORMAL HIGH (ref 70–99)
Glucose-Capillary: 80 mg/dL (ref 70–99)

## 2017-11-01 LAB — RENAL FUNCTION PANEL
ALBUMIN: 2.5 g/dL — AB (ref 3.5–5.0)
ANION GAP: 7 (ref 5–15)
ANION GAP: 8 (ref 5–15)
Albumin: 2.5 g/dL — ABNORMAL LOW (ref 3.5–5.0)
BUN: 19 mg/dL (ref 8–23)
BUN: 12 mg/dL (ref 8–23)
CHLORIDE: 102 mmol/L (ref 98–111)
CHLORIDE: 105 mmol/L (ref 98–111)
CO2: 36 mmol/L — AB (ref 22–32)
CO2: 28 mmol/L (ref 22–32)
Calcium: 7.7 mg/dL — ABNORMAL LOW (ref 8.9–10.3)
Calcium: 7.3 mg/dL — ABNORMAL LOW (ref 8.9–10.3)
Creatinine, Ser: 1.63 mg/dL — ABNORMAL HIGH (ref 0.44–1.00)
Creatinine, Ser: 1.11 mg/dL — ABNORMAL HIGH (ref 0.44–1.00)
GFR calc Af Amer: 38 mL/min — ABNORMAL LOW (ref >60)
GFR calc Af Amer: 60 mL/min — ABNORMAL LOW (ref >60)
GFR calc non Af Amer: 33 mL/min — ABNORMAL LOW (ref >60)
GFR calc non Af Amer: 52 mL/min — ABNORMAL LOW (ref >60)
Glucose, Bld: 173 mg/dL — ABNORMAL HIGH (ref 70–99)
Glucose, Bld: 95 mg/dL (ref 70–99)
POTASSIUM: 4 mmol/L (ref 3.5–5.1)
Phosphorus: 3.2 mg/dL (ref 2.5–4.6)
Phosphorus: 2.3 mg/dL — ABNORMAL LOW (ref 2.5–4.6)
Potassium: 3.9 mmol/L (ref 3.5–5.1)
SODIUM: 141 mmol/L (ref 135–145)
Sodium: 145 mmol/L (ref 135–145)

## 2017-11-01 LAB — TROPONIN I
TROPONIN I: 0.51 ng/mL — AB (ref <0.03)
Troponin I: 0.43 ng/mL (ref <0.03)

## 2017-11-01 LAB — APTT: aPTT: 130 seconds — ABNORMAL HIGH (ref 24–36)

## 2017-11-01 LAB — POCT ACTIVATED CLOTTING TIME
ACTIVATED CLOTTING TIME: 169 s
ACTIVATED CLOTTING TIME: 180 s
ACTIVATED CLOTTING TIME: 186 s
ACTIVATED CLOTTING TIME: 180 s
ACTIVATED CLOTTING TIME: 197 s
ACTIVATED CLOTTING TIME: 197 s
ACTIVATED CLOTTING TIME: 202 s
Activated Clotting Time: 169 seconds
Activated Clotting Time: 175 seconds
Activated Clotting Time: 180 seconds
Activated Clotting Time: 180 seconds
Activated Clotting Time: 197 seconds
Activated Clotting Time: 197 seconds
Activated Clotting Time: 197 seconds

## 2017-11-01 LAB — BASIC METABOLIC PANEL
Anion gap: 9 (ref 5–15)
BUN: 14 mg/dL (ref 8–23)
CHLORIDE: 103 mmol/L (ref 98–111)
CO2: 29 mmol/L (ref 22–32)
CREATININE: 1.26 mg/dL — AB (ref 0.44–1.00)
Calcium: 7.3 mg/dL — ABNORMAL LOW (ref 8.9–10.3)
GFR calc Af Amer: 51 mL/min — ABNORMAL LOW (ref >60)
GFR calc non Af Amer: 44 mL/min — ABNORMAL LOW (ref >60)
Glucose, Bld: 138 mg/dL — ABNORMAL HIGH (ref 70–99)
Potassium: 3.8 mmol/L (ref 3.5–5.1)
SODIUM: 141 mmol/L (ref 135–145)

## 2017-11-01 LAB — CBC
HCT: 23.7 % — ABNORMAL LOW (ref 36.0–46.0)
Hemoglobin: 7.5 g/dL — ABNORMAL LOW (ref 12.0–15.0)
MCH: 25.9 pg — ABNORMAL LOW (ref 26.0–34.0)
MCHC: 31.6 g/dL (ref 30.0–36.0)
MCV: 81.7 fL (ref 78.0–100.0)
PLATELETS: 176 10*3/uL (ref 150–400)
RBC: 2.9 MIL/uL — AB (ref 3.87–5.11)
RDW: 22.5 % — ABNORMAL HIGH (ref 11.5–15.5)
WBC: 8.9 10*3/uL (ref 4.0–10.5)

## 2017-11-01 LAB — PROCALCITONIN: Procalcitonin: 29.14 ng/mL

## 2017-11-01 LAB — PHOSPHORUS: Phosphorus: 3.2 mg/dL (ref 2.5–4.6)

## 2017-11-01 LAB — MAGNESIUM
Magnesium: 2.4 mg/dL (ref 1.7–2.4)
Magnesium: 2.4 mg/dL (ref 1.7–2.4)

## 2017-11-01 LAB — LACTIC ACID, PLASMA: Lactic Acid, Venous: 1.1 mmol/L (ref 0.5–1.9)

## 2017-11-01 MED ORDER — DEXTROSE 50 % IV SOLN
INTRAVENOUS | Status: AC
  Administered 2017-11-01: 50 mL
  Filled 2017-11-01: qty 50

## 2017-11-01 MED ORDER — ASPIRIN 81 MG PO CHEW
81.0000 mg | CHEWABLE_TABLET | Freq: Every day | ORAL | Status: DC
  Administered 2017-11-01 – 2017-11-05 (×5): 81 mg
  Filled 2017-11-01 (×5): qty 1

## 2017-11-01 MED ORDER — VITAL AF 1.2 CAL PO LIQD
1000.0000 mL | ORAL | Status: DC
  Administered 2017-11-01 – 2017-11-04 (×2): 1000 mL
  Filled 2017-11-01: qty 1000

## 2017-11-01 MED ORDER — FAMOTIDINE 20 MG PO TABS
20.0000 mg | ORAL_TABLET | Freq: Every day | ORAL | Status: DC
  Administered 2017-11-02 – 2017-11-05 (×4): 20 mg
  Filled 2017-11-01 (×4): qty 1

## 2017-11-01 MED ORDER — SODIUM BICARBONATE 8.4 % IV SOLN
200.0000 meq | Freq: Once | INTRAVENOUS | Status: AC
  Administered 2017-11-01: 200 meq via INTRAVENOUS
  Filled 2017-11-01: qty 50

## 2017-11-01 MED ORDER — SODIUM CHLORIDE 0.9 % IV SOLN
3.0000 ug/kg/min | INTRAVENOUS | Status: DC
  Administered 2017-11-01: 1.5 ug/kg/min via INTRAVENOUS
  Filled 2017-11-01: qty 20

## 2017-11-01 MED ORDER — DOPAMINE-DEXTROSE 3.2-5 MG/ML-% IV SOLN
0.0000 ug/kg/min | INTRAVENOUS | Status: DC
  Administered 2017-11-01: 5 ug/kg/min via INTRAVENOUS
  Administered 2017-11-03: 8 ug/kg/min via INTRAVENOUS
  Filled 2017-11-01 (×2): qty 250

## 2017-11-01 NOTE — Progress Notes (Signed)
Progress Note  Patient Name: Diana Walters Date of Encounter: 11/01/2017  Primary Cardiologist: Dorris Carnes, MD   Subjective   Sedated  Inpatient Medications    Scheduled Meds: . artificial tears  1 application Both Eyes W7P  . aspirin  81 mg Per Tube Daily  . atorvastatin  40 mg Per Tube Daily  . B-complex with vitamin C  1 tablet Per Tube Daily  . chlorhexidine gluconate (MEDLINE KIT)  15 mL Mouth Rinse BID  . Chlorhexidine Gluconate Cloth  6 each Topical q morning - 10a  . famotidine  10 mg Per Tube Daily  . insulin aspart  0-15 Units Subcutaneous Q4H  . mouth rinse  15 mL Mouth Rinse 10 times per day  . nicotine  21 mg Transdermal Daily   Continuous Infusions: . sodium chloride 10 mL/hr at 11/01/17 1000  . sodium chloride 10 mL/hr at 10/31/17 0700  . ceFEPime (MAXIPIME) IV Stopped (11/01/17 0858)  . cisatracurium (NIMBEX) infusion 1.5 mcg/kg/min (11/01/17 1000)  . DOPamine 8 mcg/kg/min (11/01/17 1000)  . feeding supplement (PIVOT 1.5 CAL) 45 mL/hr at 11/01/17 1000  . fentaNYL infusion INTRAVENOUS 200 mcg/hr (11/01/17 1000)  . heparin 10,000 units/ 20 mL infusion syringe 2.4 Units/hr (11/01/17 1000)  . heparin    . midazolam (VERSED) infusion 10 mg/hr (11/01/17 1101)  . norepinephrine (LEVOPHED) Adult infusion 9 mcg/min (11/01/17 1006)  . dialysis replacement fluid (prismasate) 1,200 mL/hr at 11/01/17 0451  . dialysis replacement fluid (prismasate) 500 mL/hr at 11/01/17 0146  . dialysate (PRISMASATE) 1,500 mL/hr at 11/01/17 0931  . sodium chloride 999 mL/hr at 10/31/17 0900   PRN Meds: sodium chloride, Place/Maintain arterial line **AND** sodium chloride, acetaminophen **OR** acetaminophen, albuterol, fentaNYL, fentaNYL (SUBLIMAZE) injection, fentaNYL (SUBLIMAZE) injection, fentaNYL (SUBLIMAZE) injection, heparin, heparin, heparin, midazolam, midazolam, sodium chloride   Vital Signs    Vitals:   11/01/17 0840 11/01/17 0900 11/01/17 1000 11/01/17 1100  BP: (!)  128/43 (!) 67/37 (!) 66/45 (!) 70/39  Pulse: (!) 43 (!) 42 (!) 44 (!) 47  Resp: (!) 35 (!) 35 (!) 35 (!) 31  Temp:      TempSrc:      SpO2: 99% 99% 97% 98%  Weight:      Height:        Intake/Output Summary (Last 24 hours) at 11/01/2017 1103 Last data filed at 11/01/2017 1006 Gross per 24 hour  Intake 2648.71 ml  Output 2408 ml  Net 240.71 ml   Filed Weights   11/12/2017 1047 10/31/17 0308 11/01/17 0455  Weight: 63.5 kg 63.7 kg 67.6 kg    Telemetry    Sinus with PACs, PVCs and PAF - Personally Reviewed  Physical Exam   GEN: Intubated and sedated. WD Neck: No JVE Cardiac: RRR, 3/6 systolic murmur Respiratory: rhonchi GI: Soft, no masses MS: No edema Neuro:  sedated  Labs    Chemistry Recent Labs  Lab 10/31/17 0519 10/31/17 1600 11/01/17 0418  NA 143  143 140 145  K 4.3  4.3 5.1 3.9  CL 115*  114* 108 102  CO2 17*  17* 21* 36*  GLUCOSE 96  94 110* 173*  BUN 50*  51* 30* 19  CREATININE 3.79*  3.78* 2.35* 1.63*  CALCIUM 8.0*  8.0* 7.8* 7.7*  ALBUMIN 2.4* 2.6* 2.5*  GFRNONAA 12*  12* 21* 33*  GFRAA 14*  14* 24* 38*  ANIONGAP '11  12 11 7     ' Hematology Recent Labs  Lab 11/03/2017 1907 10/31/17  0025 10/31/17 0519 11/01/17 0418  WBC 21.6*  --  11.3* 8.9  RBC 3.36*  --  3.15* 2.90*  HGB 8.7* 9.5* 8.3* 7.5*  HCT 29.2* 28.0* 25.9* 23.7*  MCV 86.9  --  82.2 81.7  MCH 25.9*  --  26.3 25.9*  MCHC 29.8*  --  32.0 31.6  RDW 22.9*  --  22.5* 22.5*  PLT 405*  --  269 176    Cardiac Enzymes Recent Labs  Lab 10/31/17 0008 10/31/17 0519 10/31/17 1208  TROPONINI 0.94* 1.24* 1.22*    Recent Labs  Lab 10/31/2017 1037  TROPIPOC 0.01     BNP Recent Labs  Lab 10/22/2017 1024  BNP >4,500.0*     DDimer  Recent Labs  Lab 11/01/2017 1024  DDIMER 14.31*     Radiology    Dg Chest Port 1 View  Result Date: 11/01/2017 CLINICAL DATA:  Adult respiratory distress syndrome, endotracheal tube EXAM: PORTABLE CHEST 1 VIEW COMPARISON:  Portable chest  x-ray from earlier in the day FINDINGS: There is little change in diffuse airspace disease. There may be tiny pleural effusions present. The tip of the endotracheal tube is approximately 5.5 cm above the carina. Right central venous line tip overlies the lower SVC and NG tube extends below the hemidiaphragm. Endovascular thoracic aortic stent remains with ectasia distally. Cardiomegaly is stable. IMPRESSION: 1. Endotracheal tube tip approximately 5.5 cm above the carina. 2. Diffuse airspace disease remains.  Cannot exclude tiny effusions. Electronically Signed   By: Ivar Drape M.D.   On: 11/01/2017 09:08   Dg Chest Port 1 View  Result Date: 11/01/2017 CLINICAL DATA:  Respiratory failure EXAM: PORTABLE CHEST 1 VIEW COMPARISON:  Portable chest x-ray of 10/29/2017 FINDINGS: Aeration has improved although there is persistent bilateral airspace disease primarily in the lower lobes. Endovascular stent remains throughout the thoracic aorta with ectasia distally again noted. The carina is very difficult to visualize due to overlying stent and mediastinal wires, but the tip of the endotracheal tube is approximately 5.6 cm above the carina. Right central venous line tip overlies the lower SVC and NG tube extends below the hemidiaphragm. Cardiomegaly is stable. IMPRESSION: 1. Improved aeration.  Persistent bilateral airspace disease. 2. Endotracheal tube at least 5.6 cm above the carina. Electronically Signed   By: Ivar Drape M.D.   On: 11/01/2017 09:07   Dg Chest Port 1 View  Result Date: 10/23/2017 CLINICAL DATA:  Central line placement EXAM: PORTABLE CHEST 1 VIEW COMPARISON:  11/04/2017, 09/08/2017, CT chest 09/08/2017 FINDINGS: Endotracheal tube tip overlies the right mainstem bronchus orifice. Right-sided central venous catheter tip over the cavoatrial junction. Worsening airspace disease in the right hemithorax. No change in diffuse airspace disease left hemithorax. Stable enlarged cardiomediastinal silhouette  with extensive aortic stent grafting. IMPRESSION: 1. Endotracheal tube tip encroaches right mainstem bronchus, this was subsequently repositioned on the follow-up film 2. Worsening right greater than left diffuse airspace disease which may reflect asymmetric edema or diffuse infection. Electronically Signed   By: Donavan Foil M.D.   On: 11/09/2017 20:18   Dg Chest Port 1 View  Result Date: 10/25/2017 CLINICAL DATA:  Endotracheal tube adjustment EXAM: PORTABLE CHEST 1 VIEW COMPARISON:  11/06/2017, 09/08/2017 FINDINGS: Post sternotomy changes and extensive aortic stent graft. Right-sided central venous catheter tip overlies the cavoatrial region. Endotracheal tube tip repositioned with tip now 1.9 cm superior to the carina. Hyperinflation with extensive right greater than left airspace disease, which may reflect asymmetric edema or diffuse infection. Soft tissue  density and mediastinal widening as before. IMPRESSION: 1. Endotracheal tube tip now 1.9 cm superior to the carina 2. Extensive right greater than left airspace disease which may reflect pulmonary edema or diffuse infection 3. Stable cardiomediastinal silhouette with enlarged mediastinal silhouette and extensive aortic stent grafting. Electronically Signed   By: Donavan Foil M.D.   On: 10/19/2017 20:17    Patient Profile     62 y.o. female with past medical history of prior aortic arch repair for thoracic aortic aneurysm, TEVAR with subsequent endoleak and more recent endovascular stent graft placement, hypertension, chronic stage IV kidney disease, COPD, chronic combined systolic/diastolic congestive heart failure admitted with respiratory failure requiring intubation.  Echocardiogram shows ejection fraction 40 to 45% with apical wall motion abnormality; moderate tricuspid regurgitation with moderate to severe pulmonary hypertension.  Troponin elevated.  Assessment & Plan    1 acute pulmonary edema-transthoracic echocardiogram shows worse LV  function and wall motion abnormality compared to previous.  Plan to continue aspirin and statin.  She is hypotensive requiring pressors.  We therefore cannot add additional cardiac medications.  As outlined previously she will likely require cardiac catheterization after she improves.  We will recycle enzymes.    2 acute on chronic stage IV kidney disease-patient is being dialyzed by nephrology.  If she is now end-stage renal disease then we will plan catheterization prior to discharge.  3 possible pneumonia-antibiotics per primary care.;  Cultures negative to date.  4 elevated troponin-possibly secondary to demand ischemia.  I will not add heparin at this point.  Recycle enzymes.  5 paroxysmal atrial fibrillation-noted on telemetry.  Presently in sinus rhythm.  CHADSvasc 5.  Will likely need to be anticoagulated once she is more stable.  Her hemoglobin has decreased and we need to follow prior to initiating.  6 bradycardia-she has intermittent sinus bradycardia but not clear that this is contributing to hypotension or clinical presentation.  We will continue to follow.  No indication for pacing.  For questions or updates, please contact [place] Please consult [email or web address] for contact info under        Signed, Kirk Ruths, MD  11/01/2017, 11:03 AM

## 2017-11-01 NOTE — Progress Notes (Signed)
[facility] Progress Note Patient Name: Diana Walters DOB: [birth year] MRN: [id number]   Date of Service  11/01/2017  HPI/Events of Note  ABG on 50%/PRVC 35/TV 350/P 10 = 7.156/67.8/95.0. Already on a NaHCO3 IV infusion.   eICU Interventions  Will order: 1. NaHCO3 200 meq IV now.  2. ABG at 5 AM.     Intervention Category Major Interventions: Acid-Base disturbance - evaluation and management;Respiratory failure - evaluation and management  Kamdyn Covel Cornelia Copa 11/01/2017, 2:47 AM

## 2017-11-01 NOTE — Progress Notes (Signed)
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 Admitted with hypoxemic respiratory failure, acute pulmonary edema, AoCKD, metabolic acidosis.  Started on BiPAP but failed and required intubation.  Did not respond to diuresis; therefore, started CRRT overnight.  9/15 admit, intubated 9/16 started on CRRT. Hypotensive with increasing pressor requirements, but BP actually higher by R femoral arterial line. Pressors weaned off. NMB started for dyssynchrony.  Subjective:  Continues to have episodes of bradycardia overnight.   Objective   Blood pressure (!) 73/58, pulse (!) 58, temperature (!) 92.7 F (33.7 C), temperature source Other (Comment), resp. rate (!) 35, height 5\' 7"  (1.702 m), weight 67.6 kg, SpO2 100 %. CVP:  [2 mmHg-12 mmHg] 2 mmHg  Vent Mode: PRVC FiO2 (%):  [50 %-100 %] 50 % Set Rate:  [24 bmp-35 bmp] 35 bmp Vt Set:  [350 mL-530 mL] 350 mL PEEP:  [8 cmH20-14 cmH20] 10 cmH20 Pressure Support:  [20 cmH20] 20 cmH20 Plateau Pressure:  [18 cmH20-27 cmH20] 18 cmH20   Intake/Output Summary (Last 24 hours) at 11/01/2017 0740 Last data filed at 11/01/2017 0700 Gross per 24 hour  Intake 2413.83 ml  Output 2328 ml  Net 85.83 ml   Filed Weights   11/04/2017 1047 10/31/17 0308 11/01/17 0455  Weight: 63.5 kg 63.7 kg 67.6 kg    Examination: General: Adult female chronically ill appearing, on vent  HENT: ETT and OGT in place with no pressure injury Lungs: Crackles throughout, no wheeze.  Pplat 19.   Cardiovascular: extremities warm HS  normal. Abdomen: Soft, distended, active bowel sounds.  Extremities: minimal edema. Neuro: Sedated with NMB, BIS acceptable at 46  Consults: date of consult/date signed off & final recs:  PCCM 9/15 > Renal 9/16 > started on CRRT.    Assessment & Plan:   Acute Hypoxic Respiratory Distress in setting of cardiogenic pulmonary edema secondary to decompensated heart failure with acute on chronic kidney disease vs ESRD + PNA. H/O COPD, Tobacco Use  Currently on lung protective ventilation with acceptable Vt, Driving pressure and Pplat.   Continue current ventilator strategy. Attempt fluid removal. Continue NMB for further 24h. Initiate CPT to encourage secretion clearance.  PNA. Normal WBC, positive PCT. Cultures negative.  Stop vancomycin as PCR negative. Continue Cefepime for 7days.  Shock - presumed multifactorial; septic in setting PNA and cardiogenic in setting decompensated heart failure (Echo from July 2019 with EF 50 - 55%, G2DD). Bradycardia - unclear etiology, could be due to acidosis  Elevated troponin - ? Demand. Hx AAA s/p stent graft July 2019, HTN, HLD. Plan  Continue levophed, goal MAP > 65. Start dopamine for heart rate. Bradycardia generally brief with no change in BP - manage conservatively should improve with correction of metabolic status and improvement in respiratory status.   Acute on Chronic Stage IV Kidney Disease vs Progression to ESRD. Anion Gap Metabolic Acidosis - improving with HCO3 and CRRT. Plan  Continue CRRT per Nephrology. Continue HCO3 for now. Follow BMP.  Anemia in setting of chronic disease. Plan  Transfuse for Hgb < 7. Follow CBC.  Hyperglycemia - no hx DM. Plan  SSI.  Sedation Needs due to mechanical ventilation. H/O CVA, RA. Plan  Sedation: fentanyl gtt / midazolam gtt to target BIS <60.  Disposition / Summary of Today's Plan 11/01/17   Continue lung protective ventilation and NMB, Attempt to remove fluid. Recovery will be  slow.    DVT prophylaxis: Heparin iv for CRRT GI prophylaxis: Pepcid  Diet: high nutritional risk. Start PEP-UP. Mobility:Bedrest  Code Status: Full Code  Family Communication: Family updated at bedside  Labs and ancillary test   CBC: improving leukocytosis Recent Labs  Lab 10/19/2017 1024 10/16/2017 1907 10/31/17 0025 10/31/17 0519 11/01/17 0418  WBC 17.5* 21.6*  --  11.3* 8.9  NEUTROABS 15.2*  --   --   --   --   HGB 9.5* 8.7* 9.5* 8.3* 7.5*  HCT 31.3* 29.2* 28.0* 25.9* 23.7*  MCV 86.5 86.9  --  82.2 81.7  PLT 376 405*  --  269 132   Basic Metabolic Panel: improving acidosis with effective dialysis. Recent Labs  Lab 10/27/2017 1907 11/01/2017 2048 10/31/17 0008 10/31/17 0025 10/31/17 0519 10/31/17 1600 11/01/17 0418  NA 141 141  --  144 143  143 140 145  K 5.1 4.4  --  4.9 4.3  4.3 5.1 3.9  CL 117* 115*  --  116* 115*  114* 108 102  CO2 12* 14*  --   --  17*  17* 21* 36*  GLUCOSE 154* 143*  --  114* 96  94 110* 173*  BUN 52* 54*  --  51* 50*  51* 30* 19  CREATININE 4.05* 3.98*  --  4.30* 3.79*  3.78* 2.35* 1.63*  CALCIUM 8.1* 8.3*  --   --  8.0*  8.0* 7.8* 7.7*  MG  --   --  2.2  --  2.2 2.4 2.4  PHOS  --   --   --   --  4.9* 3.4 3.2  3.2   GFR: Estimated Creatinine Clearance: 34.4 mL/min (A) (by C-G formula based on SCr of 1.63 mg/dL (H)). Recent Labs  Lab 11/04/2017 1024 11/02/2017 1907 11/03/2017 1948 10/29/2017 2248 10/31/17 0519 10/31/17 1934 11/01/17 0418  PROCALCITON  --   --  21.07  --  30.69  --  29.14  WBC 17.5* 21.6*  --   --  11.3*  --  8.9  LATICACIDVEN  --   --  2.1* 2.7*  --  3.2*  --    Persistent lactic acidosis due to impaired renal clearance.  Liver Function Tests: Recent Labs  Lab 10/31/17 0519 10/31/17 1600 11/01/17 0418  ALBUMIN 2.4* 2.6* 2.5*   No results for input(s): LIPASE, AMYLASE in the last 168 hours. No results for input(s): AMMONIA in the last 168 hours. ABG  Compensated respiratory acidosis.     Component Value  Date/Time   PHART 7.311 (L) 11/01/2017 0406   PCO2ART 68.2 (HH) 11/01/2017 0406   PO2ART 148.0 (H) 11/01/2017 0406   HCO3 35.7 (H) 11/01/2017 0406   TCO2 38 (H) 11/01/2017 0406   ACIDBASEDEF 5.0 (H) 11/01/2017 0005   O2SAT 99.0 11/01/2017 0406    Coagulation Profile: No results for input(s): INR, PROTIME in the last 168 hours. Cardiac Enzymes: consistent with demand/sepsis related cardiomyopathy Recent Labs  Lab 10/31/17 0008 10/31/17 0519 10/31/17 1208  TROPONINI 0.94* 1.24* 1.22*    CBG:  Stress hyperglycemia with adequate control.  Recent Labs  Lab 10/31/17 1116 10/31/17  1508 10/31/17 1927 10/31/17 2337 11/01/17 0347  GLUCAP 109* 105* 109* 111* 138*   CXR (personally reviewed) from 9/15: Diffuse consolidation of right lung.  Large gastric air bubble.  Micro:  Respiratory culture 9/15. Few WBC with mixed organisms. Blood cultures negative. MRSA PCR negative.  Respiratory panel negative.  CRITICAL CARE Performed by: Kipp Brood  Total critical care time: 45 minutes  Critical care time was exclusive of separately billable procedures and treating other patients.  Critical care was necessary to treat or prevent imminent or life-threatening deterioration.  Critical care was time spent personally by me on the following activities: development of treatment plan with patient and/or surrogate as well as nursing, discussions with consultants, evaluation of patient's response to treatment, examination of patient, obtaining history from patient or surrogate, ordering and performing treatments and interventions, ordering and review of laboratory studies, ordering and review of radiographic studies, pulse oximetry and re-evaluation of patient's condition.  Kipp Brood, MD [facility] ICU Physician [place]  Pager: [phone] Mobile: [phone] After hours: [phone].

## 2017-11-01 NOTE — Progress Notes (Signed)
Nutrition Follow-up  DOCUMENTATION CODES:   Non-severe (moderate) malnutrition in context of chronic illness  INTERVENTION:   Tube Feeding:  Vital AF 1.2 @ 55 ml/hr Provides 99 g of protein, 1584 kcals, 1069 mL of free water Meets 100% of protein needs, 97% calorie needs   NUTRITION DIAGNOSIS:   Moderate Malnutrition related to chronic illness(CKD IV with new right arm fistula, COPD, CHF) as evidenced by mild fat depletion, severe muscle depletion, edema.  Being addressed via TF   GOAL:   Patient will meet greater than or equal to 90% of their needs  Met  MONITOR:   TF tolerance, Vent status, Labs, Weight trends  REASON FOR ASSESSMENT:   Ventilator    ASSESSMENT:    62 yo female admitted with acute reapiratory failure with acute pulmonary edema requiring intubation, AKI on CKD started on CVVHD. PMH includes CVA, HLD, HTN, COPD, RA, CKD IV with new right arm fistula, CHF  9/15 Admit, Intubated 9/16 CRRT initiated, hypotensive but pressors weaned off, nimbex started for vent synchrony  TF changed to Pivot 1.5 @ 45 ml/hr last night by RPh/MD as Vital AF 1.2 not available. Vital AF 1.2 now restocked and plan to resume today. Pt tolerated initiation of TF yesterday  Labs: reviewed Meds: B-complex with Vitamin C, nimbex, fentanyl, versed  Diet Order:   Diet Order    None      EDUCATION NEEDS:   Not appropriate for education at this time  Skin:  Skin Assessment: Reviewed RN Assessment  Last BM:  9/16  Height:   Ht Readings from Last 1 Encounters:  10/31/17 '5\' 7"'  (1.702 m)    Weight:   Wt Readings from Last 1 Encounters:  11/01/17 67.6 kg    Ideal Body Weight:     BMI:  Body mass index is 23.34 kg/m.  Estimated Nutritional Needs:   Kcal:  3358 kcals   Protein:  96-127 g  Fluid:  >/= 1.2 L  Kerman Passey MS, RD, LDN, CNSC [phone] Pager  [phone] Weekend/On-Call Pager

## 2017-11-01 NOTE — Progress Notes (Signed)
S: Intubated/sedated O:BP (!) 70/39   Pulse (!) 47   Temp (!) 92.3 F (33.5 C) (Axillary) Comment: initiated warming blanket  Resp (!) 31   Ht '5\' 7"'$  (1.702 m)   Wt 67.6 kg   SpO2 98%   BMI 23.34 kg/m   Intake/Output Summary (Last 24 hours) at 11/01/2017 1200 Last data filed at 11/01/2017 1100 Gross per 24 hour  Intake 2562.25 ml  Output 2349 ml  Net 213.25 ml   Intake/Output: I/O last 3 completed shifts: In: 2893.1 [I.V.:1936.7; Other:33; NG/GT:553.9; IV Piggyback:369.4] Out: 4174 [Urine:228; Other:2323]  Intake/Output this shift:  Total I/O In: 582.6 [I.V.:252.6; NG/GT:230; IV Piggyback:100] Out: 624 [Urine:15; Other:609] Weight change: 4.097 kg Gen: critically ill appearing CVS: bradycardic Resp: bilateral crackles Abd: soft, NT Ext: trace edema, RUE BVT- + bruit, faint thrill  Recent Labs  Lab 11/07/2017 1024 10/16/2017 1615 10/27/2017 1907 10/19/2017 2048 10/31/17 0025 10/31/17 0519 10/31/17 1600 11/01/17 0418  NA 140 141 141 141 144 143  143 140 145  K 4.7 4.8 5.1 4.4 4.9 4.3  4.3 5.1 3.9  CL 114* 113* 117* 115* 116* 115*  114* 108 102  CO2 11* 12* 12* 14*  --  17*  17* 21* 36*  GLUCOSE 142* 144* 154* 143* 114* 96  94 110* 173*  BUN 47* 50* 52* 54* 51* 50*  51* 30* 19  CREATININE 3.73* 3.93* 4.05* 3.98* 4.30* 3.79*  3.78* 2.35* 1.63*  ALBUMIN  --   --   --   --   --  2.4* 2.6* 2.5*  CALCIUM 8.7* 8.6* 8.1* 8.3*  --  8.0*  8.0* 7.8* 7.7*  PHOS  --   --   --   --   --  4.9* 3.4 3.2  3.2   Liver Function Tests: Recent Labs  Lab 10/31/17 0519 10/31/17 1600 11/01/17 0418  ALBUMIN 2.4* 2.6* 2.5*   No results for input(s): LIPASE, AMYLASE in the last 168 hours. No results for input(s): AMMONIA in the last 168 hours. CBC: Recent Labs  Lab 11/08/2017 1024 10/31/2017 1907 10/31/17 0025 10/31/17 0519 11/01/17 0418  WBC 17.5* 21.6*  --  11.3* 8.9  NEUTROABS 15.2*  --   --   --   --   HGB 9.5* 8.7* 9.5* 8.3* 7.5*  HCT 31.3* 29.2* 28.0* 25.9* 23.7*  MCV  86.5 86.9  --  82.2 81.7  PLT 376 405*  --  269 176   Cardiac Enzymes: Recent Labs  Lab 10/31/17 0008 10/31/17 0519 10/31/17 1208  TROPONINI 0.94* 1.24* 1.22*   CBG: Recent Labs  Lab 10/31/17 1927 10/31/17 2337 11/01/17 0347 11/01/17 0804 11/01/17 1112  GLUCAP 109* 111* 138* 146* 128*    Iron Studies: No results for input(s): IRON, TIBC, TRANSFERRIN, FERRITIN in the last 72 hours. Studies/Results: Dg Chest Port 1 View  Result Date: 11/01/2017 CLINICAL DATA:  Adult respiratory distress syndrome, endotracheal tube EXAM: PORTABLE CHEST 1 VIEW COMPARISON:  Portable chest x-ray from earlier in the day FINDINGS: There is little change in diffuse airspace disease. There may be tiny pleural effusions present. The tip of the endotracheal tube is approximately 5.5 cm above the carina. Right central venous line tip overlies the lower SVC and NG tube extends below the hemidiaphragm. Endovascular thoracic aortic stent remains with ectasia distally. Cardiomegaly is stable. IMPRESSION: 1. Endotracheal tube tip approximately 5.5 cm above the carina. 2. Diffuse airspace disease remains.  Cannot exclude tiny effusions. Electronically Signed   By: Windy Canny.D.  On: 11/01/2017 09:08   Dg Chest Port 1 View  Result Date: 11/01/2017 CLINICAL DATA:  Respiratory failure EXAM: PORTABLE CHEST 1 VIEW COMPARISON:  Portable chest x-ray of 10/26/2017 FINDINGS: Aeration has improved although there is persistent bilateral airspace disease primarily in the lower lobes. Endovascular stent remains throughout the thoracic aorta with ectasia distally again noted. The carina is very difficult to visualize due to overlying stent and mediastinal wires, but the tip of the endotracheal tube is approximately 5.6 cm above the carina. Right central venous line tip overlies the lower SVC and NG tube extends below the hemidiaphragm. Cardiomegaly is stable. IMPRESSION: 1. Improved aeration.  Persistent bilateral airspace disease.  2. Endotracheal tube at least 5.6 cm above the carina. Electronically Signed   By: Ivar Drape M.D.   On: 11/01/2017 09:07   Dg Chest Port 1 View  Result Date: 10/20/2017 CLINICAL DATA:  Central line placement EXAM: PORTABLE CHEST 1 VIEW COMPARISON:  11/11/2017, 09/08/2017, CT chest 09/08/2017 FINDINGS: Endotracheal tube tip overlies the right mainstem bronchus orifice. Right-sided central venous catheter tip over the cavoatrial junction. Worsening airspace disease in the right hemithorax. No change in diffuse airspace disease left hemithorax. Stable enlarged cardiomediastinal silhouette with extensive aortic stent grafting. IMPRESSION: 1. Endotracheal tube tip encroaches right mainstem bronchus, this was subsequently repositioned on the follow-up film 2. Worsening right greater than left diffuse airspace disease which may reflect asymmetric edema or diffuse infection. Electronically Signed   By: Donavan Foil M.D.   On: 10/27/2017 20:18   Dg Chest Port 1 View  Result Date: 10/31/2017 CLINICAL DATA:  Endotracheal tube adjustment EXAM: PORTABLE CHEST 1 VIEW COMPARISON:  10/16/2017, 09/08/2017 FINDINGS: Post sternotomy changes and extensive aortic stent graft. Right-sided central venous catheter tip overlies the cavoatrial region. Endotracheal tube tip repositioned with tip now 1.9 cm superior to the carina. Hyperinflation with extensive right greater than left airspace disease, which may reflect asymmetric edema or diffuse infection. Soft tissue density and mediastinal widening as before. IMPRESSION: 1. Endotracheal tube tip now 1.9 cm superior to the carina 2. Extensive right greater than left airspace disease which may reflect pulmonary edema or diffuse infection 3. Stable cardiomediastinal silhouette with enlarged mediastinal silhouette and extensive aortic stent grafting. Electronically Signed   By: Donavan Foil M.D.   On: 10/31/2017 20:17   . artificial tears  1 application Both Eyes Z8H  . aspirin   81 mg Per Tube Daily  . atorvastatin  40 mg Per Tube Daily  . B-complex with vitamin C  1 tablet Per Tube Daily  . chlorhexidine gluconate (MEDLINE KIT)  15 mL Mouth Rinse BID  . Chlorhexidine Gluconate Cloth  6 each Topical q morning - 10a  . famotidine  10 mg Per Tube Daily  . insulin aspart  0-15 Units Subcutaneous Q4H  . mouth rinse  15 mL Mouth Rinse 10 times per day  . nicotine  21 mg Transdermal Daily    BMET    Component Value Date/Time   NA 145 11/01/2017 0418   NA 141 06/22/2017 1106   K 3.9 11/01/2017 0418   CL 102 11/01/2017 0418   CO2 36 (H) 11/01/2017 0418   GLUCOSE 173 (H) 11/01/2017 0418   BUN 19 11/01/2017 0418   BUN 31 (H) 06/22/2017 1106   CREATININE 1.63 (H) 11/01/2017 0418   CREATININE 1.10 06/10/2014 0813   CALCIUM 7.7 (L) 11/01/2017 0418   CALCIUM 7.6 (L) 06/09/2017 0230   GFRNONAA 33 (L) 11/01/2017 8850  GFRAA 38 (L) 11/01/2017 0418   CBC    Component Value Date/Time   WBC 8.9 11/01/2017 0418   RBC 2.90 (L) 11/01/2017 0418   HGB 7.5 (L) 11/01/2017 0418   HGB 10.3 (L) 06/22/2017 1106   HCT 23.7 (L) 11/01/2017 0418   HCT 31.8 (L) 06/22/2017 1106   PLT 176 11/01/2017 0418   PLT 304 06/22/2017 1106   MCV 81.7 11/01/2017 0418   MCV 77 (L) 06/22/2017 1106   MCH 25.9 (L) 11/01/2017 0418   MCHC 31.6 11/01/2017 0418   RDW 22.5 (H) 11/01/2017 0418   RDW 21.7 (H) 06/22/2017 1106   LYMPHSABS 1.6 10/27/2017 1024   LYMPHSABS 1.9 01/19/2017 1128   MONOABS 0.7 11/12/2017 1024   EOSABS 0.0 10/20/2017 1024   EOSABS 0.3 01/19/2017 1128   BASOSABS 0.0 10/17/2017 1024   BASOSABS 0.0 01/19/2017 1128   79F late stage CKD presenting with rapdily progressive hypoxic and hypercapneic RF.  Has a mixed respiratory and metabolic acidosis.  Appears not to be responding to high-dose diuretics.  Initiated CRRT 10/31/17 for control of acidosis and ultrafiltration.    Assessment/Plan:  1. AKI/CKD stage 4- now ESRD in setting of shock and pressor support.  Currently on  CVVHD but has had issues with hypotension.  Will decrease goal UF and follow 2. Acidosis- primarily respiratory with pCO2 of 79 and CO2 of 33 (had been given multiple doses of IV bicarb).  Vent changes per PCCM.  Stopped bicarb gtt. 3.  Shock- multifactoria: cardiogenic with decompensated CHF as well as sepsis from PNA.  On levophed and dopamine per PCCM 4. VDRF- per PCCM 5. Anemia- acute on chronic due to critical illness 6. Bradycardia- dopamine added 7. Disposition- poor prognosis given multi-organ failure and persistent hypotension despite pressor support.  Recommend palliative care consult to help set goals/limits of care.  Donetta Potts, MD [employer] [phone]

## 2017-11-01 NOTE — Progress Notes (Signed)
   11/01/17 1200  Clinical Encounter Type  Visited With Patient and family together  Visit Type Initial;Spiritual support  Referral From Nurse  Spiritual Encounters  Spiritual Needs Emotional;Grief support  Stress Factors  Family Stress Factors Exhausted   Followed up on previous Chaplains concern for the PT and family. As I entered the room, Nurse was caring for patient. PT was unresponsive. Family member (daughter) was talking about her time beside her mother. Family member was tired and emotional. She was in tears and was happy that I visited and shared words of encouragement. [employer] of presence and prayer. Chaplain available as needed.   Chaplain Fidel Levy

## 2017-11-01 NOTE — Progress Notes (Signed)
Aline dampened and not reading.  Attempt to pull a lab from line failed.  Using safe set, tried to clear line.  Not working and no pressure is reading.  Cbg=58  1 amp D50 given via purple port in femoral dialysis cath.  Will check in 15 minutes.  Calling MD about a line.   No pressors running only dopamine so may not need a line at this time

## 2017-11-02 DIAGNOSIS — R748 Abnormal levels of other serum enzymes: Secondary | ICD-10-CM

## 2017-11-02 LAB — CBC WITH DIFFERENTIAL/PLATELET
BASOS PCT: 0 %
Basophils Absolute: 0 10*3/uL (ref 0.0–0.1)
EOS PCT: 3 %
Eosinophils Absolute: 0.2 10*3/uL (ref 0.0–0.7)
HCT: 24.7 % — ABNORMAL LOW (ref 36.0–46.0)
Hemoglobin: 7.7 g/dL — ABNORMAL LOW (ref 12.0–15.0)
Lymphocytes Relative: 12 %
Lymphs Abs: 0.9 10*3/uL (ref 0.7–4.0)
MCH: 25.8 pg — ABNORMAL LOW (ref 26.0–34.0)
MCHC: 31.2 g/dL (ref 30.0–36.0)
MCV: 82.9 fL (ref 78.0–100.0)
MONOS PCT: 2 %
Monocytes Absolute: 0.1 10*3/uL (ref 0.1–1.0)
NEUTROS PCT: 83 %
Neutro Abs: 6.1 10*3/uL (ref 1.7–7.7)
PLATELETS: 183 10*3/uL (ref 150–400)
RBC: 2.98 MIL/uL — AB (ref 3.87–5.11)
RDW: 22.3 % — ABNORMAL HIGH (ref 11.5–15.5)
WBC: 7.3 10*3/uL (ref 4.0–10.5)

## 2017-11-02 LAB — RENAL FUNCTION PANEL
Albumin: 2.4 g/dL — ABNORMAL LOW (ref 3.5–5.0)
Albumin: 2.4 g/dL — ABNORMAL LOW (ref 3.5–5.0)
Anion gap: 7 (ref 5–15)
Anion gap: 8 (ref 5–15)
BUN: 8 mg/dL (ref 8–23)
BUN: 8 mg/dL (ref 8–23)
CALCIUM: 7.5 mg/dL — AB (ref 8.9–10.3)
CHLORIDE: 102 mmol/L (ref 98–111)
CO2: 31 mmol/L (ref 22–32)
CO2: 27 mmol/L (ref 22–32)
CREATININE: 0.91 mg/dL (ref 0.44–1.00)
CREATININE: 0.83 mg/dL (ref 0.44–1.00)
Calcium: 7.6 mg/dL — ABNORMAL LOW (ref 8.9–10.3)
Chloride: 102 mmol/L (ref 98–111)
GFR calc Af Amer: >60 mL/min (ref >60)
GFR calc non Af Amer: >60 mL/min (ref >60)
Glucose, Bld: 105 mg/dL — ABNORMAL HIGH (ref 70–99)
Glucose, Bld: 134 mg/dL — ABNORMAL HIGH (ref 70–99)
POTASSIUM: 3.7 mmol/L (ref 3.5–5.1)
Phosphorus: 1.5 mg/dL — ABNORMAL LOW (ref 2.5–4.6)
Phosphorus: 2.4 mg/dL — ABNORMAL LOW (ref 2.5–4.6)
Potassium: 4.2 mmol/L (ref 3.5–5.1)
SODIUM: 137 mmol/L (ref 135–145)
Sodium: 140 mmol/L (ref 135–145)

## 2017-11-02 LAB — GLUCOSE, CAPILLARY
GLUCOSE-CAPILLARY: 87 mg/dL (ref 70–99)
Glucose-Capillary: 106 mg/dL — ABNORMAL HIGH (ref 70–99)
Glucose-Capillary: 108 mg/dL — ABNORMAL HIGH (ref 70–99)
Glucose-Capillary: 143 mg/dL — ABNORMAL HIGH (ref 70–99)
Glucose-Capillary: 98 mg/dL (ref 70–99)
Glucose-Capillary: 110 mg/dL — ABNORMAL HIGH (ref 70–99)

## 2017-11-02 LAB — POCT I-STAT 3, ART BLOOD GAS (G3+)
Acid-Base Excess: 1 mmol/L (ref 0.0–2.0)
BICARBONATE: 27.9 mmol/L (ref 20.0–28.0)
O2 Saturation: 99 %
PCO2 ART: 55.1 mmHg — AB (ref 32.0–48.0)
Patient temperature: 97.5
TCO2: 30 mmol/L (ref 22–32)
pH, Arterial: 7.309 — ABNORMAL LOW (ref 7.350–7.450)
pO2, Arterial: 159 mmHg — ABNORMAL HIGH (ref 83.0–108.0)

## 2017-11-02 LAB — POCT ACTIVATED CLOTTING TIME
ACTIVATED CLOTTING TIME: 219 s
ACTIVATED CLOTTING TIME: 224 s
ACTIVATED CLOTTING TIME: 208 s
ACTIVATED CLOTTING TIME: 202 s
Activated Clotting Time: 213 seconds
Activated Clotting Time: 219 seconds
Activated Clotting Time: 224 seconds
Activated Clotting Time: 213 seconds
Activated Clotting Time: 208 seconds

## 2017-11-02 LAB — CULTURE, RESPIRATORY W GRAM STAIN: Culture: NORMAL

## 2017-11-02 LAB — CULTURE, RESPIRATORY

## 2017-11-02 LAB — TROPONIN I: TROPONIN I: 0.47 ng/mL — AB (ref <0.03)

## 2017-11-02 LAB — MAGNESIUM: MAGNESIUM: 2.4 mg/dL (ref 1.7–2.4)

## 2017-11-02 LAB — APTT: aPTT: 142 seconds — ABNORMAL HIGH (ref 24–36)

## 2017-11-02 LAB — PHOSPHORUS: Phosphorus: 1.4 mg/dL — ABNORMAL LOW (ref 2.5–4.6)

## 2017-11-02 MED ORDER — FENTANYL 2500MCG IN NS 250ML (10MCG/ML) PREMIX INFUSION
0.0000 ug/h | INTRAVENOUS | Status: DC
  Administered 2017-11-02: 200 ug/h via INTRAVENOUS
  Administered 2017-11-03: 150 ug/h via INTRAVENOUS
  Filled 2017-11-02 (×3): qty 250

## 2017-11-02 MED ORDER — DOCUSATE SODIUM 50 MG/5ML PO LIQD
50.0000 mg | Freq: Two times a day (BID) | ORAL | Status: DC
  Administered 2017-11-02 – 2017-11-03 (×3): 50 mg
  Filled 2017-11-02 (×3): qty 10

## 2017-11-02 MED ORDER — SODIUM CHLORIDE 0.9 % IV SOLN
0.0000 mg/h | INTRAVENOUS | Status: DC
  Filled 2017-11-02: qty 10

## 2017-11-02 MED ORDER — BISACODYL 10 MG RE SUPP: 10.0000 mg | Freq: Every day | RECTAL | Status: DC | PRN

## 2017-11-02 MED ORDER — HEPARIN SODIUM (PORCINE) 5000 UNIT/ML IJ SOLN
5000.0000 [IU] | Freq: Three times a day (TID) | INTRAMUSCULAR | Status: DC
  Administered 2017-11-02 – 2017-11-05 (×8): 5000 [IU] via SUBCUTANEOUS
  Filled 2017-11-02 (×9): qty 1

## 2017-11-02 MED ORDER — POTASSIUM PHOSPHATES 15 MMOLE/5ML IV SOLN
20.0000 mmol | Freq: Once | INTRAVENOUS | Status: AC
  Administered 2017-11-02: 20 mmol via INTRAVENOUS
  Filled 2017-11-02: qty 6.67

## 2017-11-02 MED ORDER — INSULIN ASPART 100 UNIT/ML ~~LOC~~ SOLN
0.0000 [IU] | SUBCUTANEOUS | Status: DC
  Administered 2017-11-02 – 2017-11-04 (×6): 1 [IU] via SUBCUTANEOUS

## 2017-11-02 MED ORDER — SODIUM CHLORIDE 0.9 % IV SOLN
0.0000 mg/h | INTRAVENOUS | Status: DC
  Administered 2017-11-02: 10 mg/h via INTRAVENOUS
  Administered 2017-11-02: 6 mg/h via INTRAVENOUS
  Filled 2017-11-02 (×2): qty 20

## 2017-11-02 MED ORDER — IPRATROPIUM-ALBUTEROL 0.5-2.5 (3) MG/3ML IN SOLN
3.0000 mL | Freq: Three times a day (TID) | RESPIRATORY_TRACT | Status: DC
  Administered 2017-11-02 – 2017-11-05 (×10): 3 mL via RESPIRATORY_TRACT
  Filled 2017-11-02 (×10): qty 3

## 2017-11-02 NOTE — Progress Notes (Signed)
Notified NP of inconsistencies with patient's blood pressure readings. Limited locations to obtain BP peripherally, currently located on right leg. Pt has palpable pedal pulses and a pulse ox reading of 100% on her right toe, suggesting adequate perfusion. Other VSS.  Will continue to monitor.

## 2017-11-02 NOTE — Progress Notes (Addendum)
Diana Walters  ZOX:[id number] DOB: 1955/12/31 DOA: 11/04/2017 PCP: Renato Shin, MD    LOS: 3 days   Reason for Consult / Chief Complaint:  Acute Respiratory Distress  Consulting MD and date:  TRH Dhungel, 9/15  HPI/Summary of hospital stay:  62 year old female with PMH of Tobacco Use, CVA, Anemia, HLD, Thoracic Aortic Aneurysm s/p endovascular stent graft 08/2017, anxiety, HTN, COPD, RA, CKD stage IV with new right arm fistula, Diastolic HF  Presents to ED On 9/15 with dyspnea. Admitted with hypoxemic respiratory failure, acute pulmonary edema, AoCKD, metabolic acidosis.  Started on BiPAP but failed and required intubation.  Did not respond to diuresis; therefore, started CRRT overnight.  9/15 admit, intubated 9/16 started on CRRT. Hypotensive with increasing pressor requirements, but BP actually higher by R femoral arterial line. Pressors weaned off. NMB started for dyssynchrony. 9/16 TTE >> - Left ventricle: The cavity size was normal. There was mild   concentric hypertrophy. Systolic function was mildly to   moderately reduced. The estimated ejection fraction was in the   range of 40% to 45%. Diffuse hypokinesis. There is hypokinesis of   the mid-apicalanteroseptal, inferolateral, and apical myocardium.   Features are consistent with a pseudonormal left ventricular   filling pattern, with concomitant abnormal relaxation and   increased filling pressure (grade 2 diastolic dysfunction).   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 - Ventricular septum: The contour showed diastolic flattening. - Aortic valve: There was mild regurgitation. - Mitral valve: There was trivial regurgitation. - Tricuspid valve: There was moderate regurgitation. - Pulmonary arteries: Systolic pressure was moderately to severely   increased. PA peak pressure: 66 mm Hg (S). Impressions:  - EF is reduced when compared to prior (50-55%)  9/17 dopamine started for episodes of  bradycardia; remains on CRRT/ NMB    Subjective:  Femoral Aline out, dampened and no waveform Unreliable BP readings with cuff Afebrile  No further bradycardia, on dopamine of 8 mcg/min, off levophed Hypoglycemic episode overnight Remains on NMB for dyssynchrony  UF remains at even balance 2/2 hypotension and pressor requirement Unchanged weight, net I/O +230ml, -60 ml/24hrs  Objective   Blood pressure (!) 125/33, pulse 85, temperature 97.6 F (36.4 C), temperature source Oral, resp. rate (!) 35, height 5\' 7"  (1.702 m), weight 67.8 kg, SpO2 100 %. CVP:  [8 mmHg-9 mmHg] 9 mmHg  Vent Mode: PRVC FiO2 (%):  [40 %] 40 % Set Rate:  [35 bmp] 35 bmp Vt Set:  [350 mL] 350 mL PEEP:  [10 cmH20] 10 cmH20 Plateau Pressure:  [25 cmH20-27 cmH20] 26 cmH20   Intake/Output Summary (Last 24 hours) at 11/02/2017 0827 Last data filed at 11/02/2017 0800 Gross per 24 hour  Intake 2867.44 ml  Output 2864 ml  Net 3.44 ml   Filed Weights   10/31/17 0308 11/01/17 0455 11/02/17 0500  Weight: 63.7 kg 67.6 kg 67.8 kg    Examination: General:  Critically ill female sedated on MV HEENT: MM pink/dry, pupils 3/reactive, ETT 20 at lip, OGT Neuro: sedated with NMB, versed and fentanyl, BIS 30-40's CV: RR, SR, murmur, +1 peripheral pulses, right groin site wnl PULM: even/non-labored on MV, lungs bilaterally clear anterior, scattered crackles in bases GI: soft, minimally distended, hypo BS  Extremities: warm/dry, anasarca Skin: no rashes   Consults: date of consult/date signed off & final recs:  PCCM 9/15 > Renal 9/16 > started on CRRT Cards 9/16  Assessment & Plan:   Acute Hypoxic Respiratory Distress  in setting of cardiogenic pulmonary edema secondary to decompensated heart failure with acute on chronic kidney disease vs ESRD + PNA. H/O COPD, Tobacco Use  - Current Pplat 27 on ~6 cc/ kg, PRVC, rate 35 P:  Continue full MV, monitoring pressures ABG now Stop NMB Goal is some fluid removal when  BP permits  Continue CPT for secretion clearance Follow sputum cx  See below for abx CXR in am   PNA - Normal WBC, positive PCT. Cultures negative.  - vancomycin stopped 9/17 with neg MRSA PCR P:  Continue Cefepime for 7days, stop date 9/21 Follow sputum cx   Shock - presumed multifactorial; septic in setting PNA and cardiogenic in setting decompensated heart failure (Echo from July 2019 with EF 50 - 55%, G2DD). Sinus Bradycardia - unclear etiology, could be due to acidosis  Elevated troponin - - stable trend in troponin 0.51 > 0.43 > 0.47, likely demand ischemia Hx AAA s/p stent graft July 2019, HTN, HLD PAF - TTE 9/16 with LVEF 40-45%, PAP 66 mm Hg Plan  Continue dopamine as needed for bradycardia and MAP goal > 65  Will reinsert aline - aline waveform adequate/ does not correlate with cuff pressures on leg or PE with palpable peripheral +1 pulses Place clear site to help determine hemodynamics  Consider co-ox /lactate (lactate cleared 9/17) ABG now to rule out metabolic derangements Cards following, will need to consider cardiac cath when stable and consider anticoagulation with CHADSvasc 5  Will attempt reinsertion of aline for better monitoring of BP Goal K > 4; Mag > 2  Acute on Chronic Stage IV Kidney Disease vs Progression to ESRD, remains anuric Anion Gap Metabolic Acidosis - improving on CRRT Hypophosphatemia  Plan  Continue CRRT per Nephrology Replace w/ Kphos 20 mmol Trend BMET  Anemia in setting of chronic disease. Plan  Transfuse for Hgb < 7. Follow CBC.  Hyperglycemia - no hx DM. Plan  SSI, decrease to sensitive   Sedation Needs due to mechanical ventilation. H/O CVA, RA. Plan  Sedation: fentanyl gtt / midazolam gtt to target BIS <60 while on NMB w/RASS goal -4/-5 Stop NMB, wean sedation for RASS goal -1/ vent synchrony  Add bowel regimen, scheduled colace and dulcolax prn   Disposition / Summary of Today's Plan 11/02/17    Stop NMB and wean  sedation for vent synchrony/ RASS -1 Add Bowel regimen Determine further hemodynamics to determine fluid removal. Continue CRRT     DVT prophylaxis: Heparin iv for CRRT, add SQ heparin, continue SCDs GI prophylaxis: Pepcid  Diet: high nutritional risk. TF at goal  Mobility: Bedrest  Code Status: Full Code  Family Communication: no family at bedside.  Will try and talk with daughter who is to return this afternoon.  Will need to start [place] discussion given chronic illnesses and poor progression with acute critical illness   Labs and ancillary test   CBC: improving leukocytosis Recent Labs  Lab 10/19/2017 1024 10/28/2017 1907 10/31/17 0025 10/31/17 0519 11/01/17 0418 11/02/17 0330  WBC 17.5* 21.6*  --  11.3* 8.9 7.3  NEUTROABS 15.2*  --   --   --   --  6.1  HGB 9.5* 8.7* 9.5* 8.3* 7.5* 7.7*  HCT 31.3* 29.2* 28.0* 25.9* 23.7* 24.7*  MCV 86.5 86.9  --  82.2 81.7 82.9  PLT 376 405*  --  269 176 798   Basic Metabolic Panel: improving acidosis with effective dialysis. Recent Labs  Lab 10/31/17 0519 10/31/17 1600 11/01/17 0418 11/01/17 1111  11/01/17 1600 11/02/17 0330  NA 143  143 140 145 141 141 140  K 4.3  4.3 5.1 3.9 3.8 4.0 3.7  CL 115*  114* 108 102 103 105 102  CO2 17*  17* 21* 36* 29 28 31   GLUCOSE 96  94 110* 173* 138* 95 105*  BUN 50*  51* 30* 19 14 12 8   CREATININE 3.79*  3.78* 2.35* 1.63* 1.26* 1.11* 0.91  CALCIUM 8.0*  8.0* 7.8* 7.7* 7.3* 7.3* 7.6*  MG 2.2 2.4 2.4  --  2.4 2.4  PHOS 4.9* 3.4 3.2  3.2  --  2.3* 1.4*  1.5*   GFR: Estimated Creatinine Clearance: 61.5 mL/min (by C-G formula based on SCr of 0.91 mg/dL). Recent Labs  Lab 10/16/2017 1907 10/26/2017 1948 11/08/2017 2248 10/31/17 0519 10/31/17 1934 11/01/17 0418 11/01/17 0747 11/02/17 0330  PROCALCITON  --  21.07  --  30.69  --  29.14  --   --   WBC 21.6*  --   --  11.3*  --  8.9  --  7.3  LATICACIDVEN  --  2.1* 2.7*  --  3.2*  --  1.1  --    Persistent lactic acidosis due to impaired renal  clearance.  Liver Function Tests: Recent Labs  Lab 10/31/17 0519 10/31/17 1600 11/01/17 0418 11/01/17 1600 11/02/17 0330  ALBUMIN 2.4* 2.6* 2.5* 2.5* 2.4*   No results for input(s): LIPASE, AMYLASE in the last 168 hours. No results for input(s): AMMONIA in the last 168 hours. ABG  Compensated respiratory acidosis.     Component Value Date/Time   PHART 7.311 (L) 11/01/2017 0406   PCO2ART 68.2 (HH) 11/01/2017 0406   PO2ART 148.0 (H) 11/01/2017 0406   HCO3 35.7 (H) 11/01/2017 0406   TCO2 38 (H) 11/01/2017 0406   ACIDBASEDEF 5.0 (H) 11/01/2017 0005   O2SAT 99.0 11/01/2017 0406    Coagulation Profile: No results for input(s): INR, PROTIME in the last 168 hours. Cardiac Enzymes: consistent with demand/sepsis related cardiomyopathy Recent Labs  Lab 10/31/17 0519 10/31/17 1208 11/01/17 1111 11/01/17 1600 11/01/17 2319  TROPONINI 1.24* 1.22* 0.51* 0.43* 0.47*    CBG:  Stress hyperglycemia with adequate control.  Recent Labs  Lab 11/01/17 1933 11/01/17 2015 11/01/17 2325 11/02/17 0345 11/02/17 0723  GLUCAP 58* 141* 80 87 106*   CXR (personally reviewed) from 9/15: Diffuse consolidation of right lung.  Large gastric air bubble.  Micro: 9/15 BC >> ngtd 9/15 Respiratory culture >>Few WBC with mixed organisms >> 9/15 MRSA PCR negative 9/16 Respiratory panel negative  CCT: 60 mins  Kennieth Rad, AGACNP-BC [facility] Pgr: [phone] or if no answer [phone] 11/02/2017, 8:42 AM

## 2017-11-02 NOTE — Progress Notes (Signed)
Progress Note  Patient Name: Diana Walters Date of Encounter: 11/02/2017  Primary Cardiologist: Dorris Carnes, MD   Subjective   Intubated; Sedated  Inpatient Medications    Scheduled Meds: . aspirin  81 mg Per Tube Daily  . atorvastatin  40 mg Per Tube Daily  . B-complex with vitamin C  1 tablet Per Tube Daily  . chlorhexidine gluconate (MEDLINE KIT)  15 mL Mouth Rinse BID  . Chlorhexidine Gluconate Cloth  6 each Topical q morning - 10a  . docusate  50 mg Per Tube BID  . famotidine  20 mg Per Tube Daily  . heparin injection (subcutaneous)  5,000 Units Subcutaneous Q8H  . insulin aspart  0-9 Units Subcutaneous Q4H  . mouth rinse  15 mL Mouth Rinse 10 times per day  . nicotine  21 mg Transdermal Daily   Continuous Infusions: . sodium chloride 10 mL/hr at 11/02/17 1000  . sodium chloride 10 mL/hr at 10/31/17 0700  . ceFEPime (MAXIPIME) IV Stopped (11/02/17 9892)  . DOPamine 8 mcg/kg/min (11/02/17 1000)  . feeding supplement (VITAL AF 1.2 CAL) 55 mL/hr at 11/02/17 1000  . fentaNYL infusion INTRAVENOUS    . heparin 10,000 units/ 20 mL infusion syringe 1,200 Units/hr (11/02/17 1000)  . heparin    . midazolam (VERSED) infusion 10 mg/hr (11/02/17 1030)  . norepinephrine (LEVOPHED) Adult infusion Stopped (11/01/17 1157)  . potassium PHOSPHATE IVPB (in mmol)    . dialysis replacement fluid (prismasate) 1,200 mL/hr at 11/02/17 0734  . dialysis replacement fluid (prismasate) 500 mL/hr at 11/02/17 0940  . dialysate (PRISMASATE) 1,500 mL/hr at 11/02/17 1019  . sodium chloride 999 mL/hr at 10/31/17 0900   PRN Meds: sodium chloride, Place/Maintain arterial line **AND** sodium chloride, acetaminophen **OR** acetaminophen, albuterol, bisacodyl, fentaNYL (SUBLIMAZE) injection, fentaNYL (SUBLIMAZE) injection, heparin, heparin, heparin, midazolam, sodium chloride   Vital Signs    Vitals:   11/02/17 0813 11/02/17 0814 11/02/17 0900 11/02/17 1000  BP: (!) 125/33  (!) 130/26 (!) 131/38    Pulse: 85  83 82  Resp: (!) 35  (!) 35 (!) 31  Temp:      TempSrc:      SpO2: 100% 100% 100% 100%  Weight:      Height:        Intake/Output Summary (Last 24 hours) at 11/02/2017 1035 Last data filed at 11/02/2017 1000 Gross per 24 hour  Intake 2810.38 ml  Output 2837 ml  Net -26.62 ml   Filed Weights   10/31/17 0308 11/01/17 0455 11/02/17 0500  Weight: 63.7 kg 67.6 kg 67.8 kg    Telemetry    Sinus- Personally Reviewed  Physical Exam   GEN: Intubated and sedated Neck: No JVD Cardiac: RRR, 2/6 systolic murmur Respiratory: mild rhonchi GI: Soft, not distended MS: trace to 1+ edema lower ext; 3 + edema LUE Neuro:  sedated; not assessed  Labs    Chemistry Recent Labs  Lab 11/01/17 0418 11/01/17 1111 11/01/17 1600 11/02/17 0330  NA 145 141 141 140  K 3.9 3.8 4.0 3.7  CL 102 103 105 102  CO2 36* _0 GLUCOSE 173* 138* 95 105*  BUN _1 CREATININE 1.63* 1.26* 1.11* 0.91  CALCIUM 7.7* 7.3* 7.3* 7.6*  ALBUMIN 2.5*  --  2.5* 2.4*  GFRNONAA 33* 44* 52* >60  GFRAA 38* 51* 60* >60  ANIONGAP _2 Hematology Recent Labs  Lab 10/31/17 0519 11/01/17 0418 11/02/17 0330  WBC 11.3* 8.9 7.3  RBC 3.15* 2.90* 2.98*  HGB 8.3* 7.5* 7.7*  HCT 25.9* 23.7* 24.7*  MCV 82.2 81.7 82.9  MCH 26.3 25.9* 25.8*  MCHC 32.0 31.6 31.2  RDW 22.5* 22.5* 22.3*  PLT 269 176 183    Cardiac Enzymes Recent Labs  Lab 10/31/17 1208 11/01/17 1111 11/01/17 1600 11/01/17 2319  TROPONINI 1.22* 0.51* 0.43* 0.47*    Recent Labs  Lab 11/04/2017 1037  TROPIPOC 0.01     BNP Recent Labs  Lab 11/03/2017 1024  BNP >4,500.0*     DDimer  Recent Labs  Lab 11/08/2017 1024  DDIMER 14.31*     Radiology    Dg Chest Port 1 View  Result Date: 11/01/2017 CLINICAL DATA:  Adult respiratory distress syndrome, endotracheal tube EXAM: PORTABLE CHEST 1 VIEW COMPARISON:  Portable chest x-ray from earlier in the day FINDINGS: There is little change in diffuse airspace  disease. There may be tiny pleural effusions present. The tip of the endotracheal tube is approximately 5.5 cm above the carina. Right central venous line tip overlies the lower SVC and NG tube extends below the hemidiaphragm. Endovascular thoracic aortic stent remains with ectasia distally. Cardiomegaly is stable. IMPRESSION: 1. Endotracheal tube tip approximately 5.5 cm above the carina. 2. Diffuse airspace disease remains.  Cannot exclude tiny effusions. Electronically Signed   By: Ivar Drape M.D.   On: 11/01/2017 09:08   Dg Chest Port 1 View  Result Date: 11/01/2017 CLINICAL DATA:  Respiratory failure EXAM: PORTABLE CHEST 1 VIEW COMPARISON:  Portable chest x-ray of 10/16/2017 FINDINGS: Aeration has improved although there is persistent bilateral airspace disease primarily in the lower lobes. Endovascular stent remains throughout the thoracic aorta with ectasia distally again noted. The carina is very difficult to visualize due to overlying stent and mediastinal wires, but the tip of the endotracheal tube is approximately 5.6 cm above the carina. Right central venous line tip overlies the lower SVC and NG tube extends below the hemidiaphragm. Cardiomegaly is stable. IMPRESSION: 1. Improved aeration.  Persistent bilateral airspace disease. 2. Endotracheal tube at least 5.6 cm above the carina. Electronically Signed   By: Ivar Drape M.D.   On: 11/01/2017 09:07    Patient Profile     62 y.o. female with past medical history of prior aortic arch repair for thoracic aortic aneurysm, TEVAR with subsequent endoleak and more recent endovascular stent graft placement, hypertension, chronic stage IV kidney disease, COPD, chronic combined systolic/diastolic congestive heart failure admitted with respiratory failure requiring intubation.  Echocardiogram shows ejection fraction 40 to 45% with apical wall motion abnormality; moderate tricuspid regurgitation with moderate to severe pulmonary hypertension.  Troponin  elevated.  Assessment & Plan    1 acute pulmonary edema-as outlined previously transthoracic echocardiogram shows worse LV function this admission compared to previous.  We will continue with aspirin and statin.  She remains on dopamine and I therefore will not add beta blockade.  She was also bradycardic previously.  If she improves and aggressive measures felt indicated she will ultimately require cardiac catheterization assuming renal disease is felt to be end-stage.   2 acute on chronic stage IV kidney disease-dialysis per nephrology  3 possible pneumonia-Antibiotics per CCM  4 elevated troponin-felt likely related to demand ischemia.  However there is a wall motion abnormality on echocardiogram.  We will plan catheterization if she improves.  5 paroxysmal atrial fibrillation-previously noted on telemetry.  Presently in sinus rhythm.  CHADSvasc 5.  Continue SQ heparin for now.  6 bradycardia-resolved at present  For questions or updates, please contact [facility] Please consult [email or web address] for contact info under        Signed, Kirk Ruths, MD  11/02/2017, 10:35 AM

## 2017-11-02 NOTE — Procedures (Signed)
Arterial Catheter Insertion Procedure Note Diana Walters [id number] 12-20-1955  Procedure: Insertion of Arterial Catheter  Indications: Blood pressure monitoring and Frequent blood sampling  Procedure Details Consent: Unable to obtain consent because of altered level of consciousness. Time Out: Verified patient identification, verified procedure, site/side was marked, verified correct patient position, special equipment/implants available, medications/allergies/relevent history reviewed, required imaging and test results available.  Performed  Maximum sterile technique was used including antiseptics, cap, gloves, gown, hand hygiene, mask and sheet. Skin prep: Chlorhexidine 20 gauge catheter was inserted into left radial artery using the Seldinger technique. ULTRASOUND GUIDANCE USED: YES Evaluation Blood flow good; BP tracing good. Complications: No apparent complications.  Kennieth Rad, AGACNP-BC [facility] Pgr: [phone] or if no answer [phone] 11/02/2017, 9:32 AM

## 2017-11-02 NOTE — Progress Notes (Signed)
S: Having issues with art line and BP readings.  Otherwise tolerating CVVHD  O:BP (!) 160/28   Pulse 78   Temp 97.6 F (36.4 C) (Oral)   Resp (!) 35   Ht 5' 7" (1.702 m)   Wt 67.8 kg   SpO2 100%   BMI 23.41 kg/m   Intake/Output Summary (Last 24 hours) at 11/02/2017 1141 Last data filed at 11/02/2017 1100 Gross per 24 hour  Intake 2812.2 ml  Output 2837 ml  Net -24.8 ml   Intake/Output: I/O last 3 completed shifts: In: 4433.4 [I.V.:2391.1; NG/GT:1742.3; IV Piggyback:300.1] Out: 4036 [Urine:35; Other:4001]  Intake/Output this shift:  Total I/O In: 532.1 [I.V.:203.2; NG/GT:220; IV Piggyback:108.9] Out: 538 [Other:538] Weight change: 0.2 kg Gen:intubated and sedated CVS: no rub Resp: occ rhonchi bilaterally Abd: +BS, soft Ext: no edema, RUE AVF +T/B  Recent Labs  Lab 11/03/2017 2048 10/31/17 0025 10/31/17 0519 10/31/17 1600 11/01/17 0418 11/01/17 1111 11/01/17 1600 11/02/17 0330  NA 141 144 143  143 140 145 141 141 140  K 4.4 4.9 4.3  4.3 5.1 3.9 3.8 4.0 3.7  CL 115* 116* 115*  114* 108 102 103 105 102  CO2 14*  --  17*  17* 21* 36* 29 28 31  GLUCOSE 143* 114* 96  94 110* 173* 138* 95 105*  BUN 54* 51* 50*  51* 30* 19 14 12 8  CREATININE 3.98* 4.30* 3.79*  3.78* 2.35* 1.63* 1.26* 1.11* 0.91  ALBUMIN  --   --  2.4* 2.6* 2.5*  --  2.5* 2.4*  CALCIUM 8.3*  --  8.0*  8.0* 7.8* 7.7* 7.3* 7.3* 7.6*  PHOS  --   --  4.9* 3.4 3.2  3.2  --  2.3* 1.4*  1.5*   Liver Function Tests: Recent Labs  Lab 11/01/17 0418 11/01/17 1600 11/02/17 0330  ALBUMIN 2.5* 2.5* 2.4*   No results for input(s): LIPASE, AMYLASE in the last 168 hours. No results for input(s): AMMONIA in the last 168 hours. CBC: Recent Labs  Lab 11/10/2017 1024 10/25/2017 1907  10/31/17 0519 11/01/17 0418 11/02/17 0330  WBC 17.5* 21.6*  --  11.3* 8.9 7.3  NEUTROABS 15.2*  --   --   --   --  6.1  HGB 9.5* 8.7*   < > 8.3* 7.5* 7.7*  HCT 31.3* 29.2*   < > 25.9* 23.7* 24.7*  MCV 86.5 86.9  --  82.2  81.7 82.9  PLT 376 405*  --  269 176 183   < > = values in this interval not displayed.   Cardiac Enzymes: Recent Labs  Lab 10/31/17 0519 10/31/17 1208 11/01/17 1111 11/01/17 1600 11/01/17 2319  TROPONINI 1.24* 1.22* 0.51* 0.43* 0.47*   CBG: Recent Labs  Lab 11/01/17 2015 11/01/17 2325 11/02/17 0345 11/02/17 0723 11/02/17 1106  GLUCAP 141* 80 87 106* 108*    Iron Studies: No results for input(s): IRON, TIBC, TRANSFERRIN, FERRITIN in the last 72 hours. Studies/Results: Dg Chest Port 1 View  Result Date: 11/01/2017 CLINICAL DATA:  Adult respiratory distress syndrome, endotracheal tube EXAM: PORTABLE CHEST 1 VIEW COMPARISON:  Portable chest x-ray from earlier in the day FINDINGS: There is little change in diffuse airspace disease. There may be tiny pleural effusions present. The tip of the endotracheal tube is approximately 5.5 cm above the carina. Right central venous line tip overlies the lower SVC and NG tube extends below the hemidiaphragm. Endovascular thoracic aortic stent remains with ectasia distally. Cardiomegaly is stable. IMPRESSION: 1. Endotracheal   tube tip approximately 5.5 cm above the carina. 2. Diffuse airspace disease remains.  Cannot exclude tiny effusions. Electronically Signed   By: Paul  Barry M.D.   On: 11/01/2017 09:08   Dg Chest Port 1 View  Result Date: 11/01/2017 CLINICAL DATA:  Respiratory failure EXAM: PORTABLE CHEST 1 VIEW COMPARISON:  Portable chest x-ray of 10/31/2017 FINDINGS: Aeration has improved although there is persistent bilateral airspace disease primarily in the lower lobes. Endovascular stent remains throughout the thoracic aorta with ectasia distally again noted. The carina is very difficult to visualize due to overlying stent and mediastinal wires, but the tip of the endotracheal tube is approximately 5.6 cm above the carina. Right central venous line tip overlies the lower SVC and NG tube extends below the hemidiaphragm. Cardiomegaly is  stable. IMPRESSION: 1. Improved aeration.  Persistent bilateral airspace disease. 2. Endotracheal tube at least 5.6 cm above the carina. Electronically Signed   By: Paul  Barry M.D.   On: 11/01/2017 09:07   . aspirin  81 mg Per Tube Daily  . atorvastatin  40 mg Per Tube Daily  . B-complex with vitamin C  1 tablet Per Tube Daily  . chlorhexidine gluconate (MEDLINE KIT)  15 mL Mouth Rinse BID  . Chlorhexidine Gluconate Cloth  6 each Topical q morning - 10a  . docusate  50 mg Per Tube BID  . famotidine  20 mg Per Tube Daily  . heparin injection (subcutaneous)  5,000 Units Subcutaneous Q8H  . insulin aspart  0-9 Units Subcutaneous Q4H  . mouth rinse  15 mL Mouth Rinse 10 times per day  . nicotine  21 mg Transdermal Daily    BMET    Component Value Date/Time   NA 140 11/02/2017 0330   NA 141 06/22/2017 1106   K 3.7 11/02/2017 0330   CL 102 11/02/2017 0330   CO2 31 11/02/2017 0330   GLUCOSE 105 (H) 11/02/2017 0330   BUN 8 11/02/2017 0330   BUN 31 (H) 06/22/2017 1106   CREATININE 0.91 11/02/2017 0330   CREATININE 1.10 06/10/2014 0813   CALCIUM 7.6 (L) 11/02/2017 0330   CALCIUM 7.6 (L) 06/09/2017 0230   GFRNONAA >60 11/02/2017 0330   GFRAA >60 11/02/2017 0330   CBC    Component Value Date/Time   WBC 7.3 11/02/2017 0330   RBC 2.98 (L) 11/02/2017 0330   HGB 7.7 (L) 11/02/2017 0330   HGB 10.3 (L) 06/22/2017 1106   HCT 24.7 (L) 11/02/2017 0330   HCT 31.8 (L) 06/22/2017 1106   PLT 183 11/02/2017 0330   PLT 304 06/22/2017 1106   MCV 82.9 11/02/2017 0330   MCV 77 (L) 06/22/2017 1106   MCH 25.8 (L) 11/02/2017 0330   MCHC 31.2 11/02/2017 0330   RDW 22.3 (H) 11/02/2017 0330   RDW 21.7 (H) 06/22/2017 1106   LYMPHSABS 0.9 11/02/2017 0330   LYMPHSABS 1.9 01/19/2017 1128   MONOABS 0.1 11/02/2017 0330   EOSABS 0.2 11/02/2017 0330   EOSABS 0.3 01/19/2017 1128   BASOSABS 0.0 11/02/2017 0330   BASOSABS 0.0 01/19/2017 1128    61F late stage CKD presenting with rapdily progressive  hypoxic and hypercapneic RF.Has a mixed respiratory and metabolic acidosis. Did not respond to high-dose diuretics. Initiated CRRT 10/31/17 for control of acidosis and ultrafiltration.    Assessment/Plan:  1. AKI/CKD stage 4- remains anuric and is now ESRD in setting of shock and pressor support.  Currently on CVVHD but has had issues with hypotension.  tolerating for now and may   need to increase goal UF (has been even overnight)  2. Acidosis- primarily respiratory with pCO2 of 79 and CO2 of 33 (had been given multiple doses of IV bicarb).  Vent changes per PCCM.  Stopped bicarb gtt. 3.  Shock- multifactoria: cardiogenic with decompensated CHF as well as sepsis from PNA.  Off levophed but still on dopamine per PCCM 4. VDRF- per PCCM 5. Anemia- acute on chronic due to critical illness 6. Bradycardia- dopamine added 7. Disposition- poor prognosis given multi-organ failure and persistent hypotension despite pressor support and lack of improvement clinically.  Recommend palliative care consult to help set goals/limits of care.  Donetta Potts, MD [employer] [phone]

## 2017-11-03 ENCOUNTER — Inpatient Hospital Stay (HOSPITAL_COMMUNITY): Payer: Medicare Other

## 2017-11-03 LAB — CBC
HCT: 25.5 % — ABNORMAL LOW (ref 36.0–46.0)
HEMOGLOBIN: 7.9 g/dL — AB (ref 12.0–15.0)
MCH: 25.6 pg — ABNORMAL LOW (ref 26.0–34.0)
MCHC: 31 g/dL (ref 30.0–36.0)
MCV: 82.8 fL (ref 78.0–100.0)
Platelets: 181 10*3/uL (ref 150–400)
RBC: 3.08 MIL/uL — AB (ref 3.87–5.11)
RDW: 22.8 % — ABNORMAL HIGH (ref 11.5–15.5)
WBC: 11.3 10*3/uL — AB (ref 4.0–10.5)

## 2017-11-03 LAB — POCT ACTIVATED CLOTTING TIME
ACTIVATED CLOTTING TIME: 208 s
ACTIVATED CLOTTING TIME: 213 s
ACTIVATED CLOTTING TIME: 219 s
Activated Clotting Time: 202 seconds
Activated Clotting Time: 213 seconds

## 2017-11-03 LAB — RENAL FUNCTION PANEL
ANION GAP: 8 (ref 5–15)
Albumin: 2.2 g/dL — ABNORMAL LOW (ref 3.5–5.0)
Albumin: 2.4 g/dL — ABNORMAL LOW (ref 3.5–5.0)
Anion gap: 7 (ref 5–15)
BUN: 9 mg/dL (ref 8–23)
BUN: 9 mg/dL (ref 8–23)
CHLORIDE: 106 mmol/L (ref 98–111)
CO2: 24 mmol/L (ref 22–32)
CO2: 23 mmol/L (ref 22–32)
Calcium: 7.4 mg/dL — ABNORMAL LOW (ref 8.9–10.3)
Calcium: 7.7 mg/dL — ABNORMAL LOW (ref 8.9–10.3)
Chloride: 105 mmol/L (ref 98–111)
Creatinine, Ser: 0.72 mg/dL (ref 0.44–1.00)
Creatinine, Ser: 0.77 mg/dL (ref 0.44–1.00)
GFR calc Af Amer: >60 mL/min (ref >60)
GFR calc Af Amer: >60 mL/min (ref >60)
GFR calc non Af Amer: >60 mL/min (ref >60)
Glucose, Bld: 112 mg/dL — ABNORMAL HIGH (ref 70–99)
Glucose, Bld: 137 mg/dL — ABNORMAL HIGH (ref 70–99)
POTASSIUM: 3.8 mmol/L (ref 3.5–5.1)
Phosphorus: <1 mg/dL — CL (ref 2.5–4.6)
Phosphorus: 2.1 mg/dL — ABNORMAL LOW (ref 2.5–4.6)
Potassium: 3.6 mmol/L (ref 3.5–5.1)
Sodium: 136 mmol/L (ref 135–145)
Sodium: 137 mmol/L (ref 135–145)

## 2017-11-03 LAB — MAGNESIUM: MAGNESIUM: 2.5 mg/dL — AB (ref 1.7–2.4)

## 2017-11-03 LAB — POCT I-STAT 3, ART BLOOD GAS (G3+)
Acid-Base Excess: 3 mmol/L — ABNORMAL HIGH (ref 0.0–2.0)
Acid-base deficit: 1 mmol/L (ref 0.0–2.0)
Bicarbonate: 27.9 mmol/L (ref 20.0–28.0)
Bicarbonate: 25 mmol/L (ref 20.0–28.0)
O2 SAT: 100 %
O2 Saturation: 99 %
PCO2 ART: 40.7 mmHg (ref 32.0–48.0)
PCO2 ART: 46.7 mmHg (ref 32.0–48.0)
Patient temperature: 97.79
TCO2: 29 mmol/L (ref 22–32)
TCO2: 26 mmol/L (ref 22–32)
pH, Arterial: 7.441 (ref 7.350–7.450)
pH, Arterial: 7.335 — ABNORMAL LOW (ref 7.350–7.450)
pO2, Arterial: 164 mmHg — ABNORMAL HIGH (ref 83.0–108.0)
pO2, Arterial: 155 mmHg — ABNORMAL HIGH (ref 83.0–108.0)

## 2017-11-03 LAB — BLOOD GAS, ARTERIAL
Acid-base deficit: 1.7 mmol/L (ref 0.0–2.0)
Bicarbonate: 23.8 mmol/L (ref 20.0–28.0)
Drawn by: 414221
FIO2: 40
MECHVT: 350 mL
O2 Saturation: 99.1 %
PEEP: 10 cmH2O
Patient temperature: 94.1
RATE: 35 resp/min
pCO2 arterial: 44.7 mmHg (ref 32.0–48.0)
pH, Arterial: 7.331 — ABNORMAL LOW (ref 7.350–7.450)
pO2, Arterial: 147 mmHg — ABNORMAL HIGH (ref 83.0–108.0)

## 2017-11-03 LAB — GLUCOSE, CAPILLARY
GLUCOSE-CAPILLARY: 97 mg/dL (ref 70–99)
Glucose-Capillary: 102 mg/dL — ABNORMAL HIGH (ref 70–99)
Glucose-Capillary: 109 mg/dL — ABNORMAL HIGH (ref 70–99)
Glucose-Capillary: 129 mg/dL — ABNORMAL HIGH (ref 70–99)
Glucose-Capillary: 127 mg/dL — ABNORMAL HIGH (ref 70–99)

## 2017-11-03 LAB — APTT

## 2017-11-03 MED ORDER — CHLORHEXIDINE GLUCONATE 0.12 % MT SOLN
OROMUCOSAL | Status: AC
  Filled 2017-11-03: qty 15

## 2017-11-03 MED ORDER — POTASSIUM PHOSPHATES 15 MMOLE/5ML IV SOLN
30.0000 mmol | Freq: Once | INTRAVENOUS | Status: AC
  Administered 2017-11-03: 30 mmol via INTRAVENOUS
  Filled 2017-11-03 (×2): qty 10

## 2017-11-03 NOTE — Progress Notes (Signed)
Pharmacy Antibiotic Note  Diana Walters is a 62 y.o. female admitted on 11/10/2017 with pneumonia.  Pharmacy has been consulted for Cefepime dosing.   The patient's WBC count is slightly above normal at 11.3, but she remains afebrile. Her procalcitonin and lactic acid levels have also trending down from peak levels. Her SCr is down to 0.7 on CRRT since 0430 on 9/16 (baseline SCr ~3.7 with CrCl ~ 15 mL/min). There have been no interruptions in CRRT to necessitate antibiotic therapy adjustment. Will plan to continue cefepime for another 2 days (7 days total LOT).   Plan: Continue cefepime 2g IV q12h Monitor renal function, culture results, and clinical status.    Height: 5\' 7"  (170.2 cm) Weight: 145 lb 15.1 oz (66.2 kg) IBW/kg (Calculated) : 61.6  Temp (24hrs), Avg:96.5 F (35.8 C), Min:94.5 F (34.7 C), Max:97.8 F (36.6 C)  Recent Labs  Lab 11/11/2017 1907 10/21/2017 1948  10/20/2017 2248  10/31/17 0519  10/31/17 1934 11/01/17 0418 11/01/17 0747 11/01/17 1111 11/01/17 1600 11/02/17 0330 11/02/17 1615 11/03/17 0412  WBC 21.6*  --   --   --   --  11.3*  --   --  8.9  --   --   --  7.3  --  11.3*  CREATININE 4.05*  --    < >  --    < > 3.79*  3.78*   < >  --  1.63*  --  1.26* 1.11* 0.91 0.83 0.72  LATICACIDVEN  --  2.1*  --  2.7*  --   --   --  3.2*  --  1.1  --   --   --   --   --    < > = values in this interval not displayed.    Estimated Creatinine Clearance: 70 mL/min (by C-G formula based on SCr of 0.72 mg/dL).    Allergies  Allergen Reactions  . Ibuprofen Other (See Comments)    Can't take due to medications  PT DOESN'T RECALL THIS ALLERGY  . Codeine Other (See Comments)    Hallucinations  PT DOESN'T RECALL THIS ALLERGY    Antimicrobials this admission: Vancomycin 9/15 >> 9/17 Cefepime 9/15>> (9/21)  Microbiology results: 9/15 BCx x2: ngtd 9/15 BCx x2 : ngtd 9/15 trach asp Cx: normal flora 9/15 Strep urinary Ag: neg 9/15 MRSA PCR: neg 9/16 RVP:  neg  Thank you for allowing pharmacy to be a part of this patient's care.  Brendolyn Patty, PharmD PGY1 Pharmacy Resident Phone [phone]  11/03/2017   8:48 AM

## 2017-11-03 NOTE — Progress Notes (Addendum)
Diana Walters  OFH:[id number] DOB: 17-Aug-1955 DOA: 10/23/2017 PCP: Renato Shin, MD    LOS: 4 days   Reason for Consult / Chief Complaint:  Acute Respiratory Distress  Consulting MD and date:  TRH Dhungel, 9/15  HPI/Summary of hospital stay:  62 year old female with PMH of Tobacco Use, CVA, Anemia, HLD, Thoracic Aortic Aneurysm s/p endovascular stent graft 08/2017, anxiety, HTN, COPD, RA, CKD stage IV with new right arm fistula, Diastolic HF  Presents to ED On 9/15 with dyspnea. Admitted with hypoxemic respiratory failure, acute pulmonary edema, AoCKD, metabolic acidosis.  Started on BiPAP but failed and required intubation.  Did not respond to diuresis; therefore, started CRRT overnight.  9/15 admit, intubated 9/16 started on CRRT. Hypotensive with increasing pressor requirements, but BP actually higher by R femoral arterial line. Pressors weaned off. NMB started for dyssynchrony. 9/16 TTE >> - Left ventricle: The cavity size was normal. There was mild   concentric hypertrophy. Systolic function was mildly to   moderately reduced. The estimated ejection fraction was in the   range of 40% to 45%. Diffuse hypokinesis. There is hypokinesis of   the mid-apicalanteroseptal, inferolateral, and apical myocardium.   Features are consistent with a pseudonormal left ventricular   filling pattern, with concomitant abnormal relaxation and   increased filling pressure (grade 2 diastolic dysfunction).   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 - Ventricular septum: The contour showed diastolic flattening. - Aortic valve: There was mild regurgitation. - Mitral valve: There was trivial regurgitation. - Tricuspid valve: There was moderate regurgitation. - Pulmonary arteries: Systolic pressure was moderately to severely   increased. PA peak pressure: 66 mm Hg (S). Impressions:  - EF is reduced when compared to prior (50-55%)  9/17 dopamine started for episodes of  bradycardia; remains on CRRT/ NMB 9/18 discordance in cuff vs aline pressures; remains on dopamine for bradycardia, off other pressors; NMB off, UF kept even   Subjective:  Started on levophed overnight; as aline pressures being used Remains on dopamine for HR UF remains even Sedation increased for vent dyssynchrony overnight hypophos being replaced  Objective   Blood pressure (!) 145/49, pulse 86, temperature (!) 97.3 F (36.3 C), temperature source Axillary, resp. rate (!) 35, height 5\' 7"  (1.702 m), weight 66.2 kg, SpO2 100 %. CVP:  [8 mmHg-9 mmHg] 8 mmHg  Vent Mode: PRVC FiO2 (%):  [40 %] 40 % Set Rate:  [35 bmp] 35 bmp Vt Set:  [350 mL] 350 mL PEEP:  [10 cmH20] 10 cmH20 Plateau Pressure:  [25 cmH20-28 cmH20] 28 cmH20   Intake/Output Summary (Last 24 hours) at 11/03/2017 0826 Last data filed at 11/03/2017 5498 Gross per 24 hour  Intake 3226.97 ml  Output 3415 ml  Net -188.03 ml   Filed Weights   11/01/17 0455 11/02/17 0500 11/03/17 0456  Weight: 67.6 kg 67.8 kg 66.2 kg    Examination: General:  Critically ill female in NAD on MV HEENT: [place]/AT, ETT at 20 at lip->advanced to 22, OGT, pupils 3/reactive, scleral edema Neuro: RASS -4/-5, coughs only with stimulation CV: SR, systolic murmur, +2ME pulses, AVF RUE +T PULM: even/non-labored on MV, lungs bilaterally clear, minimal secretions, some difficulty passing ballard to suction GI: soft, hypoBS Extremities: warm/dry, anasarca Skin: no rashes   Consults: date of consult/date signed off & final recs:  PCCM 9/15 > Renal 9/16 > started on CRRT Cards 9/16  Assessment & Plan:   Acute Hypoxic Respiratory Distress in setting of cardiogenic  pulmonary edema secondary to decompensated heart failure with acute on chronic kidney disease vs ESRD + PNA. H/O COPD, Tobacco Use  - stable CXR 9/19 P:  Continue full MV, PRVC, increasing TV, decreasing rate to keep same MVe/ Pplat to help with comfort/ vent dyssynchrony/ weaning  sedation  Repeat ABG 1 hour Continue CPT/ bronch hygiene BD prn Fluid removal per CRRT per renal as BP permits sputum cx- normal flora  CXR in am   PNA - Normal WBC, positive PCT. Cultures negative thus far - vancomycin stopped 9/17 with neg MRSA PCR P:  Continue Cefepime for 7days, stop date 9/21 sputum cx neg  Shock - presumed multifactorial; septic in setting PNA and cardiogenic in setting decompensated heart failure (Echo from July 2019 with EF 50 - 55%, G2DD). Sinus Bradycardia - unclear etiology, ? Related to acidosis  Elevated troponin - - stable trend in troponin 0.51 > 0.43 > 0.47, likely demand ischemia Hx AAA s/p stent graft July 2019, HTN, HLD PAF - TTE 9/16 with LVEF 40-45%, PAP 66 mm Hg - unable to assess further hemodynamics yesterday with clear site Plan  - Wean levophed off per cuff pressures SBP > 90/ MAP > 65; aline for ABG only to assess after vent changes and then will d/c, ABG waveform and reading inaccurate, as patient had distal pulses and likley due to vasculopathy  - half dopamine dose to 4 mcg and monitor for bradycardia, will titrate off after levophed off - consider anticoagulation per Cards recs for CHADSvasc 5  - Goal K > 4; Mag > 2  Acute on Chronic Stage IV Kidney Disease vs Progression to ESRD, remains anuric Anion Gap Metabolic Acidosis - resolved  Hypophosphatemia  Plan  Continue CRRT per Nephrology S/p Kphos 30 mmol BMET BID on CRRT  Anemia in setting of chronic disease. - stable h/h Plan  Transfuse for Hgb < 7. Follow CBC.  Hyperglycemia - no hx DM. Plan  SSI,  sensitive  CBG q 4  Sedation Needs due to mechanical ventilation. H/O CVA, RA. Plan  PAD protocol with fentanyl and versed.  Will attempt to wean these as much as possible today; vent settings changed to permit RASS goal -1/ vent synchrony Daily wake up assessments   Constipation - unclear last BM, likely related to sedation P:  Continue scheduled colace Dulcolax  prn Minimize narcotics Goal K> 4  Disposition / Summary of Today's Plan 11/03/17   Continue CRRT, ? Allow some UF as cuff BP permits Go by cuff BP, d/c aline Wean levophed off; then dopamine and monitor HR Continue bowel regimen Wean sedation     DVT prophylaxis: Heparin iv for CRRT, add SQ heparin, continue SCDs GI prophylaxis: Pepcid  Diet: high nutritional risk. TF at goal  Mobility: Bedrest  Code Status: Full Code  Family Communication: no family yesterday or this am.  Granddaughter is on her way per RN.  Will need to start longterm [place] discussions.  Called daughter, Vanita Ingles at [phone], no voicemail or answer.  Granddaughter at [phone], Beckie Busing contacted and stated she was coming up this afternoon and gave new number for Vanita Ingles, [phone].   Labs and ancillary test    Recent Labs  Lab 10/24/2017 1024 10/31/2017 1907 10/31/17 0025 10/31/17 0519 11/01/17 0418 11/02/17 0330 11/03/17 0412  WBC 17.5* 21.6*  --  11.3* 8.9 7.3 11.3*  NEUTROABS 15.2*  --   --   --   --  6.1  --   HGB 9.5* 8.7* 9.5*  8.3* 7.5* 7.7* 7.9*  HCT 31.3* 29.2* 28.0* 25.9* 23.7* 24.7* 25.5*  MCV 86.5 86.9  --  82.2 81.7 82.9 82.8  PLT 376 405*  --  269 176 183 161   Basic Metabolic Panel: improving acidosis with effective dialysis. Recent Labs  Lab 10/31/17 1600 11/01/17 0418 11/01/17 1111 11/01/17 1600 11/02/17 0330 11/02/17 1615 11/03/17 0412  NA 140 145 141 141 140 137 136  K 5.1 3.9 3.8 4.0 3.7 4.2 3.6  CL 108 102 103 105 102 102 105  CO2 21* 36* 29 28 31 27 24   GLUCOSE 110* 173* 138* 95 105* 134* 112*  BUN 30* 19 14 12 8 8 9   CREATININE 2.35* 1.63* 1.26* 1.11* 0.91 0.83 0.72  CALCIUM 7.8* 7.7* 7.3* 7.3* 7.6* 7.5* 7.4*  MG 2.4 2.4  --  2.4 2.4  --  2.5*  PHOS 3.4 3.2  3.2  --  2.3* 1.4*  1.5* 2.4* <1.0*   GFR: Estimated Creatinine Clearance: 70 mL/min (by C-G formula based on SCr of 0.72 mg/dL). Recent Labs  Lab 11/11/2017 1948 10/19/2017 2248 10/31/17 0519 10/31/17 1934  11/01/17 0418 11/01/17 0747 11/02/17 0330 11/03/17 0412  PROCALCITON 21.07  --  30.69  --  29.14  --   --   --   WBC  --   --  11.3*  --  8.9  --  7.3 11.3*  LATICACIDVEN 2.1* 2.7*  --  3.2*  --  1.1  --   --    Persistent lactic acidosis due to impaired renal clearance.  Liver Function Tests: Recent Labs  Lab 11/01/17 0418 11/01/17 1600 11/02/17 0330 11/02/17 1615 11/03/17 0412  ALBUMIN 2.5* 2.5* 2.4* 2.4* 2.2*   No results for input(s): LIPASE, AMYLASE in the last 168 hours. No results for input(s): AMMONIA in the last 168 hours. ABG  Compensated respiratory acidosis.     Component Value Date/Time   PHART 7.331 (L) 11/03/2017 0500   PCO2ART 44.7 11/03/2017 0500   PO2ART 147 (H) 11/03/2017 0500   HCO3 23.8 11/03/2017 0500   TCO2 30 11/02/2017 0936   ACIDBASEDEF 1.7 11/03/2017 0500   O2SAT 99.1 11/03/2017 0500    Coagulation Profile: No results for input(s): INR, PROTIME in the last 168 hours. Cardiac Enzymes: consistent with demand/sepsis related cardiomyopathy Recent Labs  Lab 10/31/17 0519 10/31/17 1208 11/01/17 1111 11/01/17 1600 11/01/17 2319  TROPONINI 1.24* 1.22* 0.51* 0.43* 0.47*    CBG:  Stress hyperglycemia with adequate control.  Recent Labs  Lab 11/02/17 1526 11/02/17 1945 11/02/17 2324 11/03/17 0403 11/03/17 0757  GLUCAP 143* 98 110* 102* 97   CXR (personally reviewed) from 9/15: Diffuse consolidation of right lung.  Large gastric air bubble.  Micro: 9/15 BC >> ngtd 9/15 Respiratory culture >> normal flora 9/15 MRSA PCR negative 9/16 Respiratory panel negative  CCT: 60 mins  Kennieth Rad, AGACNP-BC [facility] Pgr: [phone] or if no answer [phone] 11/03/2017, 8:26 AM

## 2017-11-03 NOTE — Progress Notes (Signed)
Progress Note  Patient Name: Diana Walters Date of Encounter: 11/03/2017  Primary Cardiologist: Dorris Carnes, MD   Subjective   Remains intubated and sedated  Inpatient Medications    Scheduled Meds: . aspirin  81 mg Per Tube Daily  . atorvastatin  40 mg Per Tube Daily  . B-complex with vitamin C  1 tablet Per Tube Daily  . chlorhexidine gluconate (MEDLINE KIT)  15 mL Mouth Rinse BID  . Chlorhexidine Gluconate Cloth  6 each Topical q morning - 10a  . docusate  50 mg Per Tube BID  . famotidine  20 mg Per Tube Daily  . heparin injection (subcutaneous)  5,000 Units Subcutaneous Q8H  . insulin aspart  0-9 Units Subcutaneous Q4H  . ipratropium-albuterol  3 mL Nebulization TID  . mouth rinse  15 mL Mouth Rinse 10 times per day   Continuous Infusions: . sodium chloride 10 mL/hr at 11/03/17 0900  . sodium chloride 10 mL/hr at 10/31/17 0700  . ceFEPime (MAXIPIME) IV Stopped (11/03/17 0846)  . DOPamine 4 mcg/kg/min (11/03/17 0900)  . feeding supplement (VITAL AF 1.2 CAL) 55 mL/hr at 11/03/17 0700  . fentaNYL infusion INTRAVENOUS 150 mcg/hr (11/03/17 0900)  . heparin 10,000 units/ 20 mL infusion syringe 1,100 Units/hr (11/03/17 0900)  . heparin    . midazolam (VERSED) infusion 5 mg/hr (11/03/17 0905)  . norepinephrine (LEVOPHED) Adult infusion 3 mcg/min (11/03/17 0900)  . potassium PHOSPHATE IVPB (in mmol) 85 mL/hr at 11/03/17 0900  . dialysis replacement fluid (prismasate) 1,200 mL/hr at 11/03/17 0930  . dialysis replacement fluid (prismasate) 500 mL/hr at 11/03/17 0625  . dialysate (PRISMASATE) 1,500 mL/hr at 11/03/17 0636  . sodium chloride 999 mL/hr at 10/31/17 0900   PRN Meds: sodium chloride, Place/Maintain arterial line **AND** sodium chloride, acetaminophen **OR** acetaminophen, albuterol, bisacodyl, fentaNYL (SUBLIMAZE) injection, fentaNYL (SUBLIMAZE) injection, heparin, heparin, heparin, midazolam, sodium chloride   Vital Signs    Vitals:   11/03/17 0830 11/03/17  0845 11/03/17 0900 11/03/17 0930  BP: 129/66 132/66 134/61 (!) 131/53  Pulse: 85 87 87 87  Resp: (!) 24 (!) 24 (!) 24 (!) 24  Temp:      TempSrc:      SpO2: 100% 100% 100% 100%  Weight:      Height:        Intake/Output Summary (Last 24 hours) at 11/03/2017 1007 Last data filed at 11/03/2017 0900 Gross per 24 hour  Intake 3186.9 ml  Output 3260 ml  Net -73.1 ml   Filed Weights   11/01/17 0455 11/02/17 0500 11/03/17 0456  Weight: 67.6 kg 67.8 kg 66.2 kg    Telemetry    Sinus with occasional PVC- Personally Reviewed  Physical Exam   GEN: Intubated and sedated WD Neck: JVP difficult to assess Cardiac: RRR, 2/6 systolic murmur; no diastolic murmur Respiratory: CTA anteriorly GI: Soft, no massess MS: trace to 1+ edema lower ext; 3 + edema LUE Neuro:  pt is sedated  Labs    Chemistry Recent Labs  Lab 11/02/17 0330 11/02/17 1615 11/03/17 0412  NA 140 137 136  K 3.7 4.2 3.6  CL 102 102 105  CO2 31 27 24   GLUCOSE 105* 134* 112*  BUN 8 8 9   CREATININE 0.91 0.83 0.72  CALCIUM 7.6* 7.5* 7.4*  ALBUMIN 2.4* 2.4* 2.2*  GFRNONAA >60 >60 >60  GFRAA >60 >60 >60  ANIONGAP 7 8 7      Hematology Recent Labs  Lab 11/01/17 0418 11/02/17 0330 11/03/17 5329  WBC 8.9 7.3 11.3*  RBC 2.90* 2.98* 3.08*  HGB 7.5* 7.7* 7.9*  HCT 23.7* 24.7* 25.5*  MCV 81.7 82.9 82.8  MCH 25.9* 25.8* 25.6*  MCHC 31.6 31.2 31.0  RDW 22.5* 22.3* 22.8*  PLT 176 183 181    Cardiac Enzymes Recent Labs  Lab 10/31/17 1208 11/01/17 1111 11/01/17 1600 11/01/17 2319  TROPONINI 1.22* 0.51* 0.43* 0.47*    Recent Labs  Lab 10/29/2017 1037  TROPIPOC 0.01     BNP Recent Labs  Lab 10/21/2017 1024  BNP >4,500.0*     DDimer  Recent Labs  Lab 10/22/2017 1024  DDIMER 14.31*     Radiology    Dg Chest Port 1 View  Result Date: 11/03/2017 CLINICAL DATA:  Respiratory failure. EXAM: PORTABLE CHEST 1 VIEW COMPARISON:  11/01/2017. FINDINGS: Endotracheal tube, NG tube, right subclavian line  stable position. Prior median sternotomy and CABG. Heart size stable. Aortic stent graft in stable position. Previously identified bilateral pulmonary infiltrates/edema has cleared. No prominent pleural effusion or pneumothorax. No acute bony abnormality. IMPRESSION: 1.  Lines and tubes stable position. 2. Prior CABG. Aortic stent graft in stable position. Heart size stable. 3. No acute cardiopulmonary disease identified. Bilateral pulmonary infiltrates/edema has cleared. Electronically Signed   By: Marcello Moores  Register   On: 11/03/2017 09:28    Patient Profile     62 y.o. female with past medical history of prior aortic arch repair for thoracic aortic aneurysm, TEVAR with subsequent endoleak and more recent endovascular stent graft placement, hypertension, chronic stage IV kidney disease, COPD, chronic combined systolic/diastolic congestive heart failure admitted with respiratory failure requiring intubation.  Echocardiogram shows ejection fraction 40 to 45% with apical wall motion abnormality; moderate tricuspid regurgitation with moderate to severe pulmonary hypertension.  Troponin elevated.  Assessment & Plan    1 acute pulmonary edema-as outlined previously transthoracic echocardiogram shows worse LV function this admission compared to previous.  We will continue with aspirin and statin. Cuff BP and a-line do not correlate. Agree with weaning pressors based on cuff. If she improves and aggressive measures felt indicated she will ultimately require cardiac catheterization assuming renal disease is felt to be end-stage.   2 acute on chronic stage IV kidney disease-dialysis per nephrology  3 possible pneumonia-Antibiotics per CCM  4 elevated troponin-felt likely related to demand ischemia.  However there is a wall motion abnormality on echocardiogram.  We will plan catheterization if she improves.  5 paroxysmal atrial fibrillation-previously noted on telemetry.  Presently in sinus rhythm.  CHADSvasc  5.  Continue SQ heparin for now.  6 bradycardia-resolved  Apparently scheduled for family meeting later to discuss aggressiveness of care.  For questions or updates, please contact [place] Please consult [email or web address] for contact info under        Signed, Kirk Ruths, MD  11/03/2017, 10:07 AM

## 2017-11-03 NOTE — Progress Notes (Signed)
[facility] Progress Note Patient Name: Diana Walters DOB: 07-Aug-1955 MRN: [id number]   Date of Service  11/03/2017  HPI/Events of Note  Hypophos  eICU Interventions  Phos replaced     Intervention Category Intermediate Interventions: Electrolyte abnormality - evaluation and management  Diana Walters 11/03/2017, 6:00 AM

## 2017-11-03 NOTE — Progress Notes (Signed)
CRITICAL VALUE ALERT  Critical Value:  APTT >200  Date & Time Notied:  11/03/17 1111  Provider Notified: Jennelle Human NP  Orders Received/Actions taken: No new orders

## 2017-11-03 NOTE — Progress Notes (Signed)
S: No events overnight O:BP (!) 155/33   Pulse 81   Temp 97.7 F (36.5 C) (Axillary)   Resp 20   Ht '5\' 7"'  (1.702 m)   Wt 66.2 kg   SpO2 99%   BMI 22.86 kg/m   Intake/Output Summary (Last 24 hours) at 11/03/2017 1219 Last data filed at 11/03/2017 1100 Gross per 24 hour  Intake 3246.74 ml  Output 3284 ml  Net -37.26 ml   Intake/Output: I/O last 3 completed shifts: In: 1245 [I.V.:1896; NG/GT:1980; IV Piggyback:828] Out: 8099 [Other:4779]  Intake/Output this shift:  Total I/O In: 690 [I.V.:170.7; NG/GT:220; IV Piggyback:299.3] Out: 613 [Other:613] Weight change: -1.6 kg Gen: intubated/sedated CVS: no rub Resp: occ rhonchi Abd: benign Ext: 1+ edema, RUE AVF +T/B  Recent Labs  Lab 10/31/17 0519 10/31/17 1600 11/01/17 0418 11/01/17 1111 11/01/17 1600 11/02/17 0330 11/02/17 1615 11/03/17 0412  NA 143  143 140 145 141 141 140 137 136  K 4.3  4.3 5.1 3.9 3.8 4.0 3.7 4.2 3.6  CL 115*  114* 108 102 103 105 102 102 105  CO2 17*  17* 21* 36* '29 28 31 27 24  ' GLUCOSE 96  94 110* 173* 138* 95 105* 134* 112*  BUN 50*  51* 30* '19 14 12 8 8 9  ' CREATININE 3.79*  3.78* 2.35* 1.63* 1.26* 1.11* 0.91 0.83 0.72  ALBUMIN 2.4* 2.6* 2.5*  --  2.5* 2.4* 2.4* 2.2*  CALCIUM 8.0*  8.0* 7.8* 7.7* 7.3* 7.3* 7.6* 7.5* 7.4*  PHOS 4.9* 3.4 3.2  3.2  --  2.3* 1.4*  1.5* 2.4* <1.0*   Liver Function Tests: Recent Labs  Lab 11/02/17 0330 11/02/17 1615 11/03/17 0412  ALBUMIN 2.4* 2.4* 2.2*   No results for input(s): LIPASE, AMYLASE in the last 168 hours. No results for input(s): AMMONIA in the last 168 hours. CBC: Recent Labs  Lab 10/20/2017 1024 10/29/2017 1907  10/31/17 0519 11/01/17 0418 11/02/17 0330 11/03/17 0412  WBC 17.5* 21.6*  --  11.3* 8.9 7.3 11.3*  NEUTROABS 15.2*  --   --   --   --  6.1  --   HGB 9.5* 8.7*   < > 8.3* 7.5* 7.7* 7.9*  HCT 31.3* 29.2*   < > 25.9* 23.7* 24.7* 25.5*  MCV 86.5 86.9  --  82.2 81.7 82.9 82.8  PLT 376 405*  --  269 176 183 181   < > =  values in this interval not displayed.   Cardiac Enzymes: Recent Labs  Lab 10/31/17 0519 10/31/17 1208 11/01/17 1111 11/01/17 1600 11/01/17 2319  TROPONINI 1.24* 1.22* 0.51* 0.43* 0.47*   CBG: Recent Labs  Lab 11/02/17 1526 11/02/17 1945 11/02/17 2324 11/03/17 0403 11/03/17 0757  GLUCAP 143* 98 110* 102* 97    Iron Studies: No results for input(s): IRON, TIBC, TRANSFERRIN, FERRITIN in the last 72 hours. Studies/Results: Dg Chest Port 1 View  Result Date: 11/03/2017 CLINICAL DATA:  Respiratory failure. EXAM: PORTABLE CHEST 1 VIEW COMPARISON:  11/01/2017. FINDINGS: Endotracheal tube, NG tube, right subclavian line stable position. Prior median sternotomy and CABG. Heart size stable. Aortic stent graft in stable position. Previously identified bilateral pulmonary infiltrates/edema has cleared. No prominent pleural effusion or pneumothorax. No acute bony abnormality. IMPRESSION: 1.  Lines and tubes stable position. 2. Prior CABG. Aortic stent graft in stable position. Heart size stable. 3. No acute cardiopulmonary disease identified. Bilateral pulmonary infiltrates/edema has cleared. Electronically Signed   By: Marcello Moores  Register   On: 11/03/2017 09:28   .  aspirin  81 mg Per Tube Daily  . atorvastatin  40 mg Per Tube Daily  . B-complex with vitamin C  1 tablet Per Tube Daily  . chlorhexidine gluconate (MEDLINE KIT)  15 mL Mouth Rinse BID  . Chlorhexidine Gluconate Cloth  6 each Topical q morning - 10a  . docusate  50 mg Per Tube BID  . famotidine  20 mg Per Tube Daily  . heparin injection (subcutaneous)  5,000 Units Subcutaneous Q8H  . insulin aspart  0-9 Units Subcutaneous Q4H  . ipratropium-albuterol  3 mL Nebulization TID  . mouth rinse  15 mL Mouth Rinse 10 times per day    BMET    Component Value Date/Time   NA 136 11/03/2017 0412   NA 141 06/22/2017 1106   K 3.6 11/03/2017 0412   CL 105 11/03/2017 0412   CO2 24 11/03/2017 0412   GLUCOSE 112 (H) 11/03/2017 0412   BUN  9 11/03/2017 0412   BUN 31 (H) 06/22/2017 1106   CREATININE 0.72 11/03/2017 0412   CREATININE 1.10 06/10/2014 0813   CALCIUM 7.4 (L) 11/03/2017 0412   CALCIUM 7.6 (L) 06/09/2017 0230   GFRNONAA >60 11/03/2017 0412   GFRAA >60 11/03/2017 0412   CBC    Component Value Date/Time   WBC 11.3 (H) 11/03/2017 0412   RBC 3.08 (L) 11/03/2017 0412   HGB 7.9 (L) 11/03/2017 0412   HGB 10.3 (L) 06/22/2017 1106   HCT 25.5 (L) 11/03/2017 0412   HCT 31.8 (L) 06/22/2017 1106   PLT 181 11/03/2017 0412   PLT 304 06/22/2017 1106   MCV 82.8 11/03/2017 0412   MCV 77 (L) 06/22/2017 1106   MCH 25.6 (L) 11/03/2017 0412   MCHC 31.0 11/03/2017 0412   RDW 22.8 (H) 11/03/2017 0412   RDW 21.7 (H) 06/22/2017 1106   LYMPHSABS 0.9 11/02/2017 0330   LYMPHSABS 1.9 01/19/2017 1128   MONOABS 0.1 11/02/2017 0330   EOSABS 0.2 11/02/2017 0330   EOSABS 0.3 01/19/2017 1128   BASOSABS 0.0 11/02/2017 0330   BASOSABS 0.0 01/19/2017 1128     54F late stage CKD presenting with rapdily progressive hypoxic and hypercapneic RF.Has a mixed respiratory and metabolic acidosis. Did not respond to high-dose diuretics.InitiatedCRRT 9/16/19for control of acidosis and ultrafiltration.   Assessment/Plan:  1. AKI/CKD stage 4- remains anuric and is now ESRD in setting of shock and pressor support. Currently on CVVHD but has had issues with hypotension. tolerating for now and will start to UF as tolerated. 2. Acidosis- primarily respiratory with pCO2 of 79 and CO2 of 33 (had been given multiple doses of IV bicarb). Vent changes per PCCM. Stopped bicarb gtt. 3. Shock- multifactoria: cardiogenic with decompensated CHF as well as sepsis from PNA. Off levophed but still on dopamine per PCCM 4. VDRF- per PCCM 5. Anemia- acute on chronic due to critical illness 6. Bradycardia- dopamine added 7. Disposition- poor prognosis given multi-organ failure and persistent hypotension despite pressor support and lack of improvement  clinically. Recommend palliative care consult to help set goals/limits of care.  Donetta Potts, MD [employer] [phone]

## 2017-11-03 NOTE — Progress Notes (Signed)
Vent changes made by NP.

## 2017-11-04 ENCOUNTER — Ambulatory Visit: Payer: Medicare Other | Admitting: Internal Medicine

## 2017-11-04 DIAGNOSIS — L8990 Pressure ulcer of unspecified site, unspecified stage: Secondary | ICD-10-CM

## 2017-11-04 LAB — POCT ACTIVATED CLOTTING TIME
ACTIVATED CLOTTING TIME: 219 s
ACTIVATED CLOTTING TIME: 208 s
ACTIVATED CLOTTING TIME: 213 s
Activated Clotting Time: 207 seconds
Activated Clotting Time: 235 seconds
Activated Clotting Time: 240 seconds
Activated Clotting Time: 246 seconds

## 2017-11-04 LAB — GLUCOSE, CAPILLARY
GLUCOSE-CAPILLARY: 110 mg/dL — AB (ref 70–99)
GLUCOSE-CAPILLARY: 123 mg/dL — AB (ref 70–99)
GLUCOSE-CAPILLARY: 84 mg/dL (ref 70–99)
Glucose-Capillary: 126 mg/dL — ABNORMAL HIGH (ref 70–99)
Glucose-Capillary: 132 mg/dL — ABNORMAL HIGH (ref 70–99)
Glucose-Capillary: 101 mg/dL — ABNORMAL HIGH (ref 70–99)

## 2017-11-04 LAB — POCT I-STAT 3, ART BLOOD GAS (G3+)
ACID-BASE DEFICIT: 3 mmol/L — AB (ref 0.0–2.0)
Bicarbonate: 21.5 mmol/L (ref 20.0–28.0)
O2 Saturation: 99 %
PH ART: 7.405 (ref 7.350–7.450)
Patient temperature: 98.6
TCO2: 23 mmol/L (ref 22–32)
pCO2 arterial: 34.2 mmHg (ref 32.0–48.0)
pO2, Arterial: 162 mmHg — ABNORMAL HIGH (ref 83.0–108.0)

## 2017-11-04 LAB — RENAL FUNCTION PANEL
ALBUMIN: 2.7 g/dL — AB (ref 3.5–5.0)
ANION GAP: 13 (ref 5–15)
Albumin: 2.4 g/dL — ABNORMAL LOW (ref 3.5–5.0)
Anion gap: 10 (ref 5–15)
BUN: 11 mg/dL (ref 8–23)
BUN: 8 mg/dL (ref 8–23)
CALCIUM: 8.1 mg/dL — AB (ref 8.9–10.3)
CHLORIDE: 106 mmol/L (ref 98–111)
CO2: 21 mmol/L — ABNORMAL LOW (ref 22–32)
CO2: 22 mmol/L (ref 22–32)
CREATININE: 0.99 mg/dL (ref 0.44–1.00)
Calcium: 8 mg/dL — ABNORMAL LOW (ref 8.9–10.3)
Chloride: 104 mmol/L (ref 98–111)
Creatinine, Ser: 0.68 mg/dL (ref 0.44–1.00)
GFR calc Af Amer: >60 mL/min (ref >60)
GFR calc non Af Amer: 59 mL/min — ABNORMAL LOW (ref >60)
GLUCOSE: 134 mg/dL — AB (ref 70–99)
GLUCOSE: 126 mg/dL — AB (ref 70–99)
PHOSPHORUS: 1.9 mg/dL — AB (ref 2.5–4.6)
Phosphorus: 1.4 mg/dL — ABNORMAL LOW (ref 2.5–4.6)
Potassium: 4 mmol/L (ref 3.5–5.1)
Potassium: 4.1 mmol/L (ref 3.5–5.1)
SODIUM: 139 mmol/L (ref 135–145)
Sodium: 137 mmol/L (ref 135–145)

## 2017-11-04 LAB — CULTURE, BLOOD (ROUTINE X 2)
CULTURE: NO GROWTH
Culture: NO GROWTH
Special Requests: ADEQUATE
Special Requests: ADEQUATE

## 2017-11-04 LAB — APTT: aPTT: >200 seconds (ref 24–36)

## 2017-11-04 LAB — CBC
HEMATOCRIT: 26.5 % — AB (ref 36.0–46.0)
Hemoglobin: 8.3 g/dL — ABNORMAL LOW (ref 12.0–15.0)
MCH: 26.1 pg (ref 26.0–34.0)
MCHC: 31.3 g/dL (ref 30.0–36.0)
MCV: 83.3 fL (ref 78.0–100.0)
Platelets: 209 10*3/uL (ref 150–400)
RBC: 3.18 MIL/uL — AB (ref 3.87–5.11)
RDW: 22.6 % — AB (ref 11.5–15.5)
WBC: 13.4 10*3/uL — AB (ref 4.0–10.5)

## 2017-11-04 LAB — PROCALCITONIN: Procalcitonin: 6.25 ng/mL

## 2017-11-04 LAB — MAGNESIUM: Magnesium: 2.6 mg/dL — ABNORMAL HIGH (ref 1.7–2.4)

## 2017-11-04 MED ORDER — LOPERAMIDE HCL 1 MG/7.5ML PO SUSP
2.0000 mg | Freq: Once | ORAL | Status: AC
  Administered 2017-11-04: 2 mg
  Filled 2017-11-04: qty 15

## 2017-11-04 MED ORDER — FENTANYL CITRATE (PF) 100 MCG/2ML IJ SOLN
50.0000 ug | INTRAMUSCULAR | Status: DC | PRN
  Administered 2017-11-04 – 2017-11-05 (×2): 50 ug via INTRAVENOUS
  Filled 2017-11-04 (×2): qty 2

## 2017-11-04 MED ORDER — POTASSIUM PHOSPHATES 15 MMOLE/5ML IV SOLN
30.0000 mmol | Freq: Once | INTRAVENOUS | Status: AC
  Administered 2017-11-04: 30 mmol via INTRAVENOUS
  Filled 2017-11-04: qty 10

## 2017-11-04 MED ORDER — VITAL HIGH PROTEIN PO LIQD
1000.0000 mL | ORAL | Status: DC
  Administered 2017-11-04: 1000 mL

## 2017-11-04 NOTE — Progress Notes (Signed)
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  Being addressed via TF   GOAL:   Patient will meet greater than or equal to 90% of their needs  Met  MONITOR:   TF tolerance, Vent status, Labs, Weight trends  REASON FOR ASSESSMENT:   Ventilator    ASSESSMENT:    62 yo female admitted with acute reapiratory failure with acute pulmonary edema requiring intubation, AKI on CKD started on CVVHD. 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 Liquid stool output >3 L, noted bowel regimen stopped  Weight down; current wt 60.2 kg; admission wt 63.7 kg. Net negative 4.8 L Noted change in height in system as well since last assessment Nutritional needs adjusted  Noted stage II pressure ulcer on sacrum now  Labs: phosphorus 1.4 (L) Meds: B-complex with Vitamin C, potassium phosphate   Diet Order:   Diet Order    None      EDUCATION NEEDS:   Not appropriate for education at this time  Skin:  Skin Assessment: Skin Integrity Issues: Skin Integrity Issues:: Stage II Stage II: sacrum  Last BM:  9/20 diarrhea via rectal tube  Height:   Ht Readings from Last 1 Encounters:   10/31/17 _0  (1.702 m)    Weight:   Wt Readings from Last 1 Encounters:  11/04/17 60.2 kg    Ideal Body Weight:     BMI:  Body mass index is 20.79 kg/m.  Estimated Nutritional Needs:   Kcal:  1339  Protein:  110-135 g  Fluid:  >/= 1.2 L   Kerman Passey MS, RD, LDN, CNSC [phone] Pager  [phone] Weekend/On-Call Pager

## 2017-11-04 NOTE — Progress Notes (Signed)
Wasted 200 mL of fentanyl and 60 mL of versed down sink.  Dillion White RN witnessed the waste down the sink.

## 2017-11-04 NOTE — Care Management Note (Signed)
Case Management Note  Patient Details  Name: Diana Walters MRN: [id number] Date of Birth: 1956/01/29  Subjective/Objective:  Presents with hypoxemic respiratory failure, acute pulmonary edema, AoCKD, metabolic acidosis.  Started on BiPAP but failed and required intubation.  Did not respond to diuresis;  started CRRT overnight                 Action/Plan: NCM will follow for transition of care needs.   Expected Discharge Date:                  Expected Discharge Plan:     In-House Referral:     Discharge planning Services  CM Consult  Post Acute Care Choice:    Choice offered to:     DME Arranged:    DME Agency:     HH Arranged:    HH Agency:     Status of Service:  In process, will continue to follow  If discussed at Long Length of Stay Meetings, dates discussed:    Additional Comments:  Zenon Mayo, RN 11/04/2017, 3:28 PM

## 2017-11-04 NOTE — Progress Notes (Signed)
CRITICAL VALUE ALERT  Critical Value:  APTT >200  Date & Time Notied:  11/04/17 @ 519  Provider Notified: Warren Lacy  Orders Received/Actions taken:

## 2017-11-04 NOTE — Progress Notes (Signed)
S: Off sedation and had an episode of hypotension and drop in CVP overnight.  Also had large amount of liquid stool overnight. O:BP (!) 171/33   Pulse 93   Temp (!) 95 F (35 C) (Rectal)   Resp (!) 28   Ht 5' 7"  (1.702 m)   Wt 60.2 kg   SpO2 100%   BMI 20.79 kg/m   Intake/Output Summary (Last 24 hours) at 11/04/2017 1206 Last data filed at 11/04/2017 1200 Gross per 24 hour  Intake 2384.85 ml  Output 7361 ml  Net -4976.15 ml   Intake/Output: I/O last 3 completed shifts: In: 3902.1 [I.V.:1056.7; NG/GT:2035; IV Piggyback:810.4] Out: 8666 [HKUVJ:5051; Stool:3200]  Intake/Output this shift:  Total I/O In: 771.6 [I.V.:44.2; NG/GT:275; IV Piggyback:452.4] Out: 1009 [Other:559; Stool:450] Weight change: -6 kg Gen: intubated, somnolent, unresponsive CVS:no rub Resp: occ rhonchi Abd: +BS, soft Ext: no edema, RUE AVF +T/B  Recent Labs  Lab 11/01/17 0418 11/01/17 1111 11/01/17 1600 11/02/17 0330 11/02/17 1615 11/03/17 0412 11/03/17 1630 11/04/17 0330  NA 145 141 141 140 137 136 137 137  K 3.9 3.8 4.0 3.7 4.2 3.6 3.8 4.0  CL 102 103 105 102 102 105 106 106  CO2 36* 29 28 31 27 24 23  21*  GLUCOSE 173* 138* 95 105* 134* 112* 137* 134*  BUN 19 14 12 8 8 9 9 11   CREATININE 1.63* 1.26* 1.11* 0.91 0.83 0.72 0.77 0.99  ALBUMIN 2.5*  --  2.5* 2.4* 2.4* 2.2* 2.4* 2.4*  CALCIUM 7.7* 7.3* 7.3* 7.6* 7.5* 7.4* 7.7* 8.0*  PHOS 3.2  3.2  --  2.3* 1.4*  1.5* 2.4* <1.0* 2.1* 1.4*   Liver Function Tests: Recent Labs  Lab 11/03/17 0412 11/03/17 1630 11/04/17 0330  ALBUMIN 2.2* 2.4* 2.4*   No results for input(s): LIPASE, AMYLASE in the last 168 hours. No results for input(s): AMMONIA in the last 168 hours. CBC: Recent Labs  Lab 11/08/2017 1024  10/31/17 0519 11/01/17 0418 11/02/17 0330 11/03/17 0412 11/04/17 0330  WBC 17.5*   < > 11.3* 8.9 7.3 11.3* 13.4*  NEUTROABS 15.2*  --   --   --  6.1  --   --   HGB 9.5*   < > 8.3* 7.5* 7.7* 7.9* 8.3*  HCT 31.3*   < > 25.9* 23.7*  24.7* 25.5* 26.5*  MCV 86.5   < > 82.2 81.7 82.9 82.8 83.3  PLT 376   < > 269 176 183 181 209   < > = values in this interval not displayed.   Cardiac Enzymes: Recent Labs  Lab 10/31/17 0519 10/31/17 1208 11/01/17 1111 11/01/17 1600 11/01/17 2319  TROPONINI 1.24* 1.22* 0.51* 0.43* 0.47*   CBG: Recent Labs  Lab 11/03/17 1629 11/03/17 1957 11/04/17 0001 11/04/17 0344 11/04/17 0823  GLUCAP 129* 127* 110* 123* 126*    Iron Studies: No results for input(s): IRON, TIBC, TRANSFERRIN, FERRITIN in the last 72 hours. Studies/Results: Dg Chest Port 1 View  Result Date: 11/03/2017 CLINICAL DATA:  Respiratory failure. EXAM: PORTABLE CHEST 1 VIEW COMPARISON:  11/01/2017. FINDINGS: Endotracheal tube, NG tube, right subclavian line stable position. Prior median sternotomy and CABG. Heart size stable. Aortic stent graft in stable position. Previously identified bilateral pulmonary infiltrates/edema has cleared. No prominent pleural effusion or pneumothorax. No acute bony abnormality. IMPRESSION: 1.  Lines and tubes stable position. 2. Prior CABG. Aortic stent graft in stable position. Heart size stable. 3. No acute cardiopulmonary disease identified. Bilateral pulmonary infiltrates/edema has cleared. Electronically Signed  By: Ronceverte   On: 11/03/2017 09:28   . aspirin  81 mg Per Tube Daily  . atorvastatin  40 mg Per Tube Daily  . B-complex with vitamin C  1 tablet Per Tube Daily  . chlorhexidine gluconate (MEDLINE KIT)  15 mL Mouth Rinse BID  . Chlorhexidine Gluconate Cloth  6 each Topical q morning - 10a  . famotidine  20 mg Per Tube Daily  . heparin injection (subcutaneous)  5,000 Units Subcutaneous Q8H  . insulin aspart  0-9 Units Subcutaneous Q4H  . ipratropium-albuterol  3 mL Nebulization TID  . mouth rinse  15 mL Mouth Rinse 10 times per day    BMET    Component Value Date/Time   NA 137 11/04/2017 0330   NA 141 06/22/2017 1106   K 4.0 11/04/2017 0330   CL 106  11/04/2017 0330   CO2 21 (L) 11/04/2017 0330   GLUCOSE 134 (H) 11/04/2017 0330   BUN 11 11/04/2017 0330   BUN 31 (H) 06/22/2017 1106   CREATININE 0.99 11/04/2017 0330   CREATININE 1.10 06/10/2014 0813   CALCIUM 8.0 (L) 11/04/2017 0330   CALCIUM 7.6 (L) 06/09/2017 0230   GFRNONAA 59 (L) 11/04/2017 0330   GFRAA >60 11/04/2017 0330   CBC    Component Value Date/Time   WBC 13.4 (H) 11/04/2017 0330   RBC 3.18 (L) 11/04/2017 0330   HGB 8.3 (L) 11/04/2017 0330   HGB 10.3 (L) 06/22/2017 1106   HCT 26.5 (L) 11/04/2017 0330   HCT 31.8 (L) 06/22/2017 1106   PLT 209 11/04/2017 0330   PLT 304 06/22/2017 1106   MCV 83.3 11/04/2017 0330   MCV 77 (L) 06/22/2017 1106   MCH 26.1 11/04/2017 0330   MCHC 31.3 11/04/2017 0330   RDW 22.6 (H) 11/04/2017 0330   RDW 21.7 (H) 06/22/2017 1106   LYMPHSABS 0.9 11/02/2017 0330   LYMPHSABS 1.9 01/19/2017 1128   MONOABS 0.1 11/02/2017 0330   EOSABS 0.2 11/02/2017 0330   EOSABS 0.3 01/19/2017 1128   BASOSABS 0.0 11/02/2017 0330   BASOSABS 0.0 01/19/2017 1128     43F late stage CKD presenting with rapdily progressive hypoxic and hypercapneic RF.Has a mixed respiratory and metabolic acidosis. Didnot respond to high-dose diuretics.InitiatedCRRT 9/16/19for control of acidosis and ultrafiltration.   Assessment/Plan:  1. AKI/CKD stage 4-remains anuric and isnow ESRD in setting of shock and pressor support. Currently on CVVHD but has had issues with hypotension. 1. tolerating for now and will keep even given low CVP and BP. 2. Acidosis- primarily respiratory with pCO2 of 79 and CO2 of 33 (had been given multiple doses of IV bicarb). Vent changes per PCCM. Stopped bicarb gtt. 3. Shock- multifactoria: cardiogenic with decompensated CHF as well as sepsis from PNA. Offlevophed but still ondopamine per PCCM 4. VDRF- per PCCM 5. Anemia- acute on chronic due to critical illness 6. Bradycardia- dopamine added 7. Disposition- poor prognosis  given multi-organ failure and persistent hypotension despite pressor supportand lack of improvement clinically. Recommend palliative care consult to help set goals/limits of care.  Donetta Potts, MD [employer] [phone]

## 2017-11-04 NOTE — Progress Notes (Signed)
Diana Walters  IWP:[id number] DOB: 07/22/1955 DOA: 11/13/2017 PCP: Renato Shin, MD    LOS: 5 days   Reason for Consult / Chief Complaint:  Acute Respiratory Distress  Consulting MD and date:  TRH Dhungel, 9/15  HPI/Summary of hospital stay:  62 year old female with PMH of Tobacco Use, CVA, Anemia, HLD, Thoracic Aortic Aneurysm s/p endovascular stent graft 08/2017, anxiety, HTN, COPD, RA, CKD stage IV with new right arm fistula, Diastolic HF  Presents to ED On 9/15 with dyspnea. Admitted with hypoxemic respiratory failure, acute pulmonary edema, AoCKD, metabolic acidosis.  Started on BiPAP but failed and required intubation.  Did not respond to diuresis; therefore, started CRRT overnight.  9/15 admit, intubated 9/16 started on CRRT. Hypotensive with increasing pressor requirements, but BP actually higher by R femoral arterial line. Pressors weaned off. NMB started for dyssynchrony. 9/16 TTE >> - Left ventricle: The cavity size was normal. There was mild   concentric hypertrophy. Systolic function was mildly to   moderately reduced. The estimated ejection fraction was in the   range of 40% to 45%. Diffuse hypokinesis. There is hypokinesis of   the mid-apicalanteroseptal, inferolateral, and apical myocardium.   Features are consistent with a pseudonormal left ventricular   filling pattern, with concomitant abnormal relaxation and   increased filling pressure (grade 2 diastolic dysfunction).   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 - Ventricular septum: The contour showed diastolic flattening. - Aortic valve: There was mild regurgitation. - Mitral valve: There was trivial regurgitation. - Tricuspid valve: There was moderate regurgitation. - Pulmonary arteries: Systolic pressure was moderately to severely   increased. PA peak pressure: 66 mm Hg (S). Impressions:  - EF is reduced when compared to prior (50-55%)  9/17 dopamine started for episodes of  bradycardia; remains on CRRT/ NMB 9/18 discordance in cuff vs aline pressures; remains on dopamine for bradycardia, off other pressors; NMB off, UF kept even 9/19 dopamine off/ HR stable; UF +50 ml/hr; sedation weaning  Micro: 9/15 BC >> ngtd 9/15 Respiratory culture >> normal flora 9/15 MRSA PCR negative 9/16 Respiratory panel negative  ABX:  9/15 vanc >>9/16 9/15 cefepime >> 9/20  Subjective:  Dopamine and sedation off yesterday afternoon- still not f/c Brief hypotension overnight, placed on levophed x 1 hour Continues UF 50-100 as tolerated Started with continuous high output liquid stools last night, 3L requiring flexiseal CVP 8 -> 4 Remains normotensive this morning but wide pulse pressure Currently weaning 10/5 Hypothermic on bair huggar WBC 11.3-> 13.4  Objective   Blood pressure (!) 153/31, pulse 93, temperature (!) 95 F (35 C), temperature source Rectal, resp. rate (!) 27, height 5\' 7"  (1.702 m), weight 60.2 kg, SpO2 100 %. CVP:  [2 mmHg] 2 mmHg  Vent Mode: PSV FiO2 (%):  [40 %] 40 % Set Rate:  [20 bmp] 20 bmp Vt Set:  [450 mL] 450 mL PEEP:  [5 cmH20] 5 cmH20 Pressure Support:  [10 cmH20] 10 cmH20 Plateau Pressure:  [20 cmH20-24 cmH20] 22 cmH20   Intake/Output Summary (Last 24 hours) at 11/04/2017 1020 Last data filed at 11/04/2017 1000 Gross per 24 hour  Intake 2270.51 ml  Output 7540 ml  Net -5269.49 ml   Filed Weights   11/02/17 0500 11/03/17 0456 11/04/17 0402  Weight: 67.8 kg 66.2 kg 60.2 kg   Examination: General:  Critically ill adult female on MV in no distress HEENT: MM pink/moist, pupils 3/reactive, ETT/ OGT, improved scleral edema Neuro: attempts to open  eyes to voice, still no w/d to noxious stimuli, +cough CV:  SR 54'T rrr, systolic murmur PULM: even/non-labored, lungs bilaterally clear, faint left base endexp rub GI: soft, hyperBS, rectal tube in place, no UOP Extremities: cool/dry, improved anasarca more dependent UE Skin: no rashes    Consults: date of consult/date signed off & final recs:  PCCM 9/15 > Renal 9/16 > started on CRRT Cards 9/16  Assessment & Plan:   Acute Hypoxic Respiratory Distress in setting of cardiogenic pulmonary edema secondary to decompensated heart failure with acute on chronic kidney disease vs ESRD + PNA. H/O COPD, Tobacco Use  - stable CXR 9/19; clear infiltrates  P:  Continue full MV support Currently weaning well but mental status prevents extubation at this time VAP measures  Trend CXR Continue BD  PNA P:  Will check PCT today, if down from prior and given cleared CXR with negative cultures will d/c cefepime, currently on day 6.    Shock - presumed multifactorial; septic in setting PNA and cardiogenic in setting decompensated heart failure (Echo from July 2019 with EF 50 - 55%, G2DD). Sinus Bradycardia - thought related to metabolic issues now resolved Elevated troponin - - stable trend in troponin 0.51 > 0.43 > 0.47, likely demand ischemia Hx AAA s/p stent graft July 2019, HTN, HLD PAF - TTE 9/16 with LVEF 40-45%, PAP 66 mm Hg Plan  - HR and BP stable currently off pressors/ inotrope's - goal MAP > 55 given advanced CKD  - will need to consider anticoagulation per Cards recs for CHADSvasc 5  - Goal K > 4; Mag > 2  Acute on Chronic Stage IV Kidney Disease vs Progression to ESRD Anion Gap Metabolic Acidosis - resolved  Hypophosphatemia  - remains anuric  Plan  Continue CRRT per Nephrology, possibly transition to [facility] tomorrow Ongoing Kphos replete, additional Kphos 30 mmol now  BMET BID on CRRT and monitor closely given new diarrhea  Anemia in setting of chronic disease. - stable h/h Plan  Transfuse for Hgb < 7 Trending CBC  Hyperglycemia - no hx DM. Plan  SSI,  sensitive  CBG q 4  Sedation Needs due to mechanical ventilation. H/O CVA, RA -tolerating vent without sedation needs  - slow improvement in mental status from yesterday, remains globally weak s/p 48hr  of NMB on 9/17 Plan  Sedation stopped to allow good mental status eval Prn sedation only PT consult when appropriate   Constipation - resolved, now with > 3L liquid stool  - on TF P:  Stop scheduled colace UF now even to prevent dehydration  Disposition / Summary of Today's Plan 11/04/17   Continue CRRT, even UF given large output diarrhea Allow patient to wake up / monitor neuro status Ongoing SBT- no extubation till more awake    DVT prophylaxis: Heparin iv for CRRT, SQ heparin/ SCDs GI prophylaxis: Pepcid  Diet: high nutritional risk. TF at goal  Mobility: Bedrest  Code Status: Full Code  Family Communication: Daughter Sharyn Lull updated at bedside. I did approach concerns of meaningful recovery given critical illness with ongoing chronic issues and now progression of CKD but daughter remains hopeful. Will have to see how patient progresses over the weekend and readdress.   Labs and ancillary test    Recent Labs  Lab 11/10/2017 1024  10/31/17 0519 11/01/17 0418 11/02/17 0330 11/03/17 0412 11/04/17 0330  WBC 17.5*   < > 11.3* 8.9 7.3 11.3* 13.4*  NEUTROABS 15.2*  --   --   --  6.1  --   --   HGB 9.5*   < > 8.3* 7.5* 7.7* 7.9* 8.3*  HCT 31.3*   < > 25.9* 23.7* 24.7* 25.5* 26.5*  MCV 86.5   < > 82.2 81.7 82.9 82.8 83.3  PLT 376   < > 269 176 183 181 209   < > = values in this interval not displayed.   Basic Metabolic Panel: improving acidosis with effective dialysis. Recent Labs  Lab 11/01/17 0418  11/01/17 1600 11/02/17 0330 11/02/17 1615 11/03/17 0412 11/03/17 1630 11/04/17 0330  NA 145   < > 141 140 137 136 137 137  K 3.9   < > 4.0 3.7 4.2 3.6 3.8 4.0  CL 102   < > 105 102 102 105 106 106  CO2 36*   < > 28 31 27 24 23  21*  GLUCOSE 173*   < > 95 105* 134* 112* 137* 134*  BUN 19   < > 12 8 8 9 9 11   CREATININE 1.63*   < > 1.11* 0.91 0.83 0.72 0.77 0.99  CALCIUM 7.7*   < > 7.3* 7.6* 7.5* 7.4* 7.7* 8.0*  MG 2.4  --  2.4 2.4  --  2.5*  --  2.6*  PHOS 3.2  3.2   --  2.3* 1.4*  1.5* 2.4* <1.0* 2.1* 1.4*   < > = values in this interval not displayed.   GFR: Estimated Creatinine Clearance: 55.3 mL/min (by C-G formula based on SCr of 0.99 mg/dL). Recent Labs  Lab 10/26/2017 1948 11/03/2017 2248 10/31/17 0519 10/31/17 1934 11/01/17 0418 11/01/17 0747 11/02/17 0330 11/03/17 0412 11/04/17 0330  PROCALCITON 21.07  --  30.69  --  29.14  --   --   --   --   WBC  --   --  11.3*  --  8.9  --  7.3 11.3* 13.4*  LATICACIDVEN 2.1* 2.7*  --  3.2*  --  1.1  --   --   --    Persistent lactic acidosis due to impaired renal clearance.  Liver Function Tests: Recent Labs  Lab 11/02/17 0330 11/02/17 1615 11/03/17 0412 11/03/17 1630 11/04/17 0330  ALBUMIN 2.4* 2.4* 2.2* 2.4* 2.4*   No results for input(s): LIPASE, AMYLASE in the last 168 hours. No results for input(s): AMMONIA in the last 168 hours. ABG  Compensated respiratory acidosis.     Component Value Date/Time   PHART 7.405 11/04/2017 0955   PCO2ART 34.2 11/04/2017 0955   PO2ART 162.0 (H) 11/04/2017 0955   HCO3 21.5 11/04/2017 0955   TCO2 23 11/04/2017 0955   ACIDBASEDEF 3.0 (H) 11/04/2017 0955   O2SAT 99.0 11/04/2017 0955    Coagulation Profile: No results for input(s): INR, PROTIME in the last 168 hours. Cardiac Enzymes: consistent with demand/sepsis related cardiomyopathy Recent Labs  Lab 10/31/17 0519 10/31/17 1208 11/01/17 1111 11/01/17 1600 11/01/17 2319  TROPONINI 1.24* 1.22* 0.51* 0.43* 0.47*    CBG:  Stress hyperglycemia with adequate control.  Recent Labs  Lab 11/03/17 1211 11/03/17 1629 11/03/17 1957 11/04/17 0001 11/04/17 0823  GLUCAP 109* 129* 127* 110* 126*    CCT: 45 mins  Kennieth Rad, AGACNP-BC [facility] Pgr: [phone] or if no answer [phone] 11/04/2017, 11:03 AM

## 2017-11-04 NOTE — Progress Notes (Signed)
Vent mode changed to PSV 10/5 40%, patient tolerated well, NP in room and aware.

## 2017-11-04 NOTE — Progress Notes (Signed)
Progress Note  Patient Name: Diana Walters Date of Encounter: 11/04/2017  Primary Cardiologist: Dorris Carnes, MD   Subjective   Remains intubated; does not respond to verbal stimuli  Inpatient Medications    Scheduled Meds: . aspirin  81 mg Per Tube Daily  . atorvastatin  40 mg Per Tube Daily  . B-complex with vitamin C  1 tablet Per Tube Daily  . chlorhexidine gluconate (MEDLINE KIT)  15 mL Mouth Rinse BID  . Chlorhexidine Gluconate Cloth  6 each Topical q morning - 10a  . famotidine  20 mg Per Tube Daily  . heparin injection (subcutaneous)  5,000 Units Subcutaneous Q8H  . insulin aspart  0-9 Units Subcutaneous Q4H  . ipratropium-albuterol  3 mL Nebulization TID  . mouth rinse  15 mL Mouth Rinse 10 times per day   Continuous Infusions: . sodium chloride Stopped (11/04/17 0849)  . sodium chloride 10 mL/hr at 10/31/17 0700  . ceFEPime (MAXIPIME) IV 200 mL/hr at 11/04/17 0900  . DOPamine Stopped (11/03/17 1546)  . feeding supplement (VITAL AF 1.2 CAL) 55 mL/hr at 11/04/17 0600  . fentaNYL infusion INTRAVENOUS Stopped (11/03/17 1456)  . heparin 10,000 units/ 20 mL infusion syringe 750 Units/hr (11/04/17 0338)  . heparin 999 mL/hr at 11/04/17 0328  . midazolam (VERSED) infusion Stopped (11/03/17 1456)  . norepinephrine (LEVOPHED) Adult infusion 3 mcg/min (11/04/17 0500)  . potassium PHOSPHATE IVPB (in mmol) 85 mL/hr at 11/04/17 0900  . dialysis replacement fluid (prismasate) 1,200 mL/hr at 11/04/17 0754  . dialysis replacement fluid (prismasate) 500 mL/hr at 11/04/17 0335  . dialysate (PRISMASATE) 1,500 mL/hr at 11/04/17 0733  . sodium chloride 999 mL/hr at 10/31/17 0900   PRN Meds: sodium chloride, Place/Maintain arterial line **AND** sodium chloride, acetaminophen **OR** acetaminophen, albuterol, fentaNYL (SUBLIMAZE) injection, fentaNYL (SUBLIMAZE) injection, heparin, heparin, heparin, midazolam, sodium chloride   Vital Signs    Vitals:   11/04/17 0800 11/04/17 0833  11/04/17 0834 11/04/17 0900  BP: (!) 190/31   (!) 153/31  Pulse:      Resp: (!) 28   (!) 27  Temp: (!) 95 F (35 C)     TempSrc: Rectal     SpO2: 100% 100% 100%   Weight:      Height:        Intake/Output Summary (Last 24 hours) at 11/04/2017 1006 Last data filed at 11/04/2017 0900 Gross per 24 hour  Intake 2112.82 ml  Output 7418 ml  Net -5305.18 ml   Filed Weights   11/02/17 0500 11/03/17 0456 11/04/17 0402  Weight: 67.8 kg 66.2 kg 60.2 kg    Telemetry    Sinus with occasional PVC- Personally Reviewed  Physical Exam   GEN: Intubated Neck: No JVD Cardiac: RRR, 2/6 systolic murmur Respiratory: mild rhonchi GI: Soft, ND MS: trace edema Neuro:  unresponsive  Labs    Chemistry Recent Labs  Lab 11/03/17 0412 11/03/17 1630 11/04/17 0330  NA 136 137 137  K 3.6 3.8 4.0  CL 105 106 106  CO2 24 23 21*  GLUCOSE 112* 137* 134*  BUN '9 9 11  ' CREATININE 0.72 0.77 0.99  CALCIUM 7.4* 7.7* 8.0*  ALBUMIN 2.2* 2.4* 2.4*  GFRNONAA >60 >60 59*  GFRAA >60 >60 >60  ANIONGAP '7 8 10     ' Hematology Recent Labs  Lab 11/02/17 0330 11/03/17 0412 11/04/17 0330  WBC 7.3 11.3* 13.4*  RBC 2.98* 3.08* 3.18*  HGB 7.7* 7.9* 8.3*  HCT 24.7* 25.5* 26.5*  MCV 82.9  82.8 83.3  MCH 25.8* 25.6* 26.1  MCHC 31.2 31.0 31.3  RDW 22.3* 22.8* 22.6*  PLT 183 181 209    Cardiac Enzymes Recent Labs  Lab 10/31/17 1208 11/01/17 1111 11/01/17 1600 11/01/17 2319  TROPONINI 1.22* 0.51* 0.43* 0.47*    Recent Labs  Lab 10/21/2017 1037  TROPIPOC 0.01     BNP Recent Labs  Lab 10/29/2017 1024  BNP >4,500.0*     DDimer  Recent Labs  Lab 10/19/2017 1024  DDIMER 14.31*     Radiology    Dg Chest Port 1 View  Result Date: 11/03/2017 CLINICAL DATA:  Respiratory failure. EXAM: PORTABLE CHEST 1 VIEW COMPARISON:  11/01/2017. FINDINGS: Endotracheal tube, NG tube, right subclavian line stable position. Prior median sternotomy and CABG. Heart size stable. Aortic stent graft in stable  position. Previously identified bilateral pulmonary infiltrates/edema has cleared. No prominent pleural effusion or pneumothorax. No acute bony abnormality. IMPRESSION: 1.  Lines and tubes stable position. 2. Prior CABG. Aortic stent graft in stable position. Heart size stable. 3. No acute cardiopulmonary disease identified. Bilateral pulmonary infiltrates/edema has cleared. Electronically Signed   By: Marcello Moores  Register   On: 11/03/2017 09:28    Patient Profile     62 y.o. female with past medical history of prior aortic arch repair for thoracic aortic aneurysm, TEVAR with subsequent endoleak and more recent endovascular stent graft placement, hypertension, chronic stage IV kidney disease, COPD, chronic combined systolic/diastolic congestive heart failure admitted with respiratory failure requiring intubation.  Echocardiogram shows ejection fraction 40 to 45% with apical wall motion abnormality; moderate tricuspid regurgitation with moderate to severe pulmonary hypertension.  Troponin elevated.  Assessment & Plan    1 acute pulmonary edema-echocardiogram this admission showed LV function worse compared to previous.  We will plan further cardiac evaluation if she improves.  She would likely require cardiac catheterization.  Continue aspirin and statin.    2 acute on chronic stage IV kidney disease-dialysis per nephrology  3 possible pneumonia-Antibiotics per CCM  4 elevated troponin-this is felt likely due to demand ischemia.  However LV function worse compared to previous.  Plan cardiac catheterization if she improves.  5 paroxysmal atrial fibrillation-previously noted on telemetry.  Remains in sinus. CHADSvasc 5.  Continue SQ heparin for now.  Atrial fibrillation may have been related to stress of presentation.  We will not anticoagulate long-term unless she has recurrences.  6 bradycardia-resolved  Sedation has been discontinued and patient not responsive to verbal stimuli.  Prognosis unclear.   We will see again Monday.  Please call prior to then with questions.  For questions or updates, please contact [place] Please consult [email or web address] for contact info under        Signed, Kirk Ruths, MD  11/04/2017, 10:06 AM

## 2017-11-04 NOTE — Progress Notes (Signed)
Changed vent mode back to [facility] due to increased RR to 40 BPM.

## 2017-11-05 ENCOUNTER — Inpatient Hospital Stay (HOSPITAL_COMMUNITY): Payer: Medicare Other

## 2017-11-05 LAB — RENAL FUNCTION PANEL
ALBUMIN: 2.7 g/dL — AB (ref 3.5–5.0)
ALBUMIN: 2.8 g/dL — AB (ref 3.5–5.0)
ANION GAP: 19 — AB (ref 5–15)
ANION GAP: 21 — AB (ref 5–15)
Albumin: 2.7 g/dL — ABNORMAL LOW (ref 3.5–5.0)
Anion gap: 22 — ABNORMAL HIGH (ref 5–15)
BUN: 12 mg/dL (ref 8–23)
BUN: 11 mg/dL (ref 8–23)
BUN: <5 mg/dL — ABNORMAL LOW (ref 8–23)
CALCIUM: 8.4 mg/dL — AB (ref 8.9–10.3)
CHLORIDE: 82 mmol/L — AB (ref 98–111)
CO2: 16 mmol/L — AB (ref 22–32)
CO2: 15 mmol/L — ABNORMAL LOW (ref 22–32)
CO2: 34 mmol/L — ABNORMAL HIGH (ref 22–32)
CREATININE: 0.69 mg/dL (ref 0.44–1.00)
Calcium: 8.4 mg/dL — ABNORMAL LOW (ref 8.9–10.3)
Calcium: 6.6 mg/dL — ABNORMAL LOW (ref 8.9–10.3)
Chloride: 104 mmol/L (ref 98–111)
Chloride: 102 mmol/L (ref 98–111)
Creatinine, Ser: 0.96 mg/dL (ref 0.44–1.00)
Creatinine, Ser: 0.94 mg/dL (ref 0.44–1.00)
GFR calc Af Amer: >60 mL/min (ref >60)
GFR calc non Af Amer: >60 mL/min (ref >60)
GFR calc non Af Amer: >60 mL/min (ref >60)
GLUCOSE: 96 mg/dL (ref 70–99)
Glucose, Bld: 82 mg/dL (ref 70–99)
Glucose, Bld: 93 mg/dL (ref 70–99)
PHOSPHORUS: 4 mg/dL (ref 2.5–4.6)
PHOSPHORUS: 3.6 mg/dL (ref 2.5–4.6)
POTASSIUM: 5.4 mmol/L — AB (ref 3.5–5.1)
POTASSIUM: 5.2 mmol/L — AB (ref 3.5–5.1)
POTASSIUM: 4.5 mmol/L (ref 3.5–5.1)
Phosphorus: 3.6 mg/dL (ref 2.5–4.6)
Sodium: 139 mmol/L (ref 135–145)
Sodium: 138 mmol/L (ref 135–145)
Sodium: 138 mmol/L (ref 135–145)

## 2017-11-05 LAB — BLOOD GAS, ARTERIAL
ACID-BASE DEFICIT: 12.2 mmol/L — AB (ref 0.0–2.0)
Bicarbonate: 13.7 mmol/L — ABNORMAL LOW (ref 20.0–28.0)
Drawn by: 270221
FIO2: 30
LHR: 20 {breaths}/min
O2 SAT: 98.1 %
PATIENT TEMPERATURE: 93.5
PCO2 ART: 28.5 mmHg — AB (ref 32.0–48.0)
PEEP/CPAP: 5 cmH2O
PH ART: 7.283 — AB (ref 7.350–7.450)
VT: 450 mL
pO2, Arterial: 112 mmHg — ABNORMAL HIGH (ref 83.0–108.0)

## 2017-11-05 LAB — POCT ACTIVATED CLOTTING TIME
ACTIVATED CLOTTING TIME: 191 s
ACTIVATED CLOTTING TIME: 230 s
Activated Clotting Time: 208 seconds
Activated Clotting Time: 241 s
Activated Clotting Time: 263 s
Activated Clotting Time: 197 seconds

## 2017-11-05 LAB — POCT I-STAT, CHEM 8
BUN: 8 mg/dL (ref 8–23)
Calcium, Ion: 0.64 mmol/L — CL (ref 1.15–1.40)
Chloride: 84 mmol/L — ABNORMAL LOW (ref 98–111)
Creatinine, Ser: 1.2 mg/dL — ABNORMAL HIGH (ref 0.44–1.00)
Glucose, Bld: 122 mg/dL — ABNORMAL HIGH (ref 70–99)
HCT: 18 % — ABNORMAL LOW (ref 36.0–46.0)
Hemoglobin: 6.1 g/dL — CL (ref 12.0–15.0)
Potassium: 6.6 mmol/L (ref 3.5–5.1)
Sodium: 131 mmol/L — ABNORMAL LOW (ref 135–145)
TCO2: 20 mmol/L — ABNORMAL LOW (ref 22–32)

## 2017-11-05 LAB — GLUCOSE, CAPILLARY
GLUCOSE-CAPILLARY: 52 mg/dL — AB (ref 70–99)
GLUCOSE-CAPILLARY: 77 mg/dL (ref 70–99)
GLUCOSE-CAPILLARY: 154 mg/dL — AB (ref 70–99)
Glucose-Capillary: 113 mg/dL — ABNORMAL HIGH (ref 70–99)
Glucose-Capillary: 40 mg/dL — CL (ref 70–99)
Glucose-Capillary: 46 mg/dL — ABNORMAL LOW (ref 70–99)
Glucose-Capillary: 65 mg/dL — ABNORMAL LOW (ref 70–99)
Glucose-Capillary: 60 mg/dL — ABNORMAL LOW (ref 70–99)
Glucose-Capillary: 31 mg/dL — CL (ref 70–99)

## 2017-11-05 LAB — BASIC METABOLIC PANEL
Anion gap: 15 (ref 5–15)
Anion gap: 23 — ABNORMAL HIGH (ref 5–15)
BUN: 5 mg/dL — ABNORMAL LOW (ref 8–23)
BUN: <5 mg/dL — ABNORMAL LOW (ref 8–23)
CALCIUM: 8 mg/dL — AB (ref 8.9–10.3)
CO2: 20 mmol/L — AB (ref 22–32)
CO2: 34 mmol/L — AB (ref 22–32)
Calcium: 6.6 mg/dL — ABNORMAL LOW (ref 8.9–10.3)
Chloride: 103 mmol/L (ref 98–111)
Chloride: 80 mmol/L — ABNORMAL LOW (ref 98–111)
Creatinine, Ser: 0.64 mg/dL (ref 0.44–1.00)
Creatinine, Ser: 0.72 mg/dL (ref 0.44–1.00)
GFR calc Af Amer: >60 mL/min (ref >60)
GFR calc Af Amer: >60 mL/min (ref >60)
GLUCOSE: 96 mg/dL (ref 70–99)
GLUCOSE: 93 mg/dL (ref 70–99)
POTASSIUM: 4.5 mmol/L (ref 3.5–5.1)
Potassium: 4.7 mmol/L (ref 3.5–5.1)
Sodium: 138 mmol/L (ref 135–145)
Sodium: 137 mmol/L (ref 135–145)

## 2017-11-05 LAB — CBC
HCT: 30.1 % — ABNORMAL LOW (ref 36.0–46.0)
HEMOGLOBIN: 9.2 g/dL — AB (ref 12.0–15.0)
MCH: 26.3 pg (ref 26.0–34.0)
MCHC: 30.6 g/dL (ref 30.0–36.0)
MCV: 86 fL (ref 78.0–100.0)
Platelets: 188 10*3/uL (ref 150–400)
RBC: 3.5 MIL/uL — AB (ref 3.87–5.11)
RDW: 23.3 % — ABNORMAL HIGH (ref 11.5–15.5)
WBC: 19.5 10*3/uL — ABNORMAL HIGH (ref 4.0–10.5)

## 2017-11-05 LAB — POCT I-STAT 3, ART BLOOD GAS (G3+)
ACID-BASE DEFICIT: 5 mmol/L — AB (ref 0.0–2.0)
Acid-base deficit: 7 mmol/L — ABNORMAL HIGH (ref 0.0–2.0)
BICARBONATE: 20.9 mmol/L (ref 20.0–28.0)
Bicarbonate: 17.6 mmol/L — ABNORMAL LOW (ref 20.0–28.0)
O2 Saturation: 98 %
O2 Saturation: 100 %
PCO2 ART: 32.5 mmHg (ref 32.0–48.0)
PO2 ART: 429 mmHg — AB (ref 83.0–108.0)
Patient temperature: 93.6
TCO2: 19 mmol/L — ABNORMAL LOW (ref 22–32)
TCO2: 22 mmol/L (ref 22–32)
pCO2 arterial: 37.3 mmHg (ref 32.0–48.0)
pH, Arterial: 7.34 — ABNORMAL LOW (ref 7.350–7.450)
pH, Arterial: 7.343 — ABNORMAL LOW (ref 7.350–7.450)
pO2, Arterial: 114 mmHg — ABNORMAL HIGH (ref 83.0–108.0)

## 2017-11-05 LAB — CULTURE, BLOOD (SINGLE)
Culture: NO GROWTH
Special Requests: ADEQUATE

## 2017-11-05 LAB — C DIFFICILE QUICK SCREEN W PCR REFLEX
C DIFFICILE (CDIFF) INTERP: NOT DETECTED
C DIFFICILE (CDIFF) TOXIN: NEGATIVE
C Diff antigen: NEGATIVE

## 2017-11-05 LAB — TROPONIN I: Troponin I: 1.07 ng/mL (ref <0.03)

## 2017-11-05 LAB — MAGNESIUM: Magnesium: 3 mg/dL — ABNORMAL HIGH (ref 1.7–2.4)

## 2017-11-05 LAB — CORTISOL: CORTISOL PLASMA: 55.4 ug/dL

## 2017-11-05 MED ORDER — DEXTROSE 50 % IV SOLN
INTRAVENOUS | Status: AC
  Filled 2017-11-05: qty 50

## 2017-11-05 MED ORDER — ATROPINE SULFATE 1 MG/10ML IJ SOSY
PREFILLED_SYRINGE | INTRAMUSCULAR | Status: AC
  Administered 2017-11-05: 0.5 mg
  Filled 2017-11-05: qty 10

## 2017-11-05 MED ORDER — ALBUMIN HUMAN 25 % IV SOLN
25.0000 g | Freq: Once | INTRAVENOUS | Status: AC
  Administered 2017-11-05: 25 g via INTRAVENOUS
  Filled 2017-11-05: qty 50

## 2017-11-05 MED ORDER — SODIUM CHLORIDE 0.9 % IV BOLUS
1000.0000 mL | Freq: Once | INTRAVENOUS | Status: AC
  Administered 2017-11-05: 1000 mL via INTRAVENOUS

## 2017-11-05 MED ORDER — SODIUM CHLORIDE 0.9 % IV SOLN
1.2500 ng/kg/min | INTRAVENOUS | Status: DC
  Filled 2017-11-05: qty 1

## 2017-11-05 MED ORDER — STERILE WATER FOR INJECTION IV SOLN
INTRAVENOUS | Status: DC
  Administered 2017-11-05 (×4): via INTRAVENOUS_CENTRAL
  Filled 2017-11-05 (×21): qty 150

## 2017-11-05 MED ORDER — DEXTROSE 50 % IV SOLN
INTRAVENOUS | Status: AC
  Administered 2017-11-05: 25 mL
  Filled 2017-11-05: qty 50

## 2017-11-05 MED ORDER — NOREPINEPHRINE 16 MG/250ML-% IV SOLN
0.0000 ug/min | INTRAVENOUS | Status: DC
  Administered 2017-11-05: 20 ug/min via INTRAVENOUS
  Filled 2017-11-05 (×3): qty 250

## 2017-11-05 MED ORDER — NOREPINEPHRINE 4 MG/250ML-% IV SOLN
INTRAVENOUS | Status: AC
  Administered 2017-11-05
  Filled 2017-11-05: qty 500

## 2017-11-05 MED ORDER — DEXTROSE 10 % IV SOLN
INTRAVENOUS | Status: DC
  Administered 2017-11-05: 09:00:00 via INTRAVENOUS

## 2017-11-05 MED ORDER — LOPERAMIDE HCL 2 MG PO CAPS
2.0000 mg | ORAL_CAPSULE | Freq: Once | ORAL | Status: AC
  Administered 2017-11-05: 2 mg via ORAL
  Filled 2017-11-05: qty 1

## 2017-11-05 MED ORDER — DEXTROSE 50 % IV SOLN
25.0000 mL | Freq: Once | INTRAVENOUS | Status: AC
  Administered 2017-11-05: 25 mL via INTRAVENOUS

## 2017-11-05 MED ORDER — SODIUM BICARBONATE 8.4 % IV SOLN
INTRAVENOUS | Status: AC
  Administered 2017-11-05: 50 meq
  Filled 2017-11-05: qty 100

## 2017-11-05 MED ORDER — ATROPINE SULFATE 1 MG/10ML IJ SOSY
PREFILLED_SYRINGE | INTRAMUSCULAR | Status: AC
  Filled 2017-11-05: qty 10

## 2017-11-05 MED ORDER — STERILE WATER FOR INJECTION IV SOLN
INTRAVENOUS | Status: DC
  Administered 2017-11-05 (×4): via INTRAVENOUS_CENTRAL
  Filled 2017-11-05 (×12): qty 150

## 2017-11-05 MED ORDER — VASOPRESSIN 20 UNIT/ML IV SOLN
0.0300 [IU]/min | INTRAVENOUS | Status: DC
  Administered 2017-11-05: 0.03 [IU]/min via INTRAVENOUS
  Filled 2017-11-05 (×2): qty 2

## 2017-11-05 MED ORDER — DOPAMINE-DEXTROSE 3.2-5 MG/ML-% IV SOLN
0.0000 ug/kg/min | INTRAVENOUS | Status: DC
  Administered 2017-11-05: 10 ug/kg/min via INTRAVENOUS
  Filled 2017-11-05 (×2): qty 250

## 2017-11-05 MED FILL — Medication: Qty: 1 | Status: AC

## 2017-11-07 ENCOUNTER — Other Ambulatory Visit: Payer: Self-pay

## 2017-11-07 LAB — POCT ACTIVATED CLOTTING TIME: Activated Clotting Time: 235 seconds

## 2017-11-07 LAB — GLUCOSE, CAPILLARY: Glucose-Capillary: 113 mg/dL — ABNORMAL HIGH (ref 70–99)

## 2017-11-07 MED FILL — Medication: Qty: 1 | Status: AC

## 2017-11-07 NOTE — Patient Outreach (Signed)
[place]) Care Management  11/07/2017  Diana Walters [birth year] [id number]   Received update notification from Natividad Brood, RN hospital liaison that patient is deceased.   PLAN:  Patient previously closed to [facility] care management.   [name] RN,BSN,CCM [facility] Telephonic  [phone]

## 2017-11-10 ENCOUNTER — Telehealth: Payer: Self-pay | Admitting: Pulmonary Disease

## 2017-11-10 NOTE — Telephone Encounter (Signed)
Received Death certificate from [employer] for cremation, sending to Dr Lynetta Mare at [facility] for signing. 11/10/17  LM

## 2017-11-14 ENCOUNTER — Telehealth: Payer: Self-pay

## 2017-11-14 NOTE — Telephone Encounter (Signed)
On 11/14/17 I received the d/c back from Doctor Agarwala. I got the d/c ready and called the funeral home to let them know the d/c is ready for pickup.

## 2017-11-15 NOTE — Progress Notes (Signed)
CPT held due to patient coding.

## 2017-11-15 NOTE — Progress Notes (Signed)
Diana Walters  DJS:[id number] DOB: Dec 10, 1955 DOA: 10/20/2017 PCP: Renato Shin, MD    LOS: 6 days   Reason for Consult / Chief Complaint:  Acute Respiratory Distress  Consulting MD and date:  TRH Dhungel, 9/15  HPI/Summary of hospital stay:  62 year old female with PMH of Tobacco Use, CVA, Anemia, HLD, Thoracic Aortic Aneurysm s/p endovascular stent graft 08/2017, anxiety, HTN, COPD, RA, CKD stage IV with new right arm fistula, Diastolic HF  Presents to ED On 9/15 with dyspnea. Admitted with hypoxemic respiratory failure, acute pulmonary edema, AoCKD, metabolic acidosis.  Started on BiPAP but failed and required intubation.  Did not respond to diuresis; therefore, started CRRT overnight.  9/15 admit, intubated 9/16 started on CRRT. Hypotensive with increasing pressor requirements, but BP actually higher by R femoral arterial line. Pressors weaned off. NMB started for dyssynchrony. 9/16 TTE >> - Left ventricle: The cavity size was normal. There was mild   concentric hypertrophy. Systolic function was mildly to   moderately reduced. The estimated ejection fraction was in the   range of 40% to 45%. Diffuse hypokinesis. There is hypokinesis of   the mid-apicalanteroseptal, inferolateral, and apical myocardium.   Features are consistent with a pseudonormal left ventricular   filling pattern, with concomitant abnormal relaxation and   increased filling pressure (grade 2 diastolic dysfunction).   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 - Ventricular septum: The contour showed diastolic flattening. - Aortic valve: There was mild regurgitation. - Mitral valve: There was trivial regurgitation. - Tricuspid valve: There was moderate regurgitation. - Pulmonary arteries: Systolic pressure was moderately to severely   increased. PA peak pressure: 66 mm Hg (S). Impressions:  - EF is reduced when compared to prior (50-55%)  9/17 dopamine started for episodes of  bradycardia; remains on CRRT/ NMB 9/18 discordance in cuff vs aline pressures; remains on dopamine for bradycardia, off other pressors; NMB off, UF kept even 9/19 dopamine off/ HR stable; UF +50 ml/hr; sedation weaning  Micro: 9/15 BC >> ngtd 9/15 Respiratory culture >> normal flora 9/15 MRSA PCR negative 9/16 Respiratory panel negative  ABX:  9/15 vanc >>9/16 9/15 cefepime >> 9/20  Subjective:  Copious diarrhea overnight with 1 L of effluent in fecal management system.  Hemodynamic instability with marked hypertension with stimulation followed by pressor dependent shock.  Objective   Blood pressure (!) 104/44, pulse (!) 125, temperature (!) 93.6 F (34.2 C), temperature source Axillary, resp. rate (!) 24, height 5\' 7"  (1.702 m), weight 60.8 kg, SpO2 100 %. CVP:  [0 mmHg] 0 mmHg  Vent Mode: PRVC FiO2 (%):  [30 %-100 %] 50 % Set Rate:  [20 bmp] 20 bmp Vt Set:  [450 mL] 450 mL PEEP:  [5 cmH20] 5 cmH20 Plateau Pressure:  [18 cmH20-30 cmH20] 28 cmH20   Intake/Output Summary (Last 24 hours) at 11/17/17 2303 Last data filed at 11/17/2017 2200 Gross per 24 hour  Intake 3426.57 ml  Output 1513 ml  Net 1913.57 ml   Filed Weights   11/03/17 0456 11/04/17 0402 Nov 17, 2017 0500  Weight: 66.2 kg 60.2 kg 60.8 kg   Examination: General:  Critically ill adult female on MV in no distress profoundly cachectic. HEENT: MM pink/moist, pupils 3/reactive, ETT/ OGT, improved scleral edema Neuro: More lethargic today with no response to pain. CV:  2/6 SEM PULM: even/non-labored, lungs bilaterally clear, faint left  GI: distended., rectal tube in place, no UOP Extremities: cool/dry, improved anasarca more dependent UE Skin: no rashes  Consults: date of consult/date signed off & final recs:  PCCM 9/15 > Renal 9/16 > started on CRRT Cards 9/16  Assessment & Plan:   Acute Hypoxic Respiratory Distress in setting of cardiogenic pulmonary edema secondary to decompensated heart failure with  acute on chronic kidney disease vs ESRD + PNA. H/O COPD, Tobacco Use  - stable CXR 9/19; clear infiltrates  P:  Continue full MV support Currently weaning well but mental status prevents extubation at this time VAP measures  Trend CXR Continue BD  PNA P:  Will check PCT today, if down from prior and given cleared CXR with negative cultures will d/c cefepime, currently on day 6.    Shock - presumed multifactorial; septic in setting PNA and cardiogenic in setting decompensated heart failure (Echo from July 2019 with EF 50 - 55%, G2DD). Sinus Bradycardia - thought related to metabolic issues now resolved Elevated troponin - - stable trend in troponin 0.51 > 0.43 > 0.47, likely demand ischemia Hx AAA s/p stent graft July 2019, HTN, HLD PAF - TTE 9/16 with LVEF 40-45%, PAP 66 mm Hg Plan  - HR and BP stable currently off pressors/ inotrope's - goal MAP > 55 given advanced CKD  - will need to consider anticoagulation per Cards recs for CHADSvasc 5  - Goal K > 4; Mag > 2  Acute on Chronic Stage IV Kidney Disease vs Progression to ESRD Anion Gap Metabolic Acidosis - resolved  Hypophosphatemia  - remains anuric  Plan  Continue CRRT per Nephrology, possibly transition to [facility] tomorrow Ongoing Kphos replete, additional Kphos 30 mmol now  BMET BID on CRRT and monitor closely given new diarrhea  Anemia in setting of chronic disease. - stable h/h Plan  Transfuse for Hgb < 7 Trending CBC  Hyperglycemia - no hx DM. Plan  SSI,  sensitive  CBG q 4  Sedation Needs due to mechanical ventilation. H/O CVA, RA -tolerating vent without sedation needs  - slow improvement in mental status from yesterday, remains globally weak s/p 48hr of NMB on 9/17 Plan  Sedation stopped to allow good mental status eval Prn sedation only PT consult when appropriate   Constipation - resolved, now with > 3L liquid stool  - on TF P:  Stop scheduled colace UF now even to prevent  dehydration  Disposition / Summary of Today's Plan 11/18/17   Continue CRRT, even UF given large output diarrhea Allow patient to wake up / monitor neuro status Ongoing SBT- no extubation till more awake    DVT prophylaxis: Heparin iv for CRRT, SQ heparin/ SCDs GI prophylaxis: Pepcid  Diet: high nutritional risk. TF at goal  Mobility: Bedrest  Code Status: Full Code  Family Communication: Daughter Sharyn Lull updated at bedside. I did approach concerns of meaningful recovery given critical illness with ongoing chronic issues and now progression of CKD but daughter remains hopeful. Will have to see how patient progresses over the weekend and readdress.   Labs and ancillary test    Recent Labs  Lab 10/18/2017 1024  11/01/17 0418 11/02/17 0330 11/03/17 0412 11/04/17 0330 November 18, 2017 0516 11-18-17 2120  WBC 17.5*   < > 8.9 7.3 11.3* 13.4* 19.5*  --   NEUTROABS 15.2*  --   --  6.1  --   --   --   --   HGB 9.5*   < > 7.5* 7.7* 7.9* 8.3* 9.2* 6.1*  HCT 31.3*   < > 23.7* 24.7* 25.5* 26.5* 30.1* 18.0*  MCV 86.5   < >  81.7 82.9 82.8 83.3 86.0  --   PLT 376   < > 176 183 181 209 188  --    < > = values in this interval not displayed.   Basic Metabolic Panel: improving acidosis with effective dialysis. Recent Labs  Lab 11/01/17 1600 11/02/17 0330  11/03/17 0412  11/04/17 0330 11/04/17 1625 11-24-17 0221 11/24/17 0516 Nov 24, 2017 0518 24-Nov-2017 0956 11/24/17 1533 Nov 24, 2017 2120  NA 141 140   < > 136   < > 137 139 139  --  138 138 138  137 131*  K 4.0 3.7   < > 3.6   < > 4.0 4.1 5.4*  --  5.2* 4.7 4.5  4.5 6.6*  CL 105 102   < > 105   < > 106 104 104  --  102 103 82*  80* 84*  CO2 28 31   < > 24   < > 21* 22 16*  --  15* 20* 34*  34*  --   GLUCOSE 95 105*   < > 112*   < > 134* 126* 96  --  82 96 93  93 122*  BUN 12 8   < > 9   < > 11 8 12   --  11 5* <5*  <5* 8  CREATININE 1.11* 0.91   < > 0.72   < > 0.99 0.68 0.96  --  0.94 0.64 0.69  0.72 1.20*  CALCIUM 7.3* 7.6*   < > 7.4*   < >  8.0* 8.1* 8.4*  --  8.4* 8.0* 6.6*  6.6*  --   MG 2.4 2.4  --  2.5*  --  2.6*  --   --  3.0*  --   --   --   --   PHOS 2.3* 1.4*  1.5*   < > <1.0*   < > 1.4* 1.9* 3.6  --  4.0  --  3.6  --    < > = values in this interval not displayed.   GFR: Estimated Creatinine Clearance: 46.1 mL/min (A) (by C-G formula based on SCr of 1.2 mg/dL (H)). Recent Labs  Lab 10/31/2017 1948 10/21/2017 2248 10/31/17 0519 10/31/17 1934 11/01/17 0418 11/01/17 0747 11/02/17 0330 11/03/17 0412 11/04/17 0330 11/04/17 1625 2017-11-24 0516  PROCALCITON 21.07  --  30.69  --  29.14  --   --   --   --  6.25  --   WBC  --   --  11.3*  --  8.9  --  7.3 11.3* 13.4*  --  19.5*  LATICACIDVEN 2.1* 2.7*  --  3.2*  --  1.1  --   --   --   --   --    Persistent lactic acidosis due to impaired renal clearance.  Liver Function Tests: Recent Labs  Lab 11/04/17 0330 11/04/17 1625 11/24/17 0221 11/24/2017 0518 11-24-2017 1533  ALBUMIN 2.4* 2.7* 2.7* 2.7* 2.8*   No results for input(s): LIPASE, AMYLASE in the last 168 hours. No results for input(s): AMMONIA in the last 168 hours. ABG  Compensated respiratory acidosis.     Component Value Date/Time   PHART 7.343 (L) 11/24/2017 2116   PCO2ART 37.3 11-24-2017 2116   PO2ART 429.0 (H) November 24, 2017 2116   HCO3 20.9 24-Nov-2017 2116   TCO2 20 (L) 11/24/2017 2120   ACIDBASEDEF 5.0 (H) 11/24/17 2116   O2SAT 100.0 11-24-17 2116    Coagulation Profile: No results for input(s): INR, PROTIME in the last  168 hours. Cardiac Enzymes: consistent with demand/sepsis related cardiomyopathy Recent Labs  Lab 10/31/17 1208 11/01/17 1111 11/01/17 1600 11/01/17 2319 November 21, 2017 1644  TROPONINI 1.22* 0.51* 0.43* 0.47* 1.07*    CBG:  Stress hyperglycemia with adequate control.  Recent Labs  Lab 11/21/17 1048 11-21-2017 1217 21-Nov-2017 1541 11/21/2017 1956 11-21-17 2031  GLUCAP 65* 60* 77 31* 154*   C. Difficile negative.  Abdominal XR: gastric distention.  CRITICAL CARE Performed  by: Kipp Brood   Total critical care time: 45 minutes  Critical care time was exclusive of separately billable procedures and treating other patients.  Critical care was necessary to treat or prevent imminent or life-threatening deterioration.  Critical care was time spent personally by me on the following activities: development of treatment plan with patient and/or surrogate as well as nursing, discussions with consultants, evaluation of patient's response to treatment, examination of patient, obtaining history from patient or surrogate, ordering and performing treatments and interventions, ordering and review of laboratory studies, ordering and review of radiographic studies, pulse oximetry and re-evaluation of patient's condition.      Kipp Brood, MD Staff Critical Care Physician [facility] Pager: [phone] Off hours: [phone]

## 2017-11-15 NOTE — Progress Notes (Signed)
Hypoglycemic Event  CBG: 31  Treatment: D50 87ml IV   Symptoms: none  Follow-up CBG: Time: 2031 CBG Result:154  Possible Reasons for Event: NPO  Comments/MD notified:hypoglycemic protocol    Arthur Aydelotte, [facility] Medico

## 2017-11-15 NOTE — Progress Notes (Signed)
PCCM Interval Note  Patient noted to be bradycardiac (HR 30-40) and hypotensive (Systolic 73-22). Given Atropine, EPI, and 2 amps of Bicarb with improvement. Levophed gtt restarted. A-line placed in right femoral. ABG and Labs pending.   Hayden Pedro, AGACNP-BC [facility]  Pgr: [phone]  PCCM Pgr: [phone]

## 2017-11-15 NOTE — Procedures (Signed)
Arterial Catheter Insertion Procedure Note Diana Walters [id number] 1955-05-12  Procedure: Insertion of Arterial Catheter  Indications: Blood pressure monitoring and Frequent blood sampling  Procedure Details Consent: Unable to obtain consent because of emergent medical necessity. Time Out: Verified patient identification, verified procedure, site/side was marked, verified correct patient position, special equipment/implants available, medications/allergies/relevent history reviewed, required imaging and test results available.  Performed  Maximum sterile technique was used including antiseptics, cap, gloves, gown, hand hygiene, mask and sheet. Skin prep: Chlorhexidine; local anesthetic administered 22 gauge catheter was inserted into right femoral artery using the Seldinger technique. ULTRASOUND GUIDANCE USED: YES Evaluation Blood flow good; BP tracing good. Complications: No apparent complications.  Hayden Pedro, AGACNP-BC [facility]  Pgr: [phone]  PCCM Pgr: [phone]

## 2017-11-15 NOTE — Progress Notes (Signed)
[place] Progress Note Patient Name: Diana Walters DOB: 02-20-1955 MRN: [id number]   Date of Service  19-Nov-2017  HPI/Events of Note  Profuse diarrhea  eICU Interventions  Imodium 2 mg via NG tube x 1        Haynes Giannotti U Ladene Allocca November 19, 2017, 1:40 AM

## 2017-11-15 NOTE — Progress Notes (Signed)
   2017/11/24 2200  Clinical Encounter Type  Visited With Patient and family together  Visit Type Follow-up  Referral From Nurse  Spiritual Encounters  Spiritual Needs Emotional  Stress Factors  Patient Stress Factors None identified  Family Stress Factors Exhausted;Health changes;Loss of control   Chaplain responded to page. Nurse stated family needs possible support. I offered family spiritual support and ministry of presence. Chaplain available as needed.   Chaplain Fidel Levy

## 2017-11-15 NOTE — Progress Notes (Signed)
Pt asystole on monitor. Lung and heart auscultated. Pt pronounced at 2256. Pt's family at bedside.

## 2017-11-15 NOTE — Progress Notes (Signed)
   November 27, 2017 2300  Clinical Encounter Type  Visited With Family  Visit Type Follow-up  Referral From Nurse  Spiritual Encounters  Spiritual Needs Emotional;Grief support  Stress Factors  Family Stress Factors Loss of control;Loss;Major life changes;Exhausted   Responded to Nurse page. Nurse stated the PT expired and was needing Chaplain for family. Family was in tears for their loss. I offered spiritual care with ministry of presence, words of encouragement and prayer.   Chaplain Fidel Levy

## 2017-11-15 NOTE — Progress Notes (Signed)
Family at bedside. Patient noted to be asystole with absent heart tones at 2251. Family notified of passing.   Hayden Pedro, AGACNP-BC [facility]  Pgr: [phone]  PCCM Pgr: [phone]

## 2017-11-15 NOTE — Progress Notes (Signed)
   11-09-17 2100  Clinical Encounter Type  Visited With Patient  Visit Type Initial  Referral From Nurse  Stress Factors  Patient Stress Factors None identified  Family Stress Factors None identified   Responded to Code Blue. PT unresponsive. Chaplain offered spiritual support with Ministry of presence. No family present at the time.  [place].

## 2017-11-15 NOTE — Death Summary Note (Signed)
DEATH SUMMARY   Patient Details  Name: Diana Walters MRN: [id number] DOB: 01-Jul-1955  Admission/Discharge Information   Admit Date:  11-08-17  Date of Death: Date of Death: Nov 14, 2017  Time of Death: Time of Death: 05/21/54  Length of Stay: 7  Referring Physician: Renato Shin, MD   Reason(s) for Hospitalization  Altered mental status and hypoxic respiratory  Diagnoses  Preliminary cause of death:  Secondary Diagnoses (including complications and co-morbidities):  Principal Problem:   Acute respiratory failure with hypoxia [facility]) Active Problems:   Aneurysm of thoracic aorta (HCC)   Rheumatoid arthritis ([place])   History of stroke   Seizure disorder ([place])   CKD (chronic kidney disease) stage 4, GFR 15-29 ml/min (HCC)   Tobacco abuse   HCAP (healthcare-associated pneumonia)   Chronic systolic CHF (congestive heart failure) ([place])   Malnutrition of moderate degree   Pressure injury of skin   Brief Hospital Course (including significant findings, care, treatment, and services provided and events leading to death)  Diana Walters is a 62 y.o. 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  She presented with hypercapnic hypoxic respiratory failure and metabolic acidosis.  She failed BiPAP and was intubated.  She remained hemodynamically unstable requiring increasing doses of vasopressors. While she initially began to improve she suddenly developed copious diarrhea.  Still was negative and abdominal film was unremarkable.  She had made very little neurological recovery and was unable to wean and her pressor requirements continued to increase.  The situation was discussed with her daughter who agreed to transition her to comfort care.   Pertinent Labs and Studies  Significant Diagnostic Studies Dg Abd 1 View  Result Date: 2017/11/14 CLINICAL DATA:  Diarrhea. EXAM: ABDOMEN - 1 VIEW COMPARISON:  Radiograph dated 06/05/2017 FINDINGS: Bowel gas  pattern is normal with minimal air in the bowel. NG tube tip is in the distal stomach. Double lumen catheter in the region of the left common iliac vein. Aortic atherosclerosis.  Calcified fibroids. Stent graft in the  thoracic aorta. IMPRESSION: No acute abnormality of the abdomen. Electronically Signed   By: Lorriane Shire M.D.   On: November 14, 2017 10:15   Dg Chest Port 1 View  Result Date: 11/03/2017 CLINICAL DATA:  Respiratory failure. EXAM: PORTABLE CHEST 1 VIEW COMPARISON:  11/01/2017. FINDINGS: Endotracheal tube, NG tube, right subclavian line stable position. Prior median sternotomy and CABG. Heart size stable. Aortic stent graft in stable position. Previously identified bilateral pulmonary infiltrates/edema has cleared. No prominent pleural effusion or pneumothorax. No acute bony abnormality. IMPRESSION: 1.  Lines and tubes stable position. 2. Prior CABG. Aortic stent graft in stable position. Heart size stable. 3. No acute cardiopulmonary disease identified. Bilateral pulmonary infiltrates/edema has cleared. Electronically Signed   By: Marcello Moores  Register   On: 11/03/2017 09:28   Dg Chest Port 1 View  Result Date: 11/01/2017 CLINICAL DATA:  Adult respiratory distress syndrome, endotracheal tube EXAM: PORTABLE CHEST 1 VIEW COMPARISON:  Portable chest x-ray from earlier in the day FINDINGS: There is little change in diffuse airspace disease. There may be tiny pleural effusions present. The tip of the endotracheal tube is approximately 5.5 cm above the carina. Right central venous line tip overlies the lower SVC and NG tube extends below the hemidiaphragm. Endovascular thoracic aortic stent remains with ectasia distally. Cardiomegaly is stable. IMPRESSION: 1. Endotracheal tube tip approximately 5.5 cm above the carina. 2. Diffuse airspace disease remains.  Cannot exclude tiny effusions. Electronically  Signed   By: Ivar Drape M.D.   On: 11/01/2017 09:08   Dg Chest Port 1 View  Result Date:  11/01/2017 CLINICAL DATA:  Respiratory failure EXAM: PORTABLE CHEST 1 VIEW COMPARISON:  Portable chest x-ray of 10/16/2017 FINDINGS: Aeration has improved although there is persistent bilateral airspace disease primarily in the lower lobes. Endovascular stent remains throughout the thoracic aorta with ectasia distally again noted. The carina is very difficult to visualize due to overlying stent and mediastinal wires, but the tip of the endotracheal tube is approximately 5.6 cm above the carina. Right central venous line tip overlies the lower SVC and NG tube extends below the hemidiaphragm. Cardiomegaly is stable. IMPRESSION: 1. Improved aeration.  Persistent bilateral airspace disease. 2. Endotracheal tube at least 5.6 cm above the carina. Electronically Signed   By: Ivar Drape M.D.   On: 11/01/2017 09:07   Dg Chest Port 1 View  Result Date: 10/21/2017 CLINICAL DATA:  Central line placement EXAM: PORTABLE CHEST 1 VIEW COMPARISON:  10/29/2017, 09/08/2017, CT chest 09/08/2017 FINDINGS: Endotracheal tube tip overlies the right mainstem bronchus orifice. Right-sided central venous catheter tip over the cavoatrial junction. Worsening airspace disease in the right hemithorax. No change in diffuse airspace disease left hemithorax. Stable enlarged cardiomediastinal silhouette with extensive aortic stent grafting. IMPRESSION: 1. Endotracheal tube tip encroaches right mainstem bronchus, this was subsequently repositioned on the follow-up film 2. Worsening right greater than left diffuse airspace disease which may reflect asymmetric edema or diffuse infection. Electronically Signed   By: Donavan Foil M.D.   On: 11/01/2017 20:18   Dg Chest Port 1 View  Result Date: 10/24/2017 CLINICAL DATA:  Endotracheal tube adjustment EXAM: PORTABLE CHEST 1 VIEW COMPARISON:  11/10/2017, 09/08/2017 FINDINGS: Post sternotomy changes and extensive aortic stent graft. Right-sided central venous catheter tip overlies the cavoatrial  region. Endotracheal tube tip repositioned with tip now 1.9 cm superior to the carina. Hyperinflation with extensive right greater than left airspace disease, which may reflect asymmetric edema or diffuse infection. Soft tissue density and mediastinal widening as before. IMPRESSION: 1. Endotracheal tube tip now 1.9 cm superior to the carina 2. Extensive right greater than left airspace disease which may reflect pulmonary edema or diffuse infection 3. Stable cardiomediastinal silhouette with enlarged mediastinal silhouette and extensive aortic stent grafting. Electronically Signed   By: Donavan Foil M.D.   On: 11/09/2017 20:17   Dg Chest Portable 1 View  Result Date: 11/02/2017 CLINICAL DATA:  Shortness of breath and diaphoretic for the last 2 days. EXAM: PORTABLE CHEST 1 VIEW COMPARISON:  Chest x-ray dated 09/08/2017. FINDINGS: Large dense airspace opacities within the RIGHT lower lung, and overlying the RIGHT hilum, pneumonia versus asymmetric edema. LEFT lung remains relatively clear. No pneumothorax seen. No pleural effusions seen. Cardiomediastinal silhouette appears stable in size and configuration. Aortic stent graft appears stable in configuration. No acute or suspicious osseous finding. IMPRESSION: 1. New large dense airspace opacities within the RIGHT lower lung and overlying the RIGHT hilum. Findings are consistent with pneumonia or asymmetric pulmonary edema. LEFT lung remains relatively clear. 2. Cardiomediastinal silhouette appears stable. Aortic stent graft appears stable in configuration. Electronically Signed   By: Franki Cabot M.D.   On:  11:20    Microbiology Recent Results (from the past 240 hour(s))  C difficile quick scan w PCR reflex     Status: None   Collection Time: 2017/11/27  8:56 AM  Result Value Ref Range Status   C Diff antigen NEGATIVE NEGATIVE Final  C Diff toxin NEGATIVE NEGATIVE Final   C Diff interpretation No C. difficile detected.  Final    Comment:  Performed at [facility], [place] [address], [place], [place] [address]    Lab Basic Metabolic Panel: Recent Labs  Lab 11/04/17 1625 November 09, 2017 0221 11/09/2017 0516 2017-11-09 0518 11-09-17 0956 11/09/2017 1533 11/09/17 2120  NA 139 139  --  138 138 138  137 131*  K 4.1 5.4*  --  5.2* 4.7 4.5  4.5 6.6*  CL 104 104  --  102 103 82*  80* 84*  CO2 22 16*  --  15* 20* 34*  34*  --   GLUCOSE 126* 96  --  82 96 93  93 122*  BUN 8 12  --  11 5* <5*  <5* 8  CREATININE 0.68 0.96  --  0.94 0.64 0.69  0.72 1.20*  CALCIUM 8.1* 8.4*  --  8.4* 8.0* 6.6*  6.6*  --   MG  --   --  3.0*  --   --   --   --   PHOS 1.9* 3.6  --  4.0  --  3.6  --    Liver Function Tests: Recent Labs  Lab 11/04/17 1625 11/09/17 0221 11/09/17 0518 2017/11/09 1533  ALBUMIN 2.7* 2.7* 2.7* 2.8*   No results for input(s): LIPASE, AMYLASE in the last 168 hours. No results for input(s): AMMONIA in the last 168 hours. CBC: Recent Labs  Lab 11-09-17 0516 11-09-2017 2120  WBC 19.5*  --   HGB 9.2* 6.1*  HCT 30.1* 18.0*  MCV 86.0  --   PLT 188  --    Cardiac Enzymes: Recent Labs  Lab 09-Nov-2017 1644  TROPONINI 1.07*   Sepsis Labs: Recent Labs  Lab 11/04/17 1625 11-09-2017 0516  PROCALCITON 6.25  --   WBC  --  19.5*    Procedures/Operations  CRRT, central line insertion, art line insertion.   Izaya Netherton 11/11/2017, 3:44 PM

## 2017-11-15 NOTE — Progress Notes (Signed)
CODE BLUE NOTE  Patient Name: Diana Walters   MRN: [id number]   Date of Birth/ Sex: [birth year] , female      Admission Date: 10/25/2017  Attending Provider: Kipp Brood, MD  Primary Diagnosis: Acute respiratory failure with hypoxia [facility])    Indication: Noted to be Bradycardic with HR 30-40. Given Atropine x 1. Shortly after noted to be PEA at 2052 Code blue was subsequently called. At the time of arrival on scene, ACLS protocol was underway.    Technical Description:  - CPR performance duration:  6 minute  - Was defibrillation or cardioversion used? No   - Was external pacer placed? Yes  - Was patient intubated pre/post CPR? Patient intubated prior     Medications Administered: Y = Yes; Blank = No Amiodarone    Atropine  Y   Calcium    Epinephrine  Y x 2   Lidocaine    Magnesium    Norepinephrine    Phenylephrine    Sodium bicarbonate  Y  Vasopressin      Post CPR evaluation:  - Final Status - Was patient successfully resuscitated ? Yes - What is current rhythm? ST - What is current hemodynamic status? Currently on 10 Levophed, 10 Dopamine, 0.03 Vasopressin    Miscellaneous Information:  - Labs sent, including: ISTAT Chem 8, ABG    Patient Daughter called and notified. Now at bedside. Discussed code status and current hemodynamic insatiability including continued hyperkalemia, hypotension, and bradycardia. Additional family arrived to bedside. Decision to continue current care with no escalation and no CPR. EMR updated.   Hayden Pedro, AGACNP-BC [facility]  Pgr: [phone]  PCCM Pgr: [phone]

## 2017-11-15 NOTE — Progress Notes (Signed)
S:Pt had more diarrhea last night as well as significant hypotension requiring increasing pressor support O:BP (!) 104/44 (BP Location: Right Leg)   Pulse 67   Temp 98 F (36.7 C) (Axillary)   Resp (!) 36   Ht '5\' 7"'  (1.702 m)   Wt 60.8 kg   SpO2 (!) 66%   BMI 20.99 kg/m   Intake/Output Summary (Last 24 hours) at 11/15/17 1148 Last data filed at 11-15-17 1100 Gross per 24 hour  Intake 2349.97 ml  Output 2183 ml  Net 166.97 ml   Intake/Output: I/O last 3 completed shifts: In: 3541.8 [I.V.:873.2; Other:70; NG/GT:1815; IV Piggyback:783.5] Out: 8887 [Other:3940; NZVJK:8206]  Intake/Output this shift:  Total I/O In: 350.9 [I.V.:175.5; IV Piggyback:175.3] Out: 276 [Other:276] Weight change: 0.6 kg ORV:IFBPPHKFE/XMDYJWL CVS: no rub Resp: occ rhonchi Abd: distended abd, hypoactive bowel sounds Ext: no edema, RUE AVF +T/B  Recent Labs  Lab 11/02/17 1615 11/03/17 0412 11/03/17 1630 11/04/17 0330 11/04/17 1625 15-Nov-2017 0221 11-15-2017 0518 11-15-17 0956  NA 137 136 137 137 139 139 138 138  K 4.2 3.6 3.8 4.0 4.1 5.4* 5.2* 4.7  CL 102 105 106 106 104 104 102 103  CO2 '27 24 23 ' 21* 22 16* 15* 20*  GLUCOSE 134* 112* 137* 134* 126* 96 82 96  BUN '8 9 9 11 8 12 11 ' 5*  CREATININE 0.83 0.72 0.77 0.99 0.68 0.96 0.94 0.64  ALBUMIN 2.4* 2.2* 2.4* 2.4* 2.7* 2.7* 2.7*  --   CALCIUM 7.5* 7.4* 7.7* 8.0* 8.1* 8.4* 8.4* 8.0*  PHOS 2.4* <1.0* 2.1* 1.4* 1.9* 3.6 4.0  --    Liver Function Tests: Recent Labs  Lab 11/04/17 1625 Nov 15, 2017 0221 11-15-17 0518  ALBUMIN 2.7* 2.7* 2.7*   No results for input(s): LIPASE, AMYLASE in the last 168 hours. No results for input(s): AMMONIA in the last 168 hours. CBC: Recent Labs  Lab 11/08/2017 1024  11/01/17 0418 11/02/17 0330 11/03/17 0412 11/04/17 0330 11/15/2017 0516  WBC 17.5*   < > 8.9 7.3 11.3* 13.4* 19.5*  NEUTROABS 15.2*  --   --  6.1  --   --   --   HGB 9.5*   < > 7.5* 7.7* 7.9* 8.3* 9.2*  HCT 31.3*   < > 23.7* 24.7* 25.5* 26.5*  30.1*  MCV 86.5   < > 81.7 82.9 82.8 83.3 86.0  PLT 376   < > 176 183 181 209 188   < > = values in this interval not displayed.   Cardiac Enzymes: Recent Labs  Lab 10/31/17 0519 10/31/17 1208 11/01/17 1111 11/01/17 1600 11/01/17 2319  TROPONINI 1.24* 1.22* 0.51* 0.43* 0.47*   CBG: Recent Labs  Lab 11-15-17 0401 Nov 15, 2017 0405 Nov 15, 2017 0427 Nov 15, 2017 0809 November 15, 2017 1048  GLUCAP 40* 46* 113* 52* 65*    Iron Studies: No results for input(s): IRON, TIBC, TRANSFERRIN, FERRITIN in the last 72 hours. Studies/Results: Dg Abd 1 View  Result Date: 15-Nov-2017 CLINICAL DATA:  Diarrhea. EXAM: ABDOMEN - 1 VIEW COMPARISON:  Radiograph dated 06/05/2017 FINDINGS: Bowel gas pattern is normal with minimal air in the bowel. NG tube tip is in the distal stomach. Double lumen catheter in the region of the left common iliac vein. Aortic atherosclerosis.  Calcified fibroids. Stent graft in the  thoracic aorta. IMPRESSION: No acute abnormality of the abdomen. Electronically Signed   By: Lorriane Shire M.D.   On: 11/15/17 10:15   . aspirin  81 mg Per Tube Daily  . atorvastatin  40 mg Per Tube  Daily  . B-complex with vitamin C  1 tablet Per Tube Daily  . chlorhexidine gluconate (MEDLINE KIT)  15 mL Mouth Rinse BID  . Chlorhexidine Gluconate Cloth  6 each Topical q morning - 10a  . famotidine  20 mg Per Tube Daily  . heparin injection (subcutaneous)  5,000 Units Subcutaneous Q8H  . insulin aspart  0-9 Units Subcutaneous Q4H  . ipratropium-albuterol  3 mL Nebulization TID  . mouth rinse  15 mL Mouth Rinse 10 times per day    BMET    Component Value Date/Time   NA 138 12/03/2017 0956   NA 141 06/22/2017 1106   K 4.7 12/03/2017 0956   CL 103 12/03/2017 0956   CO2 20 (L) Dec 03, 2017 0956   GLUCOSE 96 Dec 03, 2017 0956   BUN 5 (L) 12/03/2017 0956   BUN 31 (H) 06/22/2017 1106   CREATININE 0.64 December 03, 2017 0956   CREATININE 1.10 06/10/2014 0813   CALCIUM 8.0 (L) December 03, 2017 0956   CALCIUM 7.6 (L)  06/09/2017 0230   GFRNONAA >60 12-03-2017 0956   GFRAA >60 03-Dec-2017 0956   CBC    Component Value Date/Time   WBC 19.5 (H) 12-03-2017 0516   RBC 3.50 (L) December 03, 2017 0516   HGB 9.2 (L) December 03, 2017 0516   HGB 10.3 (L) 06/22/2017 1106   HCT 30.1 (L) 2017-12-03 0516   HCT 31.8 (L) 06/22/2017 1106   PLT 188 12-03-2017 0516   PLT 304 06/22/2017 1106   MCV 86.0 2017/12/03 0516   MCV 77 (L) 06/22/2017 1106   MCH 26.3 03-Dec-2017 0516   MCHC 30.6 12-03-17 0516   RDW 23.3 (H) December 03, 2017 0516   RDW 21.7 (H) 06/22/2017 1106   LYMPHSABS 0.9 11/02/2017 0330   LYMPHSABS 1.9 01/19/2017 1128   MONOABS 0.1 11/02/2017 0330   EOSABS 0.2 11/02/2017 0330   EOSABS 0.3 01/19/2017 1128   BASOSABS 0.0 11/02/2017 0330   BASOSABS 0.0 01/19/2017 1128     12F late stage CKD presenting with rapdily progressive hypoxic and hypercapneic RF.Has a mixed respiratory and metabolic acidosis. Didnot respond to high-dose diuretics.InitiatedCRRT 9/16/19for control of acidosis and ultrafiltration.   Assessment/Plan:  1. AKI/CKD stage 4-remains anuric and isnow ESRD in setting of shock and pressor support. Currently on CVVHD but has had issues with hypotension. 1. tolerating for now and will keep even given low CVP and BP. 2. Acidosis- recurred yesterday and concerning for abdominal ischemia.  Changed replacement fluids to isotonic bicarb and will follow. 3. Shock- multifactoria: cardiogenic with decompensated CHF as well as sepsis from PNA and now possible C. Diff colitis. was offlevophed but required addition of vasopressin and levophed as well as dopamine per PCCM 4. VDRF- per PCCM 5. Anemia- acute on chronic due to critical illness 6. Bradycardia- dopamine added 7. Disposition- poor prognosis given multi-organ failure and persistent hypotension despite pressor supportand lack of improvement clinically. Recommend palliative care consult to help set goals/limits of care.  Given her clinical  decline over the last 24 hours despite maximal medical management, we need to address EOL issues with family   Donetta Potts, MD [employer] [phone]

## 2017-11-15 DEATH — deceased

## 2019-05-08 IMAGING — US US RENAL
1 series · 14 of 25 positions shown · non-contrast
Comparison: CTA of the abdomen on 01/03/2015

CLINICAL DATA: Chronic kidney disease and hypertension.

EXAM:
RENAL / URINARY TRACT ULTRASOUND COMPLETE

[Series 1: us renal · 0.23mm/px · 14 of 37 slices shown]
[im 1/37]
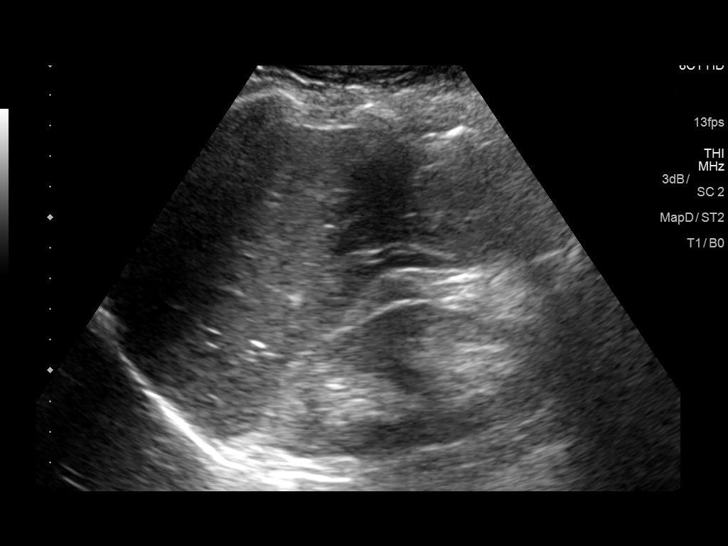
[im 4/37]
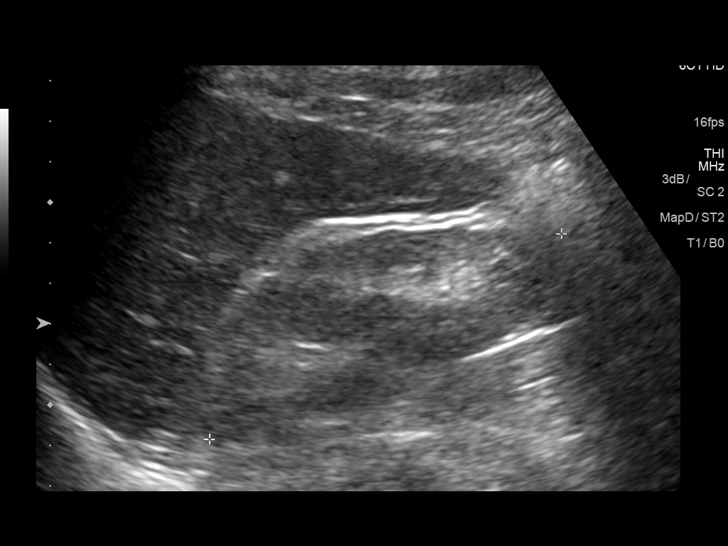
[im 7/37]
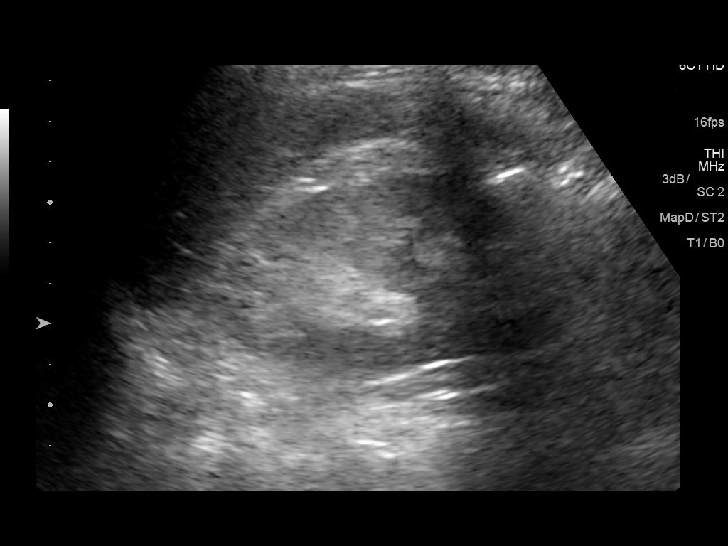
[im 10/37]
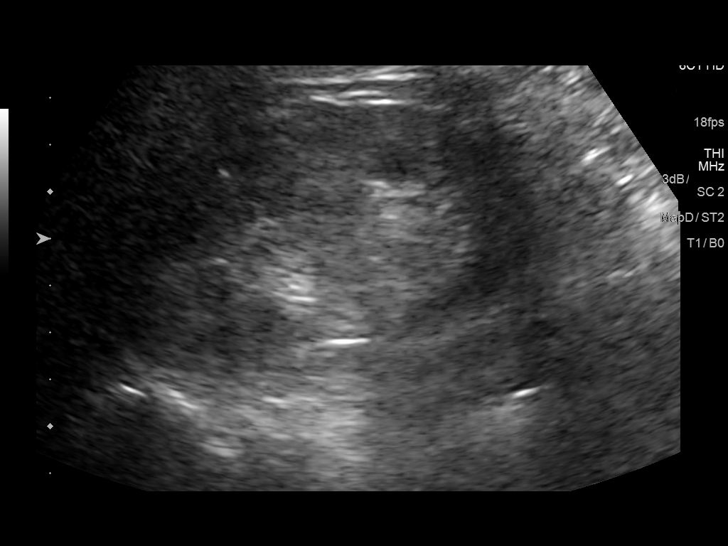
[im 13/37]
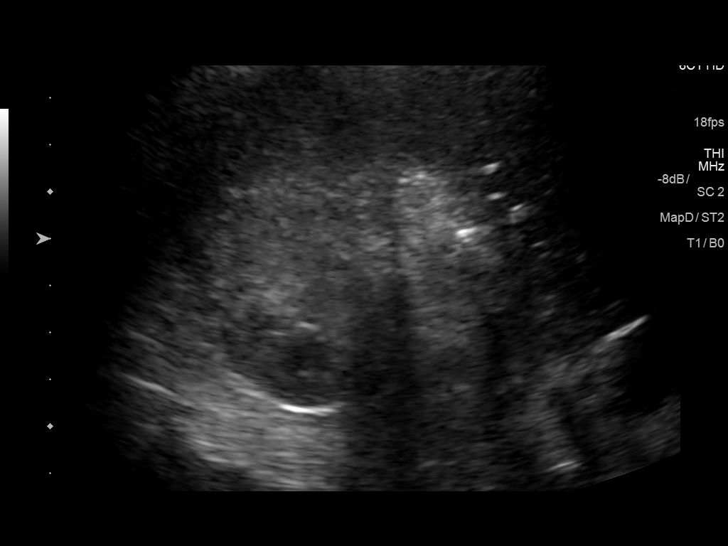
[im 14/37]
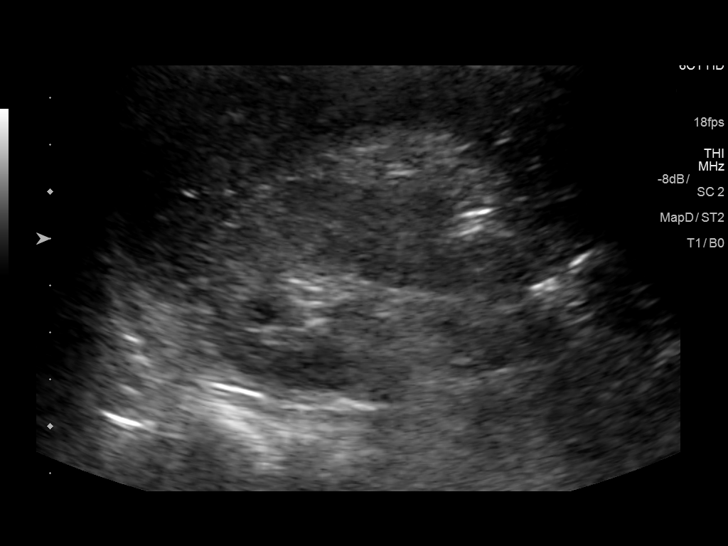
[im 17/37]
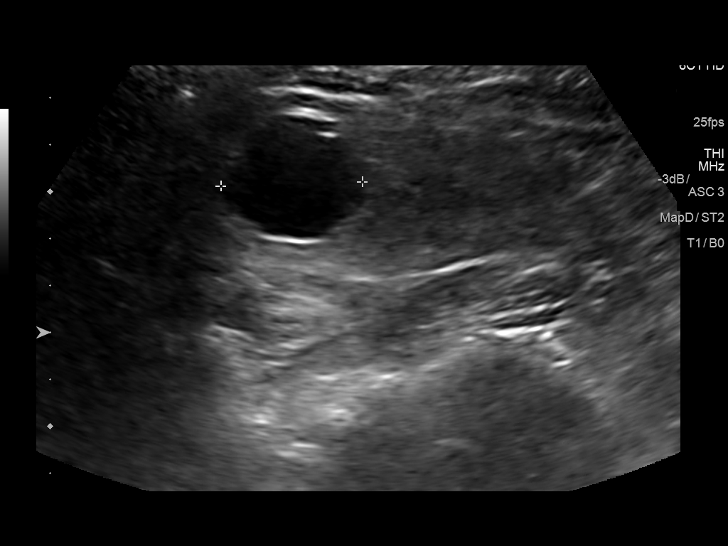
[im 20/37]
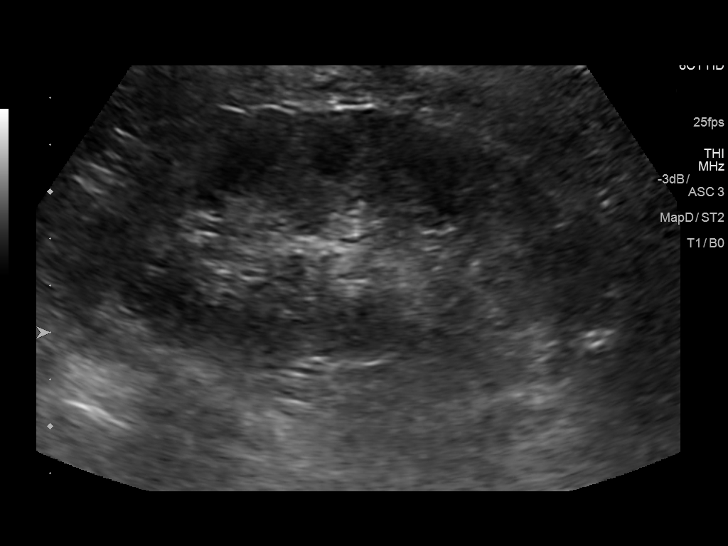
[im 23/37]
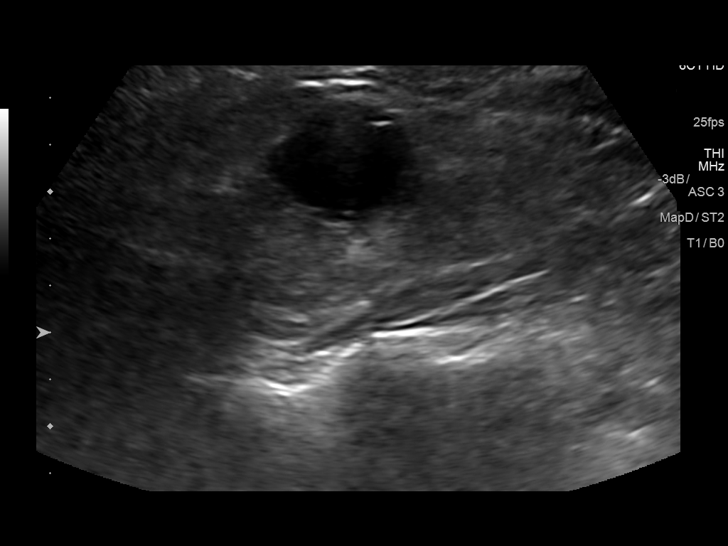
[im 25/37]
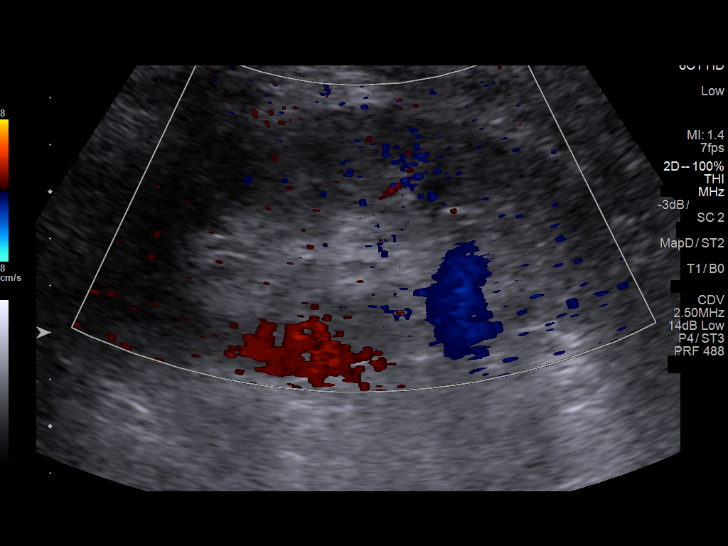
[im 28/37]
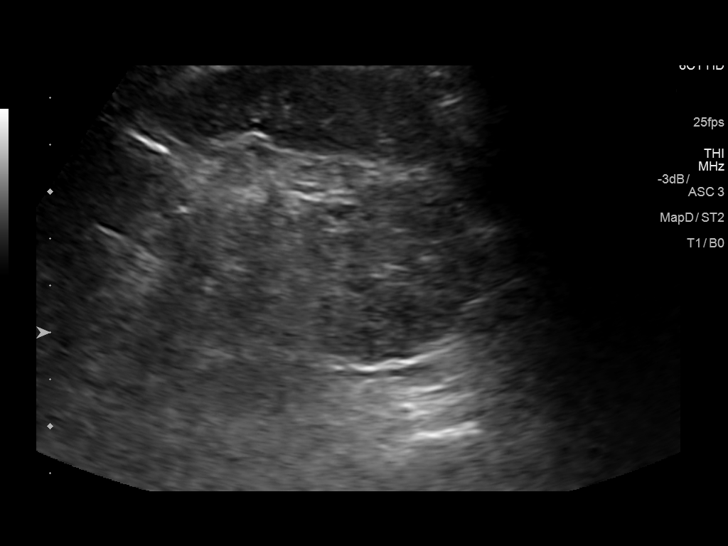
[im 31/37]
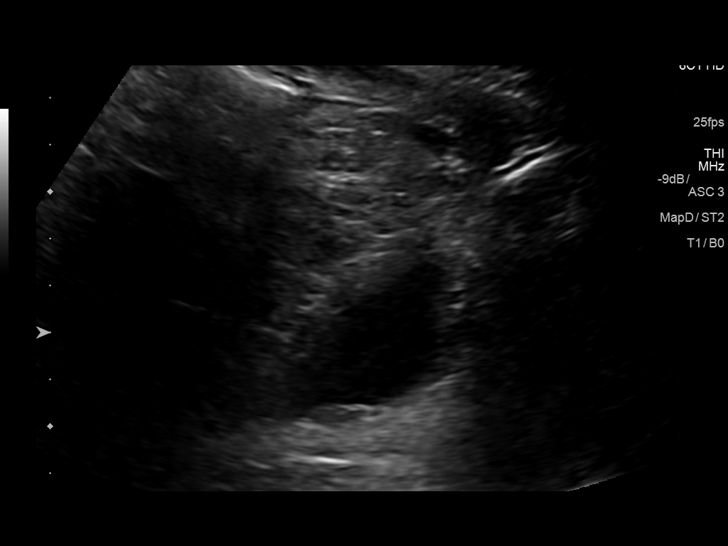
[im 34/37]
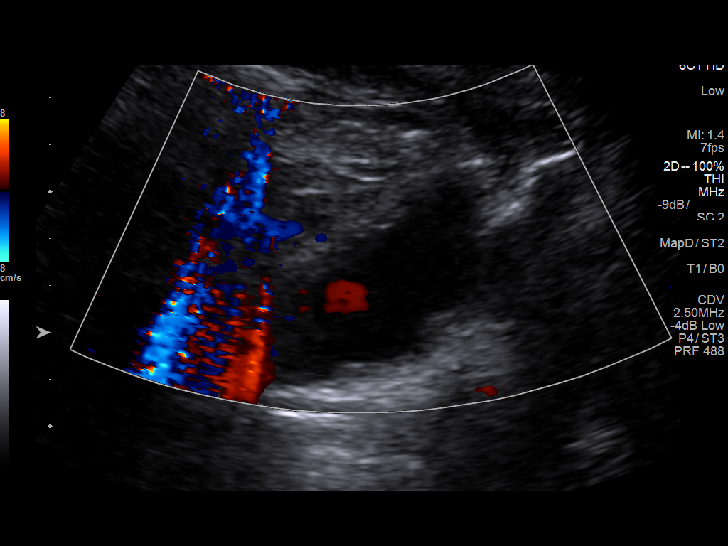
[im 37/37]
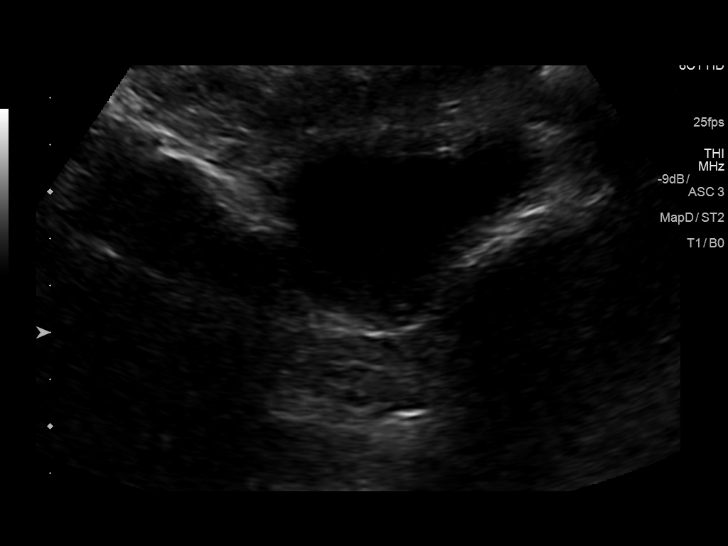

[14 of 25 positions shown; findings below may reference images not displayed]

FINDINGS: Right Kidney:

Length: 10.1 cm. The renal cortex demonstrates diffusely increased
echogenicity. No significant cortical thinning. No focal lesions or
hydronephrosis.

Left Kidney:

Length: 11.4 cm. The renal cortex demonstrates diffusely increased
cortical echogenicity without significant cortical thinning. Simple
cyst of the interpolar kidney measures 3.3 cm in greatest diameter.
No hydronephrosis or solid masses identified.

Bladder:

Appears normal for degree of bladder distention.
IMPRESSION: Bilateral echogenic kidneys without hydronephrosis. Simple cysts of
left kidney.

## 2019-11-14 IMAGING — CR DG CHEST 2V
3 series · 3 of 3 positions shown · non-contrast
Comparison: 06/09/2017

CLINICAL DATA: Pain between shoulders

EXAM:
CHEST - 2 VIEW

[w chest lat]
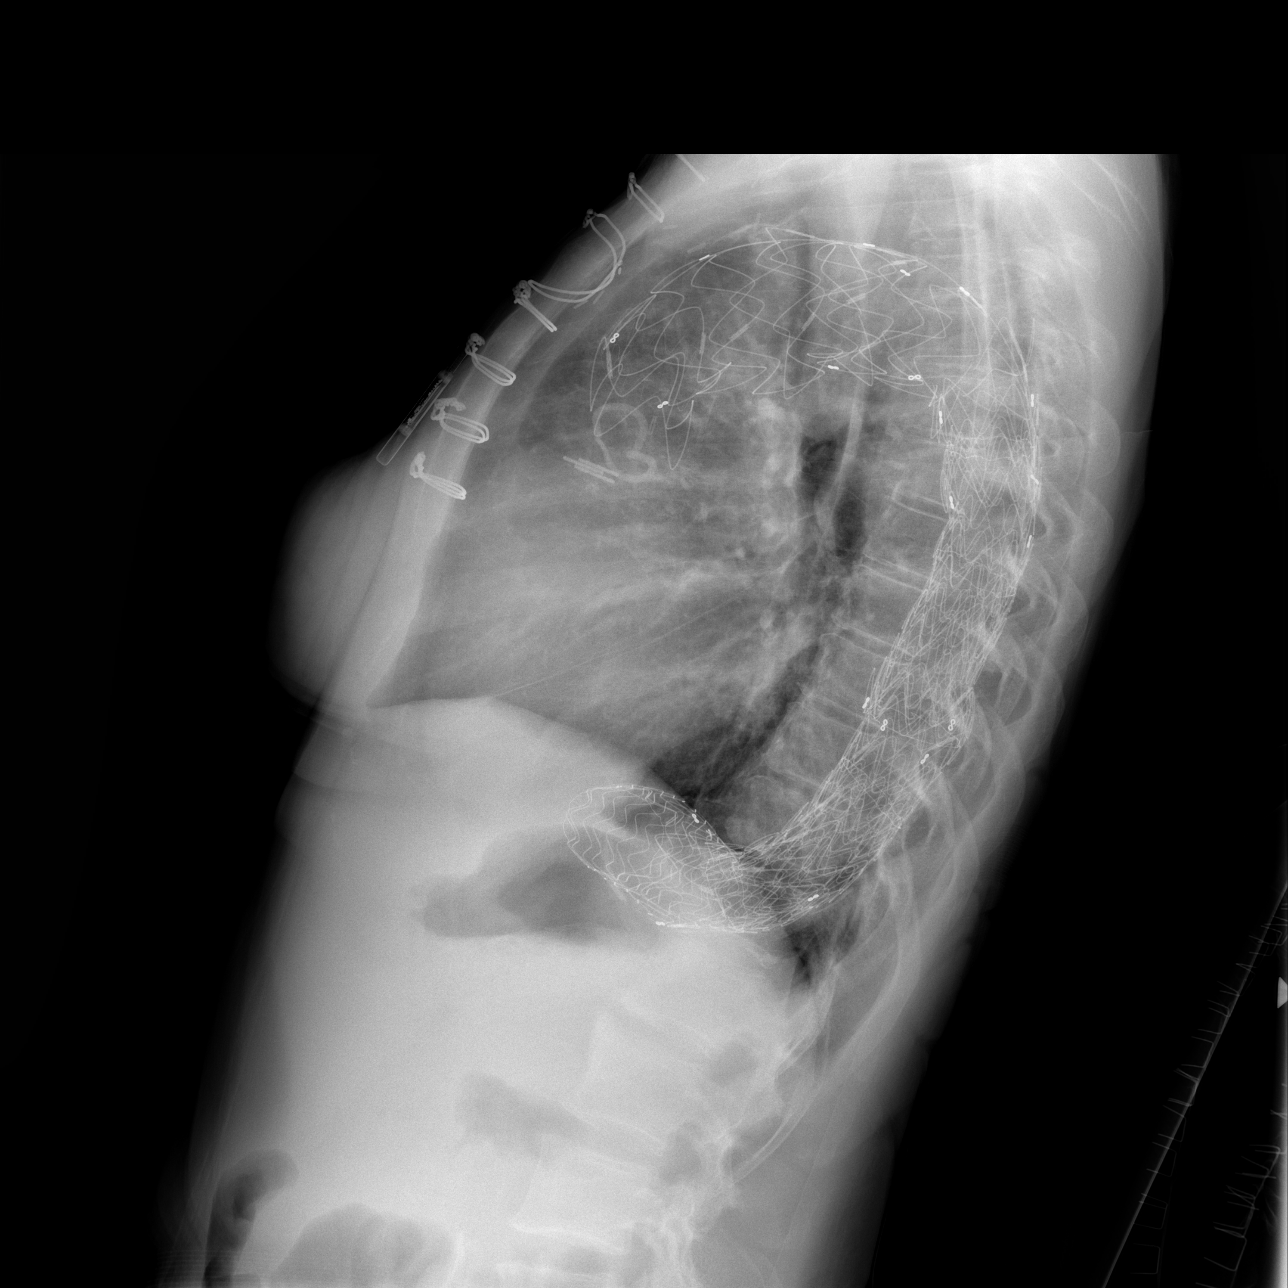

[w chest ap (1 of 2)]
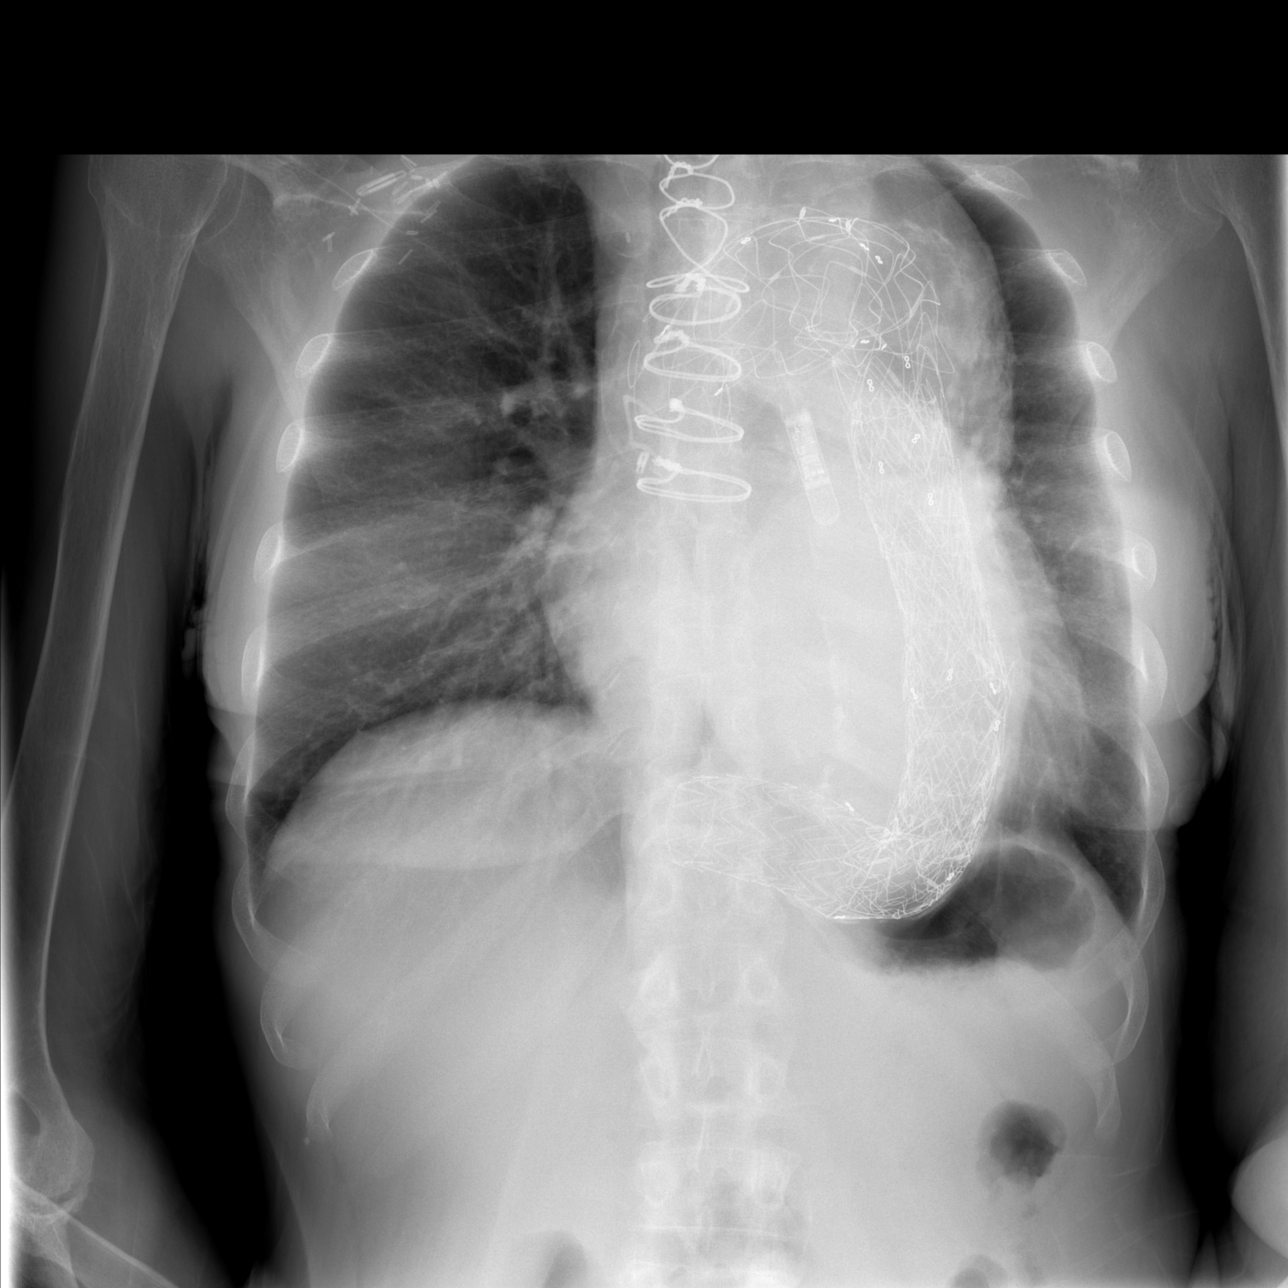

[w chest ap (2 of 2)]
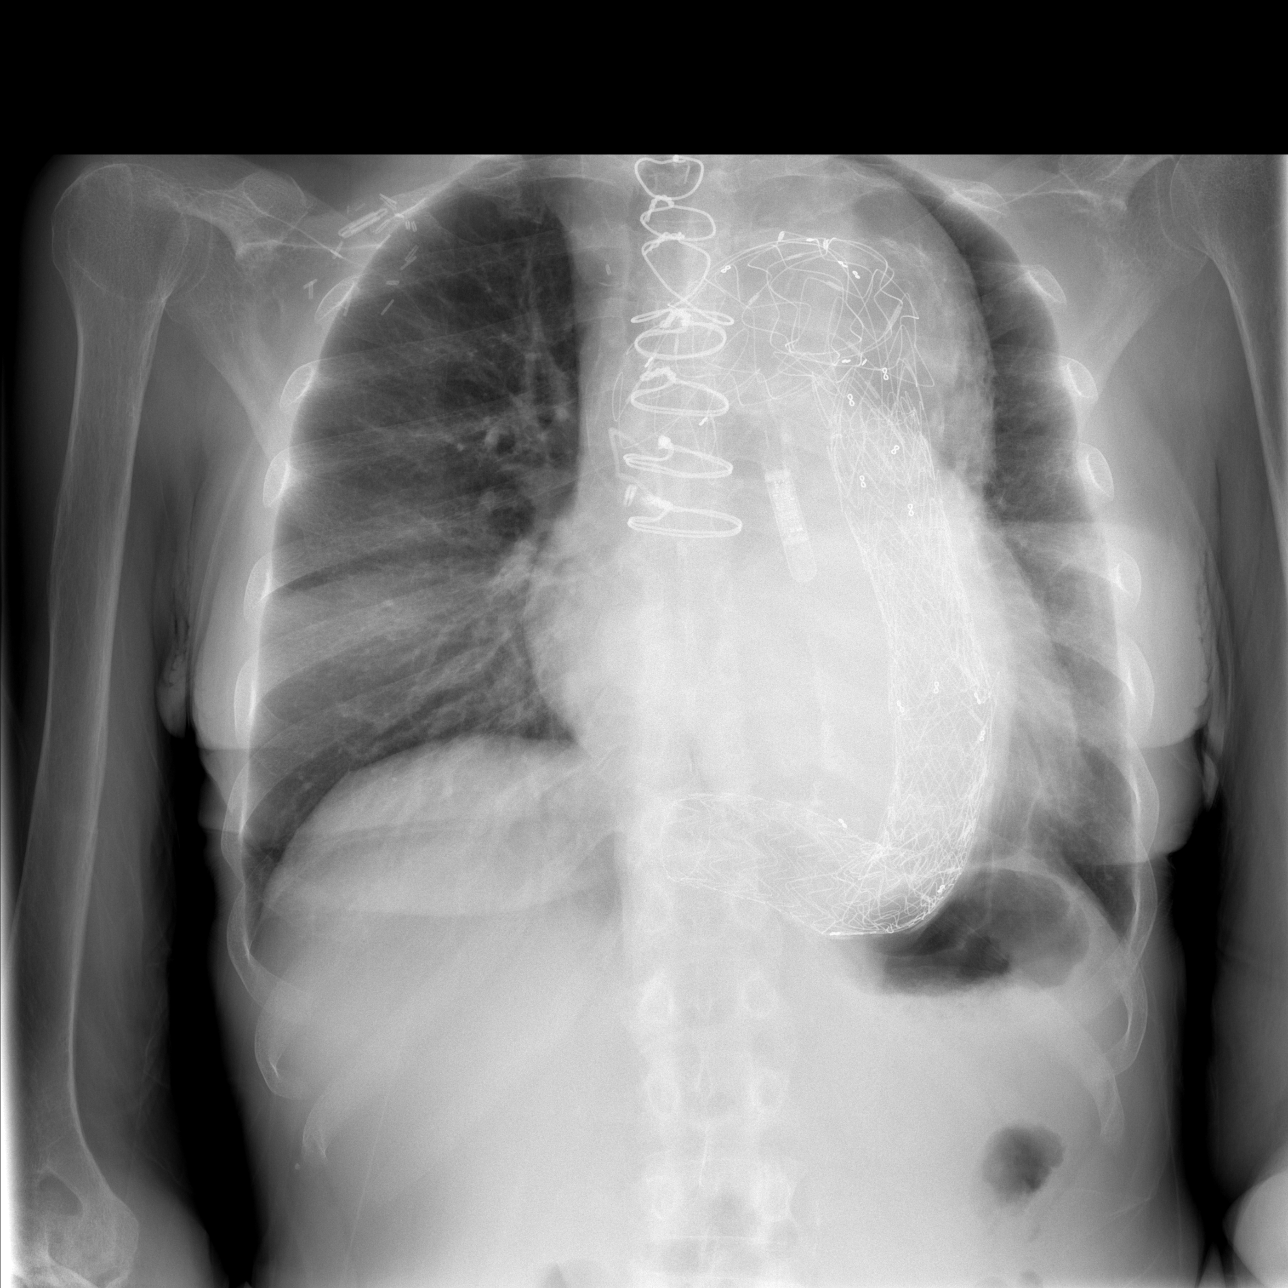

[3 of 3 positions shown; findings below may reference images not displayed]

FINDINGS: Cardiac shadow is again enlarged. Loop recorder is noted.
Postsurgical changes are seen. Thoracic aortic stent is again
identified. Increased soft tissue density is noted along the aortic
arch portion of the stent graft new from the prior exam. The patient
positioning somewhat different than that seen on the prior exam
although this increased soft tissue density is suspicious given the
patient's clinical history of back pain. CT is recommended for
further evaluation. The lungs are clear bilaterally. Postsurgical
changes in the right subclavicular area are noted. No bony
abnormality is seen.
IMPRESSION: Aortic stent graft is stable in appearance although increased soft
tissue density is noted superior to the aortic arch portion of the
graft. Given the patient's history of chest pain this is somewhat
suspicious for possible expanding aneurysm. CT of the chest (ideally
with contrast) is recommended for further evaluation.

These results will be called to the ordering clinician or
representative by the Radiologist Assistant, and communication
documented in the PACS or zVision Dashboard.

## 2019-11-15 IMAGING — DX DG CHEST 1V PORT
1 series · 1 of 1 positions shown · non-contrast
Comparison: Chest CT 09/08/2017.  Chest x-ray 09/07/2017.

CLINICAL DATA: Hypertension.  Asthma.

EXAM:
PORTABLE CHEST 1 VIEW

[chest ap]
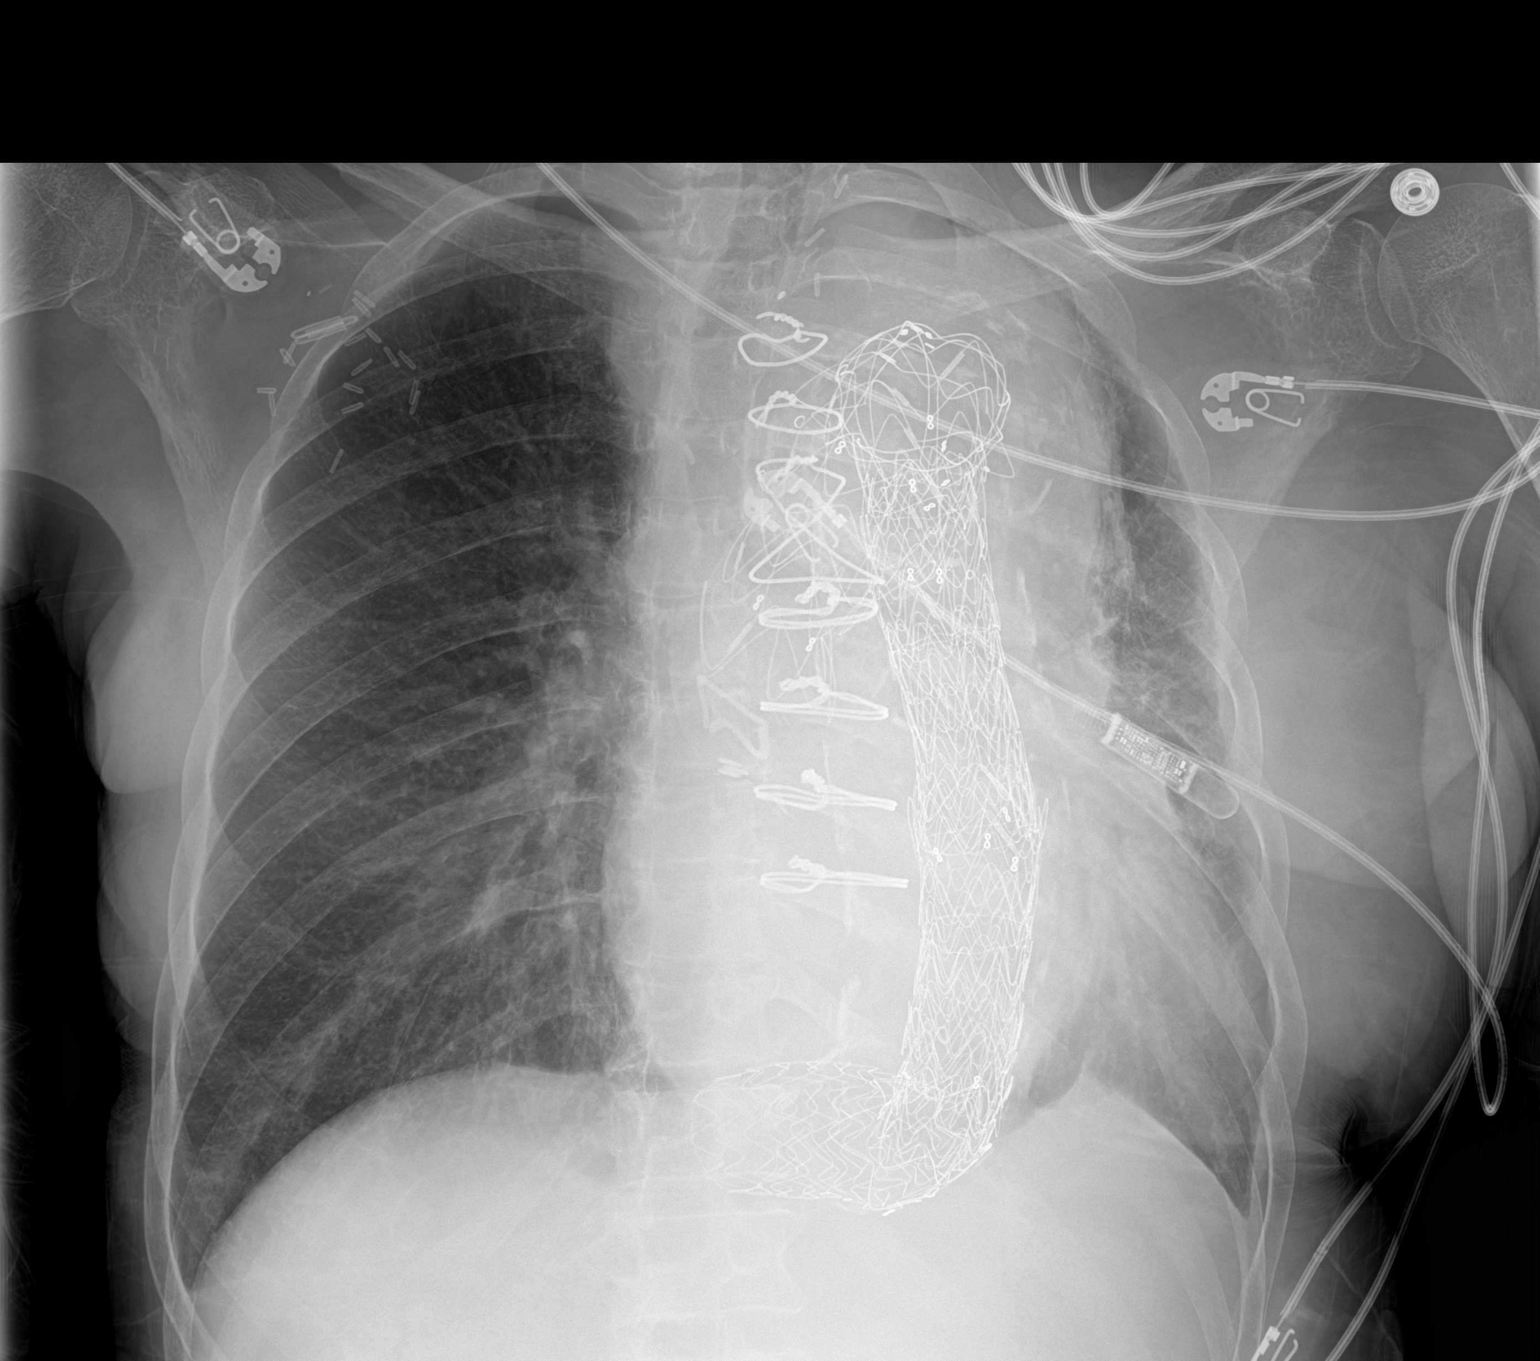

[1 of 1 positions shown; findings below may reference images not displayed]

FINDINGS: Prior CABG. Stable cardiomegaly. Thoracic aortic aneurysm with stent
graft again noted. This appears stable. Lungs are clear of acute
infiltrates. Left-sided pleural thickening versus pleural effusion.
No pneumothorax. Surgical clips right chest.
IMPRESSION: 1. Prior CABG. Stable cardiomegaly. Thoracic aortic aneurysm with
stent graft again noted. Mild left-sided pleural thickening versus
pleural scarring.

2.  No acute pulmonary disease.
